# Patient Record
Sex: Female | Born: 1955
Health system: Southern US, Community
[De-identification: ages and names within clinical notes are randomized; demographics above are authoritative.]

## PROBLEM LIST (undated history)

## (undated) DIAGNOSIS — Z9289 Personal history of other medical treatment: Secondary | ICD-10-CM

## (undated) DIAGNOSIS — C541 Malignant neoplasm of endometrium: Secondary | ICD-10-CM

## (undated) DIAGNOSIS — G4730 Sleep apnea, unspecified: Secondary | ICD-10-CM

## (undated) DIAGNOSIS — T4145XA Adverse effect of unspecified anesthetic, initial encounter: Secondary | ICD-10-CM

## (undated) DIAGNOSIS — J45909 Unspecified asthma, uncomplicated: Secondary | ICD-10-CM

## (undated) DIAGNOSIS — I10 Essential (primary) hypertension: Secondary | ICD-10-CM

## (undated) DIAGNOSIS — R0600 Dyspnea, unspecified: Secondary | ICD-10-CM

## (undated) DIAGNOSIS — Z9889 Other specified postprocedural states: Secondary | ICD-10-CM

## (undated) DIAGNOSIS — C539 Malignant neoplasm of cervix uteri, unspecified: Secondary | ICD-10-CM

## (undated) DIAGNOSIS — K436 Other and unspecified ventral hernia with obstruction, without gangrene: Secondary | ICD-10-CM

## (undated) DIAGNOSIS — M8580 Other specified disorders of bone density and structure, unspecified site: Secondary | ICD-10-CM

## (undated) DIAGNOSIS — K429 Umbilical hernia without obstruction or gangrene: Secondary | ICD-10-CM

## (undated) DIAGNOSIS — R112 Nausea with vomiting, unspecified: Secondary | ICD-10-CM

## (undated) DIAGNOSIS — Z923 Personal history of irradiation: Secondary | ICD-10-CM

## (undated) DIAGNOSIS — D649 Anemia, unspecified: Secondary | ICD-10-CM

## (undated) DIAGNOSIS — I509 Heart failure, unspecified: Secondary | ICD-10-CM

## (undated) DIAGNOSIS — E669 Obesity, unspecified: Secondary | ICD-10-CM

## (undated) DIAGNOSIS — E039 Hypothyroidism, unspecified: Secondary | ICD-10-CM

## (undated) DIAGNOSIS — Z85850 Personal history of malignant neoplasm of thyroid: Secondary | ICD-10-CM

## (undated) DIAGNOSIS — E785 Hyperlipidemia, unspecified: Secondary | ICD-10-CM

## (undated) DIAGNOSIS — T8859XA Other complications of anesthesia, initial encounter: Secondary | ICD-10-CM

## (undated) DIAGNOSIS — N189 Chronic kidney disease, unspecified: Secondary | ICD-10-CM

## (undated) DIAGNOSIS — M1990 Unspecified osteoarthritis, unspecified site: Secondary | ICD-10-CM

## (undated) HISTORY — DX: Hyperlipidemia, unspecified: E78.5

## (undated) HISTORY — DX: Personal history of other medical treatment: Z92.89

## (undated) HISTORY — DX: Malignant neoplasm of endometrium: C54.1

## (undated) HISTORY — DX: Other specified disorders of bone density and structure, unspecified site: M85.80

## (undated) HISTORY — DX: Anemia, unspecified: D64.9

## (undated) HISTORY — PX: THYROID SURGERY: SHX805

## (undated) HISTORY — DX: Malignant neoplasm of cervix uteri, unspecified: C53.9

## (undated) HISTORY — DX: Personal history of irradiation: Z92.3

## (undated) HISTORY — DX: Personal history of malignant neoplasm of thyroid: Z85.850

## (undated) HISTORY — DX: Umbilical hernia without obstruction or gangrene: K42.9

## (undated) HISTORY — PX: BONE MARROW BIOPSY: SHX199

## (undated) HISTORY — DX: Obesity, unspecified: E66.9

---
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
## 2000-08-25 ENCOUNTER — Emergency Department (HOSPITAL_COMMUNITY): Admission: EM | Admit: 2000-08-25 | Discharge: 2000-08-26 | Payer: Self-pay | Admitting: Emergency Medicine

## 2000-08-25 ENCOUNTER — Encounter: Payer: Self-pay | Admitting: Emergency Medicine

## 2000-09-06 ENCOUNTER — Encounter: Admission: RE | Admit: 2000-09-06 | Discharge: 2000-09-06 | Payer: Self-pay | Admitting: Internal Medicine

## 2000-09-06 ENCOUNTER — Encounter: Payer: Self-pay | Admitting: Internal Medicine

## 2000-10-26 ENCOUNTER — Other Ambulatory Visit: Admission: RE | Admit: 2000-10-26 | Discharge: 2000-10-26 | Payer: Self-pay | Admitting: Obstetrics and Gynecology

## 2000-11-15 ENCOUNTER — Inpatient Hospital Stay (HOSPITAL_COMMUNITY): Admission: RE | Admit: 2000-11-15 | Discharge: 2000-11-17 | Payer: Self-pay | Admitting: General Surgery

## 2001-04-19 ENCOUNTER — Emergency Department (HOSPITAL_COMMUNITY): Admission: EM | Admit: 2001-04-19 | Discharge: 2001-04-19 | Payer: Self-pay | Admitting: Emergency Medicine

## 2001-04-19 ENCOUNTER — Encounter: Payer: Self-pay | Admitting: Emergency Medicine

## 2001-09-13 ENCOUNTER — Encounter: Payer: Self-pay | Admitting: Internal Medicine

## 2001-09-13 ENCOUNTER — Encounter: Admission: RE | Admit: 2001-09-13 | Discharge: 2001-09-13 | Payer: Self-pay | Admitting: Internal Medicine

## 2001-10-25 HISTORY — PX: CHOLECYSTECTOMY: SHX55

## 2002-01-22 ENCOUNTER — Emergency Department (HOSPITAL_COMMUNITY): Admission: EM | Admit: 2002-01-22 | Discharge: 2002-01-22 | Payer: Self-pay | Admitting: *Deleted

## 2002-09-18 ENCOUNTER — Encounter: Admission: RE | Admit: 2002-09-18 | Discharge: 2002-09-18 | Payer: Self-pay | Admitting: Internal Medicine

## 2002-09-18 ENCOUNTER — Encounter: Payer: Self-pay | Admitting: Internal Medicine

## 2002-10-25 DIAGNOSIS — I509 Heart failure, unspecified: Secondary | ICD-10-CM

## 2002-10-25 HISTORY — DX: Heart failure, unspecified: I50.9

## 2003-09-20 ENCOUNTER — Encounter: Admission: RE | Admit: 2003-09-20 | Discharge: 2003-09-20 | Payer: Self-pay | Admitting: Internal Medicine

## 2003-10-26 HISTORY — PX: CARDIAC CATHETERIZATION: SHX172

## 2003-12-20 ENCOUNTER — Emergency Department (HOSPITAL_COMMUNITY): Admission: EM | Admit: 2003-12-20 | Discharge: 2003-12-20 | Payer: Self-pay | Admitting: Family Medicine

## 2004-01-07 DIAGNOSIS — Z9289 Personal history of other medical treatment: Secondary | ICD-10-CM

## 2004-01-07 HISTORY — DX: Personal history of other medical treatment: Z92.89

## 2004-01-29 ENCOUNTER — Other Ambulatory Visit: Admission: RE | Admit: 2004-01-29 | Discharge: 2004-01-29 | Payer: Self-pay | Admitting: Diagnostic Radiology

## 2004-02-10 ENCOUNTER — Ambulatory Visit (HOSPITAL_COMMUNITY): Admission: RE | Admit: 2004-02-10 | Discharge: 2004-02-10 | Payer: Self-pay | Admitting: Gastroenterology

## 2004-02-10 ENCOUNTER — Encounter (INDEPENDENT_AMBULATORY_CARE_PROVIDER_SITE_OTHER): Payer: Self-pay | Admitting: Specialist

## 2004-02-12 ENCOUNTER — Encounter (INDEPENDENT_AMBULATORY_CARE_PROVIDER_SITE_OTHER): Payer: Self-pay | Admitting: Specialist

## 2004-02-12 ENCOUNTER — Ambulatory Visit (HOSPITAL_COMMUNITY): Admission: RE | Admit: 2004-02-12 | Discharge: 2004-02-12 | Payer: Self-pay | Admitting: Gastroenterology

## 2004-02-19 ENCOUNTER — Ambulatory Visit (HOSPITAL_COMMUNITY): Admission: RE | Admit: 2004-02-19 | Discharge: 2004-02-19 | Payer: Self-pay | Admitting: Cardiology

## 2004-04-08 ENCOUNTER — Observation Stay (HOSPITAL_COMMUNITY): Admission: RE | Admit: 2004-04-08 | Discharge: 2004-04-09 | Payer: Self-pay | Admitting: General Surgery

## 2004-04-08 ENCOUNTER — Encounter (INDEPENDENT_AMBULATORY_CARE_PROVIDER_SITE_OTHER): Payer: Self-pay | Admitting: Specialist

## 2004-09-29 ENCOUNTER — Encounter: Admission: RE | Admit: 2004-09-29 | Discharge: 2004-09-29 | Payer: Self-pay | Admitting: Family Medicine

## 2005-03-17 ENCOUNTER — Other Ambulatory Visit: Admission: RE | Admit: 2005-03-17 | Discharge: 2005-03-17 | Payer: Self-pay | Admitting: Family Medicine

## 2005-10-26 ENCOUNTER — Encounter: Admission: RE | Admit: 2005-10-26 | Discharge: 2005-10-26 | Payer: Self-pay | Admitting: Family Medicine

## 2006-04-15 ENCOUNTER — Ambulatory Visit: Payer: Self-pay | Admitting: Family Medicine

## 2006-04-22 ENCOUNTER — Ambulatory Visit: Payer: Self-pay | Admitting: Family Medicine

## 2006-06-22 ENCOUNTER — Ambulatory Visit: Payer: Self-pay | Admitting: Family Medicine

## 2006-08-24 ENCOUNTER — Ambulatory Visit: Payer: Self-pay | Admitting: Family Medicine

## 2006-09-05 ENCOUNTER — Ambulatory Visit: Payer: Self-pay | Admitting: Family Medicine

## 2006-10-12 ENCOUNTER — Ambulatory Visit: Payer: Self-pay | Admitting: Family Medicine

## 2006-10-25 HISTORY — PX: COLONOSCOPY: SHX174

## 2006-10-28 ENCOUNTER — Encounter: Admission: RE | Admit: 2006-10-28 | Discharge: 2006-10-28 | Payer: Self-pay | Admitting: Family Medicine

## 2006-11-07 ENCOUNTER — Ambulatory Visit: Payer: Self-pay | Admitting: Family Medicine

## 2006-11-07 ENCOUNTER — Encounter: Admission: RE | Admit: 2006-11-07 | Discharge: 2006-11-07 | Payer: Self-pay | Admitting: Family Medicine

## 2006-12-07 ENCOUNTER — Ambulatory Visit: Payer: Self-pay | Admitting: Family Medicine

## 2006-12-07 ENCOUNTER — Encounter: Admission: RE | Admit: 2006-12-07 | Discharge: 2006-12-07 | Payer: Self-pay | Admitting: Physician Assistant

## 2007-01-03 ENCOUNTER — Ambulatory Visit: Payer: Self-pay | Admitting: Family Medicine

## 2007-02-07 ENCOUNTER — Ambulatory Visit: Payer: Self-pay | Admitting: Family Medicine

## 2007-04-03 ENCOUNTER — Ambulatory Visit: Payer: Self-pay | Admitting: Family Medicine

## 2007-05-22 ENCOUNTER — Encounter: Admission: RE | Admit: 2007-05-22 | Discharge: 2007-05-22 | Payer: Self-pay | Admitting: Family Medicine

## 2007-06-07 ENCOUNTER — Ambulatory Visit: Payer: Self-pay | Admitting: Family Medicine

## 2007-07-10 ENCOUNTER — Ambulatory Visit: Payer: Self-pay | Admitting: Family Medicine

## 2007-07-14 ENCOUNTER — Ambulatory Visit: Payer: Self-pay | Admitting: Family Medicine

## 2007-07-24 ENCOUNTER — Ambulatory Visit: Payer: Self-pay | Admitting: Family Medicine

## 2007-08-16 ENCOUNTER — Ambulatory Visit: Payer: Self-pay | Admitting: Family Medicine

## 2007-10-17 ENCOUNTER — Ambulatory Visit: Payer: Self-pay | Admitting: Family Medicine

## 2007-10-26 HISTORY — PX: TOTAL THYROIDECTOMY: SHX2547

## 2007-11-06 ENCOUNTER — Encounter: Admission: RE | Admit: 2007-11-06 | Discharge: 2007-11-06 | Payer: Self-pay | Admitting: Family Medicine

## 2007-12-21 ENCOUNTER — Ambulatory Visit: Payer: Self-pay | Admitting: Family Medicine

## 2008-02-15 ENCOUNTER — Ambulatory Visit: Payer: Self-pay | Admitting: Family Medicine

## 2008-02-15 ENCOUNTER — Other Ambulatory Visit: Admission: RE | Admit: 2008-02-15 | Discharge: 2008-02-15 | Payer: Self-pay | Admitting: Family Medicine

## 2008-02-19 ENCOUNTER — Encounter: Admission: RE | Admit: 2008-02-19 | Discharge: 2008-02-19 | Payer: Self-pay | Admitting: Family Medicine

## 2008-02-27 ENCOUNTER — Encounter: Admission: RE | Admit: 2008-02-27 | Discharge: 2008-02-27 | Payer: Self-pay | Admitting: Family Medicine

## 2008-03-12 ENCOUNTER — Encounter: Admission: RE | Admit: 2008-03-12 | Discharge: 2008-03-12 | Payer: Self-pay | Admitting: Family Medicine

## 2008-03-12 ENCOUNTER — Other Ambulatory Visit: Admission: RE | Admit: 2008-03-12 | Discharge: 2008-03-12 | Payer: Self-pay | Admitting: Interventional Radiology

## 2008-03-12 ENCOUNTER — Encounter (INDEPENDENT_AMBULATORY_CARE_PROVIDER_SITE_OTHER): Payer: Self-pay | Admitting: Interventional Radiology

## 2008-03-14 ENCOUNTER — Ambulatory Visit: Payer: Self-pay | Admitting: Family Medicine

## 2008-04-10 ENCOUNTER — Ambulatory Visit: Payer: Self-pay | Admitting: Family Medicine

## 2008-04-24 ENCOUNTER — Ambulatory Visit: Payer: Self-pay | Admitting: Internal Medicine

## 2008-04-25 LAB — CBC & DIFF AND RETIC
BASO%: 0.2 % (ref 0.0–2.0)
Basophils Absolute: 0 10*3/uL (ref 0.0–0.1)
EOS%: 2.1 % (ref 0.0–7.0)
Eosinophils Absolute: 0.1 10*3/uL (ref 0.0–0.5)
HCT: 32.3 % — ABNORMAL LOW (ref 34.8–46.6)
HGB: 11 g/dL — ABNORMAL LOW (ref 11.6–15.9)
IRF: 0.46 — ABNORMAL HIGH (ref 0.130–0.330)
LYMPH%: 19.6 % (ref 14.0–48.0)
MCH: 30.6 pg (ref 26.0–34.0)
MCHC: 34.2 g/dL (ref 32.0–36.0)
MCV: 89.6 fL (ref 81.0–101.0)
MONO#: 0.4 10*3/uL (ref 0.1–0.9)
MONO%: 5.6 % (ref 0.0–13.0)
NEUT#: 5 10*3/uL (ref 1.5–6.5)
NEUT%: 72.5 % (ref 39.6–76.8)
Platelets: 234 10*3/uL (ref 145–400)
RBC: 3.6 10*6/uL — ABNORMAL LOW (ref 3.70–5.32)
RDW: 13.3 % (ref 11.3–14.5)
RETIC #: 54.7 10*3/uL (ref 19.7–115.1)
Retic %: 1.5 % (ref 0.4–2.3)
WBC: 6.9 10*3/uL (ref 3.9–10.0)
lymph#: 1.4 10*3/uL (ref 0.9–3.3)

## 2008-04-29 LAB — IRON AND TIBC
%SAT: 22 % (ref 20–55)
Iron: 59 ug/dL (ref 42–145)
TIBC: 266 ug/dL (ref 250–470)
UIBC: 207 ug/dL

## 2008-04-29 LAB — PROTEIN ELECTROPHORESIS, SERUM
Albumin ELP: 54.5 % — ABNORMAL LOW (ref 55.8–66.1)
Alpha-1-Globulin: 4.9 % (ref 2.9–4.9)
Alpha-2-Globulin: 11.5 % (ref 7.1–11.8)
Beta 2: 6.6 % — ABNORMAL HIGH (ref 3.2–6.5)
Beta Globulin: 5.6 % (ref 4.7–7.2)
Gamma Globulin: 16.9 % (ref 11.1–18.8)
Total Protein, Serum Electrophoresis: 7.5 g/dL (ref 6.0–8.3)

## 2008-04-29 LAB — FOLATE RBC: RBC Folate: 884 ng/mL — ABNORMAL HIGH (ref 180–600)

## 2008-04-29 LAB — FERRITIN: Ferritin: 118 ng/mL (ref 10–291)

## 2008-04-29 LAB — VITAMIN B12: Vitamin B-12: 412 pg/mL (ref 211–911)

## 2008-05-09 LAB — CBC WITH DIFFERENTIAL/PLATELET
BASO%: 0.2 % (ref 0.0–2.0)
Basophils Absolute: 0 10*3/uL (ref 0.0–0.1)
EOS%: 1.6 % (ref 0.0–7.0)
Eosinophils Absolute: 0.1 10*3/uL (ref 0.0–0.5)
HCT: 30.8 % — ABNORMAL LOW (ref 34.8–46.6)
HGB: 10.7 g/dL — ABNORMAL LOW (ref 11.6–15.9)
LYMPH%: 18.2 % (ref 14.0–48.0)
MCH: 30.7 pg (ref 26.0–34.0)
MCHC: 34.7 g/dL (ref 32.0–36.0)
MCV: 88.6 fL (ref 81.0–101.0)
MONO#: 0.4 10*3/uL (ref 0.1–0.9)
MONO%: 4.7 % (ref 0.0–13.0)
NEUT#: 6.4 10*3/uL (ref 1.5–6.5)
NEUT%: 75.3 % (ref 39.6–76.8)
Platelets: 207 10*3/uL (ref 145–400)
RBC: 3.48 10*6/uL — ABNORMAL LOW (ref 3.70–5.32)
RDW: 13.5 % (ref 11.3–14.5)
WBC: 8.5 10*3/uL (ref 3.9–10.0)
lymph#: 1.5 10*3/uL (ref 0.9–3.3)

## 2008-05-14 ENCOUNTER — Ambulatory Visit: Payer: Self-pay | Admitting: Family Medicine

## 2008-07-08 ENCOUNTER — Ambulatory Visit: Payer: Self-pay | Admitting: Internal Medicine

## 2008-07-10 LAB — CBC WITH DIFFERENTIAL/PLATELET
BASO%: 0.3 % (ref 0.0–2.0)
Basophils Absolute: 0 10*3/uL (ref 0.0–0.1)
EOS%: 2.6 % (ref 0.0–7.0)
Eosinophils Absolute: 0.2 10*3/uL (ref 0.0–0.5)
HCT: 32.8 % — ABNORMAL LOW (ref 34.8–46.6)
HGB: 11.3 g/dL — ABNORMAL LOW (ref 11.6–15.9)
LYMPH%: 19.1 % (ref 14.0–48.0)
MCH: 31.3 pg (ref 26.0–34.0)
MCHC: 34.5 g/dL (ref 32.0–36.0)
MCV: 90.9 fL (ref 81.0–101.0)
MONO#: 0.4 10*3/uL (ref 0.1–0.9)
MONO%: 4.8 % (ref 0.0–13.0)
NEUT#: 5.7 10*3/uL (ref 1.5–6.5)
NEUT%: 73.2 % (ref 39.6–76.8)
Platelets: 188 10*3/uL (ref 145–400)
RBC: 3.6 10*6/uL — ABNORMAL LOW (ref 3.70–5.32)
RDW: 13.4 % (ref 11.3–14.5)
WBC: 7.7 10*3/uL (ref 3.9–10.0)
lymph#: 1.5 10*3/uL (ref 0.9–3.3)

## 2008-07-12 ENCOUNTER — Encounter (INDEPENDENT_AMBULATORY_CARE_PROVIDER_SITE_OTHER): Payer: Self-pay | Admitting: General Surgery

## 2008-07-12 ENCOUNTER — Ambulatory Visit (HOSPITAL_COMMUNITY): Admission: RE | Admit: 2008-07-12 | Discharge: 2008-07-13 | Payer: Self-pay | Admitting: General Surgery

## 2008-09-04 ENCOUNTER — Ambulatory Visit: Payer: Self-pay | Admitting: Family Medicine

## 2008-10-01 ENCOUNTER — Ambulatory Visit: Payer: Self-pay | Admitting: Internal Medicine

## 2008-10-03 LAB — IRON AND TIBC
%SAT: 16 % — ABNORMAL LOW (ref 20–55)
Iron: 39 ug/dL — ABNORMAL LOW (ref 42–145)
TIBC: 246 ug/dL — ABNORMAL LOW (ref 250–470)
UIBC: 207 ug/dL

## 2008-10-03 LAB — CBC WITH DIFFERENTIAL/PLATELET
BASO%: 0.3 % (ref 0.0–2.0)
Basophils Absolute: 0 10*3/uL (ref 0.0–0.1)
EOS%: 1.7 % (ref 0.0–7.0)
Eosinophils Absolute: 0.1 10*3/uL (ref 0.0–0.5)
HCT: 31.6 % — ABNORMAL LOW (ref 34.8–46.6)
HGB: 10.7 g/dL — ABNORMAL LOW (ref 11.6–15.9)
LYMPH%: 20 % (ref 14.0–48.0)
MCH: 31.8 pg (ref 26.0–34.0)
MCHC: 33.9 g/dL (ref 32.0–36.0)
MCV: 93.8 fL (ref 81.0–101.0)
MONO#: 0.5 10*3/uL (ref 0.1–0.9)
MONO%: 5.5 % (ref 0.0–13.0)
NEUT#: 6.2 10*3/uL (ref 1.5–6.5)
NEUT%: 72.5 % (ref 39.6–76.8)
Platelets: 205 10*3/uL (ref 145–400)
RBC: 3.37 10*6/uL — ABNORMAL LOW (ref 3.70–5.32)
RDW: 13.8 % (ref 11.3–14.5)
WBC: 8.5 10*3/uL (ref 3.9–10.0)
lymph#: 1.7 10*3/uL (ref 0.9–3.3)

## 2008-10-03 LAB — FERRITIN: Ferritin: 198 ng/mL (ref 10–291)

## 2008-11-05 ENCOUNTER — Ambulatory Visit: Payer: Self-pay | Admitting: Family Medicine

## 2008-11-12 ENCOUNTER — Encounter: Admission: RE | Admit: 2008-11-12 | Discharge: 2008-11-12 | Payer: Self-pay | Admitting: Family Medicine

## 2009-01-02 ENCOUNTER — Ambulatory Visit: Payer: Self-pay | Admitting: Family Medicine

## 2009-01-03 ENCOUNTER — Ambulatory Visit: Payer: Self-pay | Admitting: Internal Medicine

## 2009-01-07 LAB — CBC WITH DIFFERENTIAL/PLATELET
BASO%: 0.2 % (ref 0.0–2.0)
Basophils Absolute: 0 10*3/uL (ref 0.0–0.1)
EOS%: 1.3 % (ref 0.0–7.0)
Eosinophils Absolute: 0.1 10*3/uL (ref 0.0–0.5)
HCT: 31.6 % — ABNORMAL LOW (ref 34.8–46.6)
HGB: 10.9 g/dL — ABNORMAL LOW (ref 11.6–15.9)
LYMPH%: 22 % (ref 14.0–49.7)
MCH: 32.2 pg (ref 25.1–34.0)
MCHC: 34.5 g/dL (ref 31.5–36.0)
MCV: 93.5 fL (ref 79.5–101.0)
MONO#: 0.4 10*3/uL (ref 0.1–0.9)
MONO%: 4.8 % (ref 0.0–14.0)
NEUT#: 5.6 10*3/uL (ref 1.5–6.5)
NEUT%: 71.7 % (ref 38.4–76.8)
Platelets: 225 10*3/uL (ref 145–400)
RBC: 3.38 10*6/uL — ABNORMAL LOW (ref 3.70–5.45)
RDW: 13.4 % (ref 11.2–14.5)
WBC: 7.8 10*3/uL (ref 3.9–10.3)
lymph#: 1.7 10*3/uL (ref 0.9–3.3)

## 2009-01-08 LAB — FERRITIN: Ferritin: 191 ng/mL (ref 10–291)

## 2009-01-08 LAB — FOLATE RBC: RBC Folate: 1548 ng/mL — ABNORMAL HIGH (ref 180–600)

## 2009-01-29 ENCOUNTER — Ambulatory Visit: Payer: Self-pay | Admitting: Family Medicine

## 2009-02-19 ENCOUNTER — Ambulatory Visit: Payer: Self-pay | Admitting: Family Medicine

## 2009-04-22 ENCOUNTER — Ambulatory Visit: Payer: Self-pay | Admitting: Family Medicine

## 2009-05-30 ENCOUNTER — Ambulatory Visit: Payer: Self-pay | Admitting: Internal Medicine

## 2009-07-07 ENCOUNTER — Ambulatory Visit: Payer: Self-pay | Admitting: Internal Medicine

## 2009-07-09 LAB — COMPREHENSIVE METABOLIC PANEL
ALT: 15 U/L (ref 0–35)
AST: 16 U/L (ref 0–37)
Albumin: 4.1 g/dL (ref 3.5–5.2)
Alkaline Phosphatase: 93 U/L (ref 39–117)
BUN: 17 mg/dL (ref 6–23)
CO2: 26 mEq/L (ref 19–32)
Calcium: 8.7 mg/dL (ref 8.4–10.5)
Chloride: 104 mEq/L (ref 96–112)
Creatinine, Ser: 0.94 mg/dL (ref 0.40–1.20)
Glucose, Bld: 108 mg/dL — ABNORMAL HIGH (ref 70–99)
Potassium: 4.4 mEq/L (ref 3.5–5.3)
Sodium: 140 mEq/L (ref 135–145)
Total Bilirubin: 0.4 mg/dL (ref 0.3–1.2)
Total Protein: 7.1 g/dL (ref 6.0–8.3)

## 2009-07-09 LAB — CBC WITH DIFFERENTIAL/PLATELET
BASO%: 0.2 % (ref 0.0–2.0)
Basophils Absolute: 0 10*3/uL (ref 0.0–0.1)
EOS%: 2.2 % (ref 0.0–7.0)
Eosinophils Absolute: 0.2 10*3/uL (ref 0.0–0.5)
HCT: 31.8 % — ABNORMAL LOW (ref 34.8–46.6)
HGB: 11 g/dL — ABNORMAL LOW (ref 11.6–15.9)
LYMPH%: 20.7 % (ref 14.0–49.7)
MCH: 32 pg (ref 25.1–34.0)
MCHC: 34.5 g/dL (ref 31.5–36.0)
MCV: 92.9 fL (ref 79.5–101.0)
MONO#: 0.4 10*3/uL (ref 0.1–0.9)
MONO%: 4.9 % (ref 0.0–14.0)
NEUT#: 5.6 10*3/uL (ref 1.5–6.5)
NEUT%: 72 % (ref 38.4–76.8)
Platelets: 230 10*3/uL (ref 145–400)
RBC: 3.42 10*6/uL — ABNORMAL LOW (ref 3.70–5.45)
RDW: 13.3 % (ref 11.2–14.5)
WBC: 7.7 10*3/uL (ref 3.9–10.3)
lymph#: 1.6 10*3/uL (ref 0.9–3.3)

## 2009-07-09 LAB — IRON AND TIBC
%SAT: 22 % (ref 20–55)
Iron: 57 ug/dL (ref 42–145)
TIBC: 256 ug/dL (ref 250–470)
UIBC: 199 ug/dL

## 2009-07-09 LAB — LACTATE DEHYDROGENASE: LDH: 212 U/L (ref 94–250)

## 2009-07-09 LAB — FERRITIN: Ferritin: 275 ng/mL (ref 10–291)

## 2009-07-23 ENCOUNTER — Ambulatory Visit: Payer: Self-pay | Admitting: Family Medicine

## 2009-07-30 ENCOUNTER — Ambulatory Visit (HOSPITAL_COMMUNITY): Admission: RE | Admit: 2009-07-30 | Discharge: 2009-07-30 | Payer: Self-pay | Admitting: Internal Medicine

## 2009-07-30 ENCOUNTER — Encounter (INDEPENDENT_AMBULATORY_CARE_PROVIDER_SITE_OTHER): Payer: Self-pay | Admitting: Interventional Radiology

## 2009-08-06 ENCOUNTER — Ambulatory Visit: Payer: Self-pay | Admitting: Internal Medicine

## 2009-08-06 LAB — CBC WITH DIFFERENTIAL/PLATELET
BASO%: 0.2 % (ref 0.0–2.0)
Basophils Absolute: 0 10*3/uL (ref 0.0–0.1)
EOS%: 1.7 % (ref 0.0–7.0)
Eosinophils Absolute: 0.1 10*3/uL (ref 0.0–0.5)
HCT: 29.9 % — ABNORMAL LOW (ref 34.8–46.6)
HGB: 10.5 g/dL — ABNORMAL LOW (ref 11.6–15.9)
LYMPH%: 21.2 % (ref 14.0–49.7)
MCH: 32.7 pg (ref 25.1–34.0)
MCHC: 35.2 g/dL (ref 31.5–36.0)
MCV: 92.9 fL (ref 79.5–101.0)
MONO#: 0.4 10*3/uL (ref 0.1–0.9)
MONO%: 5.5 % (ref 0.0–14.0)
NEUT#: 5.1 10*3/uL (ref 1.5–6.5)
NEUT%: 71.4 % (ref 38.4–76.8)
Platelets: 174 10*3/uL (ref 145–400)
RBC: 3.22 10*6/uL — ABNORMAL LOW (ref 3.70–5.45)
RDW: 12.9 % (ref 11.2–14.5)
WBC: 7.2 10*3/uL (ref 3.9–10.3)
lymph#: 1.5 10*3/uL (ref 0.9–3.3)

## 2009-11-13 ENCOUNTER — Encounter: Admission: RE | Admit: 2009-11-13 | Discharge: 2009-11-13 | Payer: Self-pay | Admitting: Family Medicine

## 2009-11-13 ENCOUNTER — Ambulatory Visit: Payer: Self-pay | Admitting: Family Medicine

## 2010-01-08 ENCOUNTER — Ambulatory Visit: Payer: Self-pay | Admitting: Family Medicine

## 2010-01-26 ENCOUNTER — Ambulatory Visit: Payer: Self-pay | Admitting: Internal Medicine

## 2010-01-28 LAB — CBC WITH DIFFERENTIAL/PLATELET
BASO%: 0.3 % (ref 0.0–2.0)
Basophils Absolute: 0 10*3/uL (ref 0.0–0.1)
EOS%: 2.4 % (ref 0.0–7.0)
Eosinophils Absolute: 0.2 10*3/uL (ref 0.0–0.5)
HCT: 32.8 % — ABNORMAL LOW (ref 34.8–46.6)
HGB: 11.4 g/dL — ABNORMAL LOW (ref 11.6–15.9)
LYMPH%: 20 % (ref 14.0–49.7)
MCH: 32.2 pg (ref 25.1–34.0)
MCHC: 34.6 g/dL (ref 31.5–36.0)
MCV: 93.2 fL (ref 79.5–101.0)
MONO#: 0.4 10*3/uL (ref 0.1–0.9)
MONO%: 4.9 % (ref 0.0–14.0)
NEUT#: 5.7 10*3/uL (ref 1.5–6.5)
NEUT%: 72.4 % (ref 38.4–76.8)
Platelets: 204 10*3/uL (ref 145–400)
RBC: 3.53 10*6/uL — ABNORMAL LOW (ref 3.70–5.45)
RDW: 13.6 % (ref 11.2–14.5)
WBC: 7.8 10*3/uL (ref 3.9–10.3)
lymph#: 1.6 10*3/uL (ref 0.9–3.3)

## 2010-01-29 LAB — ERYTHROPOIETIN: Erythropoietin: 7.9 m[IU]/mL (ref 2.6–34.0)

## 2010-01-29 LAB — COMPREHENSIVE METABOLIC PANEL
ALT: 13 U/L (ref 0–35)
AST: 14 U/L (ref 0–37)
Albumin: 4.3 g/dL (ref 3.5–5.2)
Alkaline Phosphatase: 93 U/L (ref 39–117)
BUN: 29 mg/dL — ABNORMAL HIGH (ref 6–23)
CO2: 24 mEq/L (ref 19–32)
Calcium: 9.4 mg/dL (ref 8.4–10.5)
Chloride: 103 mEq/L (ref 96–112)
Creatinine, Ser: 1.08 mg/dL (ref 0.40–1.20)
Glucose, Bld: 111 mg/dL — ABNORMAL HIGH (ref 70–99)
Potassium: 4.1 mEq/L (ref 3.5–5.3)
Sodium: 140 mEq/L (ref 135–145)
Total Bilirubin: 0.3 mg/dL (ref 0.3–1.2)
Total Protein: 7.2 g/dL (ref 6.0–8.3)

## 2010-01-29 LAB — IRON AND TIBC
%SAT: 21 % (ref 20–55)
Iron: 57 ug/dL (ref 42–145)
TIBC: 272 ug/dL (ref 250–470)
UIBC: 215 ug/dL

## 2010-01-29 LAB — FERRITIN: Ferritin: 211 ng/mL (ref 10–291)

## 2010-01-29 LAB — LACTATE DEHYDROGENASE: LDH: 185 U/L (ref 94–250)

## 2010-01-29 LAB — FOLATE: Folate: >20 ng/mL

## 2010-01-29 LAB — VITAMIN B12: Vitamin B-12: 403 pg/mL (ref 211–911)

## 2010-02-26 ENCOUNTER — Ambulatory Visit: Payer: Self-pay | Admitting: Physician Assistant

## 2010-07-28 ENCOUNTER — Ambulatory Visit: Payer: Self-pay | Admitting: Internal Medicine

## 2010-07-30 LAB — IRON AND TIBC
%SAT: 14 % — ABNORMAL LOW (ref 20–55)
Iron: 37 ug/dL — ABNORMAL LOW (ref 42–145)
TIBC: 261 ug/dL (ref 250–470)
UIBC: 224 ug/dL

## 2010-07-30 LAB — CBC & DIFF AND RETIC
BASO%: 0.2 % (ref 0.0–2.0)
Basophils Absolute: 0 10*3/uL (ref 0.0–0.1)
EOS%: 1.1 % (ref 0.0–7.0)
Eosinophils Absolute: 0.1 10*3/uL (ref 0.0–0.5)
HCT: 32 % — ABNORMAL LOW (ref 34.8–46.6)
HGB: 10.5 g/dL — ABNORMAL LOW (ref 11.6–15.9)
Immature Retic Fract: 13.8 % — ABNORMAL HIGH (ref 0.00–10.70)
LYMPH%: 22 % (ref 14.0–49.7)
MCH: 31.1 pg (ref 25.1–34.0)
MCHC: 32.8 g/dL (ref 31.5–36.0)
MCV: 94.7 fL (ref 79.5–101.0)
MONO#: 0.4 10*3/uL (ref 0.1–0.9)
MONO%: 5.5 % (ref 0.0–14.0)
NEUT#: 4.5 10*3/uL (ref 1.5–6.5)
NEUT%: 71.2 % (ref 38.4–76.8)
Platelets: 172 10*3/uL (ref 145–400)
RBC: 3.38 10*6/uL — ABNORMAL LOW (ref 3.70–5.45)
RDW: 13.6 % (ref 11.2–14.5)
Retic %: 1.62 % — ABNORMAL HIGH (ref 0.50–1.50)
Retic Ct Abs: 54.76 10*3/uL (ref 18.30–72.70)
WBC: 6.3 10*3/uL (ref 3.9–10.3)
lymph#: 1.4 10*3/uL (ref 0.9–3.3)

## 2010-07-30 LAB — FERRITIN: Ferritin: 138 ng/mL (ref 10–291)

## 2010-07-30 LAB — FOLATE: Folate: 14.6 ng/mL

## 2010-07-30 LAB — VITAMIN B12: Vitamin B-12: 380 pg/mL (ref 211–911)

## 2010-09-03 ENCOUNTER — Emergency Department (HOSPITAL_COMMUNITY): Admission: EM | Admit: 2010-09-03 | Discharge: 2010-09-03 | Payer: Self-pay | Admitting: Family Medicine

## 2010-11-15 ENCOUNTER — Encounter: Payer: Self-pay | Admitting: Family Medicine

## 2010-11-16 ENCOUNTER — Encounter
Admission: RE | Admit: 2010-11-16 | Discharge: 2010-11-16 | Payer: Self-pay | Source: Home / Self Care | Attending: Family Medicine | Admitting: Family Medicine

## 2010-12-10 ENCOUNTER — Ambulatory Visit (INDEPENDENT_AMBULATORY_CARE_PROVIDER_SITE_OTHER): Payer: BC Managed Care – PPO

## 2010-12-10 ENCOUNTER — Inpatient Hospital Stay (INDEPENDENT_AMBULATORY_CARE_PROVIDER_SITE_OTHER)
Admission: RE | Admit: 2010-12-10 | Discharge: 2010-12-10 | Disposition: A | Discharge status: Home / Self Care | Payer: BC Managed Care – PPO | Source: Ambulatory Visit | Attending: Family Medicine | Admitting: Family Medicine

## 2010-12-10 DIAGNOSIS — J189 Pneumonia, unspecified organism: Secondary | ICD-10-CM

## 2010-12-10 DIAGNOSIS — R062 Wheezing: Secondary | ICD-10-CM

## 2010-12-15 ENCOUNTER — Ambulatory Visit (INDEPENDENT_AMBULATORY_CARE_PROVIDER_SITE_OTHER): Payer: BC Managed Care – PPO | Admitting: Family Medicine

## 2010-12-15 DIAGNOSIS — J189 Pneumonia, unspecified organism: Secondary | ICD-10-CM

## 2011-01-27 ENCOUNTER — Other Ambulatory Visit: Payer: Self-pay | Admitting: Internal Medicine

## 2011-01-27 ENCOUNTER — Encounter (HOSPITAL_BASED_OUTPATIENT_CLINIC_OR_DEPARTMENT_OTHER): Payer: BC Managed Care – PPO | Admitting: Internal Medicine

## 2011-01-27 DIAGNOSIS — D509 Iron deficiency anemia, unspecified: Secondary | ICD-10-CM

## 2011-01-27 DIAGNOSIS — D649 Anemia, unspecified: Secondary | ICD-10-CM

## 2011-01-27 LAB — IRON AND TIBC
%SAT: 20 % (ref 20–55)
Iron: 51 ug/dL (ref 42–145)
TIBC: 260 ug/dL (ref 250–470)
UIBC: 209 ug/dL

## 2011-01-27 LAB — CBC & DIFF AND RETIC
BASO%: 0.2 % (ref 0.0–2.0)
Basophils Absolute: 0 10*3/uL (ref 0.0–0.1)
EOS%: 1.9 % (ref 0.0–7.0)
Eosinophils Absolute: 0.2 10*3/uL (ref 0.0–0.5)
HCT: 33.2 % — ABNORMAL LOW (ref 34.8–46.6)
HGB: 10.7 g/dL — ABNORMAL LOW (ref 11.6–15.9)
Immature Retic Fract: 9.1 % (ref 0.00–10.70)
LYMPH%: 18.1 % (ref 14.0–49.7)
MCH: 30.1 pg (ref 25.1–34.0)
MCHC: 32.2 g/dL (ref 31.5–36.0)
MCV: 93.3 fL (ref 79.5–101.0)
MONO#: 0.5 10*3/uL (ref 0.1–0.9)
MONO%: 5 % (ref 0.0–14.0)
NEUT#: 6.8 10*3/uL — ABNORMAL HIGH (ref 1.5–6.5)
NEUT%: 74.8 % (ref 38.4–76.8)
Platelets: 227 10*3/uL (ref 145–400)
RBC: 3.56 10*6/uL — ABNORMAL LOW (ref 3.70–5.45)
RDW: 13.2 % (ref 11.2–14.5)
Retic %: 1.33 % (ref 0.50–1.50)
Retic Ct Abs: 47.35 10*3/uL (ref 18.30–72.70)
WBC: 9.2 10*3/uL (ref 3.9–10.3)
lymph#: 1.7 10*3/uL (ref 0.9–3.3)
nRBC: 0 % (ref 0–0)

## 2011-01-27 LAB — FERRITIN: Ferritin: 242 ng/mL (ref 10–291)

## 2011-01-28 LAB — CBC
HCT: 33.8 % — ABNORMAL LOW (ref 36.0–46.0)
Hemoglobin: 11.6 g/dL — ABNORMAL LOW (ref 12.0–15.0)
MCHC: 34.3 g/dL (ref 30.0–36.0)
MCV: 93.3 fL (ref 78.0–100.0)
Platelets: 237 10*3/uL (ref 150–400)
RBC: 3.62 MIL/uL — ABNORMAL LOW (ref 3.87–5.11)
RDW: 12.6 % (ref 11.5–15.5)
WBC: 8.7 10*3/uL (ref 4.0–10.5)

## 2011-01-28 LAB — CHROMOSOME ANALYSIS, BONE MARROW

## 2011-01-28 LAB — APTT: aPTT: 29 seconds (ref 24–37)

## 2011-01-28 LAB — PROTIME-INR
INR: 0.96 (ref 0.00–1.49)
Prothrombin Time: 12.7 seconds (ref 11.6–15.2)

## 2011-01-28 LAB — BONE MARROW EXAM: Bone Marrow Exam: 352

## 2011-02-03 ENCOUNTER — Ambulatory Visit (INDEPENDENT_AMBULATORY_CARE_PROVIDER_SITE_OTHER): Payer: BC Managed Care – PPO | Admitting: Family Medicine

## 2011-02-03 ENCOUNTER — Encounter (HOSPITAL_BASED_OUTPATIENT_CLINIC_OR_DEPARTMENT_OTHER): Payer: BC Managed Care – PPO | Admitting: Internal Medicine

## 2011-02-03 DIAGNOSIS — L738 Other specified follicular disorders: Secondary | ICD-10-CM

## 2011-02-03 DIAGNOSIS — E039 Hypothyroidism, unspecified: Secondary | ICD-10-CM

## 2011-02-03 DIAGNOSIS — R059 Cough, unspecified: Secondary | ICD-10-CM

## 2011-02-03 DIAGNOSIS — R05 Cough: Secondary | ICD-10-CM

## 2011-02-03 DIAGNOSIS — Z79899 Other long term (current) drug therapy: Secondary | ICD-10-CM

## 2011-02-03 DIAGNOSIS — D509 Iron deficiency anemia, unspecified: Secondary | ICD-10-CM

## 2011-03-09 NOTE — Op Note (Signed)
NAME:  Bridget Lamb, Bridget Lamb           ACCOUNT NO.:  [id number]   MEDICAL RECORD NO.:  [id number]          PATIENT TYPE:  OIB   LOCATION:  5158                         FACILITY:  [facility]   PHYSICIAN:  Ollen Gross. Vernell Morgans, M.D. DATE OF BIRTH:  Feb 24, 1956   DATE OF PROCEDURE:  07/12/2008  DATE OF DISCHARGE:                               OPERATIVE REPORT   PREOPERATIVE DIAGNOSIS:  Left thyroid nodule.   POSTOPERATIVE DIAGNOSIS:  Left thyroid nodule.   PROCEDURE:  Left thyroid lobectomy.   SURGEON:  Ollen Gross. Vernell Morgans, MD   ASSISTANT:  Velora Heckler, MD   ANESTHESIA:  General endotracheal.   PROCEDURE:  After informed consent was obtained, the patient was brought  to the operating room and placed in a supine position on the operating  room table.  After adequate induction of general anesthesia, a roll was  placed behind the patient's shoulders to extend the neck slightly.  The  neck was then prepped with Betadine and draped in usual sterile manner.  A transversely oriented incision was made through her old surgical  incision scar with a 15 blade knife.  This incision was carried down  through the skin and subcutaneous tissue sharply with the electrocautery  and through the platysma as well with the electrocautery.  Once this was  accomplished, the platysma was grasped with Allis clamps superiorly and  inferiorly and subplatysmal flaps were created by blunt finger  dissection and some sharp dissection with the electrocautery.  A  Mahorner retractor was then deployed.  Dissection was then carried  sharply with the electrocautery down the midline until the trachea was  encountered.  The strap muscles were gently retracted laterally and  blunt Kitner dissection was used to separate the strap muscles from the  anterior surface of the thyroid gland.  Once this was accomplished, we  were able to begin to rotate the thyroid gland, the left thyroid lobe  medially and anteriorly.  As we were able to  do this, several small  vessels on the lateral surface were controlled with small vascular  clips.  The superior pole vessels were also controlled with 2-0 silk  ties and a medium vascular clip.  Once this part of the dissection was  complete, we were then able to roll the left thyroid lobe even more  medially and anteriorly.  We were able to identify the parathyroid  glands and the recurrent laryngeal nerve and keep the dissection away  from that.  Once the gland was rolled up, we were able to separate the  gland from the anterior surface of the trachea sharply with the  electrocautery.  Once this was accomplished, the gland was completely  separated from the rest of the neck structures and was able to be  removed.  A stitch was placed on the superior pole of the gland.  It was  sent to pathology for further evaluation.  The wound was irrigated with  copious amounts of saline.  The operative bed appeared to be hemostatic.  Couple of small pieces of Surgicel were then placed at the operative  bed.  The  strap muscles were then reapproximated with interrupted 4-0  Vicryl figure-of-eight stitches.  The platysma was also reapproximated  with interrupted 4-0 Vicryl stitches and the skin was closed with a  running 4-0 Monocryl subcuticular stitch.  Dermabond dressing was  applied.  The patient tolerated the procedure well.  At the end of the  case, all needle, sponge and instrument counts were correct.  The  patient was then awakened and taken to recovery room in stable  condition.      Ollen Gross. Vernell Morgans, M.D.  Electronically Signed     PST/MEDQ  D:  07/12/2008  T:  07/13/2008  Job:  [id number]

## 2011-03-12 NOTE — Op Note (Signed)
NAME:  Cromwell, CARMACK                     ACCOUNT NO.:  [id number]   MEDICAL RECORD NO.:  [id number]                   PATIENT TYPE:  AMB   LOCATION:  ENDO                                 FACILITY:  [facility]   PHYSICIAN:  Anselmo Rod, M.D.               DATE OF BIRTH:  1956/10/17   DATE OF PROCEDURE:  02/10/2004  DATE OF DISCHARGE:                                 OPERATIVE REPORT   PROCEDURE PERFORMED:  Esophagogastroduodenoscopy with biopsies.   ENDOSCOPIST:  Anselmo Rod, M.D.   INSTRUMENT USED:  Olympus video panendoscope.   INDICATION FOR PROCEDURE:  A 55 year old white female with a history of iron-  deficiency anemia, hemoglobin down to 9.3 g/dl, undergoing an EGD.  Rule out  peptic ulcer disease, esophagitis, etc.   PREPROCEDURE PREPARATION:  Informed consent was procured from the patient.  The patient fasted for eight hours prior to the procedure.   PREPROCEDURE PHYSICAL:  VITAL SIGNS:  The patient had stable vital signs.  NECK:  Supple.  CHEST:  Clear to auscultation.  S1, S2 regular.  ABDOMEN:  Obese, nontender, with normal bowel sounds.   DESCRIPTION OF PROCEDURE:  The patient was placed in the left lateral  decubitus position and sedated with 60 mg of Demerol and 6 mg of Versed  intravenously.  Once the patient was adequately sedate and maintained on low-  flow oxygen and continuous cardiac monitoring, the Olympus video  panendoscope was advanced through the mouthpiece, over the tongue, into the  esophagus under direct vision.  The entire esophagus appeared normal with no  evidence of ring, stricture, masses, esophagitis, or Barrett's mucosa.  The  scope was then advanced into the stomach.  Prominent nodular changes were  noticed in the antral mucosa.  Biopsies were done to rule out present of H.  pylori by pathology.  The proximal small bowel appeared normal.  Random  small bowel biopsies were also done to rule out sprue in view of the fact  that the  patient has iron-deficiency anemia.  There was no outlet  obstruction.  Retroflexion in the high cardia revealed no abnormalities.   IMPRESSION:  1. Normal-appearing esophagus and small bowel, small bowel biopsies done to     rule out sprue.  2. Antral nodularity, biopsies done to rule out Helicobacter pylori.   RECOMMENDATIONS:  1. Await pathology results.  2. Continue iron supplements.  3. Proceed with a colonoscopy at this time.  Further recommendations made     after the colonoscopy has been done.  The patient will be treated with     antibiotics if she has H. pylori on biopsies.                                               Anselmo Rod, M.D.  JNM/MEDQ  D:  02/10/2004  T:  02/10/2004  Job:  [id number]   cc:   Sharlot Gowda, M.D.  [address]  [place], [place] [address]  Fax: [phone]

## 2011-03-12 NOTE — Op Note (Signed)
NAME:  Bridget Lamb, Bridget Lamb                     ACCOUNT NO.:  [id number]   MEDICAL RECORD NO.:  [id number]                   PATIENT TYPE:  AMB   LOCATION:  ENDO                                 FACILITY:  [facility]   PHYSICIAN:  Anselmo Rod, M.D.               DATE OF BIRTH:  [birth year]   DATE OF PROCEDURE:  02/12/2004  DATE OF DISCHARGE:                                 OPERATIVE REPORT   PROCEDURE PERFORMED:  Colonoscopy with biopsies x4.   ENDOSCOPIST:  Anselmo Rod, M.D.   INSTRUMENT USED:  Olympus video colonoscope.   INDICATION FOR PROCEDURE:  A 55 year old white female with a history of  severe anemia, hemoglobin down to 9.3 g/dl.  Rule out colonic polyps,  masses, etc.   PREPROCEDURE PREPARATION:  Informed consent was procured from the patient.  The patient was fasted for eight hours prior to the procedure and prepped  with a bottle of magnesium citrate and a gallon of GoLYTELY the night prior  to   Dictation ended at this point.                                               Anselmo Rod, M.D.    JNM/MEDQ  D:  02/12/2004  T:  02/13/2004  Job:  [id number]   cc:   Sharlot Gowda, M.D.  [address]  [place], [place] [address]  Fax: [phone]

## 2011-03-12 NOTE — Op Note (Signed)
NAME:  Bridget Lamb, Bridget Lamb                     ACCOUNT NO.:  [id number]   MEDICAL RECORD NO.:  [id number]                   PATIENT TYPE:  AMB   LOCATION:  ENDO                                 FACILITY:  [facility]   PHYSICIAN:  Anselmo Rod, M.D.               DATE OF BIRTH:  Feb 21, 1956   DATE OF PROCEDURE:  02/10/2004  DATE OF DISCHARGE:                                 OPERATIVE REPORT   PROCEDURE PERFORMED:  Colonoscopy changed to flexible sigmoidoscopy.   ENDOSCOPIST:  Charna Elizabeth, M.D.   INSTRUMENT USED:  Olympus video colonoscope.   INDICATIONS FOR PROCEDURE:  The patient is a 55 year old white female with a  history of iron deficiency anemia.  Rule out colonic polyps, masses, etc.   PREPROCEDURE PREPARATION:  Informed consent was procured from the patient.  The patient was fasted for eight hours prior to the procedure and prepped  with a bottle of magnesium citrate and a gallon of GoLYTELY the night prior  to the procedure.   PREPROCEDURE PHYSICAL:  The patient had stable vital signs.  Neck supple.  Chest clear to auscultation.  Abdomen soft, obese with normal bowel sounds.   DESCRIPTION OF PROCEDURE:  The patient was placed in left lateral decubitus  position and sedated with Demerol and Versed intravenously.  No additional  sedation was used for the colonoscopy.  Once the patient was adequately  sedated and maintained on low flow oxygen and continuous cardiac monitoring,  the Olympus video colonoscope was advanced from the rectum to about 20 cm  with difficulty.  There was solid stool in the rectosigmoid colon and  rectum.  Procedure was aborted at this point with plans to reprep the  patient and redo the procedure at a later date.   IMPRESSION:  Large amount of residual stool in the rectum and rectosigmoid  colon.   RECOMMENDATIONS:  Reprep and redo the procedure at a later date.  She is to  go by my office and get instructions for the procedure again so that she can  be on a liquid diet for two days prior to her next colonoscopy.                                               Anselmo Rod, M.D.    JNM/MEDQ  D:  02/10/2004  T:  02/10/2004  Job:  [id number]   cc:   Sharlot Gowda, M.D.  [address]  [place], [place] [address]  Fax: [phone]

## 2011-03-12 NOTE — Op Note (Signed)
[facility]  Patient:    Bridget Lamb, Bridget Lamb                    MRN: [id number] Proc. Date: 11/14/00 Adm. Date:  [id number] Attending:  Chevis Pretty S                           Operative Report  PREOPERATIVE DIAGNOSIS:  Acute calculus cholecystitis.  POSTOPERATIVE DIAGNOSIS:  Acute calculus cholecystitis with cystic duct obstruction.  PROCEDURE:  Attempted laparoscopic and subsequent open cholecystectomy.  SURGEON:  Ollen Gross. Carolynne Edouard, M.D.  ASSISTANT:  Velora Heckler, M.D.  ANESTHESIA:  General endotracheal.  PROCEDURE:  After informed consent was obtained, the patient was brought to the operating room and placed in a supine position on the operating room table.  After adequate induction of general anesthesia, the patients abdomen was prepped with Betadine and draped in the usual sterile manner.  A small transverse supraumbilical incision was made with a 15-blade knife.  This incision was carried down through the subcutaneous tissue bluntly with a Kelly clamp and Army/Navy retractors until the linea alba was identified.  The linea alba was incised with a 15-blade knife and each side was grasped between Kocher clamps and elevated.  The preperitoneal space was then probed bluntly with a hemostat until the peritoneum was opened bluntly and access was gained into the abdominal cavity.  A finger was inserted through this hole and the anterior abdominal wall was palpated and no apparent adhesions could be felt. A 0 Vicryl purse-string stitch was then placed through the fascia around this hole and a Hasson cannula was placed through this hole and anchored with the previously placed Vicryl stitch.  The abdomen was then insufflated with carbon dioxide and a laparoscope was placed through the Hasson cannula.  An upper midline small transverse incision was made with a 15-blade knife and a 10 mm port was placed through this incision and bluntly into the abdominal  cavity under direct using the laparoscope.  Two small incisions were made laterally on the right side of the abdomen below the costal margin with the 15-blade knife and two 5 mm ports were placed bluntly through these incisions into the abdominal cavity, again under direct vision using the laparoscope.  Blunt retractors were placed through the two lateral 5 mm ports and a [place] dissector was placed through the upper midline port.  The patient was placed in reverse Trendelenburg position, rotated a little bit with the left side down.  The gallbladder was identified, but was densely adherent to the stomach, duodenum and colon, but it appeared that there was a large stone at the gallbladder neck that the stomach and duodenum were stuck to and this area could not be well-visualized laparoscopically.  At this point, the laparoscopic portion of the procedure was aborted.  The ports were removed under direct vision and were hemostatic.  A right subcostal incision was then made with a 10-blade knife.  This incision was carried down through the skin and subcutaneous tissues with the Bovie electrocautery.  The fascia and muscular layers of the abdominal wall were also divided with the Bovie electrocautery until access was gained to the abdominal cavity.  The gallbladder was identified.  A Thompson retractor was anchored to the bed and the retractors were placed to hold the stomach, duodenum and liver edge out of the way so that dissection could be carried out.  Peritoneal reflection  at the dome of the gallbladder was opened with the Bovie electrocautery and blunt dissection with a tonsil clamp and finger dissection was used to separate the gallbladder wall from the liver bed.  The tissue was very inflamed and at times broke apart.  The gallbladder lumen was entered and it was obvious that a very large, probably 2.5 cm in diameter gallstone was densely impacted into the gallbladder outlet area.   This made the gallbladder neck and cystic duct area somewhat necrotic and actual cystic duct was unable to be identified.  It was then felt that further dissection in this area would be at high risk for damaging the common duct.  There was no back leakage of bile from the neck area of the gallbladder.  The wound was examined and found to be hemostatic. The abdomen was then irrigated with copious amounts of warm saline.  A 19-French Blake drain was placed in the liver bed in case there was any leakage of bile once inflammation began to subside.  This 19-French Harrison Mons drain was brought out through the lateral-most 5 mm port laterally on the right side of the abdomen and anchored in place with a 3-0 nylon suture.  The fascial layers of the abdomen were then closed with two layers of running #1 PDS suture.  Again, the wound was irrigated with copious amounts of saline and the skin was closed with staples.  The patient tolerated the procedure well. At the end of the case, all needle, sponge and instrument counts were correct. The patient was awakened and taken to recovery room in stable condition. DD:  11/17/00 TD:  11/17/00 Job: [id number]

## 2011-03-12 NOTE — Op Note (Signed)
NAME:  Bridget Lamb, Bridget Lamb                     ACCOUNT NO.:  [id number]   MEDICAL RECORD NO.:  [id number]                   PATIENT TYPE:  OBV   LOCATION:  0343                                 FACILITY:  [facility]   PHYSICIAN:  Ollen Gross. Vernell Morgans, M.D.              DATE OF BIRTH:  1956/02/08   DATE OF PROCEDURE:  04/08/2004  DATE OF DISCHARGE:  04/09/2004                                 OPERATIVE REPORT   PREOPERATIVE DIAGNOSIS:  Right thyroid nodule.   POSTOPERATIVE DIAGNOSIS:  Right thyroid nodule.   PROCEDURE:  Right thyroid lobectomy.   SURGEON:  Dr. Carolynne Edouard   ASSISTANT:  Dr. Maryagnes Amos   ANESTHESIA:  General endotracheal.   DESCRIPTION OF PROCEDURE:  After informed consent was obtained, the patient  was brought to the operating room and placed in a supine position on the  operating room table.  After adequate induction of general anesthesia, the  patient was placed with her neck extended a little bit with a roll behind  her shoulders.  Her neck was then prepped with Betadine and draped in the  usual sterile manner.  A low transverse cervical incision was made with a 15  blade knife.  This incision was carried down through the skin, subcutaneous  tissue, and platysma sharply with the electrocautery.  Subplatysmal flaps  were then created superiorly and inferiorly with blunt finger dissection as  well as sharp Bovie dissection.  Next, a Mahorner retractor was placed to  retract the flaps superiorly and inferiorly.  Dissection was then carried  down along the midline, avoiding the strap muscles and small veins until the  trachea was identified.  The isthmus of the thyroid was also identified.  The strap muscles on the right were gently swept off the thyroid gland using  a Kitner.  An Army-Navy retractor was then used to retract the strap muscles  laterally.  Blunt dissection was carried out with the Kitner on top of the  thyroid gland to clear the strap muscles away.  The right thyroid  gland was  then retracted medially and rolled anteriorly with a finger and sponge.  Blunt right angle dissection was used to separate some of the filmy tissue  from the side of the thyroid gland.  Small veins on the lateral aspect of  the thyroid were controlled with small vascular clips and divided.  The  superior pole of the thyroid was then mobilized by blunt dissection with a  right-angle clamp until the superior pole vessels were surrounded, and 2-0  silk ties were passed around the superior pole vessels and tied down.  A  medium vascular clip was then also placed above the superior silk tie, and  the superior pole was divided.  This allowed good mobilization of the  thyroid gland superiorly.  The inferior thyroid artery was also identified  and controlled with small clips and divided.  This allowed the right thyroid  lobe to  be rolled up even farther medially and anteriorly.  Posteriorly,  both the parathyroid glands and the inferior laryngeal nerve were  identified, and dissection was kept away from these structures, and they  were preserved.  A small lobule of the thyroid gland projected very close to  the recurrent laryngeal nerve, and the thyroid gland was divided using the  electrocautery to leave a small remnant of this behind in order to stay away  from dissecting near the recurrent laryngeal nerve.  Once this was  accomplished, then the right thyroid lobe was able to be dissected away from  the trachea by sharp Bovie dissection.  The thyroid isthmus was also  mobilized in the same way.  A hemostat was then placed at the edge of the  thyroid isthmus, and the thyroid isthmus was divided sharply with a knife.  The isthmus of the thyroid was controlled with 4-0 Vicryl U stitches and  electrocautery.  The specimen was then sent to pathology for further  evaluation.  The bed where the right thyroid lobe had been was examined and  found to be hemostatic.  The parathyroids and nerve  were again identified  and appeared to be in good condition.  The wound was irrigated with saline.  A small piece of Surgicel was placed in the bed where the thyroid had been.  The left lobe of the thyroid was palpated and was pretty normal in size with  no large palpable nodules.  The frozen section was indeterminate and, at  this point, it was decided to not dissect the left lobe of the thyroid.  The  strap muscles were reapproximated using interrupted 4-0 Vicryl figure-of-  eight stitches.  The platysma was reapproximated with interrupted 4-0 Vicryl  stitches, and the skin was closed with a running 4-0 Monocryl subcuticular  stitch.  Benzoin and Steri-Strips and sterile dressings were applied.  The  patient tolerated the procedure well.  At the end of the case, all needle,  sponge, and instrument counts were correct.  The patient was then awakened  and taken to the recovery room in stable condition.                                               Ollen Gross. Vernell Morgans, M.D.    PST/MEDQ  D:  04/13/2004  T:  04/13/2004  Job:  [id number]

## 2011-03-12 NOTE — Op Note (Signed)
NAME:  Bridget Lamb, Bridget Lamb                     ACCOUNT NO.:  [id number]   MEDICAL RECORD NO.:  [id number]                   PATIENT TYPE:  AMB   LOCATION:  ENDO                                 FACILITY:  [facility]   PHYSICIAN:  Anselmo Rod, M.D.               DATE OF BIRTH:  [birth year]   DATE OF PROCEDURE:  02/12/2004  DATE OF DISCHARGE:                                 OPERATIVE REPORT   PROCEDURE PERFORMED:  Colonoscopy with biopsies x4.   ENDOSCOPIST:  Anselmo Rod, M.D.   INSTRUMENT USED:  Olympus video colonoscope.   INDICATION FOR PROCEDURE:  A 55 year old white female with a history of iron-  deficiency anemia.  Rule out colonic polyps, masses, etc.   PREPROCEDURE PREPARATION:  Informed consent was procured from the patient.  The patient was fasted for eight hours prior to the procedure and prepped  with a bottle of magnesium citrate and a gallon of GoLYTELY the night prior  to the procedure.  She did a prep for the second time and maintained herself  on clear liquids two days prior to the procedure, as the initial prep was  inadequate.  The patient was advised to stop all nonsteroidals five days  prior to the procedure.   PREPROCEDURE PHYSICAL:  VITAL SIGNS:  The patient had stable vital signs.  NECK:  Supple.  CHEST:  Clear to auscultation.  S1, S2 regular.  ABDOMEN:  Soft with normal bowel sounds.   DESCRIPTION OF PROCEDURE:  The patient was placed in the left lateral  decubitus position and sedated with 80 mg of Demerol and 6 mg of Versed  intravenously.  Once the patient was adequately sedate and maintained on low-  flow oxygen and continuous cardiac monitoring, the Olympus video colonoscope  was advanced from the rectum to the cecum.  Two small sessile polyps were  biopsied from the proximal right colon.  There was some residual stool in  the colon.  The patient's position was changed from the left lateral to the  supine and the right lateral position to reach  the cecal base with gentle  application of abdominal pressure.  Multiple washes were done.  Small  internal hemorrhoids were seen on retroflexion.  No large masses were  identified.  There was no evidence of diverticulosis.   IMPRESSION:  1. Two small sessile polyps biopsied from proximal right colon.  2. Small internal hemorrhoids.  3. Large amount of residual stool in the colon.  Small lesions could have     been missed.   RECOMMENDATIONS:  1. Await pathology results.  2. Repeat CRC screening depending on pathology results.  3. Outpatient follow-up in the next two weeks or earlier if need be.  4. Resume iron supplements as before.  Anselmo Rod, M.D.    JNM/MEDQ  D:  02/12/2004  T:  02/13/2004  Job:  [id number]   cc:   Sharlot Gowda, M.D.  [address]  [place], [place] [address]  Fax: [phone]

## 2011-03-12 NOTE — Cardiovascular Report (Signed)
NAME:  Bridget Lamb, Bridget Lamb                     ACCOUNT NO.:  [id number]   MEDICAL RECORD NO.:  [id number]                   PATIENT TYPE:  OIB   LOCATION:  2899                                 FACILITY:  [facility]   PHYSICIAN:  Thereasa Solo. Little, M.D.              DATE OF BIRTH:  08-30-1956   DATE OF PROCEDURE:  02/19/2004  DATE OF DISCHARGE:                              CARDIAC CATHETERIZATION   INDICATION FOR TEST:  This 55 year old female had an unexplainable episode  of congestive heart failure in February 2005.  An echocardiogram showed a  diminished ejection fraction and a nuclear study performed March 15 showed  normal LV systolic function but anterior ischemia.  As part of her workup,  she was noted to be anemic and have thyroid abnormalities.  She currently  has a thyroid nodule that needs to be excised.  She has had colonoscopy and  polyps removed, and she is currently being considered for thyroidectomy.  Because of this, we are proceeding on with a cardiac catheterization for  evaluation of her abnormal nuclear study.   PROCEDURE:  The patient was prepped and draped in the usual sterile fashion,  exposing the right groin.  Following local anesthetic with 1% Xylocaine, a  Smart needle was used to enter the right femoral artery.  A 5 French  introducer sheath was placed.  Left and right coronary arteriography and  ventriculography in the RAO projection were performed.   EQUIPMENT:  Five French Judkins configuration catheters.   TOTAL CONTRAST:  105 mL.   RESULTS:   HEMODYNAMIC MONITORING:  1. Central aortic pressure 172/86.  2. Left ventricular pressure 172/18.  3. No aortic valve gradient noted at the time of pullback.   VENTRICULOGRAPHY:  Ventriculography in the RAO projection revealed normal LV  systolic function, ejection fraction of greater than 60%.  The left  ventricular end-diastolic pressure was 25.  No significant mitral  regurgitation was noted.  There was  ventricular ectopy.   CORONARY ARTERIOGRAPHY:  1. Left main was long, normal, and bifurcated.  2. LAD:  The LAD extended down to the apex of the heart.  It gave rise to a     very large first diagonal branch and a smaller second diagonal branch.     There was mild tortuosity, but there was no significant coronary disease.  3. Circumflex:  The circumflex gave rise to a very large proximal OM-1 and a     smaller proximal mid-OM-2.  The small distal portion of the circumflex     ended up being an OM-3.  This system was free of disease.  4. Right coronary artery:  The right coronary artery was a dominant vessel     of greater than 4 mm in diameter.  It supplied a PDA and a posterolateral     branch, all of which were free of disease.   CONCLUSION:  1. Normal left ventricular systolic function.  2.  Elevated left ventricular end-diastolic pressure at 25.  3. No significant coronary disease.   I cannot explain the nuclear study based on her coronary anatomy.  It may be  that it is a false positive study because of her breast attenuation.  She  clearly does not have left ventricular dysfunction, and I see no reason she  cannot safely undergo thyroidectomy from a cardiac standpoint.                                               Thereasa Solo. Little, M.D.    ABL/MEDQ  D:  02/19/2004  T:  02/19/2004  Job:  [id number]   cc:   Catheterization Lab   Sharlot Gowda, M.D.  [address]  [place], [place] [address]  Fax: [phone]

## 2011-03-25 ENCOUNTER — Other Ambulatory Visit: Payer: Self-pay

## 2011-03-25 MED ORDER — LEVOTHYROXINE SODIUM 125 MCG PO CAPS: 125.0000 ug | ORAL_CAPSULE | Freq: Every day | ORAL | Status: DC

## 2011-03-25 MED ORDER — LISINOPRIL-HYDROCHLOROTHIAZIDE 20-25 MG PO TABS: 1.0000 | ORAL_TABLET | Freq: Every day | ORAL | Status: DC

## 2011-03-25 MED ORDER — NIFEDIPINE ER OSMOTIC RELEASE 30 MG PO TB24: 30.0000 mg | ORAL_TABLET | Freq: Every day | ORAL | Status: DC

## 2011-04-28 ENCOUNTER — Inpatient Hospital Stay (INDEPENDENT_AMBULATORY_CARE_PROVIDER_SITE_OTHER)
Admission: RE | Admit: 2011-04-28 | Discharge: 2011-04-28 | Disposition: A | Discharge status: Home / Self Care | Payer: BC Managed Care – PPO | Source: Ambulatory Visit | Attending: Emergency Medicine | Admitting: Emergency Medicine

## 2011-04-28 DIAGNOSIS — M869 Osteomyelitis, unspecified: Secondary | ICD-10-CM

## 2011-05-01 LAB — CULTURE, ROUTINE-ABSCESS
Culture: NO GROWTH
Gram Stain: NONE SEEN

## 2011-05-29 ENCOUNTER — Other Ambulatory Visit: Payer: Self-pay | Admitting: Family Medicine

## 2011-07-26 LAB — BASIC METABOLIC PANEL
BUN: 16
CO2: 31
Calcium: 10.3
Chloride: 101
Creatinine, Ser: 0.88
GFR calc Af Amer: >60
GFR calc non Af Amer: >60
Glucose, Bld: 97
Potassium: 4.7
Sodium: 140

## 2011-07-26 LAB — DIFFERENTIAL
Basophils Absolute: 0
Basophils Relative: 1
Eosinophils Absolute: 0.2
Eosinophils Relative: 2
Lymphocytes Relative: 20
Lymphs Abs: 1.7
Monocytes Absolute: 0.4
Monocytes Relative: 5
Neutro Abs: 6.2
Neutrophils Relative %: 72

## 2011-07-26 LAB — CBC
HCT: 35 — ABNORMAL LOW
Hemoglobin: 11.8 — ABNORMAL LOW
MCHC: 33.7
MCV: 91.3
Platelets: 199
RBC: 3.83 — ABNORMAL LOW
RDW: 13.8
WBC: 8.5

## 2011-07-26 LAB — CALCIUM
Calcium: 8.8
Calcium: 9.2

## 2011-09-01 ENCOUNTER — Other Ambulatory Visit: Payer: Self-pay | Admitting: Family Medicine

## 2011-09-01 NOTE — Telephone Encounter (Signed)
Needs ov

## 2011-10-09 ENCOUNTER — Other Ambulatory Visit: Payer: Self-pay | Admitting: Family Medicine

## 2011-10-22 ENCOUNTER — Emergency Department (INDEPENDENT_AMBULATORY_CARE_PROVIDER_SITE_OTHER)
Admission: EM | Admit: 2011-10-22 | Discharge: 2011-10-22 | Disposition: A | Discharge status: Home / Self Care | Payer: BC Managed Care – PPO | Source: Home / Self Care | Attending: Family Medicine | Admitting: Family Medicine

## 2011-10-22 ENCOUNTER — Encounter: Payer: Self-pay | Admitting: *Deleted

## 2011-10-22 DIAGNOSIS — M25562 Pain in left knee: Secondary | ICD-10-CM

## 2011-10-22 DIAGNOSIS — J40 Bronchitis, not specified as acute or chronic: Secondary | ICD-10-CM

## 2011-10-22 DIAGNOSIS — M25569 Pain in unspecified knee: Secondary | ICD-10-CM

## 2011-10-22 HISTORY — DX: Heart failure, unspecified: I50.9

## 2011-10-22 HISTORY — DX: Essential (primary) hypertension: I10

## 2011-10-22 MED ORDER — HYDROCODONE-ACETAMINOPHEN 5-500 MG PO TABS: 1.0000 | ORAL_TABLET | Freq: Four times a day (QID) | ORAL | Status: AC | PRN

## 2011-10-22 MED ORDER — ALBUTEROL SULFATE HFA 108 (90 BASE) MCG/ACT IN AERS: 1.0000 | INHALATION_SPRAY | Freq: Four times a day (QID) | RESPIRATORY_TRACT | Status: DC | PRN

## 2011-10-22 MED ORDER — AMOXICILLIN 500 MG PO CAPS: 500.0000 mg | ORAL_CAPSULE | Freq: Three times a day (TID) | ORAL | Status: AC

## 2011-10-22 NOTE — ED Notes (Signed)
Pt  Has  Symptoms  Of  Nasal  Congestion  Non  Productive  Cough       sorethroat  As  Well   As    stuffynose       X  sev  Days  Also  Reports  l  Knee  Pain worse  On  Weight  Bearing  X  2  Weeks

## 2011-10-25 ENCOUNTER — Other Ambulatory Visit: Payer: Self-pay | Admitting: Family Medicine

## 2011-10-25 DIAGNOSIS — Z1231 Encounter for screening mammogram for malignant neoplasm of breast: Secondary | ICD-10-CM

## 2011-10-25 NOTE — ED Provider Notes (Signed)
History     CSN: [id number]  Arrival date & time 10/22/11  1730   First MD Initiated Contact with Patient 10/22/11 1749      Chief Complaint  Patient presents with  . Sore Throat    (Consider location/radiation/quality/duration/timing/severity/associated sxs/prior treatment) HPI Comments: 55 y/o female h/o CHF, obesity here c/o: 1) non productive cough, congestion , runny nose for almost 1 week. Starting with low grade fever and sore throat for the last 2 days. Has had wheezing and states she had asthma as child. Non Denies chest pain or shortness of breath. Decreased appetite but drinking fluids well, non smoker, no vomiting or diarrhea. 2) Let knee pain: for 2 week does not remember any injury. No swelling or redness but mild to moderate pain when putting weight on it. Has had problems with knee pain in past. She is trying to loose weight.    Past Medical History  Diagnosis Date  . Hypertension   . CHF (congestive heart failure)     Past Surgical History  Procedure Date  . Cholecystectomy   . Thyroid surgery     Family History  Problem Relation Age of Onset  . Cancer Mother   . Hypertension Mother     History  Substance Use Topics  . Smoking status: Not on file  . Smokeless tobacco: Not on file  . Alcohol Use:     OB History    Grav Para Term Preterm Abortions TAB SAB Ect Mult Living                  Review of Systems  Constitutional: Positive for fever. Negative for chills and appetite change.  HENT: Positive for congestion, sore throat and rhinorrhea.   Respiratory: Positive for cough and wheezing. Negative for chest tightness and shortness of breath.   Cardiovascular: Negative for chest pain, palpitations and leg swelling.  Gastrointestinal: Negative for nausea, vomiting and diarrhea.  Musculoskeletal: Positive for arthralgias. Negative for myalgias.  Skin: Negative for rash.  Neurological: Negative for dizziness.    Allergies  Review of patient's  allergies indicates no known allergies.  Home Medications   Current Outpatient Rx  Name Route Sig Dispense Refill  . DESLORATADINE 5 MG PO TABS Oral Take 5 mg by mouth daily.      . ALBUTEROL SULFATE HFA 108 (90 BASE) MCG/ACT IN AERS Inhalation Inhale 1-2 puffs into the lungs every 6 (six) hours as needed for wheezing. 1 Inhaler 0  . ALENDRONATE SODIUM 70 MG PO TABS  TAKE ONE TABLET BY MOUTH ONCE A WEEK 4 tablet 5  . AMOXICILLIN 500 MG PO CAPS Oral Take 1 capsule (500 mg total) by mouth 3 (three) times daily. 21 capsule 0  . ATORVASTATIN CALCIUM 20 MG PO TABS  TAKE ONE TABLET BY MOUTH EVERY DAY 30 tablet 0    Pt needs a med check  . HYDROCODONE-ACETAMINOPHEN 5-500 MG PO TABS Oral Take 1 tablet by mouth every 6 (six) hours as needed for pain (or cough). 20 tablet 0  . LEVOTHYROXINE SODIUM 125 MCG PO CAPS Oral Take 1 capsule (125 mcg total) by mouth daily. 30 capsule 6  . LISINOPRIL-HYDROCHLOROTHIAZIDE 20-25 MG PO TABS Oral Take 1 tablet by mouth daily. 30 tablet 11  . NIFEDIPINE ER OSMOTIC 30 MG PO TB24 Oral Take 1 tablet (30 mg total) by mouth daily. 30 tablet 11    BP 114/61  Pulse 88  Temp(Src) 100.4 F (38 C) (Oral)  Resp 22  SpO2  100%  LMP 10/22/2011  Physical Exam  Nursing note and vitals reviewed. Constitutional: She is oriented to person, place, and time. She appears well-developed and well-nourished. No distress.       obese  HENT:       Nasal Congestion with erythema and swelling of nasal turbinates, clear rhinorrhea. pharyngeal erythema no exudates. No uvula deviation. No trismus. TM's normal.  Neck: No JVD present.  Cardiovascular: Normal rate, regular rhythm and normal heart sounds.  Exam reveals no gallop and no friction rub.   No murmur heard. Pulmonary/Chest: Breath sounds normal. No respiratory distress. She has no wheezes. She has no rales. She exhibits no tenderness.  Lymphadenopathy:    She has no cervical adenopathy.  Neurological: She is alert and oriented  to person, place, and time.  Skin: No rash noted.    ED Course  Procedures (including critical care time)  Labs Reviewed - No data to display No results found.   1. Bronchitis   2. Knee pain, left       MDM  Late onset of fever decided to cover with amoxicillin given co morbidities, no findings in lung exam suggestive of pneumonia.Refilled albuterol. Red flags for CHF exacerbation discussed. Will request nutritional consult at next pcp visit.  Knee pain exacerbation treated with Vicodin as can help with cough and pain.         Sharin Grave, MD 10/25/11 1001

## 2011-11-12 ENCOUNTER — Other Ambulatory Visit: Payer: Self-pay | Admitting: Family Medicine

## 2011-11-24 ENCOUNTER — Ambulatory Visit
Admission: RE | Admit: 2011-11-24 | Discharge: 2011-11-24 | Disposition: A | Discharge status: Home / Self Care | Payer: BC Managed Care – PPO | Source: Ambulatory Visit | Attending: Family Medicine | Admitting: Family Medicine

## 2011-11-24 DIAGNOSIS — Z1231 Encounter for screening mammogram for malignant neoplasm of breast: Secondary | ICD-10-CM

## 2011-12-24 ENCOUNTER — Other Ambulatory Visit: Payer: Self-pay | Admitting: Family Medicine

## 2011-12-24 ENCOUNTER — Other Ambulatory Visit: Payer: Self-pay | Admitting: Internal Medicine

## 2012-01-15 ENCOUNTER — Other Ambulatory Visit: Payer: Self-pay | Admitting: Family Medicine

## 2012-01-31 ENCOUNTER — Ambulatory Visit (INDEPENDENT_AMBULATORY_CARE_PROVIDER_SITE_OTHER): Payer: BC Managed Care – PPO | Admitting: Medical

## 2012-01-31 ENCOUNTER — Encounter: Payer: Self-pay | Admitting: Medical

## 2012-01-31 VITALS — BP 130/78 | HR 76 | Temp 97.8°F | Resp 16 | Wt 235.5 lb

## 2012-01-31 DIAGNOSIS — E039 Hypothyroidism, unspecified: Secondary | ICD-10-CM

## 2012-01-31 DIAGNOSIS — I10 Essential (primary) hypertension: Secondary | ICD-10-CM

## 2012-01-31 DIAGNOSIS — R5383 Other fatigue: Secondary | ICD-10-CM

## 2012-01-31 DIAGNOSIS — D649 Anemia, unspecified: Secondary | ICD-10-CM

## 2012-01-31 DIAGNOSIS — E785 Hyperlipidemia, unspecified: Secondary | ICD-10-CM

## 2012-01-31 DIAGNOSIS — R5381 Other malaise: Secondary | ICD-10-CM

## 2012-01-31 LAB — CBC WITH DIFFERENTIAL/PLATELET
Basophils Absolute: 0 10*3/uL (ref 0.0–0.1)
Basophils Relative: 0 % (ref 0–1)
Eosinophils Absolute: 0.2 10*3/uL (ref 0.0–0.7)
Eosinophils Relative: 2 % (ref 0–5)
HCT: 38 % (ref 36.0–46.0)
Hemoglobin: 11.8 g/dL — ABNORMAL LOW (ref 12.0–15.0)
Lymphocytes Relative: 23 % (ref 12–46)
Lymphs Abs: 2 10*3/uL (ref 0.7–4.0)
MCH: 29.9 pg (ref 26.0–34.0)
MCHC: 31.1 g/dL (ref 30.0–36.0)
MCV: 96.2 fL (ref 78.0–100.0)
Monocytes Absolute: 0.4 10*3/uL (ref 0.1–1.0)
Monocytes Relative: 5 % (ref 3–12)
Neutro Abs: 6 10*3/uL (ref 1.7–7.7)
Neutrophils Relative %: 70 % (ref 43–77)
Platelets: 250 10*3/uL (ref 150–400)
RBC: 3.95 MIL/uL (ref 3.87–5.11)
RDW: 14 % (ref 11.5–15.5)
WBC: 8.5 10*3/uL (ref 4.0–10.5)

## 2012-01-31 NOTE — Progress Notes (Signed)
Subjective:   HPI  Bridget Lamb is a 56 y.o. female who presents for general med check.  Hasn't been here in about a year, running out of some of her medications  Took last thyroid medication this morning.  In the past her thyroid levels have been hard to control.  She has been on name brand Synthroid in the past, but most recently has been on generic.   Been doing fine in general, no c/o.   She notes having mammogram in January 2013.  She sees no other specialists currently but has seen cardiology in the past, has had hx/o CHF many years ago, hx/o 2 catheterizations, has seen hematology and had several colonoscopies for anemia, and hematology did bone marrow biopsy and said she simply has iron deficiency anemia, on prescription iron.  No other c/o.  The following portions of the patient's history were reviewed and updated as appropriate: allergies, current medications, past family history, past medical history, past social history, past surgical history and problem list.  No Known Allergies  Current Outpatient Prescriptions on File Prior to Visit  Medication Sig Dispense Refill  . albuterol (PROVENTIL HFA;VENTOLIN HFA) 108 (90 BASE) MCG/ACT inhaler Inhale 1-2 puffs into the lungs every 6 (six) hours as needed for wheezing.  1 Inhaler  0  . alendronate (FOSAMAX) 70 MG tablet TAKE ONE TABLET BY MOUTH ONCE A WEEK  4 tablet  5  . atorvastatin (LIPITOR) 20 MG tablet TAKE ONE TABLET BY MOUTH EVERY DAY  30 tablet  0  . calcium citrate-vitamin D (CITRACAL+D) 315-200 MG-UNIT per tablet Take 1 tablet by mouth 2 (two) times daily.      Marland Kitchen desloratadine (CLARINEX) 5 MG tablet Take 5 mg by mouth daily.        Marland Kitchen FeFum-FePoly-FA-B Cmp-C-Biot (INTEGRA PLUS) CAPS TAKE ONE CAPSULE BY MOUTH EVERY DAY  30 each  1  . levothyroxine (SYNTHROID, LEVOTHROID) 125 MCG tablet TAKE ONE TABLET BY MOUTH EVERY DAY. NEED OFFICE VISIT  30 tablet  0  . lisinopril-hydrochlorothiazide (PRINZIDE,ZESTORETIC) 20-25 MG per tablet  Take 1 tablet by mouth daily.  30 tablet  11  . NIFEdipine (PROCARDIA XL/ADALAT-CC) 30 MG 24 hr tablet Take 1 tablet (30 mg total) by mouth daily.  30 tablet  11    Past Medical History  Diagnosis Date  . Hypertension   . Allergic rhinitis   . Obesity   . Dyslipidemia   . Umbilical hernia   . History of thyroid cancer   . CHF (congestive heart failure) 2004  . H/O echocardiogram 01/07/04    mild LVH, nl LV systolic function, EF 60%, left atrial enlargement, mildly thickened aortic and mitral valves  . Osteopenia     BMD 02/27/08   . Anemia     hematology, Dr. Arbutus Ped, last 01/2011    Past Surgical History  Procedure Date  . Thyroid surgery     partial thyroidectomy  . Cholecystectomy 2003  . Cardiac catheterization 2005  . Colonoscopy 2008  . Bone marrow biopsy   . Total thyroidectomy 2009    Family History  Problem Relation Age of Onset  . Cancer Mother   . Hypertension Mother     History   Social History  . Marital Status: Divorced    Spouse Name: N/A    Number of Children: N/A  . Years of Education: N/A   Occupational History  . Not on file.   Social History Main Topics  . Smoking status: Never Smoker   .  Smokeless tobacco: Not on file  . Alcohol Use: No  . Drug Use: No  . Sexually Active: Not on file   Other Topics Concern  . Not on file   Social History Narrative  . No narrative on file    Review of Systems Constitutional: -fever, -chills, -sweats, -unexpected -weight change, +fatigue ENT: -runny nose, -ear pain, -sore throat Cardiology:  -chest pain, -palpitations, +edema of lower legs on a regular basis, no change from normal Respiratory: -cough, -shortness of breath, -wheezing Gastroenterology: -abdominal pain, -nausea, -vomiting, -diarrhea, -constipation  Hematology: -bleeding or bruising problems Musculoskeletal: -arthralgias, -myalgias, -joint swelling, -back pain Ophthalmology: -vision changes Urology: -dysuria, -difficulty urinating,  -hematuria, -urinary frequency, -urgency Neurology: -headache, -weakness, -tingling, -numbness       Objective:   Physical Exam  General appearance: alert, no distress, WD/WN, obese white female Oral cavity: MMM, no lesions Neck: supple, anterior horizontal surgical scar, no lymphadenopathy, no thyromegaly, no masses Heart: RRR, normal S1, S2, no murmurs Lungs: CTA bilaterally, no wheezes, rhonchi, or rales Abdomen: +bs, soft, non tender, non distended, no masses, no hepatomegaly, no splenomegaly Pulses: 2+ symmetric, upper and lower extremities, normal cap refill Ext: mild generalized edema of lower legs and ankles   Assessment and Plan :     Encounter Diagnoses  Name Primary?  . Essential hypertension, benign Yes  . Dyslipidemia   . Hypothyroidism   . Anemia   . Fatigue    C/t same medications, labs today, will call with results and plan.  Will need to refill statin and thyroid medications pending labs.  Reviewed prior records in chart and updated chart record today.

## 2012-01-31 NOTE — Patient Instructions (Signed)
Continue current medications, and we will call with lab results tomorrow.

## 2012-02-01 ENCOUNTER — Other Ambulatory Visit: Payer: Self-pay | Admitting: Medical

## 2012-02-01 LAB — COMPREHENSIVE METABOLIC PANEL
ALT: 12 U/L (ref 0–35)
AST: 17 U/L (ref 0–37)
Albumin: 4.1 g/dL (ref 3.5–5.2)
Alkaline Phosphatase: 88 U/L (ref 39–117)
BUN: 24 mg/dL — ABNORMAL HIGH (ref 6–23)
CO2: 28 mEq/L (ref 19–32)
Calcium: 9.8 mg/dL (ref 8.4–10.5)
Chloride: 101 mEq/L (ref 96–112)
Creat: 0.93 mg/dL (ref 0.50–1.10)
Glucose, Bld: 90 mg/dL (ref 70–99)
Potassium: 4.2 mEq/L (ref 3.5–5.3)
Sodium: 138 mEq/L (ref 135–145)
Total Bilirubin: 0.4 mg/dL (ref 0.3–1.2)
Total Protein: 7.3 g/dL (ref 6.0–8.3)

## 2012-02-01 LAB — IRON: Iron: 82 ug/dL (ref 42–145)

## 2012-02-01 LAB — IBC PANEL
%SAT: 29 % (ref 20–55)
TIBC: 284 ug/dL (ref 250–470)
UIBC: 202 ug/dL (ref 125–400)

## 2012-02-01 LAB — LIPID PANEL
Cholesterol: 190 mg/dL (ref 0–200)
HDL: 55 mg/dL (ref >39)
LDL Cholesterol: 118 mg/dL — ABNORMAL HIGH (ref 0–99)
Total CHOL/HDL Ratio: 3.5 Ratio
Triglycerides: 84 mg/dL (ref <150)
VLDL: 17 mg/dL (ref 0–40)

## 2012-02-01 LAB — T3: T3, Total: 72.8 ng/dL — ABNORMAL LOW (ref 80.0–204.0)

## 2012-02-01 LAB — TSH: TSH: 36.998 u[IU]/mL — ABNORMAL HIGH (ref 0.350–4.500)

## 2012-02-01 LAB — FERRITIN: Ferritin: 192 ng/mL (ref 10–291)

## 2012-02-01 LAB — T4, FREE: Free T4: 1.14 ng/dL (ref 0.80–1.80)

## 2012-02-01 MED ORDER — ATORVASTATIN CALCIUM 20 MG PO TABS: 20.0000 mg | ORAL_TABLET | Freq: Every day | ORAL | Status: DC

## 2012-02-01 MED ORDER — LEVOTHYROXINE SODIUM 125 MCG PO TABS: 125.0000 ug | ORAL_TABLET | Freq: Every day | ORAL | Status: DC

## 2012-02-08 ENCOUNTER — Other Ambulatory Visit: Payer: Self-pay | Admitting: Medical

## 2012-03-31 ENCOUNTER — Other Ambulatory Visit: Payer: Self-pay | Admitting: Family Medicine

## 2012-03-31 ENCOUNTER — Encounter (HOSPITAL_COMMUNITY): Payer: Self-pay | Admitting: Emergency Medicine

## 2012-03-31 ENCOUNTER — Emergency Department (INDEPENDENT_AMBULATORY_CARE_PROVIDER_SITE_OTHER)
Admission: EM | Admit: 2012-03-31 | Discharge: 2012-03-31 | Disposition: A | Discharge status: Home / Self Care | Payer: BC Managed Care – PPO | Source: Home / Self Care | Attending: Emergency Medicine | Admitting: Emergency Medicine

## 2012-03-31 DIAGNOSIS — M7042 Prepatellar bursitis, left knee: Secondary | ICD-10-CM

## 2012-03-31 DIAGNOSIS — M7040 Prepatellar bursitis, unspecified knee: Secondary | ICD-10-CM

## 2012-03-31 MED ORDER — MELOXICAM 15 MG PO TABS: 15.0000 mg | ORAL_TABLET | Freq: Every day | ORAL | Status: AC

## 2012-03-31 MED ORDER — HYDROCODONE-ACETAMINOPHEN 5-325 MG PO TABS: 2.0000 | ORAL_TABLET | ORAL | Status: AC | PRN

## 2012-03-31 NOTE — Discharge Instructions (Signed)
Take the medication as written. Take 1 gram of tylenol with the motrin up to 4 times a day as needed for pain and fever. This with the meloxicam is an effective combination for pain. Take the hydrocodone/norco only for severe pain. Do not take the tylenol and hydrocodone/norco as they both have tylenol in them and too much can hurt your liver. Do not exceed 4 grams of tylenol a day from all sources. Return if you get worse, have a  fever >100.4, or for any concerns.   Go to www.goodrx.com to look up your medications. This will give you a list of where you can find your prescriptions at the most affordable prices.   

## 2012-03-31 NOTE — ED Provider Notes (Signed)
History     CSN: [id number]  Arrival date & time 03/31/12  1503   First MD Initiated Contact with Patient 03/31/12 1507      Chief Complaint  Patient presents with  . Knee Pain    (Consider location/radiation/quality/duration/timing/severity/associated sxs/prior treatment) HPI Comments: Patient presents with left knee pain, swelling starting 2 days ago. She works at [employer], and spends long hours on her feet. 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 She reports some decreased sensation along the medial aspect of her knee. No recent or remote history of trauma to her knee, and no redness, numbness distally, distal edema. Pain is worse with walking and with extending her knee. It is better with rest. She has tried muscle rubs, topical compresses without improvement. PMH includes morbid obesity, osteopenia. No history of diabetes.  ROS as noted in HPI. All other ROS negative.   Patient is a 56 y.o. female presenting with knee pain. The history is provided by the patient. No language interpreter was used.  Knee Pain This is a new problem. The current episode started 2 days ago. The problem occurs constantly. The problem has been gradually worsening. The symptoms are aggravated by walking. The symptoms are relieved by nothing. She has tried a cold compress and a warm compress for the symptoms. The treatment provided no relief.    Past Medical History  Diagnosis Date  . Hypertension   . Allergic rhinitis   . Obesity   . Dyslipidemia   . Umbilical hernia   . History of thyroid cancer   . CHF (congestive heart failure) 2004  . H/O echocardiogram 01/07/04    mild LVH, nl LV systolic function, EF 60%, left atrial enlargement, mildly thickened aortic and mitral valves  . Osteopenia     BMD 02/27/08   . Anemia     hematology, Dr. Arbutus Ped, last 01/2011  . Osteopenia     Past Surgical History  Procedure Date  . Thyroid surgery    partial thyroidectomy  . Cholecystectomy 2003  . Cardiac catheterization 2005  . Colonoscopy 2008  . Bone marrow biopsy   . Total thyroidectomy 2009    Family History  Problem Relation Age of Onset  . Cancer Mother   . Hypertension Mother     History  Substance Use Topics  . Smoking status: Never Smoker   . Smokeless tobacco: Not on file  . Alcohol Use: No    OB History    Grav Para Term Preterm Abortions TAB SAB Ect Mult Living                  Review of Systems  Allergies  Review of patient's allergies indicates no known allergies.  Home Medications   Current Outpatient Rx  Name Route Sig Dispense Refill  . ALBUTEROL SULFATE HFA 108 (90 BASE) MCG/ACT IN AERS Inhalation Inhale 1-2 puffs into the lungs every 6 (six) hours as needed for wheezing. 1 Inhaler 0  . ALENDRONATE SODIUM 70 MG PO TABS  TAKE ONE TABLET BY MOUTH ONCE A WEEK 4 tablet 5  . ASPIRIN 81 MG PO TABS Oral Take 81 mg by mouth daily.    . ATORVASTATIN CALCIUM 20 MG PO TABS Oral Take 1 tablet (20 mg total) by mouth daily. 30 tablet 5  . CALCIUM CITRATE-VITAMIN D 315-200 MG-UNIT PO TABS Oral Take 1 tablet by mouth 2 (two) times daily.    . DESLORATADINE  5 MG PO TABS Oral Take 5 mg by mouth daily.      . INTEGRA PLUS PO CAPS  TAKE ONE CAPSULE BY MOUTH EVERY DAY 30 each 1  . HYDROCODONE-ACETAMINOPHEN 5-325 MG PO TABS Oral Take 2 tablets by mouth every 4 (four) hours as needed for pain. 20 tablet 0  . LEVOTHYROXINE SODIUM 125 MCG PO TABS  TAKE ONE TABLET BY MOUTH EVERY DAY 30 tablet 5    Dispense as written.  Marland Kitchen LISINOPRIL-HYDROCHLOROTHIAZIDE 20-25 MG PO TABS  TAKE ONE TABLET BY MOUTH DAILY. 30 tablet 11  . MELOXICAM 15 MG PO TABS Oral Take 1 tablet (15 mg total) by mouth daily. 14 tablet 0  . NIFEDIPINE ER OSMOTIC 30 MG PO TB24  TAKE ONE TABLET BY MOUTH EVERY DAY 30 tablet 11    BP 175/90  Pulse 72  Temp(Src) 99.2 F (37.3 C) (Oral)  Resp 20  SpO2 97%  LMP 10/22/2011  Physical Exam  Nursing note and  vitals reviewed. Constitutional: She is oriented to person, place, and time. She appears well-developed and well-nourished. No distress.  HENT:  Head: Normocephalic and atraumatic.  Eyes: Conjunctivae and EOM are normal.  Neck: Normal range of motion.  Cardiovascular: Normal rate.   Pulmonary/Chest: Effort normal.  Abdominal: She exhibits no distension.  Musculoskeletal: Normal range of motion.       Swelling over and inferior to patella. Knee ROM baseline for PT  Flexion  intact . Pain inferior to patella with knee extension.  Patella NT, Patellar apprehension test negative, Patellar tendon tender, pain in medial joint when placing valgus stress on it, otherwise is nontender, Lateral joint NT, Popliteal region NT, Lachman's stable, Varus stress testing stable, Valgus stress testing stable, McMurray's testing normal l, distal NVI with intact baseline sensation / motor / pulse distal to knee affected extremity.   Neurological: She is alert and oriented to person, place, and time.  Skin: Skin is warm and dry.  Psychiatric: She has a normal mood and affect. Her behavior is normal. Judgment and thought content normal.    ED Course  Procedures (including critical care time)  Labs Reviewed - No data to display No results found.   1. Bursitis, prepatellar, left     MDM  H&P most consistent with a prepatellar bursitis. Also has a small knee effusion, most likely from overuse. She  has some tenderness along lateral joint line with valgus stress. Raises negative. She is obese, and suspect a significant amount of DJD, she may be starting to wear her meniscus. Placing her in crutches, knee immobilizer, NSAIDs, Norco. Ice, elevation. Deferring x-ray, as no history of trauma, bony tenderness suggesting fracture. Will have her follow with Dr. Charlann Boxer, orthopedist on call for further evaluation.  Luiz Blare, MD 03/31/12 1721

## 2012-03-31 NOTE — ED Notes (Signed)
PT HERE WITH C/O LEFT KNEE PAIN THAT WORSENS WITH AMBULATION AND NUMBNESS NOTED TO RIGHT MEDIAL KNEE.SX STARTED X 2 DYS AGO.SWELLING SEEN IN BOTH LEGS BUT PT DENIES SOB OR CP.PT TRIED COLD PACKS,AND HOT PACKS BUT NO RELIEF

## 2012-09-24 ENCOUNTER — Other Ambulatory Visit: Payer: Self-pay | Admitting: Internal Medicine

## 2012-10-20 ENCOUNTER — Other Ambulatory Visit: Payer: Self-pay | Admitting: Family Medicine

## 2012-10-20 DIAGNOSIS — Z1231 Encounter for screening mammogram for malignant neoplasm of breast: Secondary | ICD-10-CM

## 2012-10-25 HISTORY — PX: HERNIA REPAIR: SHX51

## 2012-11-16 ENCOUNTER — Emergency Department (INDEPENDENT_AMBULATORY_CARE_PROVIDER_SITE_OTHER)
Admission: EM | Admit: 2012-11-16 | Discharge: 2012-11-16 | Disposition: A | Discharge status: Home / Self Care | Payer: BC Managed Care – PPO | Source: Home / Self Care | Attending: Family Medicine | Admitting: Family Medicine

## 2012-11-16 ENCOUNTER — Encounter (HOSPITAL_COMMUNITY): Payer: Self-pay | Admitting: Emergency Medicine

## 2012-11-16 ENCOUNTER — Emergency Department (INDEPENDENT_AMBULATORY_CARE_PROVIDER_SITE_OTHER): Payer: BC Managed Care – PPO

## 2012-11-16 DIAGNOSIS — J069 Acute upper respiratory infection, unspecified: Secondary | ICD-10-CM

## 2012-11-16 DIAGNOSIS — R05 Cough: Secondary | ICD-10-CM

## 2012-11-16 DIAGNOSIS — R059 Cough, unspecified: Secondary | ICD-10-CM

## 2012-11-16 MED ORDER — FLUTICASONE PROPIONATE 50 MCG/ACT NA SUSP: 2.0000 | Freq: Every day | NASAL | Status: DC

## 2012-11-16 MED ORDER — ALBUTEROL SULFATE HFA 108 (90 BASE) MCG/ACT IN AERS: 1.0000 | INHALATION_SPRAY | Freq: Four times a day (QID) | RESPIRATORY_TRACT | Status: DC | PRN

## 2012-11-16 MED ORDER — TRAMADOL HCL 50 MG PO TABS: 50.0000 mg | ORAL_TABLET | Freq: Four times a day (QID) | ORAL | Status: DC | PRN

## 2012-11-16 MED ORDER — BENZONATATE 100 MG PO CAPS: 100.0000 mg | ORAL_CAPSULE | Freq: Three times a day (TID) | ORAL | Status: DC

## 2012-11-16 MED ORDER — CETIRIZINE HCL 10 MG PO TABS: 10.0000 mg | ORAL_TABLET | Freq: Every day | ORAL | Status: DC

## 2012-11-16 NOTE — ED Provider Notes (Signed)
History     CSN: [id number]  Arrival date & time 11/16/12  1615   First MD Initiated Contact with Patient 11/16/12 1624      Chief Complaint  Patient presents with  . Otalgia    (Consider location/radiation/quality/duration/timing/severity/associated sxs/prior treatment) HPI Comments: 57 year old female with history of hypertension and CHF among other chronic comorbidities. Here complaining of nasal congestion, clear rhinorrhea productive cough with clear sputum for 2-3 days. Symptoms associated with what patient describes as "chest congestion " and wheezing during coughing spells also complaining of bilateral ear pain which is the most bothersome symptom for her currently. No fever although reports low-grade temperature 100.2 yesterday. Patient denies ever smoking, reports she had asthma as a child and has used albuterol inhaler with improvement of wheezing before. Denies abdominal pain nausea vomiting or diarrhea.   Past Medical History  Diagnosis Date  . Hypertension   . Allergic rhinitis   . Obesity   . Dyslipidemia   . Umbilical hernia   . History of thyroid cancer   . CHF (congestive heart failure) 2004  . H/O echocardiogram 01/07/04    mild LVH, nl LV systolic function, EF 60%, left atrial enlargement, mildly thickened aortic and mitral valves  . Osteopenia     BMD 02/27/08   . Anemia     hematology, Dr. Arbutus Ped, last 01/2011  . Osteopenia     Past Surgical History  Procedure Date  . Thyroid surgery     partial thyroidectomy  . Cholecystectomy 2003  . Cardiac catheterization 2005  . Colonoscopy 2008  . Bone marrow biopsy   . Total thyroidectomy 2009    Family History  Problem Relation Age of Onset  . Cancer Mother   . Hypertension Mother     History  Substance Use Topics  . Smoking status: Never Smoker   . Smokeless tobacco: Not on file  . Alcohol Use: No    OB History    Grav Para Term Preterm Abortions TAB SAB Ect Mult Living                   Review of Systems  Constitutional: Positive for fever and chills. Negative for diaphoresis and fatigue.  HENT: Positive for ear pain, congestion and rhinorrhea. Negative for sore throat and trouble swallowing.   Respiratory: Positive for cough, shortness of breath and wheezing.   Cardiovascular: Negative for chest pain and palpitations.       Leg swelling at base line as per patient.  Gastrointestinal: Negative for nausea, vomiting and abdominal pain.  Musculoskeletal: Negative for myalgias, back pain and arthralgias.  Skin: Negative for rash.  Neurological: Negative for dizziness and headaches.  All other systems reviewed and are negative.    Allergies  Review of patient's allergies indicates no known allergies.  Home Medications   Current Outpatient Rx  Name  Route  Sig  Dispense  Refill  . ALENDRONATE SODIUM 70 MG PO TABS      TAKE ONE TABLET BY MOUTH ONCE A WEEK   4 tablet   5   . ASPIRIN 81 MG PO TABS   Oral   Take 81 mg by mouth daily.         . ATORVASTATIN CALCIUM 20 MG PO TABS   Oral   Take 1 tablet (20 mg total) by mouth daily.   30 tablet   5   . CALCIUM CITRATE-VITAMIN D 315-200 MG-UNIT PO TABS   Oral   Take 1 tablet by mouth  2 (two) times daily.         . ALBUTEROL SULFATE HFA 108 (90 BASE) MCG/ACT IN AERS   Inhalation   Inhale 1-2 puffs into the lungs every 6 (six) hours as needed for wheezing.   1 Inhaler   0   . BENZONATATE 100 MG PO CAPS   Oral   Take 1 capsule (100 mg total) by mouth every 8 (eight) hours.   21 capsule   0   . CETIRIZINE HCL 10 MG PO TABS   Oral   Take 1 tablet (10 mg total) by mouth daily.   30 tablet   0   . INTEGRA PLUS PO CAPS      TAKE ONE CAPSULE BY MOUTH EVERY DAY   30 capsule   0   . FLUTICASONE PROPIONATE 50 MCG/ACT NA SUSP   Nasal   Place 2 sprays into the nose daily.   16 g   0   . LEVOTHYROXINE SODIUM 125 MCG PO TABS      TAKE ONE TABLET BY MOUTH EVERY DAY   30 tablet   5     Dispense  as written.   Marland Kitchen LISINOPRIL-HYDROCHLOROTHIAZIDE 20-25 MG PO TABS      TAKE ONE TABLET BY MOUTH DAILY.   30 tablet   11   . MELOXICAM 15 MG PO TABS   Oral   Take 1 tablet (15 mg total) by mouth daily.   14 tablet   0   . NIFEDIPINE ER OSMOTIC 30 MG PO TB24      TAKE ONE TABLET BY MOUTH EVERY DAY   30 tablet   11   . TRAMADOL HCL 50 MG PO TABS   Oral   Take 1 tablet (50 mg total) by mouth every 6 (six) hours as needed for pain.   15 tablet   0     BP 164/74  Pulse 85  Temp 99.2 F (37.3 C) (Oral)  Resp 24  SpO2 96%  LMP 10/22/2011  Physical Exam  Nursing note and vitals reviewed. Constitutional: She is oriented to person, place, and time. No distress.       Morbidly obese  HENT:  Head: Normocephalic and atraumatic.       Nasal Congestion with erythema and swelling of nasal turbinates, clear rhinorrhea. pharyngeal erythema no exudates. No uvula deviation. No trismus. TM's with clear fluid behind increased vascular markings and some dullness bilaterally, no swelling or bulging   Eyes: Conjunctivae normal are normal. Right eye exhibits no discharge. Left eye exhibits no discharge. No scleral icterus.  Neck: Neck supple. No JVD present. No thyromegaly present.  Cardiovascular: Normal rate, regular rhythm and normal heart sounds.  Exam reveals no gallop and no friction rub.   No murmur heard.      Bilateral pitting ankle edema.   Pulmonary/Chest: Effort normal. No respiratory distress. She has no wheezes. She has no rales. She exhibits no tenderness.       Mild sporadic inspiratory stridor when talking. bronchitic cough, Scattered bilateral rhonchi. No active wheezing. No rales. No orthopnea or tachypnea.   Lymphadenopathy:    She has no cervical adenopathy.  Neurological: She is alert and oriented to person, place, and time.  Skin: She is not diaphoretic.    ED Course  Procedures (including critical care time)  Labs Reviewed - No data to display Dg Chest 2  View  11/16/2012  *RADIOLOGY REPORT*  Clinical Data: Cough, fever, shortness of breath.  CHEST -  2 VIEW  Comparison: 12/10/2010  Findings: Mild cardiomegaly.  No confluent airspace opacities, effusions or edema.  Mild vascular congestion.  No acute bony abnormality.  IMPRESSION: Mild cardiomegaly and vascular congestion.   Original Report Authenticated By: Charlett Nose, M.D.      1. URI (upper respiratory infection)   2. Cough     EKG with normal sinus rhythm. Ventricular rate 78 beats per minute. Not acute ischemic changes.  MDM  Impress viral upper respiratory infection. Symptoms for 3 days. EKG is normal other than nonspecific T wave abnormality. Chest x-rays with no signs of infiltrates or pulmonary edema but mild vascular congestion.  Prescribed Tessalon Perles, Flonase, tramadol and refill albuterol inhaler. Supportive care and red flags that should prompt her return to medical attention discussed with patient and provided in writing. Specifically I discussed with patient and early signs of CHF exacerbation. Patient is to check her weight daily at home and will followup next with her with her primary care provider next week to monitor her symptoms. Asked to go to the emergency department if chest pain or shortness of breath or any worsening of her symptoms despite following treatment.Sharin Grave, MD 11/17/12 1209

## 2012-11-16 NOTE — ED Notes (Signed)
Reports headache, earache, chest tightness, minimal phlegm, clear/yellowish.  Reports lower back pain, not sure if related to coughing.

## 2012-11-16 NOTE — Discharge Instructions (Signed)
Your EKG (electrical heart activity test) is normal today. Your chest x-ray is not showing any signs of pneumonia or fluid in your lungs. My impression is that your symptoms are related to an upper respiratory virus, but your history of CHF cough could be an early sign of CHF decompensation. I recommend that you keep daily weights and followup next week to see your primary care provider or cardiologist. Make sure to take your blood pressure and diabetic medications as previously prescribed.  Take/use the prescribed medications as instructed . Go to the emergency department if worsening symptoms like chest pain, shortness of breath despite following treatment.

## 2012-11-24 ENCOUNTER — Ambulatory Visit
Admission: RE | Admit: 2012-11-24 | Discharge: 2012-11-24 | Disposition: A | Discharge status: Home / Self Care | Payer: BC Managed Care – PPO | Source: Ambulatory Visit | Attending: Family Medicine | Admitting: Family Medicine

## 2012-11-24 DIAGNOSIS — Z1231 Encounter for screening mammogram for malignant neoplasm of breast: Secondary | ICD-10-CM

## 2012-11-28 ENCOUNTER — Other Ambulatory Visit: Payer: Self-pay | Admitting: Family Medicine

## 2012-11-28 DIAGNOSIS — R928 Other abnormal and inconclusive findings on diagnostic imaging of breast: Secondary | ICD-10-CM

## 2012-12-12 ENCOUNTER — Ambulatory Visit
Admission: RE | Admit: 2012-12-12 | Discharge: 2012-12-12 | Disposition: A | Discharge status: Home / Self Care | Payer: BC Managed Care – PPO | Source: Ambulatory Visit | Attending: Family Medicine | Admitting: Family Medicine

## 2012-12-12 DIAGNOSIS — R928 Other abnormal and inconclusive findings on diagnostic imaging of breast: Secondary | ICD-10-CM

## 2012-12-23 ENCOUNTER — Other Ambulatory Visit: Payer: Self-pay | Admitting: Internal Medicine

## 2013-01-22 ENCOUNTER — Other Ambulatory Visit (HOSPITAL_COMMUNITY)
Admission: RE | Admit: 2013-01-22 | Discharge: 2013-01-22 | Disposition: A | Discharge status: Home / Self Care | Payer: BC Managed Care – PPO | Source: Ambulatory Visit | Attending: Family Medicine | Admitting: Family Medicine

## 2013-01-22 ENCOUNTER — Other Ambulatory Visit: Payer: Self-pay | Admitting: Family Medicine

## 2013-01-22 DIAGNOSIS — Z124 Encounter for screening for malignant neoplasm of cervix: Secondary | ICD-10-CM | POA: Insufficient documentation

## 2013-01-22 DIAGNOSIS — Z1151 Encounter for screening for human papillomavirus (HPV): Secondary | ICD-10-CM | POA: Insufficient documentation

## 2013-02-01 ENCOUNTER — Ambulatory Visit: Payer: BC Managed Care – PPO | Attending: Family Medicine

## 2013-02-01 DIAGNOSIS — R5381 Other malaise: Secondary | ICD-10-CM | POA: Insufficient documentation

## 2013-02-01 DIAGNOSIS — M545 Low back pain, unspecified: Secondary | ICD-10-CM | POA: Insufficient documentation

## 2013-02-01 DIAGNOSIS — M25659 Stiffness of unspecified hip, not elsewhere classified: Secondary | ICD-10-CM | POA: Insufficient documentation

## 2013-02-01 DIAGNOSIS — IMO0001 Reserved for inherently not codable concepts without codable children: Secondary | ICD-10-CM | POA: Insufficient documentation

## 2013-02-06 ENCOUNTER — Ambulatory Visit: Payer: BC Managed Care – PPO | Admitting: Physical Therapy

## 2013-02-07 ENCOUNTER — Ambulatory Visit: Payer: BC Managed Care – PPO

## 2013-02-13 ENCOUNTER — Ambulatory Visit: Payer: BC Managed Care – PPO

## 2013-02-14 ENCOUNTER — Other Ambulatory Visit: Payer: Self-pay | Admitting: Internal Medicine

## 2013-02-14 ENCOUNTER — Other Ambulatory Visit: Payer: Self-pay | Admitting: Medical

## 2013-02-21 ENCOUNTER — Ambulatory Visit: Payer: BC Managed Care – PPO

## 2013-02-23 ENCOUNTER — Other Ambulatory Visit: Payer: Self-pay | Admitting: Internal Medicine

## 2013-02-28 ENCOUNTER — Ambulatory Visit: Payer: BC Managed Care – PPO | Attending: Family Medicine | Admitting: Physical Therapy

## 2013-02-28 DIAGNOSIS — IMO0001 Reserved for inherently not codable concepts without codable children: Secondary | ICD-10-CM | POA: Insufficient documentation

## 2013-02-28 DIAGNOSIS — R5381 Other malaise: Secondary | ICD-10-CM | POA: Insufficient documentation

## 2013-02-28 DIAGNOSIS — M545 Low back pain, unspecified: Secondary | ICD-10-CM | POA: Insufficient documentation

## 2013-02-28 DIAGNOSIS — M25659 Stiffness of unspecified hip, not elsewhere classified: Secondary | ICD-10-CM | POA: Insufficient documentation

## 2013-03-01 ENCOUNTER — Ambulatory Visit: Payer: BC Managed Care – PPO | Admitting: Physical Therapy

## 2013-04-15 ENCOUNTER — Other Ambulatory Visit: Payer: Self-pay | Admitting: Internal Medicine

## 2013-10-06 ENCOUNTER — Encounter (HOSPITAL_COMMUNITY): Payer: Self-pay | Admitting: Emergency Medicine

## 2013-10-06 ENCOUNTER — Inpatient Hospital Stay (HOSPITAL_COMMUNITY)
Admission: EM | Admit: 2013-10-06 | Discharge: 2013-10-09 | DRG: 354 | Disposition: A | Discharge status: Home / Self Care | Payer: BC Managed Care – PPO | Attending: General Surgery | Admitting: General Surgery

## 2013-10-06 ENCOUNTER — Emergency Department (INDEPENDENT_AMBULATORY_CARE_PROVIDER_SITE_OTHER)
Admission: EM | Admit: 2013-10-06 | Discharge: 2013-10-06 | Disposition: A | Discharge status: Nursing Home (Includes ICF) | Payer: BC Managed Care – PPO | Source: Home / Self Care | Attending: Family Medicine | Admitting: Family Medicine

## 2013-10-06 DIAGNOSIS — K420 Umbilical hernia with obstruction, without gangrene: Principal | ICD-10-CM | POA: Diagnosis present

## 2013-10-06 DIAGNOSIS — I509 Heart failure, unspecified: Secondary | ICD-10-CM | POA: Diagnosis present

## 2013-10-06 DIAGNOSIS — Z79899 Other long term (current) drug therapy: Secondary | ICD-10-CM

## 2013-10-06 DIAGNOSIS — J449 Chronic obstructive pulmonary disease, unspecified: Secondary | ICD-10-CM | POA: Diagnosis present

## 2013-10-06 DIAGNOSIS — Z7982 Long term (current) use of aspirin: Secondary | ICD-10-CM

## 2013-10-06 DIAGNOSIS — K460 Unspecified abdominal hernia with obstruction, without gangrene: Secondary | ICD-10-CM

## 2013-10-06 DIAGNOSIS — K436 Other and unspecified ventral hernia with obstruction, without gangrene: Secondary | ICD-10-CM | POA: Diagnosis present

## 2013-10-06 DIAGNOSIS — I10 Essential (primary) hypertension: Secondary | ICD-10-CM | POA: Diagnosis present

## 2013-10-06 DIAGNOSIS — E039 Hypothyroidism, unspecified: Secondary | ICD-10-CM | POA: Diagnosis present

## 2013-10-06 DIAGNOSIS — E785 Hyperlipidemia, unspecified: Secondary | ICD-10-CM | POA: Diagnosis present

## 2013-10-06 DIAGNOSIS — Z85850 Personal history of malignant neoplasm of thyroid: Secondary | ICD-10-CM

## 2013-10-06 DIAGNOSIS — Z6841 Body Mass Index (BMI) 40.0 and over, adult: Secondary | ICD-10-CM

## 2013-10-06 DIAGNOSIS — K56609 Unspecified intestinal obstruction, unspecified as to partial versus complete obstruction: Secondary | ICD-10-CM

## 2013-10-06 DIAGNOSIS — J4489 Other specified chronic obstructive pulmonary disease: Secondary | ICD-10-CM | POA: Diagnosis present

## 2013-10-06 HISTORY — DX: Essential (primary) hypertension: I10

## 2013-10-06 HISTORY — DX: Morbid (severe) obesity due to excess calories: E66.01

## 2013-10-06 HISTORY — DX: Hypothyroidism, unspecified: E03.9

## 2013-10-06 LAB — URINALYSIS, ROUTINE W REFLEX MICROSCOPIC
Glucose, UA: NEGATIVE mg/dL
Hgb urine dipstick: NEGATIVE
Ketones, ur: NEGATIVE mg/dL
Nitrite: NEGATIVE
Protein, ur: 30 mg/dL — AB
Specific Gravity, Urine: 1.024 (ref 1.005–1.030)
Urobilinogen, UA: 1 mg/dL (ref 0.0–1.0)
pH: 5.5 (ref 5.0–8.0)

## 2013-10-06 LAB — COMPREHENSIVE METABOLIC PANEL
ALT: 12 U/L (ref 0–35)
AST: 19 U/L (ref 0–37)
Albumin: 3.9 g/dL (ref 3.5–5.2)
Alkaline Phosphatase: 102 U/L (ref 39–117)
BUN: 15 mg/dL (ref 6–23)
CO2: 30 mEq/L (ref 19–32)
Calcium: 9.8 mg/dL (ref 8.4–10.5)
Chloride: 101 mEq/L (ref 96–112)
Creatinine, Ser: 0.91 mg/dL (ref 0.50–1.10)
GFR calc Af Amer: 79 mL/min — ABNORMAL LOW (ref >90)
GFR calc non Af Amer: 68 mL/min — ABNORMAL LOW (ref >90)
Glucose, Bld: 102 mg/dL — ABNORMAL HIGH (ref 70–99)
Potassium: 4.4 mEq/L (ref 3.5–5.1)
Sodium: 141 mEq/L (ref 135–145)
Total Bilirubin: 0.5 mg/dL (ref 0.3–1.2)
Total Protein: 8 g/dL (ref 6.0–8.3)

## 2013-10-06 LAB — CBC WITH DIFFERENTIAL/PLATELET
Basophils Absolute: 0 10*3/uL (ref 0.0–0.1)
Basophils Relative: 0 % (ref 0–1)
Eosinophils Absolute: 0.1 10*3/uL (ref 0.0–0.7)
Eosinophils Relative: 1 % (ref 0–5)
HCT: 34.4 % — ABNORMAL LOW (ref 36.0–46.0)
Hemoglobin: 11.6 g/dL — ABNORMAL LOW (ref 12.0–15.0)
Lymphocytes Relative: 16 % (ref 12–46)
Lymphs Abs: 1.7 10*3/uL (ref 0.7–4.0)
MCH: 31.6 pg (ref 26.0–34.0)
MCHC: 33.7 g/dL (ref 30.0–36.0)
MCV: 93.7 fL (ref 78.0–100.0)
Monocytes Absolute: 0.5 10*3/uL (ref 0.1–1.0)
Monocytes Relative: 5 % (ref 3–12)
Neutro Abs: 8.4 10*3/uL — ABNORMAL HIGH (ref 1.7–7.7)
Neutrophils Relative %: 78 % — ABNORMAL HIGH (ref 43–77)
Platelets: 228 10*3/uL (ref 150–400)
RBC: 3.67 MIL/uL — ABNORMAL LOW (ref 3.87–5.11)
RDW: 13.5 % (ref 11.5–15.5)
WBC: 10.7 10*3/uL — ABNORMAL HIGH (ref 4.0–10.5)

## 2013-10-06 LAB — URINE MICROSCOPIC-ADD ON

## 2013-10-06 LAB — LIPASE, BLOOD: Lipase: 25 U/L (ref 11–59)

## 2013-10-06 MED ORDER — HYDROMORPHONE HCL PF 1 MG/ML IJ SOLN
1.0000 mg | Freq: Once | INTRAMUSCULAR | Status: AC
  Administered 2013-10-06: 1 mg via INTRAVENOUS
  Filled 2013-10-06: qty 1

## 2013-10-06 MED ORDER — SODIUM CHLORIDE 0.9 % IV SOLN
INTRAVENOUS | Status: DC
  Administered 2013-10-06 – 2013-10-07 (×2): via INTRAVENOUS

## 2013-10-06 MED ORDER — ONDANSETRON HCL 4 MG/2ML IJ SOLN
4.0000 mg | Freq: Once | INTRAMUSCULAR | Status: AC
  Administered 2013-10-06: 4 mg via INTRAVENOUS
  Filled 2013-10-06: qty 2

## 2013-10-06 NOTE — ED Notes (Signed)
Dr. Artis Flock is in room w/pt States she has been sick w/a cold and started to cough and aggravated her umbilical hernia yest Pain is 5/10... Was not able to sleep yest night due to pain She is alert w/no signs of acute distress.

## 2013-10-06 NOTE — ED Notes (Signed)
Ice applied as ordered.

## 2013-10-06 NOTE — ED Provider Notes (Signed)
CSN: [id number]     Arrival date & time 10/06/13  1635 History   First MD Initiated Contact with Patient 10/06/13 1710     Chief Complaint  Patient presents with  . Hernia   (Consider location/radiation/quality/duration/timing/severity/associated sxs/prior Treatment) Patient is a 57 y.o. female presenting with abdominal pain. The history is provided by the patient.  Abdominal Pain This is a new problem. The current episode started yesterday (uri sx for 1 week, hard cough yest caused increased umbilical pain and swelling and nausea, unable to sleep last eve from pain.). The problem has been gradually worsening. Associated symptoms include abdominal pain. Pertinent negatives include no chest pain, no headaches and no shortness of breath.    Past Medical History  Diagnosis Date  . Hypertension   . Allergic rhinitis   . Obesity   . Dyslipidemia   . Umbilical hernia   . History of thyroid cancer   . CHF (congestive heart failure) 2004  . H/O echocardiogram 01/07/04    mild LVH, nl LV systolic function, EF 60%, left atrial enlargement, mildly thickened aortic and mitral valves  . Osteopenia     BMD 02/27/08   . Anemia     hematology, Dr. Arbutus Ped, last 01/2011  . Osteopenia    Past Surgical History  Procedure Laterality Date  . Thyroid surgery      partial thyroidectomy  . Cholecystectomy  2003  . Cardiac catheterization  2005  . Colonoscopy  2008  . Bone marrow biopsy    . Total thyroidectomy  2009   Family History  Problem Relation Age of Onset  . Cancer Mother   . Hypertension Mother    History  Substance Use Topics  . Smoking status: Never Smoker   . Smokeless tobacco: Not on file  . Alcohol Use: No   OB History   Grav Para Term Preterm Abortions TAB SAB Ect Mult Living                 Review of Systems  Constitutional: Negative.   HENT: Positive for postnasal drip.   Respiratory: Positive for cough. Negative for shortness of breath.   Cardiovascular: Negative  for chest pain.  Gastrointestinal: Positive for nausea and abdominal pain.  Neurological: Negative for headaches.    Allergies  Review of patient's allergies indicates no known allergies.  Home Medications   Current Outpatient Rx  Name  Route  Sig  Dispense  Refill  . alendronate (FOSAMAX) 70 MG tablet      TAKE ONE TABLET BY MOUTH ONCE A WEEK   4 tablet   5   . aspirin 81 MG tablet   Oral   Take 81 mg by mouth daily.         Marland Kitchen atorvastatin (LIPITOR) 20 MG tablet      TAKE ONE TABLET BY MOUTH EVERY DAY   30 tablet   0     PATIENT MUST HAVE MED CHECK BEFORE ANYMORE REFILLS ...   . FeFum-FePoly-FA-B Cmp-C-Biot (INTEGRA PLUS) CAPS      TAKE ONE CAPSULE BY MOUTH ONCE DAILY   30 each   0   . fluticasone (FLONASE) 50 MCG/ACT nasal spray   Nasal   Place 2 sprays into the nose daily.   16 g   0   . levothyroxine (SYNTHROID, LEVOTHROID) 125 MCG tablet      TAKE ONE TABLET BY MOUTH EVERY DAY   30 tablet   5     Dispense as written.   Marland Kitchen  lisinopril-hydrochlorothiazide (PRINZIDE,ZESTORETIC) 20-25 MG per tablet      TAKE ONE TABLET BY MOUTH DAILY.   30 tablet   11   . NIFEdipine (PROCARDIA XL/ADALAT-CC) 30 MG 24 hr tablet      TAKE ONE TABLET BY MOUTH EVERY DAY   30 tablet   11   . albuterol (PROVENTIL HFA;VENTOLIN HFA) 108 (90 BASE) MCG/ACT inhaler   Inhalation   Inhale 1-2 puffs into the lungs every 6 (six) hours as needed for wheezing.   1 Inhaler   0   . benzonatate (TESSALON) 100 MG capsule   Oral   Take 1 capsule (100 mg total) by mouth every 8 (eight) hours.   21 capsule   0   . calcium citrate-vitamin D (CITRACAL+D) 315-200 MG-UNIT per tablet   Oral   Take 1 tablet by mouth 2 (two) times daily.         . cetirizine (ZYRTEC) 10 MG tablet   Oral   Take 1 tablet (10 mg total) by mouth daily.   30 tablet   0   . traMADol (ULTRAM) 50 MG tablet   Oral   Take 1 tablet (50 mg total) by mouth every 6 (six) hours as needed for pain.   15  tablet   0    BP 158/66  Pulse 82  Temp(Src) 98.2 F (36.8 C) (Oral)  Resp 16  SpO2 100%  LMP 10/22/2011 Physical Exam  Nursing note and vitals reviewed. Constitutional: She is oriented to person, place, and time. She appears well-developed and well-nourished.  HENT:  Head: Normocephalic.  Right Ear: External ear normal.  Left Ear: External ear normal.  Nose: Mucosal edema and rhinorrhea present.  Mouth/Throat: Oropharynx is clear and moist.  Cardiovascular: Regular rhythm.   Pulmonary/Chest: Breath sounds normal.  Abdominal: Soft. She exhibits distension. She exhibits no mass. Bowel sounds are decreased. There is tenderness in the periumbilical area. There is no rebound and no guarding. A hernia is present. Hernia confirmed positive in the ventral area.    Neurological: She is alert and oriented to person, place, and time.  Skin: Skin is warm and dry.    ED Course  Procedures (including critical care time) Labs Review Labs Reviewed - No data to display Imaging Review No results found.  EKG Interpretation    Date/Time:    Ventricular Rate:    PR Interval:    QRS Duration:   QT Interval:    QTC Calculation:   R Axis:     Text Interpretation:              MDM  Sent for eval of incarcerated umbilical hernia.    Linna Hoff, MD 10/06/13 [phone]

## 2013-10-06 NOTE — ED Notes (Signed)
Pt stated that her hiatal hernia has been hurting since today. Hiatal hernia was swollen. Currently Dr. Effie Shy in room with pt.  Currently not in pain or nausea. No cardiac or respiratory distress. Will continue to monitor.

## 2013-10-06 NOTE — ED Notes (Signed)
Bed: UC03 Expected date:  Expected time:  Means of arrival:  Comments: Nixed 1615

## 2013-10-06 NOTE — ED Notes (Signed)
Patient stated had cold and coughing spells out of nowhere while at work and her hernia started hurting bad, generalized abdominal pain, " feels like someone twisting my hernia when she cough's". Stated nausea and have not eaten anything all day because she feels  she might vomit.

## 2013-10-06 NOTE — ED Notes (Signed)
The pt is c/o abd pain since yesterday. With nausea she has a cold and she has a cough.  She has a hernia surrounding the umbilicus where she has the pain

## 2013-10-06 NOTE — ED Provider Notes (Signed)
CSN: [id number]     Arrival date & time 10/06/13  1739 History   First MD Initiated Contact with Patient 10/06/13 2050     Chief Complaint  Patient presents with  . Abdominal Pain   (Consider location/radiation/quality/duration/timing/severity/associated sxs/prior Treatment) HPI Comments: Bridget Lamb is a 57 y.o. female states that she felt a pop in the area where she has a hernia, yesterday. Since then she has had pain at the site and feels like she cannot eat. She has not had a vomiting. Her last bowel movement was yesterday. She denies fever, chills, cough, shortness of breath, chest pain, weakness, or dizziness. She states that she always has a lump in her umbilicus, and today it feels somewhat tight or, in this area and above it. There are no other known modifying factors.   Patient is a 57 y.o. female presenting with abdominal pain. The history is provided by the patient.  Abdominal Pain   Past Medical History  Diagnosis Date  . Hypertension   . Allergic rhinitis   . Obesity   . Dyslipidemia   . Umbilical hernia   . History of thyroid cancer   . CHF (congestive heart failure) 2004  . H/O echocardiogram 01/07/04    mild LVH, nl LV systolic function, EF 60%, left atrial enlargement, mildly thickened aortic and mitral valves  . Osteopenia     BMD 02/27/08   . Anemia     hematology, Dr. Arbutus Ped, last 01/2011  . Osteopenia    Past Surgical History  Procedure Laterality Date  . Thyroid surgery      partial thyroidectomy  . Cholecystectomy  2003  . Cardiac catheterization  2005  . Colonoscopy  2008  . Bone marrow biopsy    . Total thyroidectomy  2009   Family History  Problem Relation Age of Onset  . Cancer Mother   . Hypertension Mother    History  Substance Use Topics  . Smoking status: Never Smoker   . Smokeless tobacco: Not on file  . Alcohol Use: No   OB History   Grav Para Term Preterm Abortions TAB SAB Ect Mult Living                 Review of  Systems  Gastrointestinal: Positive for abdominal pain.  All other systems reviewed and are negative.    Allergies  Review of patient's allergies indicates no known allergies.  Home Medications   Current Outpatient Rx  Name  Route  Sig  Dispense  Refill  . albuterol (PROVENTIL HFA;VENTOLIN HFA) 108 (90 BASE) MCG/ACT inhaler   Inhalation   Inhale 1-2 puffs into the lungs every 6 (six) hours as needed for wheezing or shortness of breath.         Marland Kitchen aspirin 81 MG tablet   Oral   Take 81 mg by mouth daily.         Marland Kitchen atorvastatin (LIPITOR) 20 MG tablet   Oral   Take 20 mg by mouth daily.         . calcium citrate-vitamin D (CITRACAL+D) 315-200 MG-UNIT per tablet   Oral   Take 1 tablet by mouth 2 (two) times daily.         . Chlorphen-Phenyleph-APAP (CORICIDIN D COLD/FLU/SINUS) 2-5-325 MG TABS   Oral   Take 2 tablets by mouth daily as needed (for cold/flu symptoms).         . FeFum-FePoly-FA-B Cmp-C-Biot (INTEGRA PLUS) CAPS   Oral   Take 1  capsule by mouth daily.         Marland Kitchen guaiFENesin (MUCINEX FOR KIDS) 100 MG/5ML liquid   Oral   Take 200 mg by mouth 3 (three) times daily as needed for cough.         . levothyroxine (SYNTHROID, LEVOTHROID) 200 MCG tablet   Oral   Take 200 mcg by mouth daily before breakfast.         . lisinopril-hydrochlorothiazide (PRINZIDE,ZESTORETIC) 20-25 MG per tablet   Oral   Take 1 tablet by mouth daily.         Marland Kitchen NIFEdipine (PROCARDIA-XL/ADALAT-CC/NIFEDICAL-XL) 30 MG 24 hr tablet   Oral   Take 30 mg by mouth daily.          BP 134/49  Pulse 74  Temp(Src) 97.3 F (36.3 C) (Oral)  Resp 18  Wt 243 lb 4.8 oz (110.36 kg)  SpO2 100%  LMP 10/22/2011 Physical Exam  Nursing note and vitals reviewed. Constitutional: She is oriented to person, place, and time. She appears well-developed. No distress.  Obese  HENT:  Head: Normocephalic and atraumatic.  Right Ear: External ear normal.  Left Ear: External ear normal.  Eyes:  Conjunctivae and EOM are normal. Pupils are equal, round, and reactive to light.  Neck: Normal range of motion and phonation normal. Neck supple.  Cardiovascular: Normal rate, regular rhythm, normal heart sounds and intact distal pulses.   Pulmonary/Chest: Effort normal and breath sounds normal. She exhibits no bony tenderness.  Abdominal: Soft. Normal appearance.  Tender mass in umbilicus, approximately 6 cm in diameter. The mass is soft and spongy. Initially, the mass could not be reduced with gentle palpation. There is no significant associated abdominal distention, or deformity. There is no palpable intra-abdominal mass  Musculoskeletal: Normal range of motion.  Neurological: She is alert and oriented to person, place, and time. No cranial nerve deficit or sensory deficit. She exhibits normal muscle tone. Coordination normal.  Skin: Skin is warm, dry and intact.  Psychiatric: She has a normal mood and affect. Her behavior is normal. Judgment and thought content normal.    ED Course  Procedures (including critical care time)  23:10 - Ice was placed on her umbilicus, for 90 minutes. She was treated with IV analgesia. An aggressive attempt at reduction of her umbilical hernia was attempted, by me. She tolerated it well. The hernia tissue was reduced about 40%, but not eliminated. The patient has not vomited in the emergency department. CT scan ordered to assess the hernia and look for obstruction.   00:45- Discussed with radiologist- Dr. Benard Rink, regarding the Imaging findings   12:49 AM-Consult complete with Dr. Lindie Spruce. Patient case explained and discussed. He agrees to admit patient for further evaluation and treatment. Call ended at 00:45  Labs Review Labs Reviewed  CBC WITH DIFFERENTIAL - Abnormal; Notable for the following:    WBC 10.7 (*)    RBC 3.67 (*)    Hemoglobin 11.6 (*)    HCT 34.4 (*)    Neutrophils Relative % 78 (*)    Neutro Abs 8.4 (*)    All other components within  normal limits  COMPREHENSIVE METABOLIC PANEL - Abnormal; Notable for the following:    Glucose, Bld 102 (*)    GFR calc non Af Amer 68 (*)    GFR calc Af Amer 79 (*)    All other components within normal limits  URINALYSIS, ROUTINE W REFLEX MICROSCOPIC - Abnormal; Notable for the following:    Color, Urine AMBER (*)  APPearance CLOUDY (*)    Bilirubin Urine SMALL (*)    Protein, ur 30 (*)    Leukocytes, UA MODERATE (*)    All other components within normal limits  URINE MICROSCOPIC-ADD ON - Abnormal; Notable for the following:    Squamous Epithelial / LPF MANY (*)    Bacteria, UA FEW (*)    Casts HYALINE CASTS (*)    All other components within normal limits  URINE CULTURE  LIPASE, BLOOD   Imaging Review Ct Abdomen Pelvis Wo Contrast  10/07/2013   CLINICAL DATA:  Known periumbilical hernia, now with abdominal pain and nausea, diarrhea, and vomiting. Unsuccessful attempt at closed reduction. History of thyroid cancer.  EXAM: CT ABDOMEN AND PELVIS WITHOUT CONTRAST  TECHNIQUE: Multidetector CT imaging of the abdomen and pelvis was performed following the standard protocol without intravenous contrast.  COMPARISON:  None.  FINDINGS: Clear lung bases with cardiomegaly. Coronary artery calcification is present.  Unenhanced appearance of the liver grossly unremarkable. Ill-defined hypo attenuating area approximately 1 cm in size in the spleen of uncertain significance on this noncontrast examination. No gallstones or biliary ductal dilatation. Normal pancreas. No renal obstruction or obvious masses. No visible calculi. Small hiatal hernia. Normal small bowel. Normal appendix.  In the mid abdomen, a loop of transverse colon has become lodged within the umbilical hernia. There is some bowel wall edema but no definite perforation. The ascending and proximal transverse colon are distended implying significant obstruction. The umbilical hernia also contains a significant amount of fat. There may be a  twist of the colon at this location. The descending and sigmoid colon are of normal caliber.  No pelvic masses.  No free fluid or free air.  No osseous lesions.  IMPRESSION: Possible incarcerated loop of transverse colon in an umbilical hernia. Proximal colonic obstruction is suspected. Surgical consultation is warranted. Findings discussed with ordering provider.   Electronically Signed   By: Davonna Belling M.D.   On: 10/07/2013 00:40    EKG Interpretation   None       MDM   1. Incarcerated hernia   2. Bowel obstruction      Umbilical hernia, chronic, with new pain. She does not clinically have a bowel obstruction. I doubt that the hernia tissue contains strangulated tissue. The hernia cannot be reduced. Since she has a persistent sensation of being unable to eat, a CT scan is ordered. The CT indicates incarcerated hernia with transverse colon in the hernia sac and proximal stool and fluid consistent with obstruction   Nursing Notes Reviewed/ Care Coordinated, and agree without changes. Applicable Imaging Reviewed.  Interpretation of Laboratory Data incorporated into ED treatment  Plan: Admit    Flint Melter, MD 10/07/13 [phone]

## 2013-10-07 ENCOUNTER — Encounter (HOSPITAL_COMMUNITY): Payer: BC Managed Care – PPO | Admitting: Anesthesiology

## 2013-10-07 ENCOUNTER — Encounter (HOSPITAL_COMMUNITY): Admission: EM | Disposition: A | Discharge status: Home / Self Care | Payer: Self-pay | Source: Home / Self Care

## 2013-10-07 ENCOUNTER — Encounter (HOSPITAL_COMMUNITY): Payer: Self-pay | Admitting: Anesthesiology

## 2013-10-07 ENCOUNTER — Inpatient Hospital Stay (HOSPITAL_COMMUNITY): Payer: BC Managed Care – PPO | Admitting: Anesthesiology

## 2013-10-07 ENCOUNTER — Emergency Department (HOSPITAL_COMMUNITY): Payer: BC Managed Care – PPO

## 2013-10-07 DIAGNOSIS — K436 Other and unspecified ventral hernia with obstruction, without gangrene: Secondary | ICD-10-CM

## 2013-10-07 HISTORY — PX: VENTRAL HERNIA REPAIR: SHX424

## 2013-10-07 HISTORY — DX: Other and unspecified ventral hernia with obstruction, without gangrene: K43.6

## 2013-10-07 LAB — CBC
HCT: 32.1 % — ABNORMAL LOW (ref 36.0–46.0)
Hemoglobin: 10.6 g/dL — ABNORMAL LOW (ref 12.0–15.0)
MCH: 31.5 pg (ref 26.0–34.0)
MCHC: 33 g/dL (ref 30.0–36.0)
MCV: 95.5 fL (ref 78.0–100.0)
Platelets: 184 10*3/uL (ref 150–400)
RBC: 3.36 MIL/uL — ABNORMAL LOW (ref 3.87–5.11)
RDW: 13.7 % (ref 11.5–15.5)
WBC: 11.4 10*3/uL — ABNORMAL HIGH (ref 4.0–10.5)

## 2013-10-07 LAB — BASIC METABOLIC PANEL
BUN: 16 mg/dL (ref 6–23)
CO2: 31 mEq/L (ref 19–32)
Calcium: 8.7 mg/dL (ref 8.4–10.5)
Chloride: 103 mEq/L (ref 96–112)
Creatinine, Ser: 0.87 mg/dL (ref 0.50–1.10)
GFR calc Af Amer: 83 mL/min — ABNORMAL LOW (ref >90)
GFR calc non Af Amer: 72 mL/min — ABNORMAL LOW (ref >90)
Glucose, Bld: 131 mg/dL — ABNORMAL HIGH (ref 70–99)
Potassium: 4.8 mEq/L (ref 3.5–5.1)
Sodium: 139 mEq/L (ref 135–145)

## 2013-10-07 LAB — SURGICAL PCR SCREEN
MRSA, PCR: NEGATIVE
Staphylococcus aureus: NEGATIVE

## 2013-10-07 LAB — PROTIME-INR
INR: 1.03 (ref 0.00–1.49)
Prothrombin Time: 13.3 seconds (ref 11.6–15.2)

## 2013-10-07 SURGERY — REPAIR, HERNIA, VENTRAL
Anesthesia: General

## 2013-10-07 MED ORDER — OXYCODONE HCL 5 MG PO TABS: 5.0000 mg | ORAL_TABLET | Freq: Once | ORAL | Status: DC | PRN

## 2013-10-07 MED ORDER — 0.9 % SODIUM CHLORIDE (POUR BTL) OPTIME
TOPICAL | Status: DC | PRN
  Administered 2013-10-07: 1000 mL

## 2013-10-07 MED ORDER — MORPHINE SULFATE (PF) 1 MG/ML IV SOLN
INTRAVENOUS | Status: DC
  Administered 2013-10-07: 06:00:00 via INTRAVENOUS

## 2013-10-07 MED ORDER — ONDANSETRON HCL 4 MG/2ML IJ SOLN
4.0000 mg | Freq: Once | INTRAMUSCULAR | Status: AC
  Administered 2013-10-07: 4 mg via INTRAVENOUS
  Filled 2013-10-07: qty 2

## 2013-10-07 MED ORDER — ONDANSETRON HCL 4 MG/2ML IJ SOLN
INTRAMUSCULAR | Status: DC | PRN
  Administered 2013-10-07: 4 mg via INTRAVENOUS

## 2013-10-07 MED ORDER — CALCIUM CITRATE-VITAMIN D 315-200 MG-UNIT PO TABS: 1.0000 | ORAL_TABLET | Freq: Two times a day (BID) | ORAL | Status: DC

## 2013-10-07 MED ORDER — DIPHENHYDRAMINE HCL 50 MG/ML IJ SOLN: 12.5000 mg | Freq: Four times a day (QID) | INTRAMUSCULAR | Status: DC | PRN

## 2013-10-07 MED ORDER — LISINOPRIL-HYDROCHLOROTHIAZIDE 20-25 MG PO TABS: 1.0000 | ORAL_TABLET | Freq: Every day | ORAL | Status: DC

## 2013-10-07 MED ORDER — METOCLOPRAMIDE HCL 5 MG/ML IJ SOLN: 10.0000 mg | Freq: Once | INTRAMUSCULAR | Status: DC | PRN

## 2013-10-07 MED ORDER — LACTATED RINGERS IV SOLN
INTRAVENOUS | Status: DC | PRN
  Administered 2013-10-07 (×2): via INTRAVENOUS

## 2013-10-07 MED ORDER — ROCURONIUM BROMIDE 100 MG/10ML IV SOLN
INTRAVENOUS | Status: DC | PRN
  Administered 2013-10-07: 10 mg via INTRAVENOUS
  Administered 2013-10-07: 30 mg via INTRAVENOUS

## 2013-10-07 MED ORDER — OXYCODONE-ACETAMINOPHEN 5-325 MG PO TABS
1.0000 | ORAL_TABLET | ORAL | Status: DC | PRN
Start: 2013-10-07 — End: 2013-10-09

## 2013-10-07 MED ORDER — ONDANSETRON HCL 4 MG/2ML IJ SOLN: 4.0000 mg | Freq: Four times a day (QID) | INTRAMUSCULAR | Status: DC | PRN

## 2013-10-07 MED ORDER — OXYCODONE HCL 5 MG/5ML PO SOLN: 5.0000 mg | Freq: Once | ORAL | Status: DC | PRN

## 2013-10-07 MED ORDER — NALOXONE HCL 0.4 MG/ML IJ SOLN: 0.4000 mg | INTRAMUSCULAR | Status: DC | PRN

## 2013-10-07 MED ORDER — CALCIUM CARBONATE-VITAMIN D 500-200 MG-UNIT PO TABS
1.0000 | ORAL_TABLET | Freq: Two times a day (BID) | ORAL | Status: DC
  Administered 2013-10-07 – 2013-10-09 (×5): 1 via ORAL
  Filled 2013-10-07 (×6): qty 1

## 2013-10-07 MED ORDER — HYDROMORPHONE HCL PF 1 MG/ML IJ SOLN
0.2500 mg | INTRAMUSCULAR | Status: DC | PRN
  Administered 2013-10-07 (×2): 0.5 mg via INTRAVENOUS

## 2013-10-07 MED ORDER — LIDOCAINE HCL (CARDIAC) 20 MG/ML IV SOLN
INTRAVENOUS | Status: DC | PRN
  Administered 2013-10-07: 100 mg via INTRAVENOUS

## 2013-10-07 MED ORDER — CALCIUM CITRATE-VITAMIN D 500-400 MG-UNIT PO CHEW
1.0000 | CHEWABLE_TABLET | Freq: Two times a day (BID) | ORAL | Status: DC
  Filled 2013-10-07 (×2): qty 1

## 2013-10-07 MED ORDER — SODIUM CHLORIDE 0.9 % IV SOLN
3.0000 g | INTRAVENOUS | Status: AC
  Filled 2013-10-07: qty 3

## 2013-10-07 MED ORDER — GLYCOPYRROLATE 0.2 MG/ML IJ SOLN
INTRAMUSCULAR | Status: DC | PRN
  Administered 2013-10-07: .7 mg via INTRAVENOUS

## 2013-10-07 MED ORDER — SODIUM CHLORIDE 0.9 % IV SOLN
3.0000 g | INTRAVENOUS | Status: DC | PRN
  Administered 2013-10-07: 3 g via INTRAVENOUS

## 2013-10-07 MED ORDER — KCL IN DEXTROSE-NACL 20-5-0.45 MEQ/L-%-% IV SOLN
INTRAVENOUS | Status: DC
  Filled 2013-10-07 (×2): qty 1000

## 2013-10-07 MED ORDER — SUCCINYLCHOLINE CHLORIDE 20 MG/ML IJ SOLN
INTRAMUSCULAR | Status: DC | PRN
  Administered 2013-10-07: 140 mg via INTRAVENOUS

## 2013-10-07 MED ORDER — ALBUTEROL SULFATE HFA 108 (90 BASE) MCG/ACT IN AERS
INHALATION_SPRAY | RESPIRATORY_TRACT | Status: DC | PRN
  Administered 2013-10-07: 1 via RESPIRATORY_TRACT
  Administered 2013-10-07: 2 via RESPIRATORY_TRACT

## 2013-10-07 MED ORDER — HYDROCHLOROTHIAZIDE 25 MG PO TABS
25.0000 mg | ORAL_TABLET | Freq: Every day | ORAL | Status: DC
  Administered 2013-10-07 – 2013-10-08 (×2): 25 mg via ORAL
  Filled 2013-10-07 (×3): qty 1

## 2013-10-07 MED ORDER — ENOXAPARIN SODIUM 40 MG/0.4ML ~~LOC~~ SOLN
40.0000 mg | Freq: Every day | SUBCUTANEOUS | Status: DC
  Administered 2013-10-07 – 2013-10-08 (×2): 40 mg via SUBCUTANEOUS
  Filled 2013-10-07 (×3): qty 0.4

## 2013-10-07 MED ORDER — MORPHINE SULFATE (PF) 1 MG/ML IV SOLN
INTRAVENOUS | Status: AC
  Filled 2013-10-07: qty 25

## 2013-10-07 MED ORDER — DEXAMETHASONE SODIUM PHOSPHATE 4 MG/ML IJ SOLN
INTRAMUSCULAR | Status: DC | PRN
  Administered 2013-10-07: 4 mg via INTRAVENOUS

## 2013-10-07 MED ORDER — EPHEDRINE SULFATE 50 MG/ML IJ SOLN
INTRAMUSCULAR | Status: DC | PRN
  Administered 2013-10-07 (×2): 10 mg via INTRAVENOUS

## 2013-10-07 MED ORDER — MORPHINE SULFATE 2 MG/ML IJ SOLN: 2.0000 mg | INTRAMUSCULAR | Status: DC | PRN

## 2013-10-07 MED ORDER — NIFEDIPINE ER 30 MG PO TB24
30.0000 mg | ORAL_TABLET | Freq: Every day | ORAL | Status: DC
  Administered 2013-10-07 – 2013-10-08 (×2): 30 mg via ORAL
  Filled 2013-10-07 (×3): qty 1

## 2013-10-07 MED ORDER — KCL IN DEXTROSE-NACL 20-5-0.45 MEQ/L-%-% IV SOLN
INTRAVENOUS | Status: DC
  Administered 2013-10-07: 100 mL/h via INTRAVENOUS
  Administered 2013-10-07 – 2013-10-08 (×2): via INTRAVENOUS
  Filled 2013-10-07 (×4): qty 1000

## 2013-10-07 MED ORDER — POVIDONE-IODINE 10 % EX OINT
TOPICAL_OINTMENT | CUTANEOUS | Status: DC | PRN
  Administered 2013-10-07: 1 via TOPICAL

## 2013-10-07 MED ORDER — SODIUM CHLORIDE 0.9 % IJ SOLN: 9.0000 mL | INTRAMUSCULAR | Status: DC | PRN

## 2013-10-07 MED ORDER — POVIDONE-IODINE 10 % EX OINT
TOPICAL_OINTMENT | CUTANEOUS | Status: AC
  Filled 2013-10-07: qty 28.35

## 2013-10-07 MED ORDER — PANTOPRAZOLE SODIUM 40 MG IV SOLR
40.0000 mg | Freq: Every day | INTRAVENOUS | Status: DC
  Administered 2013-10-07: 40 mg via INTRAVENOUS
  Filled 2013-10-07 (×2): qty 40

## 2013-10-07 MED ORDER — ENOXAPARIN SODIUM 40 MG/0.4ML ~~LOC~~ SOLN
40.0000 mg | SUBCUTANEOUS | Status: DC
  Filled 2013-10-07: qty 0.4

## 2013-10-07 MED ORDER — PROPOFOL 10 MG/ML IV BOLUS
INTRAVENOUS | Status: DC | PRN
  Administered 2013-10-07: 200 mg via INTRAVENOUS

## 2013-10-07 MED ORDER — HYDROMORPHONE HCL PF 1 MG/ML IJ SOLN
INTRAMUSCULAR | Status: AC
  Filled 2013-10-07: qty 1

## 2013-10-07 MED ORDER — LISINOPRIL 20 MG PO TABS
20.0000 mg | ORAL_TABLET | Freq: Every day | ORAL | Status: DC
  Administered 2013-10-07 – 2013-10-08 (×2): 20 mg via ORAL
  Filled 2013-10-07 (×3): qty 1

## 2013-10-07 MED ORDER — ATORVASTATIN CALCIUM 20 MG PO TABS
20.0000 mg | ORAL_TABLET | Freq: Every day | ORAL | Status: DC
  Administered 2013-10-07 – 2013-10-09 (×3): 20 mg via ORAL
  Filled 2013-10-07 (×3): qty 1

## 2013-10-07 MED ORDER — DIPHENHYDRAMINE HCL 12.5 MG/5ML PO ELIX
12.5000 mg | ORAL_SOLUTION | Freq: Four times a day (QID) | ORAL | Status: DC | PRN
  Filled 2013-10-07: qty 5

## 2013-10-07 MED ORDER — SODIUM CHLORIDE 0.9 % IV SOLN
INTRAVENOUS | Status: DC | PRN
  Administered 2013-10-07: 03:00:00 via INTRAVENOUS

## 2013-10-07 MED ORDER — ALBUTEROL SULFATE HFA 108 (90 BASE) MCG/ACT IN AERS
1.0000 | INHALATION_SPRAY | Freq: Four times a day (QID) | RESPIRATORY_TRACT | Status: DC | PRN
  Administered 2013-10-08: 2 via RESPIRATORY_TRACT
  Filled 2013-10-07: qty 6.7

## 2013-10-07 MED ORDER — GUAIFENESIN 100 MG/5ML PO LIQD
200.0000 mg | Freq: Three times a day (TID) | ORAL | Status: DC | PRN
  Filled 2013-10-07: qty 10

## 2013-10-07 MED ORDER — FENTANYL CITRATE 0.05 MG/ML IJ SOLN
INTRAMUSCULAR | Status: DC | PRN
  Administered 2013-10-07 (×5): 50 ug via INTRAVENOUS

## 2013-10-07 MED ORDER — ARTIFICIAL TEARS OP OINT
TOPICAL_OINTMENT | OPHTHALMIC | Status: DC | PRN
  Administered 2013-10-07: 1 via OPHTHALMIC

## 2013-10-07 MED ORDER — NEOSTIGMINE METHYLSULFATE 1 MG/ML IJ SOLN
INTRAMUSCULAR | Status: DC | PRN
  Administered 2013-10-07: 5 mg via INTRAVENOUS

## 2013-10-07 SURGICAL SUPPLY — 50 items
BAG DECANTER FOR FLEXI CONT (MISCELLANEOUS) ×1 IMPLANT
BINDER ABD UNIV 10 28-50 (GAUZE/BANDAGES/DRESSINGS) IMPLANT
BINDER ABD UNIV 12 45-62 (WOUND CARE) ×1 IMPLANT
BINDER ABDOM UNIV 10 (GAUZE/BANDAGES/DRESSINGS) ×2
BINDER ABDOMINAL 46IN 62IN (WOUND CARE) ×2
BLADE SURG ROTATE 9660 (MISCELLANEOUS) IMPLANT
CANISTER SUCTION 2500CC (MISCELLANEOUS) ×2 IMPLANT
CHLORAPREP W/TINT 26ML (MISCELLANEOUS) ×2 IMPLANT
COVER SURGICAL LIGHT HANDLE (MISCELLANEOUS) ×2 IMPLANT
DRAPE LAPAROSCOPIC ABDOMINAL (DRAPES) ×2 IMPLANT
DRAPE UTILITY 15X26 W/TAPE STR (DRAPE) ×4 IMPLANT
DRSG OPSITE POSTOP 4X8 (GAUZE/BANDAGES/DRESSINGS) ×1 IMPLANT
DRSG TEGADERM 2-3/8X2-3/4 SM (GAUZE/BANDAGES/DRESSINGS) ×1 IMPLANT
ELECT CAUTERY BLADE 6.4 (BLADE) ×2 IMPLANT
ELECT REM PT RETURN 9FT ADLT (ELECTROSURGICAL) ×2
ELECTRODE REM PT RTRN 9FT ADLT (ELECTROSURGICAL) ×1 IMPLANT
GLOVE BIOGEL PI IND STRL 6.5 (GLOVE) IMPLANT
GLOVE BIOGEL PI IND STRL 7.0 (GLOVE) IMPLANT
GLOVE BIOGEL PI IND STRL 8 (GLOVE) ×1 IMPLANT
GLOVE BIOGEL PI INDICATOR 6.5 (GLOVE) ×2
GLOVE BIOGEL PI INDICATOR 7.0 (GLOVE) ×1
GLOVE BIOGEL PI INDICATOR 8 (GLOVE) ×1
GLOVE ECLIPSE 7.5 STRL STRAW (GLOVE) ×2 IMPLANT
GLOVE SURG SS PI 6.5 STRL IVOR (GLOVE) ×2 IMPLANT
GOWN STRL NON-REIN LRG LVL3 (GOWN DISPOSABLE) ×6 IMPLANT
KIT BASIN OR (CUSTOM PROCEDURE TRAY) ×2 IMPLANT
KIT ROOM TURNOVER OR (KITS) ×2 IMPLANT
NDL HYPO 25GX1X1/2 BEV (NEEDLE) ×1 IMPLANT
NEEDLE HYPO 25GX1X1/2 BEV (NEEDLE) IMPLANT
NS IRRIG 1000ML POUR BTL (IV SOLUTION) ×2 IMPLANT
PACK GENERAL/GYN (CUSTOM PROCEDURE TRAY) ×2 IMPLANT
PAD ARMBOARD 7.5X6 YLW CONV (MISCELLANEOUS) ×2 IMPLANT
SPONGE GAUZE 4X4 12PLY (GAUZE/BANDAGES/DRESSINGS) ×2 IMPLANT
STAPLER VISISTAT 35W (STAPLE) ×1 IMPLANT
SUT ETHIBOND NAB CTX #1 30IN (SUTURE) IMPLANT
SUT MNCRL AB 4-0 PS2 18 (SUTURE) ×1 IMPLANT
SUT NOVA 1 T20/GS 25DT (SUTURE) ×2 IMPLANT
SUT NOVA T20/GS 25 (SUTURE) ×1 IMPLANT
SUT PROLENE 1 CT (SUTURE) IMPLANT
SUT SILK 2 0 (SUTURE)
SUT SILK 2-0 18XBRD TIE 12 (SUTURE) ×1 IMPLANT
SUT VIC AB 2-0 CT1 27 (SUTURE) ×4
SUT VIC AB 2-0 CT1 TAPERPNT 27 (SUTURE) IMPLANT
SUT VIC AB 3-0 CT1 27 (SUTURE) ×4
SUT VIC AB 3-0 CT1 TAPERPNT 27 (SUTURE) IMPLANT
SUT VICRYL AB 3 0 TIES (SUTURE) IMPLANT
SYR CONTROL 10ML LL (SYRINGE) IMPLANT
TOWEL OR 17X24 6PK STRL BLUE (TOWEL DISPOSABLE) ×2 IMPLANT
TOWEL OR 17X26 10 PK STRL BLUE (TOWEL DISPOSABLE) ×2 IMPLANT
TRAY FOLEY CATH 14FRSI W/METER (CATHETERS) ×1 IMPLANT

## 2013-10-07 NOTE — ED Notes (Signed)
Surgeon at bedside.  

## 2013-10-07 NOTE — H&P (Signed)
Bridget Lamb is an 57 y.o. female.   Chief Complaint: Abdominal pain  HPI: The patient comes in with a 1/2-2 day history of abdominal pain, nausea, vomiting. This falls a day where she has significant coughing secondary to recent cold that she had developed.  Clinically she had an incarcerated periumbilical ventral hernia. A 10 by the emergency room physician and by the surgeon were made to reduce the hernia and this was unsuccessful. She's not about her hernia for several years however it has never been symptomatic. She comes in with discomfort nausea vomiting and a CT scan demonstrates incarcerated periumbilical ventral hernia with a large amount of omentum, and part of her transverse colon which is obstructed in the hernia. There does not appear to be any evidence of perforation.  Past Medical History  Diagnosis Date  . Hypertension   . Allergic rhinitis   . Obesity   . Dyslipidemia   . Umbilical hernia   . History of thyroid cancer   . CHF (congestive heart failure) 2004  . H/O echocardiogram 01/07/04    mild LVH, nl LV systolic function, EF 60%, left atrial enlargement, mildly thickened aortic and mitral valves  . Osteopenia     BMD 02/27/08   . Anemia     hematology, Dr. Arbutus Ped, last 01/2011  . Osteopenia     Past Surgical History  Procedure Laterality Date  . Thyroid surgery      partial thyroidectomy  . Cholecystectomy  2003  . Cardiac catheterization  2005  . Colonoscopy  2008  . Bone marrow biopsy    . Total thyroidectomy  2009    Family History  Problem Relation Age of Onset  . Cancer Mother   . Hypertension Mother    Social History:  reports that she has never smoked. She does not have any smokeless tobacco history on file. She reports that she does not drink alcohol or use illicit drugs.  Allergies: No Known Allergies   (Not in a hospital admission)  Results for orders placed during the hospital encounter of 10/06/13 (from the past 48 hour(s))    URINALYSIS, ROUTINE W REFLEX MICROSCOPIC     Status: Abnormal   Collection Time    10/06/13  5:52 PM      Result Value Range   Color, Urine AMBER (*) YELLOW   Comment: BIOCHEMICALS MAY BE AFFECTED BY COLOR   APPearance CLOUDY (*) CLEAR   Specific Gravity, Urine 1.024  1.005 - 1.030   pH 5.5  5.0 - 8.0   Glucose, UA NEGATIVE  NEGATIVE mg/dL   Hgb urine dipstick NEGATIVE  NEGATIVE   Bilirubin Urine SMALL (*) NEGATIVE   Ketones, ur NEGATIVE  NEGATIVE mg/dL   Protein, ur 30 (*) NEGATIVE mg/dL   Urobilinogen, UA 1.0  0.0 - 1.0 mg/dL   Nitrite NEGATIVE  NEGATIVE   Leukocytes, UA MODERATE (*) NEGATIVE  URINE MICROSCOPIC-ADD ON     Status: Abnormal   Collection Time    10/06/13  5:52 PM      Result Value Range   Squamous Epithelial / LPF MANY (*) RARE   WBC, UA 21-50  <3 WBC/hpf   Bacteria, UA FEW (*) RARE   Casts HYALINE CASTS (*) NEGATIVE   Comment: GRANULAR CAST  CBC WITH DIFFERENTIAL     Status: Abnormal   Collection Time    10/06/13  6:05 PM      Result Value Range   WBC 10.7 (*) 4.0 - 10.5 K/uL  RBC 3.67 (*) 3.87 - 5.11 MIL/uL   Hemoglobin 11.6 (*) 12.0 - 15.0 g/dL   HCT 16.1 (*) 09.6 - 04.5 %   MCV 93.7  78.0 - 100.0 fL   MCH 31.6  26.0 - 34.0 pg   MCHC 33.7  30.0 - 36.0 g/dL   RDW 40.9  81.1 - 91.4 %   Platelets 228  150 - 400 K/uL   Neutrophils Relative % 78 (*) 43 - 77 %   Neutro Abs 8.4 (*) 1.7 - 7.7 K/uL   Lymphocytes Relative 16  12 - 46 %   Lymphs Abs 1.7  0.7 - 4.0 K/uL   Monocytes Relative 5  3 - 12 %   Monocytes Absolute 0.5  0.1 - 1.0 K/uL   Eosinophils Relative 1  0 - 5 %   Eosinophils Absolute 0.1  0.0 - 0.7 K/uL   Basophils Relative 0  0 - 1 %   Basophils Absolute 0.0  0.0 - 0.1 K/uL  COMPREHENSIVE METABOLIC PANEL     Status: Abnormal   Collection Time    10/06/13  6:05 PM      Result Value Range   Sodium 141  135 - 145 mEq/L   Potassium 4.4  3.5 - 5.1 mEq/L   Chloride 101  96 - 112 mEq/L   CO2 30  19 - 32 mEq/L   Glucose, Bld 102 (*) 70 - 99  mg/dL   BUN 15  6 - 23 mg/dL   Creatinine, Ser 7.82  0.50 - 1.10 mg/dL   Calcium 9.8  8.4 - 95.6 mg/dL   Total Protein 8.0  6.0 - 8.3 g/dL   Albumin 3.9  3.5 - 5.2 g/dL   AST 19  0 - 37 U/L   ALT 12  0 - 35 U/L   Alkaline Phosphatase 102  39 - 117 U/L   Total Bilirubin 0.5  0.3 - 1.2 mg/dL   GFR calc non Af Amer 68 (*) >90 mL/min   GFR calc Af Amer 79 (*) >90 mL/min   Comment: (NOTE)     The eGFR has been calculated using the CKD EPI equation.     This calculation has not been validated in all clinical situations.     eGFR's persistently <90 mL/min signify possible Chronic Kidney     Disease.  LIPASE, BLOOD     Status: None   Collection Time    10/06/13  6:05 PM      Result Value Range   Lipase 25  11 - 59 U/L   Ct Abdomen Pelvis Wo Contrast  10/07/2013   CLINICAL DATA:  Known periumbilical hernia, now with abdominal pain and nausea, diarrhea, and vomiting. Unsuccessful attempt at closed reduction. History of thyroid cancer.  EXAM: CT ABDOMEN AND PELVIS WITHOUT CONTRAST  TECHNIQUE: Multidetector CT imaging of the abdomen and pelvis was performed following the standard protocol without intravenous contrast.  COMPARISON:  None.  FINDINGS: Clear lung bases with cardiomegaly. Coronary artery calcification is present.  Unenhanced appearance of the liver grossly unremarkable. Ill-defined hypo attenuating area approximately 1 cm in size in the spleen of uncertain significance on this noncontrast examination. No gallstones or biliary ductal dilatation. Normal pancreas. No renal obstruction or obvious masses. No visible calculi. Small hiatal hernia. Normal small bowel. Normal appendix.  In the mid abdomen, a loop of transverse colon has become lodged within the umbilical hernia. There is some bowel wall edema but no definite perforation. The ascending and proximal  transverse colon are distended implying significant obstruction. The umbilical hernia also contains a significant amount of fat. There may  be a twist of the colon at this location. The descending and sigmoid colon are of normal caliber.  No pelvic masses.  No free fluid or free air.  No osseous lesions.  IMPRESSION: Possible incarcerated loop of transverse colon in an umbilical hernia. Proximal colonic obstruction is suspected. Surgical consultation is warranted. Findings discussed with ordering provider.   Electronically Signed   By: Davonna Belling M.D.   On: 10/07/2013 00:40    Review of Systems  Constitutional: Negative for fever and chills.  Respiratory: Positive for cough and sputum production.   Gastrointestinal: Positive for nausea, vomiting and abdominal pain.  All other systems reviewed and are negative.    Blood pressure 134/49, pulse 74, temperature 97.3 F (36.3 C), temperature source Oral, resp. rate 18, weight 110.36 kg (243 lb 4.8 oz), last menstrual period 10/22/2011, SpO2 100.00%. Physical Exam  Constitutional: She is oriented to person, place, and time. She appears well-developed.  Obese  HENT:  Head: Normocephalic and atraumatic.  Eyes: Conjunctivae and EOM are normal. Pupils are equal, round, and reactive to light.  Neck: Normal range of motion. Neck supple.  Cardiovascular: Normal rate, regular rhythm and normal heart sounds.   Respiratory: Effort normal and breath sounds normal.  GI: Soft. Bowel sounds are normal. She exhibits distension. There is tenderness in the periumbilical area. There is no rigidity, no rebound and no guarding. A hernia is present. Hernia confirmed positive in the ventral area (periumbilical).    Musculoskeletal: Normal range of motion.  Neurological: She is alert and oriented to person, place, and time. She has normal reflexes.  Skin: Skin is warm and dry.  Psychiatric: She has a normal mood and affect. Her behavior is normal. Judgment and thought content normal.     Assessment/Plan Incarcerated periumbilical ventral hernia. Attempts to reduce this were unsuccessful. The  patient does not have peritonitis.  And closed within the hernia is a loop of her transverse colon causing obstruction. Loop of bowel appears to be viable on CT scan however this cannot be assumed based solely on the CT scan. Patient his expiration as soon as possible for reduction and repair of her periumbilical ventral hernia. More than likely we will not use mesh because of the risk of infection. She is nondiabetic however she has other medical problems including congestive heart failure and hypertension.  The patient will get perioperative antibiotics. Currently I have ordered Unasyn.  Cherylynn Ridges 10/07/2013, 1:48 AM

## 2013-10-07 NOTE — Anesthesia Procedure Notes (Signed)
Procedure Name: Intubation Date/Time: 10/07/2013 3:26 AM Performed by: Julianne Rice Z Pre-anesthesia Checklist: Patient identified, Timeout performed, Emergency Drugs available, Suction available and Patient being monitored Patient Re-evaluated:Patient Re-evaluated prior to inductionOxygen Delivery Method: Circle system utilized Preoxygenation: Pre-oxygenation with 100% oxygen Intubation Type: IV induction, Rapid sequence and Cricoid Pressure applied Laryngoscope Size: Mac and 3 Grade View: Grade I Tube type: Oral Tube size: 7.5 mm Number of attempts: 1 Airway Equipment and Method: Stylet Placement Confirmation: ETT inserted through vocal cords under direct vision,  breath sounds checked- equal and bilateral and positive ETCO2 Secured at: 22 cm Tube secured with: Tape Dental Injury: Teeth and Oropharynx as per pre-operative assessment

## 2013-10-07 NOTE — Progress Notes (Signed)
Day of Surgery  Subjective: Sore, but feeling better; minimal PCA use Tolerating clears  Objective: Vital signs in last 24 hours: Temp:  [97.3 F (36.3 C)-98.9 F (37.2 C)] 98.2 F (36.8 C) (12/14 0745) Pulse Rate:  [63-84] 69 (12/14 0800) Resp:  [11-22] 20 (12/14 0800) BP: (103-163)/(43-73) 126/49 mmHg (12/14 0800) SpO2:  [91 %-100 %] 98 % (12/14 0800) FiO2 (%):  [50 %] 50 % (12/14 0745) Weight:  [243 lb 4.8 oz (110.36 kg)-248 lb 0.3 oz (112.5 kg)] 248 lb 0.3 oz (112.5 kg) (12/14 0630) Last BM Date: 10/05/13  Intake/Output from previous day: 12/13 0701 - 12/14 0700 In: 2000 [I.V.:2000] Out: 250 [Urine:220; Blood:30] Intake/Output this shift:    General appearance: alert, cooperative and no distress GI: soft, appropriate incisional tenderness Dressing dry  Lab Results:   Recent Labs  10/06/13 1805 10/07/13 0650  WBC 10.7* 11.4*  HGB 11.6* 10.6*  HCT 34.4* 32.1*  PLT 228 184   BMET  Recent Labs  10/06/13 1805 10/07/13 0650  NA 141 139  K 4.4 4.8  CL 101 103  CO2 30 31  GLUCOSE 102* 131*  BUN 15 16  CREATININE 0.91 0.87  CALCIUM 9.8 8.7   PT/INR  Recent Labs  10/07/13 0650  LABPROT 13.3  INR 1.03   ABG No results found for this basename: PHART, PCO2, PO2, HCO3,  in the last 72 hours  Studies/Results: Ct Abdomen Pelvis Wo Contrast  10/07/2013   CLINICAL DATA:  Known periumbilical hernia, now with abdominal pain and nausea, diarrhea, and vomiting. Unsuccessful attempt at closed reduction. History of thyroid cancer.  EXAM: CT ABDOMEN AND PELVIS WITHOUT CONTRAST  TECHNIQUE: Multidetector CT imaging of the abdomen and pelvis was performed following the standard protocol without intravenous contrast.  COMPARISON:  None.  FINDINGS: Clear lung bases with cardiomegaly. Coronary artery calcification is present.  Unenhanced appearance of the liver grossly unremarkable. Ill-defined hypo attenuating area approximately 1 cm in size in the spleen of uncertain  significance on this noncontrast examination. No gallstones or biliary ductal dilatation. Normal pancreas. No renal obstruction or obvious masses. No visible calculi. Small hiatal hernia. Normal small bowel. Normal appendix.  In the mid abdomen, a loop of transverse colon has become lodged within the umbilical hernia. There is some bowel wall edema but no definite perforation. The ascending and proximal transverse colon are distended implying significant obstruction. The umbilical hernia also contains a significant amount of fat. There may be a twist of the colon at this location. The descending and sigmoid colon are of normal caliber.  No pelvic masses.  No free fluid or free air.  No osseous lesions.  IMPRESSION: Possible incarcerated loop of transverse colon in an umbilical hernia. Proximal colonic obstruction is suspected. Surgical consultation is warranted. Findings discussed with ordering provider.   Electronically Signed   By: Davonna Belling M.D.   On: 10/07/2013 00:40    Anti-infectives: Anti-infectives   Start     Dose/Rate Route Frequency Ordered Stop   10/07/13 0145  [MAR Hold]  Ampicillin-Sulbactam (UNASYN) 3 g in sodium chloride 0.9 % 100 mL IVPB     (On MAR Hold since 10/07/13 0315)   3 g 100 mL/hr over 60 Minutes Intravenous On call to O.R. 10/07/13 0141 10/07/13 0559      Assessment/Plan: s/p Procedure(s): HERNIA REPAIR VENTRAL ADULT (N/A) Advance diet Stepdown today - probably move to floor tomorrow Remove Foley in AM   LOS: 1 day    Jarred Purtee K. 10/07/2013

## 2013-10-07 NOTE — Anesthesia Preprocedure Evaluation (Addendum)
Anesthesia Evaluation  Patient identified by MRN, date of birth, ID band Patient awake    Reviewed: Allergy & Precautions, H&P , NPO status , Patient's Chart, lab work & pertinent test results, reviewed documented beta blocker date and time   Airway Mallampati: II TM Distance: >3 FB Neck ROM: full    Dental  (+) Edentulous Upper, Poor Dentition and Dental Advisory Given,    Pulmonary COPD COPD inhaler,  breath sounds clear to auscultation        Cardiovascular hypertension, On Medications +CHF Rhythm:regular     Neuro/Psych negative neurological ROS  negative psych ROS   GI/Hepatic negative GI ROS, Neg liver ROS,   Endo/Other  Hypothyroidism Morbid obesity  Renal/GU negative Renal ROS  negative genitourinary   Musculoskeletal   Abdominal   Peds  Hematology  (+) Blood dyscrasia, anemia ,   Anesthesia Other Findings See surgeon's H&P   Reproductive/Obstetrics negative OB ROS                        Anesthesia Physical Anesthesia Plan  ASA: III and emergent  Anesthesia Plan: General   Post-op Pain Management:    Induction: Intravenous, Rapid sequence and Cricoid pressure planned  Airway Management Planned: Oral ETT  Additional Equipment:   Intra-op Plan:   Post-operative Plan: Extubation in OR  Informed Consent: I have reviewed the patients History and Physical, chart, labs and discussed the procedure including the risks, benefits and alternatives for the proposed anesthesia with the patient or authorized representative who has indicated his/her understanding and acceptance.   Dental Advisory Given  Plan Discussed with: CRNA and Surgeon  Anesthesia Plan Comments:         Anesthesia Quick Evaluation

## 2013-10-07 NOTE — ED Notes (Signed)
Pt transported upstairs to OR on tele. No issues. Unasyn and Paperwork given to CRNA. Belongings transported upstairs.

## 2013-10-07 NOTE — Op Note (Addendum)
OPERATIVE REPORT  DATE OF OPERATION: 10/07/2013  PATIENT:  Bridget Lamb  57 y.Lamb. female  PRE-OPERATIVE DIAGNOSIS:  Incarcerated ventral/umbilical hernia  POST-OPERATIVE DIAGNOSIS:  Incarcerated ventral/umbilical hernia with incarcerated transverse colon and omentum  PROCEDURE:  Procedure(s): HERNIA REPAIR VENTRAL ADULT without mesh  SURGEON:  Surgeon(s): Cherylynn Ridges, MD  ASSISTANT: None  ANESTHESIA:   general  EBL: <50 ml  BLOOD ADMINISTERED: none  DRAINS: Urinary Catheter (Foley)   SPECIMEN:  Source of Specimen:  hernia sac and omentum  COUNTS CORRECT:  YES  PROCEDURE DETAILS: The patient was taken to the operating room and placed on the table in the supine position. After an adequate general endotracheal anesthetic was administered, she was prepped and draped in the usual sterile manner exposing her entire abdomen.  After proper time out was performed identifying the patient and the procedure to be performed, an infraumbilical 13 cm transverse incision was made using a #10 blade. This was taken down into the subcutaneous tissue using electrocautery. The patient had a large hernia with a moderate sized hernia defect. The upper portion of the hernia was intimately attached to the skin surrounding the umbilicus. Care was taken to dissect away the hernia sac from the skin at the umbilicus. We were able to dissect down around the hernia sac to the neck which measured approximately 8-9 cm in maximal diameter.  Once we had exposed the fascia at the junction of the hernia with the fascia, we entered the hernia sac at the base. Electrocautery was used to obtain hemostasis at that site. Once we opened the sac we were able to split her transversely in order to expose is content without damaging them. A loop of transverse colon along with a large piece of omentum was noted to be stuck to the hernia sac intimately. Kelly clamps and 2-0 silk ties used to separate the omentum away from  the transverse colon and the portion of the bowel that was allowed to fall back into the peritoneal cavity. We resected a significant amount of omentum along with hernia sac and sent as a separate specimen.  Once the contents of fallen back into the peritoneal cavity we were able to close this 8-9 cm hernia defect using interrupted #1 Novafil simple stitches. Once we did this we used a running back-and-forth #1 Novafil stitch on top of the interrupted repair. No mesh was used.  We subsequently irrigated with saline solution. We reapproximated the deep subcutaneous tissue using a 2-0 Vicryl suture. We then used 3-0 Vicryl to reapproximate the more superficial subcutaneous tissue and also to help invert the umbilicus into the repair. Once this was done the skin was closed using stainless steel staples. A sterile dressing was applied and we placed a folded 4 x 4 in the umbilicus to keep an inverted. All needle counts, sponge counts, and instrument counts were correct.  PATIENT DISPOSITION:  PACU - hemodynamically stable.   Bridget Lamb 12/14/20144:59 AM

## 2013-10-07 NOTE — Anesthesia Postprocedure Evaluation (Signed)
Anesthesia Post Note  Patient: Bridget Lamb  Procedure(s) Performed: Procedure(s) (LRB): HERNIA REPAIR VENTRAL ADULT (N/A)  Anesthesia type: General  Patient location: PACU  Post pain: Pain level controlled  Post assessment: Patient's Cardiovascular Status Stable  Last Vitals:  Filed Vitals:   10/07/13 0609  BP: 118/53  Pulse: 70  Temp: 36.4 C  Resp: 13    Post vital signs: Reviewed and stable  Level of consciousness: alert  Complications: No apparent anesthesia complications

## 2013-10-07 NOTE — Transfer of Care (Signed)
Immediate Anesthesia Transfer of Care Note  Patient: Bridget Lamb  Procedure(s) Performed: Procedure(s): HERNIA REPAIR VENTRAL ADULT (N/A)  Patient Location: PACU  Anesthesia Type:General  Level of Consciousness: awake, alert , oriented and patient cooperative  Airway & Oxygen Therapy: Patient Spontanous Breathing and Patient connected to face mask oxygen  Post-op Assessment: Report given to PACU RN and Post -op Vital signs reviewed and stable  Post vital signs: Reviewed and stable  Complications: No apparent anesthesia complications

## 2013-10-08 LAB — URINE CULTURE: Colony Count: 75000

## 2013-10-08 LAB — GLUCOSE, CAPILLARY: Glucose-Capillary: 100 mg/dL — ABNORMAL HIGH (ref 70–99)

## 2013-10-08 MED ORDER — PANTOPRAZOLE SODIUM 40 MG PO TBEC
40.0000 mg | DELAYED_RELEASE_TABLET | Freq: Every day | ORAL | Status: DC
  Administered 2013-10-08 – 2013-10-09 (×2): 40 mg via ORAL
  Filled 2013-10-08 (×2): qty 1

## 2013-10-08 MED ORDER — ACETAMINOPHEN 160 MG/5ML PO SOLN
650.0000 mg | Freq: Four times a day (QID) | ORAL | Status: DC | PRN
  Administered 2013-10-08: 650 mg via ORAL
  Filled 2013-10-08: qty 20.3

## 2013-10-08 NOTE — Progress Notes (Signed)
PIV infiltrated to RAFA. Stage 1. IVF's at 10CC/ HR with only one IV med (Protonix). Patient states not wanting IV restarted if protonix can be administered orally. Primary RN notified to contact MD for orders.

## 2013-10-08 NOTE — Progress Notes (Signed)
1 Day Post-Op  Subjective: Up in chair, passed some gas, tolerated reg diet last night  Objective: Vital signs in last 24 hours: Temp:  [97.6 F (36.4 C)-99.1 F (37.3 C)] 98.8 F (37.1 C) (12/15 0411) Pulse Rate:  [67-86] 86 (12/15 0400) Resp:  [11-23] 19 (12/15 0400) BP: (122-145)/(47-62) 142/62 mmHg (12/15 0400) SpO2:  [95 %-100 %] 96 % (12/15 0400) Last BM Date: 10/05/13  Intake/Output from previous day: 12/14 0701 - 12/15 0700 In: 3101.7 [P.O.:950; I.V.:2151.7] Out: 2950 [Urine:2950] Intake/Output this shift:    General appearance: alert and cooperative Resp: clear to auscultation bilaterally Cardio: regular rate and rhythm GI: soft, dressing intact, incision looks OK through dressing with iodine ointment, +BS  Lab Results:   Recent Labs  10/06/13 1805 10/07/13 0650  WBC 10.7* 11.4*  HGB 11.6* 10.6*  HCT 34.4* 32.1*  PLT 228 184   BMET  Recent Labs  10/06/13 1805 10/07/13 0650  NA 141 139  K 4.4 4.8  CL 101 103  CO2 30 31  GLUCOSE 102* 131*  BUN 15 16  CREATININE 0.91 0.87  CALCIUM 9.8 8.7   PT/INR  Recent Labs  10/07/13 0650  LABPROT 13.3  INR 1.03   ABG No results found for this basename: PHART, PCO2, PO2, HCO3,  in the last 72 hours  Studies/Results: Ct Abdomen Pelvis Wo Contrast  10/07/2013   CLINICAL DATA:  Known periumbilical hernia, now with abdominal pain and nausea, diarrhea, and vomiting. Unsuccessful attempt at closed reduction. History of thyroid cancer.  EXAM: CT ABDOMEN AND PELVIS WITHOUT CONTRAST  TECHNIQUE: Multidetector CT imaging of the abdomen and pelvis was performed following the standard protocol without intravenous contrast.  COMPARISON:  None.  FINDINGS: Clear lung bases with cardiomegaly. Coronary artery calcification is present.  Unenhanced appearance of the liver grossly unremarkable. Ill-defined hypo attenuating area approximately 1 cm in size in the spleen of uncertain significance on this noncontrast examination.  No gallstones or biliary ductal dilatation. Normal pancreas. No renal obstruction or obvious masses. No visible calculi. Small hiatal hernia. Normal small bowel. Normal appendix.  In the mid abdomen, a loop of transverse colon has become lodged within the umbilical hernia. There is some bowel wall edema but no definite perforation. The ascending and proximal transverse colon are distended implying significant obstruction. The umbilical hernia also contains a significant amount of fat. There may be a twist of the colon at this location. The descending and sigmoid colon are of normal caliber.  No pelvic masses.  No free fluid or free air.  No osseous lesions.  IMPRESSION: Possible incarcerated loop of transverse colon in an umbilical hernia. Proximal colonic obstruction is suspected. Surgical consultation is warranted. Findings discussed with ordering provider.   Electronically Signed   By: Davonna Belling M.D.   On: 10/07/2013 00:40    Anti-infectives: Anti-infectives   Start     Dose/Rate Route Frequency Ordered Stop   10/07/13 0145  [MAR Hold]  Ampicillin-Sulbactam (UNASYN) 3 g in sodium chloride 0.9 % 100 mL IVPB     (On MAR Hold since 10/07/13 0315)   3 g 100 mL/hr over 60 Minutes Intravenous On call to O.R. 10/07/13 0141 10/07/13 0559      Assessment/Plan: s/p Procedure(s): HERNIA REPAIR VENTRAL ADULT (N/A) POD#2 FEN - on reg diet, oral pain meds DIspo - to floor, possibly home tomorrow if +BM and pain controlled VTE - Lovenox  LOS: 2 days    Bridget Lamb E 10/08/2013

## 2013-10-09 ENCOUNTER — Encounter (HOSPITAL_COMMUNITY): Payer: Self-pay | Admitting: *Deleted

## 2013-10-09 ENCOUNTER — Encounter (HOSPITAL_COMMUNITY): Payer: Self-pay | Admitting: General Surgery

## 2013-10-09 DIAGNOSIS — E66813 Obesity, class 3: Secondary | ICD-10-CM

## 2013-10-09 DIAGNOSIS — E039 Hypothyroidism, unspecified: Secondary | ICD-10-CM

## 2013-10-09 DIAGNOSIS — I10 Essential (primary) hypertension: Secondary | ICD-10-CM

## 2013-10-09 HISTORY — DX: Morbid (severe) obesity due to excess calories: E66.01

## 2013-10-09 HISTORY — DX: Obesity, class 3: E66.813

## 2013-10-09 HISTORY — DX: Hypothyroidism, unspecified: E03.9

## 2013-10-09 HISTORY — DX: Essential (primary) hypertension: I10

## 2013-10-09 MED ORDER — LEVOTHYROXINE SODIUM 200 MCG PO TABS
200.0000 ug | ORAL_TABLET | Freq: Every day | ORAL | Status: DC
  Filled 2013-10-09: qty 1

## 2013-10-09 MED ORDER — ACETAMINOPHEN 325 MG PO TABS: 650.0000 mg | ORAL_TABLET | Freq: Four times a day (QID) | ORAL | Status: DC | PRN

## 2013-10-09 MED ORDER — OXYCODONE-ACETAMINOPHEN 5-325 MG PO TABS: 1.0000 | ORAL_TABLET | ORAL | Status: DC | PRN

## 2013-10-09 NOTE — Progress Notes (Signed)
Agree Patient examined and I agree with the assessment and plan  Violeta Gelinas, MD, MPH, FACS Pager: [phone]  10/09/2013 12:39 PM

## 2013-10-09 NOTE — Progress Notes (Signed)
Discharge instructions and prescription given to pt.  Pt. Verbalized understanding of all discharge instructions. Return to work note was also given to pt.  Pt. Discharged to home with family. Vanice Sarah

## 2013-10-09 NOTE — Discharge Summary (Signed)
Bridget Wassink, MD, MPH, FACS Pager: [phone]  

## 2013-10-09 NOTE — Progress Notes (Signed)
2 Days Post-Op  Subjective: Up in chair, and ate a regular breakfast.  Small BM, reported.  Objective: Vital signs in last 24 hours: Temp:  [98.9 F (37.2 C)-99.5 F (37.5 C)] 99.3 F (37.4 C) (12/16 0542) Pulse Rate:  [88-100] 100 (12/16 0542) Resp:  [18-20] 18 (12/16 0542) BP: (118-145)/(45-68) 130/60 mmHg (12/16 1007) SpO2:  [93 %-100 %] 95 % (12/16 0542) Last BM Date: 10/08/13 240 PO recorded yesterday Afebrile, VSS No labs Regular diet, No BM recorded. Intake/Output from previous day: 12/15 0701 - 12/16 0700 In: 517.3 [P.O.:240; I.V.:277.3] Out: -  Intake/Output this shift:    General appearance: alert, cooperative and no distress Resp: clear to auscultation bilaterally and down some in the bases. GI: soft, sore few BS. she has an area about 1 cm in diameter over the umbilicus that looks like a blister that has drained, with skin that will eventually come off.    Lab Results:   Recent Labs  10/06/13 1805 10/07/13 0650  WBC 10.7* 11.4*  HGB 11.6* 10.6*  HCT 34.4* 32.1*  PLT 228 184    BMET  Recent Labs  10/06/13 1805 10/07/13 0650  NA 141 139  K 4.4 4.8  CL 101 103  CO2 30 31  GLUCOSE 102* 131*  BUN 15 16  CREATININE 0.91 0.87  CALCIUM 9.8 8.7   PT/INR  Recent Labs  10/07/13 0650  LABPROT 13.3  INR 1.03     Recent Labs Lab 10/06/13 1805  AST 19  ALT 12  ALKPHOS 102  BILITOT 0.5  PROT 8.0  ALBUMIN 3.9     Lipase     Component Value Date/Time   LIPASE 25 10/06/2013 1805     Studies/Results: No results found.  Medications: . atorvastatin  20 mg Oral Daily  . calcium-vitamin D  1 tablet Oral BID  . enoxaparin (LOVENOX) injection  40 mg Subcutaneous QHS  . hydrochlorothiazide  25 mg Oral Daily  . lisinopril  20 mg Oral Daily  . NIFEdipine  30 mg Oral Daily  . pantoprazole  40 mg Oral Daily   Prior to Admission medications   Medication Sig Start Date End Date Taking? Authorizing Provider  albuterol (PROVENTIL  HFA;VENTOLIN HFA) 108 (90 BASE) MCG/ACT inhaler Inhale 1-2 puffs into the lungs every 6 (six) hours as needed for wheezing or shortness of breath.   Yes Historical Provider, MD  aspirin 81 MG tablet Take 81 mg by mouth daily.   Yes Historical Provider, MD  atorvastatin (LIPITOR) 20 MG tablet Take 20 mg by mouth daily.   Yes Historical Provider, MD  calcium citrate-vitamin D (CITRACAL+D) 315-200 MG-UNIT per tablet Take 1 tablet by mouth 2 (two) times daily.   Yes Historical Provider, MD  Chlorphen-Phenyleph-APAP (CORICIDIN D COLD/FLU/SINUS) 2-5-325 MG TABS Take 2 tablets by mouth daily as needed (for cold/flu symptoms).   Yes Historical Provider, MD  FeFum-FePoly-FA-B Cmp-C-Biot (INTEGRA PLUS) CAPS Take 1 capsule by mouth daily.   Yes Historical Provider, MD  guaiFENesin (MUCINEX FOR KIDS) 100 MG/5ML liquid Take 200 mg by mouth 3 (three) times daily as needed for cough.   Yes Historical Provider, MD  levothyroxine (SYNTHROID, LEVOTHROID) 200 MCG tablet Take 200 mcg by mouth daily before breakfast.   Yes Historical Provider, MD  lisinopril-hydrochlorothiazide (PRINZIDE,ZESTORETIC) 20-25 MG per tablet Take 1 tablet by mouth daily.   Yes Historical Provider, MD  NIFEdipine (PROCARDIA-XL/ADALAT-CC/NIFEDICAL-XL) 30 MG 24 hr tablet Take 30 mg by mouth daily.   Yes Historical Provider, MD  Assessment/Plan Incarcerated ventral/umbilical hernia with incarcerated transverse colon and omentum. S/p Ventral Hernia repair without mesh 10/07/13 Dr. Lindie Spruce Hypertension Body mass index is 42.55 kg/(m^2). Hx of thyroid cancer Dyslipidemia  Plan:  I will get her ready to go home.  She works at [employer] so I will ask her to take off the next couple weeks.  I have her an appointment for staple removal on 12/26 with the nurse during the Urgent office.  Follow up with Dr. Lindie Spruce on 10/30/13     LOS: 3 days    Sherryl Valido 10/09/2013

## 2013-10-09 NOTE — Discharge Summary (Signed)
Physician Discharge Summary  Patient ID: Bridget Lamb MRN: [id number] DOB/AGE: [birth year] 58 y.o.  Admit date: 10/06/2013 Discharge date: 10/09/2013  Admission Diagnoses:  Incarcerated ventral/umbilical hernia with incarcerated transverse colon and omentum.  Hypertension  Body mass index is 42.55 kg/(m^2).  Hx of thyroid cancer  Dyslipidemia   Discharge Diagnoses:  Same Principal Problem:   Incarcerated ventral hernia Active Problems:   Obesity, Class III, BMI 40-49.9 (morbid obesity)   Unspecified essential hypertension   Unspecified hypothyroidism   PROCEDURES: S/p Ventral Hernia repair without mesh 10/07/13 Dr. Rexene Edison Course: The patient comes in with a 1/2-2 day history of abdominal pain, nausea, vomiting. This falls a day where she has significant coughing secondary to recent cold that she had developed.  Clinically she had an incarcerated periumbilical ventral hernia. A 10 by the emergency room physician and by the surgeon were made to reduce the hernia and this was unsuccessful. She's not about her hernia for several years however it has never been symptomatic. She comes in with discomfort nausea vomiting and a CT scan demonstrates incarcerated periumbilical ventral hernia with a large amount of omentum, and part of her transverse colon which is obstructed in the hernia. There does not appear to be any evidence of perforation. She was admitted by Dr. Lindie Spruce and taken to the OR later that day.  Post op she has done well, she is up to a soft diet on the 2nd, postop day.  She has a small blistered site on the umbilicus that we will watch.  If she does well plan discharge home this evening after family transport and care available.  Follow up below.  She is to keep the sites clean and dry at home.  She has an abdominal binder in place.  Condition on d/c:  Improved.  Disposition: 04-Intermediate Care Facility       Future Appointments Provider Department Dept  Phone   10/19/2013 3:00 PM [facility] Surgery Nurse [facility] [place] [facility], [place] [phone]   10/30/2013 11:20 AM Cherylynn Ridges, MD [facility], [place] [phone]       Medication List         acetaminophen 325 MG tablet  Commonly known as:  TYLENOL  Take 2 tablets (650 mg total) by mouth every 6 (six) hours as needed for mild pain, moderate pain or fever (Do not take more than 4000 mg of tylenol (acetaminophen) in any 24 hour period.).     albuterol 108 (90 BASE) MCG/ACT inhaler  Commonly known as:  PROVENTIL HFA;VENTOLIN HFA  Inhale 1-2 puffs into the lungs every 6 (six) hours as needed for wheezing or shortness of breath.     aspirin 81 MG tablet  Take 81 mg by mouth daily.     atorvastatin 20 MG tablet  Commonly known as:  LIPITOR  Take 20 mg by mouth daily.     calcium citrate-vitamin D 315-200 MG-UNIT per tablet  Commonly known as:  CITRACAL+D  Take 1 tablet by mouth 2 (two) times daily.     CORICIDIN D COLD/FLU/SINUS 2-5-325 MG Tabs  Generic drug:  Chlorphen-Phenyleph-APAP  Take 2 tablets by mouth daily as needed (for cold/flu symptoms).     INTEGRA PLUS Caps  Take 1 capsule by mouth daily.     levothyroxine 200 MCG tablet  Commonly known as:  SYNTHROID, LEVOTHROID  Take 200 mcg by mouth daily before breakfast.     lisinopril-hydrochlorothiazide 20-25 MG per tablet  Commonly known as:  PRINZIDE,ZESTORETIC  Take 1 tablet by mouth daily.     MUCINEX FOR KIDS 100 MG/5ML liquid  Generic drug:  guaiFENesin  Take 200 mg by mouth 3 (three) times daily as needed for cough.     NIFEdipine 30 MG 24 hr tablet  Commonly known as:  PROCARDIA-XL/ADALAT-CC/NIFEDICAL-XL  Take 30 mg by mouth daily.     oxyCODONE-acetaminophen 5-325 MG per tablet  Commonly known as:  PERCOCET/ROXICET  Take 1-2 tablets by mouth every 4 (four) hours as needed for moderate pain.       Follow-up Information   Follow up with CCS,MD, MD On 10/19/2013. (You have a 3PM appointment with  the nurse to have staples removed.  Be there 20 minutes early for check in.)    Specialty:  General Surgery   Contact information:   [address] [place] [address] [place] [address] [phone]       Follow up with Cherylynn Ridges, MD On 10/30/2013. (Your appointment is at 11:20, be there at least 20 minutes early for check in.)    Specialty:  General Surgery   Contact information:   [address], [address]  [place] [address] [phone]       Follow up with Emeterio Reeve, MD. (Call with any medical issues.)    Specialty:  Family Medicine   Contact information:   [address] [place] [address] [phone]       Please follow up. (Clean site with soap and water)       Signed: Sherrie George 10/09/2013, 12:28 PM

## 2013-10-19 ENCOUNTER — Encounter (INDEPENDENT_AMBULATORY_CARE_PROVIDER_SITE_OTHER): Payer: BC Managed Care – PPO

## 2013-10-19 ENCOUNTER — Ambulatory Visit (INDEPENDENT_AMBULATORY_CARE_PROVIDER_SITE_OTHER): Payer: BC Managed Care – PPO

## 2013-10-19 DIAGNOSIS — Z4802 Encounter for removal of sutures: Secondary | ICD-10-CM

## 2013-10-19 NOTE — Progress Notes (Signed)
Pt came in today s/p ventral hernia repair to have her staples removed.  Pt states she is doing good, ready to go back to work.  She denies any fever, only having minimal pain.  Incision site looks great, just minimal redness due to staple irritation.  I cleansed the skin with chloraprep and then removed all 11 staples, applied benzoin and applied 1/2 inch steristrips.  I instructed pt that these would fall off on their own in 7-10 days and she may shower.  She is aware of her postop appt with Dr. Lindie Spruce on 11/08/13 @ 2:00pm.

## 2013-10-30 ENCOUNTER — Encounter (INDEPENDENT_AMBULATORY_CARE_PROVIDER_SITE_OTHER): Payer: BC Managed Care – PPO | Admitting: General Surgery

## 2013-10-31 ENCOUNTER — Encounter (INDEPENDENT_AMBULATORY_CARE_PROVIDER_SITE_OTHER): Payer: Self-pay | Admitting: General Surgery

## 2013-10-31 ENCOUNTER — Ambulatory Visit (INDEPENDENT_AMBULATORY_CARE_PROVIDER_SITE_OTHER): Payer: BC Managed Care – PPO | Admitting: General Surgery

## 2013-10-31 VITALS — BP 134/86 | HR 68 | Temp 97.0°F | Resp 16 | Wt 241.8 lb

## 2013-10-31 DIAGNOSIS — T8140XA Infection following a procedure, unspecified, initial encounter: Secondary | ICD-10-CM

## 2013-10-31 DIAGNOSIS — T814XXA Infection following a procedure, initial encounter: Principal | ICD-10-CM

## 2013-10-31 DIAGNOSIS — IMO0001 Reserved for inherently not codable concepts without codable children: Secondary | ICD-10-CM

## 2013-10-31 MED ORDER — CEPHALEXIN 500 MG PO CAPS: 500.0000 mg | ORAL_CAPSULE | Freq: Four times a day (QID) | ORAL | Status: AC

## 2013-10-31 NOTE — Patient Instructions (Signed)
Wash wound in shower or tub daily and apply Neosporin to small open area with a gauze bandage

## 2013-10-31 NOTE — Progress Notes (Signed)
History: Patient returns to the office approximately 3 weeks following urgent repair of an umbilical hernia at a previous trocar site. She noted a little skin breakdown over her umbilical skin several days ago. A couple of days ago she had some drainage of straw-colored or orange fluid which occurred again yesterday and she called and was asked to come to the urgent office. No increase in pain or fever or chills.  Exam: BP 134/86  Pulse 68  Temp(Src) 97 F (36.1 C) (Temporal)  Resp 16  Wt 241 lb 12.8 oz (109.68 kg)  LMP 10/22/2011 General: Obese in no distress Abdomen: Healing infraumbilical wound. There is a approximately 1 cm x 5 mm superficial eschar over the umbilical skin. There is some moderate erythema surrounding the umbilicus with a diameter of about 15 cm. There is a moderate seroma palpable beneath the umbilical skin.  After explaining the procedure I aspirated the seroma and obtained clear strawberry colored fluid which was sent for culture.  Assessment and plan: Wound cellulitis post hernia repair. Culture obtained. I'm starting her on Keflex 500 mg 4 times daily. She will return in 2 days for short term followup.

## 2013-11-02 ENCOUNTER — Encounter (INDEPENDENT_AMBULATORY_CARE_PROVIDER_SITE_OTHER): Payer: Self-pay | Admitting: General Surgery

## 2013-11-02 ENCOUNTER — Ambulatory Visit (INDEPENDENT_AMBULATORY_CARE_PROVIDER_SITE_OTHER): Payer: BC Managed Care – PPO | Admitting: General Surgery

## 2013-11-02 VITALS — BP 132/80 | HR 72 | Temp 98.0°F | Resp 18 | Ht 64.0 in | Wt 242.0 lb

## 2013-11-02 DIAGNOSIS — IMO0001 Reserved for inherently not codable concepts without codable children: Secondary | ICD-10-CM

## 2013-11-02 DIAGNOSIS — T814XXD Infection following a procedure, subsequent encounter: Principal | ICD-10-CM

## 2013-11-02 DIAGNOSIS — Z5189 Encounter for other specified aftercare: Secondary | ICD-10-CM

## 2013-11-02 MED ORDER — OXYCODONE-ACETAMINOPHEN 5-325 MG PO TABS: 1.0000 | ORAL_TABLET | ORAL | Status: DC | PRN

## 2013-11-02 NOTE — Progress Notes (Signed)
History: Patient returns for followup for her wound cellulitis post umbilical hernia repair. She's feeling okay.  Exam: The erythema is significantly decreased from 2 days ago. A minimal seroma has recurred and I aspirated about 3 cc of clear serous fluid. Cultures from 2 days ago are pending  Assessment and plan: Improved with saline this. Continue oral antibiotics. She is to see Dr. Hulen Skains in 6 days for routine followup.

## 2013-11-08 ENCOUNTER — Ambulatory Visit (INDEPENDENT_AMBULATORY_CARE_PROVIDER_SITE_OTHER): Payer: BC Managed Care – PPO | Admitting: General Surgery

## 2013-11-08 ENCOUNTER — Encounter (INDEPENDENT_AMBULATORY_CARE_PROVIDER_SITE_OTHER): Payer: Self-pay

## 2013-11-08 VITALS — BP 136/84 | HR 66 | Temp 97.9°F | Resp 16 | Ht 63.5 in | Wt 244.2 lb

## 2013-11-08 DIAGNOSIS — T8140XA Infection following a procedure, unspecified, initial encounter: Secondary | ICD-10-CM

## 2013-11-08 DIAGNOSIS — T8149XA Infection following a procedure, other surgical site, initial encounter: Secondary | ICD-10-CM | POA: Insufficient documentation

## 2013-11-08 NOTE — Progress Notes (Signed)
The patient comes in today doing very well although she has had seen one of my partners in clinic previously because of an infected wound seroma.  The culture from the wound aspiration grew pseudomonas aeruginosa. The patient has been on Keflex for the past week which apparently does treat this particular bacteria.  The patient feels well and the drainage from her periumbilical incision is still clear. Fortunately the patient has not had any mesh inside of her wound as the decision was made because of the emergency bases of the surgery and the likely bacterial translocation from the obstruction would've made this at high risk for an infection.  On examination today the umbilicus is purplish in color and blanches very easily but does not appear to be infected. She does not have a fever in the periumbilical area is only minimally tender. There is still some clear drainage from the lower portion of the umbilicus with the skin has eroded somewhat.  The patient is to continue to take her antibiotics and keep her wound dressed with triple antibiotic ointment and a Band-Aid. She is to keep it covered even after the drainage had ceased. She is to return to see me in 2 months at which time I will reassess her wound. Fortunately at this time there is no evidence of any recurrent hernia.

## 2013-11-30 ENCOUNTER — Other Ambulatory Visit: Payer: Self-pay

## 2013-11-30 DIAGNOSIS — Z1231 Encounter for screening mammogram for malignant neoplasm of breast: Secondary | ICD-10-CM

## 2013-12-17 ENCOUNTER — Encounter (INDEPENDENT_AMBULATORY_CARE_PROVIDER_SITE_OTHER): Payer: Self-pay | Admitting: General Surgery

## 2013-12-24 ENCOUNTER — Ambulatory Visit: Payer: BC Managed Care – PPO

## 2013-12-24 DIAGNOSIS — I5032 Chronic diastolic (congestive) heart failure: Secondary | ICD-10-CM

## 2013-12-24 DIAGNOSIS — I509 Heart failure, unspecified: Secondary | ICD-10-CM

## 2014-01-07 ENCOUNTER — Ambulatory Visit
Admission: RE | Admit: 2014-01-07 | Discharge: 2014-01-07 | Disposition: A | Discharge status: Home / Self Care | Payer: BC Managed Care – PPO | Source: Ambulatory Visit

## 2014-01-07 DIAGNOSIS — Z1231 Encounter for screening mammogram for malignant neoplasm of breast: Secondary | ICD-10-CM

## 2014-01-08 ENCOUNTER — Encounter (INDEPENDENT_AMBULATORY_CARE_PROVIDER_SITE_OTHER): Payer: Self-pay | Admitting: General Surgery

## 2014-01-08 ENCOUNTER — Ambulatory Visit (INDEPENDENT_AMBULATORY_CARE_PROVIDER_SITE_OTHER): Payer: BC Managed Care – PPO | Admitting: General Surgery

## 2014-01-08 VITALS — BP 124/78 | HR 80 | Temp 97.8°F | Resp 14 | Ht 64.0 in | Wt 239.0 lb

## 2014-01-08 DIAGNOSIS — Z09 Encounter for follow-up examination after completed treatment for conditions other than malignant neoplasm: Secondary | ICD-10-CM | POA: Insufficient documentation

## 2014-01-08 NOTE — Progress Notes (Signed)
Subjective:     Patient ID: Bridget Lamb, female   DOB: Jun 04, 1956, 58 y.o.   MRN: [id number]  HPI The patient is doing very well. She is having no more abdominal discomfort. She has no drainage from her wound.  Review of Systems No fevers or chills.    Objective:   Physical Exam Completely healed abdominal wound with no evidence of recurrent hernia. She has no tenderness and no drainage.    Assessment:     Completely healed abdominal wound status post emergency ventral hernia repair without mesh     Plan:     She is released from my care. She can return to see me as needed. Activity is unrestricted.

## 2014-10-09 ENCOUNTER — Other Ambulatory Visit: Payer: Self-pay | Admitting: Internal Medicine

## 2014-12-12 ENCOUNTER — Other Ambulatory Visit: Payer: Self-pay

## 2014-12-12 DIAGNOSIS — Z1231 Encounter for screening mammogram for malignant neoplasm of breast: Secondary | ICD-10-CM

## 2015-01-14 ENCOUNTER — Encounter (INDEPENDENT_AMBULATORY_CARE_PROVIDER_SITE_OTHER): Payer: Self-pay

## 2015-01-14 ENCOUNTER — Ambulatory Visit
Admission: RE | Admit: 2015-01-14 | Discharge: 2015-01-14 | Disposition: A | Discharge status: Home / Self Care | Payer: BLUE CROSS/BLUE SHIELD | Source: Ambulatory Visit

## 2015-01-14 DIAGNOSIS — Z1231 Encounter for screening mammogram for malignant neoplasm of breast: Secondary | ICD-10-CM

## 2015-02-04 DIAGNOSIS — D509 Iron deficiency anemia, unspecified: Secondary | ICD-10-CM | POA: Diagnosis present

## 2015-12-10 ENCOUNTER — Other Ambulatory Visit: Payer: Self-pay

## 2015-12-10 DIAGNOSIS — Z1231 Encounter for screening mammogram for malignant neoplasm of breast: Secondary | ICD-10-CM

## 2016-01-21 ENCOUNTER — Ambulatory Visit
Admission: RE | Admit: 2016-01-21 | Discharge: 2016-01-21 | Disposition: A | Discharge status: Home / Self Care | Payer: BLUE CROSS/BLUE SHIELD | Source: Ambulatory Visit

## 2016-01-21 DIAGNOSIS — Z1231 Encounter for screening mammogram for malignant neoplasm of breast: Secondary | ICD-10-CM

## 2016-12-20 ENCOUNTER — Other Ambulatory Visit: Payer: Self-pay | Admitting: Internal Medicine

## 2016-12-20 DIAGNOSIS — Z1231 Encounter for screening mammogram for malignant neoplasm of breast: Secondary | ICD-10-CM

## 2017-01-31 ENCOUNTER — Ambulatory Visit
Admission: RE | Admit: 2017-01-31 | Discharge: 2017-01-31 | Disposition: A | Discharge status: Home / Self Care | Payer: BLUE CROSS/BLUE SHIELD | Source: Ambulatory Visit | Attending: Internal Medicine | Admitting: Internal Medicine

## 2017-01-31 DIAGNOSIS — Z1231 Encounter for screening mammogram for malignant neoplasm of breast: Secondary | ICD-10-CM

## 2018-01-12 ENCOUNTER — Other Ambulatory Visit: Payer: Self-pay | Admitting: Internal Medicine

## 2018-01-12 DIAGNOSIS — Z1231 Encounter for screening mammogram for malignant neoplasm of breast: Secondary | ICD-10-CM

## 2018-02-08 ENCOUNTER — Ambulatory Visit
Admission: RE | Admit: 2018-02-08 | Discharge: 2018-02-08 | Disposition: A | Discharge status: Home / Self Care | Payer: BLUE CROSS/BLUE SHIELD | Source: Ambulatory Visit | Attending: Internal Medicine | Admitting: Internal Medicine

## 2018-02-08 DIAGNOSIS — Z1231 Encounter for screening mammogram for malignant neoplasm of breast: Secondary | ICD-10-CM

## 2018-08-29 ENCOUNTER — Emergency Department (HOSPITAL_COMMUNITY): Payer: BLUE CROSS/BLUE SHIELD

## 2018-08-29 ENCOUNTER — Observation Stay (HOSPITAL_BASED_OUTPATIENT_CLINIC_OR_DEPARTMENT_OTHER): Payer: BLUE CROSS/BLUE SHIELD

## 2018-08-29 ENCOUNTER — Other Ambulatory Visit: Payer: Self-pay

## 2018-08-29 ENCOUNTER — Encounter (HOSPITAL_COMMUNITY): Payer: Self-pay | Admitting: Emergency Medicine

## 2018-08-29 ENCOUNTER — Inpatient Hospital Stay (HOSPITAL_COMMUNITY)
Admission: EM | Admit: 2018-08-29 | Discharge: 2018-09-01 | DRG: 291 | Disposition: A | Discharge status: Home / Self Care | Payer: BLUE CROSS/BLUE SHIELD | Attending: Internal Medicine | Admitting: Internal Medicine

## 2018-08-29 DIAGNOSIS — I340 Nonrheumatic mitral (valve) insufficiency: Secondary | ICD-10-CM | POA: Diagnosis not present

## 2018-08-29 DIAGNOSIS — J9601 Acute respiratory failure with hypoxia: Secondary | ICD-10-CM | POA: Diagnosis not present

## 2018-08-29 DIAGNOSIS — Z7982 Long term (current) use of aspirin: Secondary | ICD-10-CM

## 2018-08-29 DIAGNOSIS — R0902 Hypoxemia: Secondary | ICD-10-CM

## 2018-08-29 DIAGNOSIS — I509 Heart failure, unspecified: Secondary | ICD-10-CM

## 2018-08-29 DIAGNOSIS — E1122 Type 2 diabetes mellitus with diabetic chronic kidney disease: Secondary | ICD-10-CM | POA: Diagnosis present

## 2018-08-29 DIAGNOSIS — N183 Chronic kidney disease, stage 3 (moderate): Secondary | ICD-10-CM | POA: Diagnosis present

## 2018-08-29 DIAGNOSIS — I130 Hypertensive heart and chronic kidney disease with heart failure and stage 1 through stage 4 chronic kidney disease, or unspecified chronic kidney disease: Principal | ICD-10-CM | POA: Diagnosis present

## 2018-08-29 DIAGNOSIS — J45901 Unspecified asthma with (acute) exacerbation: Secondary | ICD-10-CM | POA: Insufficient documentation

## 2018-08-29 DIAGNOSIS — I10 Essential (primary) hypertension: Secondary | ICD-10-CM | POA: Diagnosis present

## 2018-08-29 DIAGNOSIS — D509 Iron deficiency anemia, unspecified: Secondary | ICD-10-CM | POA: Diagnosis present

## 2018-08-29 DIAGNOSIS — E785 Hyperlipidemia, unspecified: Secondary | ICD-10-CM | POA: Diagnosis present

## 2018-08-29 DIAGNOSIS — Z6841 Body Mass Index (BMI) 40.0 and over, adult: Secondary | ICD-10-CM

## 2018-08-29 DIAGNOSIS — E890 Postprocedural hypothyroidism: Secondary | ICD-10-CM | POA: Diagnosis present

## 2018-08-29 DIAGNOSIS — I5032 Chronic diastolic (congestive) heart failure: Secondary | ICD-10-CM

## 2018-08-29 DIAGNOSIS — Z79899 Other long term (current) drug therapy: Secondary | ICD-10-CM

## 2018-08-29 DIAGNOSIS — I5033 Acute on chronic diastolic (congestive) heart failure: Secondary | ICD-10-CM | POA: Diagnosis present

## 2018-08-29 DIAGNOSIS — E039 Hypothyroidism, unspecified: Secondary | ICD-10-CM | POA: Diagnosis present

## 2018-08-29 DIAGNOSIS — Z7951 Long term (current) use of inhaled steroids: Secondary | ICD-10-CM

## 2018-08-29 DIAGNOSIS — Z85850 Personal history of malignant neoplasm of thyroid: Secondary | ICD-10-CM

## 2018-08-29 DIAGNOSIS — J441 Chronic obstructive pulmonary disease with (acute) exacerbation: Secondary | ICD-10-CM | POA: Diagnosis present

## 2018-08-29 HISTORY — DX: Dyspnea, unspecified: R06.00

## 2018-08-29 HISTORY — DX: Adverse effect of unspecified anesthetic, initial encounter: T41.45XA

## 2018-08-29 HISTORY — DX: Other complications of anesthesia, initial encounter: T88.59XA

## 2018-08-29 HISTORY — DX: Other specified postprocedural states: Z98.890

## 2018-08-29 HISTORY — DX: Other specified postprocedural states: R11.2

## 2018-08-29 LAB — CBC
HCT: 37.7 % (ref 36.0–46.0)
Hemoglobin: 11.3 g/dL — ABNORMAL LOW (ref 12.0–15.0)
MCH: 29.2 pg (ref 26.0–34.0)
MCHC: 30 g/dL (ref 30.0–36.0)
MCV: 97.4 fL (ref 80.0–100.0)
Platelets: 170 10*3/uL (ref 150–400)
RBC: 3.87 MIL/uL (ref 3.87–5.11)
RDW: 13.3 % (ref 11.5–15.5)
WBC: 7 10*3/uL (ref 4.0–10.5)
nRBC: 0.3 % — ABNORMAL HIGH (ref 0.0–0.2)

## 2018-08-29 LAB — I-STAT ARTERIAL BLOOD GAS, ED
Acid-Base Excess: 6 mmol/L — ABNORMAL HIGH (ref 0.0–2.0)
Bicarbonate: 32.8 mmol/L — ABNORMAL HIGH (ref 20.0–28.0)
O2 Saturation: 94 %
TCO2: 34 mmol/L — ABNORMAL HIGH (ref 22–32)
pCO2 arterial: 58 mmHg — ABNORMAL HIGH (ref 32.0–48.0)
pH, Arterial: 7.36 (ref 7.350–7.450)
pO2, Arterial: 78 mmHg — ABNORMAL LOW (ref 83.0–108.0)

## 2018-08-29 LAB — GLUCOSE, CAPILLARY
Glucose-Capillary: 147 mg/dL — ABNORMAL HIGH (ref 70–99)
Glucose-Capillary: 128 mg/dL — ABNORMAL HIGH (ref 70–99)

## 2018-08-29 LAB — BASIC METABOLIC PANEL
Anion gap: 9 (ref 5–15)
BUN: 11 mg/dL (ref 8–23)
CO2: 29 mmol/L (ref 22–32)
Calcium: 9 mg/dL (ref 8.9–10.3)
Chloride: 97 mmol/L — ABNORMAL LOW (ref 98–111)
Creatinine, Ser: 0.88 mg/dL (ref 0.44–1.00)
GFR calc Af Amer: >60 mL/min (ref >60)
GFR calc non Af Amer: >60 mL/min (ref >60)
Glucose, Bld: 125 mg/dL — ABNORMAL HIGH (ref 70–99)
Potassium: 3.7 mmol/L (ref 3.5–5.1)
Sodium: 135 mmol/L (ref 135–145)

## 2018-08-29 LAB — BRAIN NATRIURETIC PEPTIDE: B Natriuretic Peptide: 164.1 pg/mL — ABNORMAL HIGH (ref 0.0–100.0)

## 2018-08-29 LAB — TROPONIN I: Troponin I: <0.03 ng/mL (ref <0.03)

## 2018-08-29 MED ORDER — CHLORPHEN-PE-ACETAMINOPHEN 2-5-325 MG PO TABS: 2.0000 | ORAL_TABLET | Freq: Every day | ORAL | Status: DC | PRN

## 2018-08-29 MED ORDER — INSULIN ASPART 100 UNIT/ML ~~LOC~~ SOLN: 0.0000 [IU] | Freq: Every day | SUBCUTANEOUS | Status: DC

## 2018-08-29 MED ORDER — INSULIN ASPART 100 UNIT/ML ~~LOC~~ SOLN
0.0000 [IU] | Freq: Three times a day (TID) | SUBCUTANEOUS | Status: DC
  Filled 2018-08-29: qty 1

## 2018-08-29 MED ORDER — HYDROCHLOROTHIAZIDE 25 MG PO TABS
25.0000 mg | ORAL_TABLET | Freq: Every day | ORAL | Status: DC
  Administered 2018-08-29 – 2018-08-31 (×3): 25 mg via ORAL
  Filled 2018-08-29 (×3): qty 1

## 2018-08-29 MED ORDER — ALBUTEROL SULFATE (2.5 MG/3ML) 0.083% IN NEBU
5.0000 mg | INHALATION_SOLUTION | Freq: Once | RESPIRATORY_TRACT | Status: AC
  Administered 2018-08-29: 5 mg via RESPIRATORY_TRACT
  Filled 2018-08-29: qty 6

## 2018-08-29 MED ORDER — IPRATROPIUM-ALBUTEROL 0.5-2.5 (3) MG/3ML IN SOLN
3.0000 mL | RESPIRATORY_TRACT | Status: DC
  Administered 2018-08-29 – 2018-08-30 (×5): 3 mL via RESPIRATORY_TRACT
  Filled 2018-08-29 (×5): qty 3

## 2018-08-29 MED ORDER — IPRATROPIUM BROMIDE 0.02 % IN SOLN
0.5000 mg | Freq: Once | RESPIRATORY_TRACT | Status: AC
  Administered 2018-08-29: 0.5 mg via RESPIRATORY_TRACT
  Filled 2018-08-29: qty 2.5

## 2018-08-29 MED ORDER — FUROSEMIDE 10 MG/ML IJ SOLN
20.0000 mg | Freq: Once | INTRAMUSCULAR | Status: AC
  Administered 2018-08-29: 20 mg via INTRAVENOUS
  Filled 2018-08-29: qty 2

## 2018-08-29 MED ORDER — ATORVASTATIN CALCIUM 20 MG PO TABS
20.0000 mg | ORAL_TABLET | Freq: Every day | ORAL | Status: DC
  Administered 2018-08-29 – 2018-08-31 (×3): 20 mg via ORAL
  Filled 2018-08-29 (×3): qty 1

## 2018-08-29 MED ORDER — LEVOTHYROXINE SODIUM 100 MCG PO TABS
200.0000 ug | ORAL_TABLET | Freq: Every day | ORAL | Status: DC
  Administered 2018-08-30 – 2018-09-01 (×3): 200 ug via ORAL
  Filled 2018-08-29 (×2): qty 2
  Filled 2018-08-29: qty 1
  Filled 2018-08-29: qty 2

## 2018-08-29 MED ORDER — NIFEDIPINE ER OSMOTIC RELEASE 30 MG PO TB24
30.0000 mg | ORAL_TABLET | Freq: Every day | ORAL | Status: DC
  Administered 2018-08-29 – 2018-09-01 (×4): 30 mg via ORAL
  Filled 2018-08-29 (×4): qty 1

## 2018-08-29 MED ORDER — LISINOPRIL 20 MG PO TABS
20.0000 mg | ORAL_TABLET | Freq: Every day | ORAL | Status: DC
  Administered 2018-08-29 – 2018-09-01 (×4): 20 mg via ORAL
  Filled 2018-08-29 (×4): qty 1

## 2018-08-29 MED ORDER — INTEGRA PLUS PO CAPS: 1.0000 | ORAL_CAPSULE | Freq: Every day | ORAL | Status: DC

## 2018-08-29 MED ORDER — CALCIUM CITRATE-VITAMIN D 315-200 MG-UNIT PO TABS: 1.0000 | ORAL_TABLET | Freq: Two times a day (BID) | ORAL | Status: DC

## 2018-08-29 MED ORDER — AZITHROMYCIN 250 MG PO TABS
250.0000 mg | ORAL_TABLET | Freq: Every day | ORAL | Status: AC
  Administered 2018-08-30 – 2018-09-01 (×3): 250 mg via ORAL
  Filled 2018-08-29 (×3): qty 1

## 2018-08-29 MED ORDER — PREDNISONE 20 MG PO TABS
60.0000 mg | ORAL_TABLET | Freq: Every day | ORAL | Status: DC
  Administered 2018-08-30: 60 mg via ORAL
  Filled 2018-08-29: qty 3

## 2018-08-29 MED ORDER — LISINOPRIL-HYDROCHLOROTHIAZIDE 20-25 MG PO TABS: 1.0000 | ORAL_TABLET | Freq: Every day | ORAL | Status: DC

## 2018-08-29 MED ORDER — ENOXAPARIN SODIUM 60 MG/0.6ML ~~LOC~~ SOLN
60.0000 mg | SUBCUTANEOUS | Status: DC
  Administered 2018-08-30 – 2018-08-31 (×3): 60 mg via SUBCUTANEOUS
  Filled 2018-08-29 (×3): qty 0.6

## 2018-08-29 MED ORDER — ALBUTEROL SULFATE (2.5 MG/3ML) 0.083% IN NEBU: 3.0000 mL | INHALATION_SOLUTION | Freq: Four times a day (QID) | RESPIRATORY_TRACT | Status: DC | PRN

## 2018-08-29 MED ORDER — ASPIRIN 81 MG PO CHEW
81.0000 mg | CHEWABLE_TABLET | Freq: Every day | ORAL | Status: DC
  Administered 2018-08-29 – 2018-09-01 (×4): 81 mg via ORAL
  Filled 2018-08-29 (×4): qty 1

## 2018-08-29 NOTE — ED Triage Notes (Signed)
Per [facility], pt coming from her MD's office where she was found to be on 76% room air. Pt has been SOB and has had a cough for a week now and was diagnosed with bronchitis per her PCP. Pt was given a Z pack, prednisone, lasix and 2.5 mg albuterol from her PCP. Pt was received 15mg  alburterol, 1mg  atrovent, and 125mg  solu medrol via EMS. Pt has hx of CHF with swollen bi lateral legs. A&O x4. VSS.

## 2018-08-29 NOTE — H&P (Addendum)
History and Physical    Bridget Lamb AVW:[id number] DOB: 1956/03/07 DOA: 08/29/2018  PCP: [name], PA-C Consultants:  none Patient coming from: PCP's office  Chief Complaint: SOB  HPI: Bridget Lamb is a 62 y.o. female with medical history significant for asthma, obesity, essential HTN, dyslipidemia, diastolic CHF who presented to the ED today with c/o SOB x 1 week. Went to PCP yesterday, was given steroids, breathing tx, Z pack and lasix. She took the first 2 days of the Z pack and steroids but did not take the lasix. Returned for followup today and was noted to be hypoxic to the low 80s (apparently her O2 sats were fluctuating significantly). She reports having a cough productive of yellow sputum for the last week, no fever/chills. She has a h/o bronchitis and PNA, has never been admitted for asthma exacerbation. She reports she has albuterol nebs at home but only uses it about 10 times per year. She reports some chest pressure/discomfort but describes it mostly as a feeling that she can't take a deep breath. She usually uses 2 pillows to sleep, has not had what sounds like true orthopnea, no PND. She has chronic BLE edema that she thinks has worsened a bit over the past few weeks. She does not take diuretics. 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  ED Course: Given solumedrol 125 mg IV, albuterol/atrovent neb via EMS 88% on RA, 98% on O2, CXR mild pulm edema Wheezing on exam BLE edema present Sats drop when asleep, mouth breathing  Review of Systems: As per HPI; otherwise review of systems reviewed and negative.   Ambulatory Status:  Ambulates without assistance  Past Medical History:  Diagnosis Date  . Allergic rhinitis   . Anemia    hematology, Dr. Julien Nordmann, last 01/2011  . CHF (congestive heart failure) ([place]) 2004  . Dyslipidemia   . H/O echocardiogram 01/07/04   mild LVH, nl LV systolic function, EF 82%, left atrial  enlargement, mildly thickened aortic and mitral valves  . History of thyroid cancer   . Hypertension   . Obesity   . Obesity, Class III, BMI 40-49.9 (morbid obesity) ([place]) 10/09/2013  . Osteopenia    BMD 02/27/08   . Osteopenia   . Umbilical hernia   . Unspecified essential hypertension 10/09/2013  . Unspecified hypothyroidism 10/09/2013    Past Surgical History:  Procedure Laterality Date  . BONE MARROW BIOPSY    . CARDIAC CATHETERIZATION  2005  . CHOLECYSTECTOMY  2003  . COLONOSCOPY  2008  . HERNIA REPAIR  2014   Ventral Hernia Repair  . THYROID SURGERY     partial thyroidectomy  . TOTAL THYROIDECTOMY  2009  . VENTRAL HERNIA REPAIR N/A 10/07/2013   Procedure: HERNIA REPAIR VENTRAL ADULT;  Surgeon: Gwenyth Ober, MD;  Location: [facility];  Service: General;  Laterality: N/A;    Social History   Socioeconomic History  . Marital status: Divorced    Spouse name: Not on file  . Number of children: Not on file  . Years of education: Not on file  . Highest education level: Not on file  Occupational History  . Not on file  Social Needs  . Financial resource strain: Not on file  . Food insecurity:    Worry: Not on file    Inability: Not on file  . Transportation needs:    Medical: Not on file    Non-medical: Not on file  Tobacco  Use  . Smoking status: Never Smoker  Substance and Sexual Activity  . Alcohol use: No  . Drug use: No  . Sexual activity: Not on file  Lifestyle  . Physical activity:    Days per week: Not on file    Minutes per session: Not on file  . Stress: Not on file  Relationships  . Social connections:    Talks on phone: Not on file    Gets together: Not on file    Attends religious service: Not on file    Active member of club or organization: Not on file    Attends meetings of clubs or organizations: Not on file    Relationship status: Not on file  . Intimate partner violence:    Fear of current or ex partner: Not on file    Emotionally abused:  Not on file    Physically abused: Not on file    Forced sexual activity: Not on file  Other Topics Concern  . Not on file  Social History Narrative  . Not on file    No Known Allergies  Family History  Problem Relation Age of Onset  . Cancer Mother   . Hypertension Mother   . Breast cancer Maternal Aunt     Prior to Admission medications   Medication Sig Start Date End Date Taking? Authorizing Provider  albuterol (PROVENTIL HFA;VENTOLIN HFA) 108 (90 BASE) MCG/ACT inhaler Inhale 1-2 puffs into the lungs every 6 (six) hours as needed for wheezing or shortness of breath.    [provider]  aspirin 81 MG tablet Take 81 mg by mouth daily.    [provider]  atorvastatin (LIPITOR) 20 MG tablet Take 20 mg by mouth daily.    [provider]  calcium citrate-vitamin D (CITRACAL+D) 315-200 MG-UNIT per tablet Take 1 tablet by mouth 2 (two) times daily.    [provider]  Chlorphen-Phenyleph-APAP (CORICIDIN D COLD/FLU/SINUS) 2-5-325 MG TABS Take 2 tablets by mouth daily as needed (for cold/flu symptoms).    [provider]  FeFum-FePoly-FA-B Cmp-C-Biot (INTEGRA PLUS) CAPS Take 1 capsule by mouth daily.    [provider]  levothyroxine (SYNTHROID, LEVOTHROID) 200 MCG tablet Take 200 mcg by mouth daily before breakfast.    [provider]  lisinopril-hydrochlorothiazide (PRINZIDE,ZESTORETIC) 20-25 MG per tablet Take 1 tablet by mouth daily.    [provider]  NIFEdipine (PROCARDIA-XL/ADALAT-CC/NIFEDICAL-XL) 30 MG 24 hr tablet Take 30 mg by mouth daily.    [provider]    Physical Exam: Vitals:   08/29/18 1008 08/29/18 1020 08/29/18 1030 08/29/18 1100  BP:   135/64 (!) 145/72  Pulse:   90 91  Resp:   17 15  SpO2:  (!) 88% 96% 99%  Weight: 123.4 kg     Height: '5\' 4"'$  (1.626 m)     BP (!) 163/71 (BP Location: Right Arm)   Pulse 87   Temp 100.3 F (37.9 C) (Oral)   Resp 18   Ht '5\' 4"'$  (1.626 m)   Wt  123.4 kg   LMP 10/22/2011   SpO2 93%   BMI 46.69 kg/m     . General: Obese female, asleep when I entered the room, sats were 96% at that time with facemask O2. Arouses fairly easily to loud voice. Upon awakening appears calm and comfortable and is in NAD, alert and oriented x 3 . Eyes:  PERRL, EOMI, normal lids, iris . ENT:  grossly normal hearing, lips & tongue, mmm;  appropriate dentition . Neck:  no LAD, masses or thyromegaly; no carotid bruits . Cardiovascular:  normal rate, reg rhythm, no murmur. Marland Kitchen Respiratory:  Diffuse low pitched expiratory wheezes in all fields, slightly worse on the L; no rales/rhonchi. Slightly increased WOB, no accessory muscle use . Abdomen: obese, soft, NT, ND, NABS . Back:   grossly normal alignment, no CVAT . Skin:  no rash or induration seen on limited exam . Musculoskeletal:  grossly normal tone BUE/BLE, good ROM, no bony abnormality or obvious joint deformity . Lower extremity:  She has 2+ BLE edema.  Limited foot exam with no ulcerations.  2+ distal pulses. Marland Kitchen Psychiatric:  grossly normal mood and affect, speech fluent and appropriate, AOx3 . Neurologic:  CN 2-12 grossly intact, moves all extremities in coordinated fashion, sensation intact    Radiological Exams on Admission: Dg Chest 2 View  Result Date: 08/29/2018 CLINICAL DATA:  Shortness of breath.  Respiratory illness. EXAM: CHEST - 2 VIEW COMPARISON:  11/16/2012 FINDINGS: Cardiac silhouette is prominent for size but probably accentuated by the lordotic view. Again noted are enlarged central vascular structures and cannot exclude vascular congestion or mild edema. There is some peribronchial cuffing or thickening. Patchy linear densities in the periphery of the mid left lung. Stable mild elevation of the right hemidiaphragm. Surgical clips in the lower neck region. No large pleural effusions. IMPRESSION: 1. Enlarged vascular structures with peribronchial thickening. Findings are suggestive for  vascular congestion and mild pulmonary edema. 2. Streaky densities in the mid left lung are nonspecific but could be related to atelectasis. Consider short-term follow-up to see if this area resolves. 3. Mild cardiomegaly. Electronically Signed   By: Markus Daft M.D.   On: 08/29/2018 11:55    EKG: Independently reviewed.  NSR with rate 93 Probable left atrial enlargement Right axis deviation Low voltage, precordial leads Borderline T abnormalities, anterior leads Prolonged QT interval No significant change since last tracing   Labs on Admission: I have personally reviewed the available labs and imaging studies at the time of the admission.  Pertinent labs:  Na 135 K 3.7 Cl 97 CO2 29 Gluc 125 BUN 11 Creat 0.88 (baseline) Ca 9 BNP 164.1 TnI < 0.03 WBC 7 Hgb 11.3 Plt 170    Assessment/Plan Principal Problem:   Acute respiratory failure with hypoxia (HCC) Active Problems:   Essential hypertension, benign   Dyslipidemia   Hypothyroidism   Obesity, Class III, BMI 40-49.9 (morbid obesity) (HCC)   CHF (congestive heart failure) (HCC)   Iron deficiency anemia   Asthma, chronic, unspecified asthma severity, with acute exacerbation   Acute respiratory failure with hypoxia: I feel this is likely multifactorial. Certainly with her h/o asthma, and wheezing on exam, never smoker, compatible with asthma exacerbation primarily; however I am also concerned that pulm edema/ CHF may be contributing as well as possibly OSA/OHS. Bronchitis may also be playing a role given her productive cough and low grade fever. -admit to telemetry observation -check ABG -check peak flow and monitor progress -prednisone 60 mg daily x 5 days -albuterol and atrovent nebs q4h scheduled -RT eval and treat -O2 to keep sats >95% -will continue her course of Z pack (day 2/5) -if worsens consider transfer to higher level of care, mag sulfate 2 g -check TTE -will give trial of lasix given her known CHF,  significant LE edema, elevated BNP (can be falsely low in obese patients) and CXR findings -> Start with 20 mg IV x1 and monitor I/O, BMP  Essential HTN -cont lisinopril/HCTZ, procardia-XL  Hypothyroidism -cont home levothyroxine 200 mg   IDA -cont home iron supplement  Dyslipidemia -cont lipitor, baby ASA   DVT prophylaxis:  Lovenox  Code Status:  Full - confirmed with patient/family Family Communication: none  Disposition Plan:  Home once clinically improved Consults called: none  Admission status: It is my clinical opinion that referral for OBSERVATION is reasonable and necessary in this patient based on the above information provided. The aforementioned taken together are felt to place the patient at high risk for further clinical deterioration. However it is anticipated that the patient may be medically stable for discharge from the hospital within 24 to 48 hours.     Janora Norlander MD [facility]  If note is complete, please contact covering daytime or nighttime physician. [email or web address] Password [email or web address]  08/29/2018, 1:01 PM

## 2018-08-29 NOTE — Progress Notes (Signed)
Pt oxygen saturation began desating while on room air to 65%. Pt skin appears pale. Pt states feel fatigued. Noticeable change from 1945. Pt was up talking in complete sentences and ambulating in room and to bathroom. Rapid response paged and also RRT. Pt placed on venturi mask  28% Fio2. Pulse ox. Increased to 88%.  Recent vitals BP 169/79 HR NSR 88bpm

## 2018-08-29 NOTE — Progress Notes (Signed)
  Echocardiogram 2D Echocardiogram has been performed.  Volney Reierson T Jerriyah Louis 08/29/2018, 4:08 PM

## 2018-08-29 NOTE — ED Notes (Signed)
Pt ambulated to RR with steady gait, able to tolerate.

## 2018-08-29 NOTE — Progress Notes (Signed)
Pt symptoms resolved. PT awaken, HOB elevated to 90, stood up. Pt began looking better. Venturi mask removed. Pt pulse ox remained 92% on room air. Pt placed on nasal cannula while sleeping for the night to avoid episode.

## 2018-08-29 NOTE — ED Provider Notes (Signed)
[facility] Provider Note   CSN: [id number] Arrival date & time: 08/29/18  1001     History   Chief Complaint Chief Complaint  Patient presents with  . Shortness of Breath    HPI Bridget Lamb is a 62 y.o. female.  HPI Pt started feeling short of breath about a week ago.  Her symptoms have been slowly getting worse.  She went to see her doctor yesterday and was started on medications including steroids.  Pt has a pressure on her chest.  She does not feel like she can take a deep breath.  She has some discomfort with coughing but does not think she can bring it up.  Pt was noted to have low o2 sat at the office and was sent to the ED.  She does have history of asthma and chf.  Pt was given a breathing treatment and steroids by ems Past Medical History:  Diagnosis Date  . Allergic rhinitis   . Anemia    hematology, Dr. Julien Nordmann, last 01/2011  . CHF (congestive heart failure) ([place]) 2004  . Dyslipidemia   . H/O echocardiogram 01/07/04   mild LVH, nl LV systolic function, EF 49%, left atrial enlargement, mildly thickened aortic and mitral valves  . History of thyroid cancer   . Hypertension   . Obesity   . Obesity, Class III, BMI 40-49.9 (morbid obesity) ([place]) 10/09/2013  . Osteopenia    BMD 02/27/08   . Osteopenia   . Umbilical hernia   . Unspecified essential hypertension 10/09/2013  . Unspecified hypothyroidism 10/09/2013    Patient Active Problem List   Diagnosis Date Noted  . Postop check 01/08/2014  . Postoperative wound infection 11/08/2013  . Obesity, Class III, BMI 40-49.9 (morbid obesity) ([place]) 10/09/2013  . Unspecified essential hypertension 10/09/2013  . Unspecified hypothyroidism 10/09/2013  . Incarcerated ventral hernia 10/07/2013  . Essential hypertension, benign 01/31/2012  . Dyslipidemia 01/31/2012  . Hypothyroidism 01/31/2012  . Anemia 01/31/2012    Past Surgical History:  Procedure Laterality Date  . BONE  MARROW BIOPSY    . CARDIAC CATHETERIZATION  2005  . CHOLECYSTECTOMY  2003  . COLONOSCOPY  2008  . HERNIA REPAIR  2014   Ventral Hernia Repair  . THYROID SURGERY     partial thyroidectomy  . TOTAL THYROIDECTOMY  2009  . VENTRAL HERNIA REPAIR N/A 10/07/2013   Procedure: HERNIA REPAIR VENTRAL ADULT;  Surgeon: Gwenyth Ober, MD;  Location: [facility];  Service: General;  Laterality: N/A;     OB History   None      Home Medications    Prior to Admission medications   Medication Sig Start Date End Date Taking? Authorizing Provider  albuterol (PROVENTIL HFA;VENTOLIN HFA) 108 (90 BASE) MCG/ACT inhaler Inhale 1-2 puffs into the lungs every 6 (six) hours as needed for wheezing or shortness of breath.    [provider]  aspirin 81 MG tablet Take 81 mg by mouth daily.    [provider]  atorvastatin (LIPITOR) 20 MG tablet Take 20 mg by mouth daily.    [provider]  calcium citrate-vitamin D (CITRACAL+D) 315-200 MG-UNIT per tablet Take 1 tablet by mouth 2 (two) times daily.    [provider]  Chlorphen-Phenyleph-APAP (CORICIDIN D COLD/FLU/SINUS) 2-5-325 MG TABS Take 2 tablets by mouth daily as needed (for cold/flu symptoms).    [provider]  FeFum-FePoly-FA-B Cmp-C-Biot (INTEGRA PLUS) CAPS Take 1 capsule by mouth daily.  [provider]  levothyroxine (SYNTHROID, LEVOTHROID) 200 MCG tablet Take 200 mcg by mouth daily before breakfast.    [provider]  lisinopril-hydrochlorothiazide (PRINZIDE,ZESTORETIC) 20-25 MG per tablet Take 1 tablet by mouth daily.    [provider]  NIFEdipine (PROCARDIA-XL/ADALAT-CC/NIFEDICAL-XL) 30 MG 24 hr tablet Take 30 mg by mouth daily.    [provider]    Family History Family History  Problem Relation Age of Onset  . Cancer Mother   . Hypertension Mother   . Breast cancer Maternal Aunt     Social History Social History   Tobacco Use  . Smoking status: Never  Smoker  Substance Use Topics  . Alcohol use: No  . Drug use: No     Allergies   Patient has no known allergies.   Review of Systems Review of Systems  All other systems reviewed and are negative.    Physical Exam Updated Vital Signs BP (!) 145/72   Pulse 91   Resp 15   Ht 1.626 m (5\' 4" )   Wt 123.4 kg   LMP 10/22/2011   SpO2 99%   BMI 46.69 kg/m   Physical Exam  Constitutional:  Non-toxic appearance.  HENT:  Head: Normocephalic and atraumatic.  Right Ear: External ear normal.  Left Ear: External ear normal.  Eyes: Conjunctivae are normal. Right eye exhibits no discharge. Left eye exhibits no discharge. No scleral icterus.  Neck: Neck supple. No tracheal deviation present.  Cardiovascular: Normal rate, regular rhythm and intact distal pulses.  Pulmonary/Chest: Effort normal. No stridor. No respiratory distress. She has wheezes. She has no rales.  Able to speak in full sentences  Abdominal: Soft. Bowel sounds are normal. She exhibits no distension. There is no tenderness. There is no rebound and no guarding.  Musculoskeletal: She exhibits no tenderness.       Right lower leg: She exhibits edema.       Left lower leg: She exhibits edema.  Neurological: She is alert. She has normal strength. No cranial nerve deficit (no facial droop, extraocular movements intact, no slurred speech) or sensory deficit. She exhibits normal muscle tone. She displays no seizure activity. Coordination normal.  Skin: Skin is warm and dry. No rash noted.  Psychiatric: She has a normal mood and affect.  Nursing note and vitals reviewed.    ED Treatments / Results  Labs (all labs ordered are listed, but only abnormal results are displayed) Labs Reviewed  BASIC METABOLIC PANEL - Abnormal; Notable for the following components:      Result Value   Chloride 97 (*)    Glucose, Bld 125 (*)    All other components within normal limits  CBC - Abnormal; Notable for the following components:    Hemoglobin 11.3 (*)    nRBC 0.3 (*)    All other components within normal limits  BRAIN NATRIURETIC PEPTIDE - Abnormal; Notable for the following components:   B Natriuretic Peptide 164.1 (*)    All other components within normal limits  TROPONIN I    EKG EKG Interpretation  Date/Time:  Tuesday August 29 2018 10:52:48 EST Ventricular Rate:  93 PR Interval:    QRS Duration: 83 QT Interval:  412 QTC Calculation: 513 R Axis:   91 Text Interpretation:  Sinus rhythm Probable left atrial enlargement Right axis deviation Low voltage, precordial leads Borderline T abnormalities, anterior leads Prolonged QT interval No significant change since last tracing Confirmed by Dorie Rank [phone]) on 08/29/2018 11:05:26 AM   Radiology  Dg Chest 2 View  Result Date: 08/29/2018 CLINICAL DATA:  Shortness of breath.  Respiratory illness. EXAM: CHEST - 2 VIEW COMPARISON:  11/16/2012 FINDINGS: Cardiac silhouette is prominent for size but probably accentuated by the lordotic view. Again noted are enlarged central vascular structures and cannot exclude vascular congestion or mild edema. There is some peribronchial cuffing or thickening. Patchy linear densities in the periphery of the mid left lung. Stable mild elevation of the right hemidiaphragm. Surgical clips in the lower neck region. No large pleural effusions. IMPRESSION: 1. Enlarged vascular structures with peribronchial thickening. Findings are suggestive for vascular congestion and mild pulmonary edema. 2. Streaky densities in the mid left lung are nonspecific but could be related to atelectasis. Consider short-term follow-up to see if this area resolves. 3. Mild cardiomegaly. Electronically Signed   By: Markus Daft M.D.   On: 08/29/2018 11:55    Procedures .Critical Care Performed by: Dorie Rank, MD Authorized by: Dorie Rank, MD   Critical care provider statement:    Critical care time (minutes):  35   Critical care was time spent personally by me on  the following activities:  Discussions with consultants, evaluation of patient's response to treatment, examination of patient, ordering and performing treatments and interventions, ordering and review of laboratory studies, ordering and review of radiographic studies, pulse oximetry, re-evaluation of patient's condition, obtaining history from patient or surrogate and review of old charts   (including critical care time)  Medications Ordered in ED Medications  albuterol (PROVENTIL) (2.5 MG/3ML) 0.083% nebulizer solution 5 mg (5 mg Nebulization Given 08/29/18 1103)  ipratropium (ATROVENT) nebulizer solution 0.5 mg (0.5 mg Nebulization Given 08/29/18 1104)     Initial Impression / Assessment and Plan / ED Course  I have reviewed the triage vital signs and the nursing notes.  Pertinent labs & imaging results that were available during my care of the patient were reviewed by me and considered in my medical decision making (see chart for details).  Clinical Course as of Aug 29 1253  Tue Aug 29, 2018  1241 O2 sat decreased when pt falls asleep.  Down to the 5s  although back into the 90s after deep breathing and increasing o2 sat   [JK]    Clinical Course User Index [JK] Dorie Rank, MD   Patient presented to the emergency room for evaluation of shortness of breath.  Patient had notable wheezing on exam.  She had some improvement with breathing treatments and steroids however she does continue to wheeze.  Patient's laboratory tests show mild increase in her BNP but I do not think this is clinically significant.  Chest x-ray shows some mild vascular congestion but I think the majority of her symptoms are related to bronchospasm.  Patient's oxygen saturation drops into the 70s and 80s when she falls asleep.  This quickly returned when she woke up and started breathing from her nose.  Considering her oxygen requirement I will consult the medical service for admission and further treatment.  Final  Clinical Impressions(s) / ED Diagnoses   Final diagnoses:  Severe asthma with acute exacerbation, unspecified whether persistent  Hypoxia     Dorie Rank, MD 08/29/18 1255

## 2018-08-30 ENCOUNTER — Observation Stay (HOSPITAL_COMMUNITY): Payer: BLUE CROSS/BLUE SHIELD

## 2018-08-30 DIAGNOSIS — E785 Hyperlipidemia, unspecified: Secondary | ICD-10-CM | POA: Diagnosis present

## 2018-08-30 DIAGNOSIS — I130 Hypertensive heart and chronic kidney disease with heart failure and stage 1 through stage 4 chronic kidney disease, or unspecified chronic kidney disease: Secondary | ICD-10-CM | POA: Diagnosis present

## 2018-08-30 DIAGNOSIS — E039 Hypothyroidism, unspecified: Secondary | ICD-10-CM

## 2018-08-30 DIAGNOSIS — E1122 Type 2 diabetes mellitus with diabetic chronic kidney disease: Secondary | ICD-10-CM | POA: Diagnosis present

## 2018-08-30 DIAGNOSIS — J45901 Unspecified asthma with (acute) exacerbation: Secondary | ICD-10-CM | POA: Diagnosis present

## 2018-08-30 DIAGNOSIS — Z7951 Long term (current) use of inhaled steroids: Secondary | ICD-10-CM | POA: Diagnosis not present

## 2018-08-30 DIAGNOSIS — I340 Nonrheumatic mitral (valve) insufficiency: Secondary | ICD-10-CM

## 2018-08-30 DIAGNOSIS — I5033 Acute on chronic diastolic (congestive) heart failure: Secondary | ICD-10-CM | POA: Diagnosis present

## 2018-08-30 DIAGNOSIS — J441 Chronic obstructive pulmonary disease with (acute) exacerbation: Secondary | ICD-10-CM | POA: Diagnosis present

## 2018-08-30 DIAGNOSIS — N183 Chronic kidney disease, stage 3 (moderate): Secondary | ICD-10-CM | POA: Diagnosis present

## 2018-08-30 DIAGNOSIS — D509 Iron deficiency anemia, unspecified: Secondary | ICD-10-CM | POA: Diagnosis present

## 2018-08-30 DIAGNOSIS — Z7982 Long term (current) use of aspirin: Secondary | ICD-10-CM | POA: Diagnosis not present

## 2018-08-30 DIAGNOSIS — I10 Essential (primary) hypertension: Secondary | ICD-10-CM | POA: Diagnosis not present

## 2018-08-30 DIAGNOSIS — Z6841 Body Mass Index (BMI) 40.0 and over, adult: Secondary | ICD-10-CM | POA: Diagnosis not present

## 2018-08-30 DIAGNOSIS — E890 Postprocedural hypothyroidism: Secondary | ICD-10-CM | POA: Diagnosis present

## 2018-08-30 DIAGNOSIS — Z79899 Other long term (current) drug therapy: Secondary | ICD-10-CM | POA: Diagnosis not present

## 2018-08-30 DIAGNOSIS — Z85850 Personal history of malignant neoplasm of thyroid: Secondary | ICD-10-CM | POA: Diagnosis not present

## 2018-08-30 DIAGNOSIS — J9601 Acute respiratory failure with hypoxia: Secondary | ICD-10-CM | POA: Diagnosis present

## 2018-08-30 LAB — BASIC METABOLIC PANEL
Anion gap: 7 (ref 5–15)
BUN: 21 mg/dL (ref 8–23)
CO2: 36 mmol/L — ABNORMAL HIGH (ref 22–32)
Calcium: 9 mg/dL (ref 8.9–10.3)
Chloride: 94 mmol/L — ABNORMAL LOW (ref 98–111)
Creatinine, Ser: 1.08 mg/dL — ABNORMAL HIGH (ref 0.44–1.00)
GFR calc Af Amer: >60 mL/min (ref >60)
GFR calc non Af Amer: 53 mL/min — ABNORMAL LOW (ref >60)
Glucose, Bld: 124 mg/dL — ABNORMAL HIGH (ref 70–99)
Potassium: 4.2 mmol/L (ref 3.5–5.1)
Sodium: 137 mmol/L (ref 135–145)

## 2018-08-30 LAB — CBC
HCT: 37.8 % (ref 36.0–46.0)
Hemoglobin: 11.2 g/dL — ABNORMAL LOW (ref 12.0–15.0)
MCH: 29 pg (ref 26.0–34.0)
MCHC: 29.6 g/dL — ABNORMAL LOW (ref 30.0–36.0)
MCV: 97.9 fL (ref 80.0–100.0)
Platelets: 202 10*3/uL (ref 150–400)
RBC: 3.86 MIL/uL — ABNORMAL LOW (ref 3.87–5.11)
RDW: 13.7 % (ref 11.5–15.5)
WBC: 7.9 10*3/uL (ref 4.0–10.5)
nRBC: 0.4 % — ABNORMAL HIGH (ref 0.0–0.2)

## 2018-08-30 LAB — GLUCOSE, CAPILLARY
Glucose-Capillary: 99 mg/dL (ref 70–99)
Glucose-Capillary: 105 mg/dL — ABNORMAL HIGH (ref 70–99)
Glucose-Capillary: 124 mg/dL — ABNORMAL HIGH (ref 70–99)
Glucose-Capillary: 145 mg/dL — ABNORMAL HIGH (ref 70–99)

## 2018-08-30 LAB — HIV ANTIBODY (ROUTINE TESTING W REFLEX): HIV Screen 4th Generation wRfx: NONREACTIVE

## 2018-08-30 LAB — ECHOCARDIOGRAM LIMITED
Height: 64 in
Weight: 4352 oz

## 2018-08-30 MED ORDER — IPRATROPIUM-ALBUTEROL 0.5-2.5 (3) MG/3ML IN SOLN
3.0000 mL | Freq: Three times a day (TID) | RESPIRATORY_TRACT | Status: DC
  Administered 2018-08-30 – 2018-08-31 (×3): 3 mL via RESPIRATORY_TRACT
  Filled 2018-08-30 (×3): qty 3

## 2018-08-30 MED ORDER — FUROSEMIDE 10 MG/ML IJ SOLN
20.0000 mg | Freq: Once | INTRAMUSCULAR | Status: AC
  Administered 2018-08-30: 20 mg via INTRAVENOUS
  Filled 2018-08-30: qty 2

## 2018-08-30 MED ORDER — GUAIFENESIN ER 600 MG PO TB12
600.0000 mg | ORAL_TABLET | Freq: Two times a day (BID) | ORAL | Status: DC
  Administered 2018-08-30 – 2018-09-01 (×5): 600 mg via ORAL
  Filled 2018-08-30 (×5): qty 1

## 2018-08-30 MED ORDER — METHYLPREDNISOLONE SODIUM SUCC 40 MG IJ SOLR
40.0000 mg | Freq: Four times a day (QID) | INTRAMUSCULAR | Status: DC
  Administered 2018-08-30 – 2018-08-31 (×4): 40 mg via INTRAVENOUS
  Filled 2018-08-30 (×4): qty 1

## 2018-08-30 MED ORDER — FUROSEMIDE 10 MG/ML IJ SOLN: 20.0000 mg | Freq: Two times a day (BID) | INTRAMUSCULAR | Status: DC

## 2018-08-30 MED ORDER — BENZONATATE 100 MG PO CAPS
100.0000 mg | ORAL_CAPSULE | Freq: Three times a day (TID) | ORAL | Status: DC | PRN
  Administered 2018-09-01: 100 mg via ORAL
  Filled 2018-08-30: qty 1

## 2018-08-30 MED ORDER — PERFLUTREN LIPID MICROSPHERE
1.0000 mL | INTRAVENOUS | Status: AC | PRN
  Administered 2018-08-30: 2 mL via INTRAVENOUS
  Filled 2018-08-30: qty 10

## 2018-08-30 NOTE — Progress Notes (Signed)
Pt continously desats to 80% on venturi mask 28% fio2 while sleeping. Pt bed is elevated to 60 degrees without no improvement. Provider paged. Provider called back and advised nurse to call RT . RT paged.

## 2018-08-30 NOTE — Plan of Care (Signed)
Pt verbalized understanding plan of care. Diagnostic tests improving. Pt avoiding respiratory complications during shift. After placed on 4L of [place] to sleep, oxygen saturations remained above 92%. Pt ambulating with steady gait to the bathroom to urinate. Pt progressing towards discharge.

## 2018-08-30 NOTE — Progress Notes (Signed)
Alert and oriented female admitted to unit. Oriented to room, call bell at side. Pt independent with ambulation.  No c/o at this time.

## 2018-08-30 NOTE — Progress Notes (Signed)
Receiving RN states not able to take report till 250pm?? Patient will be transferred to [id number]-[id number]

## 2018-08-30 NOTE — H&P (Addendum)
History and Physical    Bridget Lamb IRW:[id number] DOB: [birth year] DOA: 08/29/2018  I have briefly reviewed the patient's prior medical [facility] in [facility]  PCP: [name], PA-C  Patient coming from: obs to inpt  Chief Complaint: dyspnea  HPI: Bridget Lamb is a 62 y.o. female with medical history significant of obesity, essential HTN, dyslipidemia, diastolic CHF who presented to the ED today with c/o SOB x 1 week. Went to PCP yesterday, was given steroids, breathing tx, Z pack and lasix. She took the first 2 days of the Z pack and steroids but did not take the lasix. Returned for followup today and was noted to be hypoxic to the low 80s.   She reports having a cough productive of yellow sputum for the last week, no fever/chills. She has a h/o bronchitis and PNA, has never been admitted for asthma exacerbation. She reports she has albuterol nebs at home but only uses it about 10 times per year. She reports some chest pressure/discomfort but describes it mostly as a feeling that she can't take a deep breath.  Overnight, continued to require O2.  Echo done with unclear results.  Plan to repeat limited echo.   Today feels like she is not breathing as hard as yesterday but still significantly short of breath and having rib pain with coughing.    Review of Systems: As per HPI otherwise 10 point review of systems negative.   Past Medical History:  Diagnosis Date  . Allergic rhinitis   . Anemia    hematology, Dr. Julien Nordmann, last 01/2011  . CHF (congestive heart failure) ([place]) 2004  . Complication of anesthesia   . Dyslipidemia   . Dyspnea   . H/O echocardiogram 01/07/04   mild LVH, nl LV systolic function, EF 76%, left atrial enlargement, mildly thickened aortic and mitral valves  . History of thyroid cancer   . Hypertension   . Obesity   . Obesity, Class III, BMI 40-49.9 (morbid obesity) ([place]) 10/09/2013  . Osteopenia    BMD 02/27/08   . Osteopenia   . PONV  (postoperative nausea and vomiting)   . Umbilical hernia   . Unspecified essential hypertension 10/09/2013  . Unspecified hypothyroidism 10/09/2013    Past Surgical History:  Procedure Laterality Date  . BONE MARROW BIOPSY    . CARDIAC CATHETERIZATION  2005  . CHOLECYSTECTOMY  2003  . COLONOSCOPY  2008  . HERNIA REPAIR  2014   Ventral Hernia Repair  . THYROID SURGERY     partial thyroidectomy  . TOTAL THYROIDECTOMY  2009  . VENTRAL HERNIA REPAIR N/A 10/07/2013   Procedure: HERNIA REPAIR VENTRAL ADULT;  Surgeon: Gwenyth Ober, MD;  Location: [facility];  Service: General;  Laterality: N/A;     reports that she has never smoked. She has never used smokeless tobacco. She reports that she does not drink alcohol or use drugs.  No Known Allergies  Family History  Problem Relation Age of Onset  . Cancer Mother   . Hypertension Mother   . Breast cancer Maternal Aunt     Prior to Admission medications   Medication Sig Start Date End Date Taking? Authorizing Provider  albuterol (PROVENTIL) (2.5 MG/3ML) 0.083% nebulizer solution Take 2.5 mg by nebulization every 6 (six) hours as needed for wheezing or shortness of breath.   Yes [provider]  aspirin 81 MG tablet Take 81 mg by mouth daily.   Yes [provider]  azithromycin (ZITHROMAX) 250  MG tablet Take 250-500 mg by mouth See admin instructions. Take '500mg'$  (2 tablets) on day 1, then '250mg'$  (1 tablet) daily for 4 days. 08/28/18  Yes [provider]  calcium citrate-vitamin D (CITRACAL+D) 315-200 MG-UNIT per tablet Take 1 tablet by mouth 2 (two) times daily.   Yes [provider]  Fe Fum-FePoly-Vit C-Vit B3 (INTEGRA PO) Take 1 tablet by mouth daily.   Yes [provider]  levothyroxine (SYNTHROID, LEVOTHROID) 150 MCG tablet Take 150 mcg by mouth daily before breakfast.    Yes [provider]  lisinopril-hydrochlorothiazide (PRINZIDE,ZESTORETIC) 20-25 MG per tablet Take 1 tablet by mouth  daily.   Yes [provider]  loratadine (CLARITIN) 10 MG tablet Take 10 mg by mouth every morning.   Yes [provider]  NIFEdipine (PROCARDIA-XL/ADALAT-CC/NIFEDICAL-XL) 30 MG 24 hr tablet Take 30 mg by mouth daily.   Yes [provider]  olopatadine (PATANOL) 0.1 % ophthalmic solution Place 1 drop into both eyes 2 (two) times daily. 04/07/18  Yes [provider]  rosuvastatin (CRESTOR) 10 MG tablet Take 10 mg by mouth at bedtime. 11/28/17  Yes [provider]    Physical Exam: Vitals:   08/29/18 2327 08/30/18 0304 08/30/18 0544 08/30/18 1028  BP:   134/75   Pulse:   75   Resp:   18   Temp:   98 F (36.7 C)   TempSrc:   Oral   SpO2: 94% 94% 97% 100%  Weight:      Height:          Constitutional: NAD, calm, on3L O2 Eyes: PERRL, lids and conjunctivae normal ENMT: Mucous membranes are moist. Posterior pharynx clear of any exudate or lesions.Normal dentition.  Neck: normal, supple, no masses, no thyromegaly Respiratory: diminished breath sounds with wheezing (inspiratory and expiratory) and crackles at bases, able to carry on conversation w/o increased work of breathing Cardiovascular: Regular rate and rhythm, no murmurs / rubs / gallops. + extremity edema. 2+ pedal pulses.  Abdomen: no tenderness, no masses palpated. Bowel sounds positive.  Musculoskeletal: no clubbing / cyanosis. Normal muscle tone.  Skin: no rashes, lesions, ulcers. No induration Neurologic: CN 2-12 grossly intact. Strength 5/5 in all 4.  Psychiatric: Normal judgment and insight. Alert and oriented x 3. Normal mood.   Labs on Admission: I have personally reviewed following labs and imaging studies  CBC: Recent Labs  Lab 08/29/18 1057 08/30/18 0452  WBC 7.0 7.9  HGB 11.3* 11.2*  HCT 37.7 37.8  MCV 97.4 97.9  PLT 170 732   Basic Metabolic Panel: Recent Labs  Lab 08/29/18 1057 08/30/18 0452  NA 135 137  K 3.7 4.2  CL 97* 94*  CO2 29 36*  GLUCOSE 125*  124*  BUN 11 21  CREATININE 0.88 1.08*  CALCIUM 9.0 9.0   GFR: Estimated Creatinine Clearance: 69.2 mL/min (A) (by C-G formula based on SCr of 1.08 mg/dL (H)). Liver Function Tests: No results for input(s): AST, ALT, ALKPHOS, BILITOT, PROT, ALBUMIN in the last 168 hours. No results for input(s): LIPASE, AMYLASE in the last 168 hours. No results for input(s): AMMONIA in the last 168 hours. Coagulation Profile: No results for input(s): INR, PROTIME in the last 168 hours. Cardiac Enzymes: Recent Labs  Lab 08/29/18 1057  TROPONINI <0.03   BNP (last 3 results) No results for input(s): PROBNP in the last 8760 hours. HbA1C: No results for input(s): HGBA1C in the last 72 hours. CBG: Recent Labs  Lab 08/29/18 1721 08/29/18 2124  08/30/18 0650  GLUCAP 147* 128* 99   Lipid Profile: No results for input(s): CHOL, HDL, LDLCALC, TRIG, CHOLHDL, LDLDIRECT in the last 72 hours. Thyroid Function Tests: No results for input(s): TSH, T4TOTAL, FREET4, T3FREE, THYROIDAB in the last 72 hours. Anemia Panel: No results for input(s): VITAMINB12, FOLATE, FERRITIN, TIBC, IRON, RETICCTPCT in the last 72 hours. Urine analysis:    Component Value Date/Time   COLORURINE AMBER (A) 10/06/2013 1752   APPEARANCEUR CLOUDY (A) 10/06/2013 1752   LABSPEC 1.024 10/06/2013 1752   PHURINE 5.5 10/06/2013 1752   GLUCOSEU NEGATIVE 10/06/2013 1752   HGBUR NEGATIVE 10/06/2013 1752   BILIRUBINUR SMALL (A) 10/06/2013 1752   KETONESUR NEGATIVE 10/06/2013 1752   PROTEINUR 30 (A) 10/06/2013 1752   UROBILINOGEN 1.0 10/06/2013 1752   NITRITE NEGATIVE 10/06/2013 1752   LEUKOCYTESUR MODERATE (A) 10/06/2013 1752     Radiological Exams on Admission: Dg Chest 2 View  Result Date: 08/29/2018 CLINICAL DATA:  Shortness of breath.  Respiratory illness. EXAM: CHEST - 2 VIEW COMPARISON:  11/16/2012 FINDINGS: Cardiac silhouette is prominent for size but probably accentuated by the lordotic view. Again noted are enlarged  central vascular structures and cannot exclude vascular congestion or mild edema. There is some peribronchial cuffing or thickening. Patchy linear densities in the periphery of the mid left lung. Stable mild elevation of the right hemidiaphragm. Surgical clips in the lower neck region. No large pleural effusions. IMPRESSION: 1. Enlarged vascular structures with peribronchial thickening. Findings are suggestive for vascular congestion and mild pulmonary edema. 2. Streaky densities in the mid left lung are nonspecific but could be related to atelectasis. Consider short-term follow-up to see if this area resolves. 3. Mild cardiomegaly. Electronically Signed   By: Markus Daft M.D.   On: 08/29/2018 11:55      Assessment/Plan Principal Problem:   Acute respiratory failure with hypoxia (HCC) Active Problems:   Essential hypertension, benign   Dyslipidemia   Hypothyroidism   Obesity, Class III, BMI 40-49.9 (morbid obesity) (HCC)   CHF (congestive heart failure) (HCC)   Iron deficiency anemia   Acute respiratory failure with hypoxemia (HCC)   Acute respiratory failure with hypoxia:  -most likely multifactorial -on 3L O2 currently -wean O2 to off, keeping sats 90-92% -? CHF/bronchitis/obesity -IV steroids -IV lasix-- add strict I/Os and daily weights give another dose tonight and in AM will need re-assessment -albuterol and atrovent nebs q4h scheduled -flutter valve -z pac -echo done:  Impressions: - Normal left ventricular function, EF 60-65%. Unable to assess   wall motion due to image quality (See Recommendations).   Respiratory related leftward septal shift. High left ventricular   filling pressure. Normal right ventricular size and function.   Mild mitral regurgitation. Moderate-severe LA enlargement. RA   pressure estimate 15 mmHg. Trace anterior pericardial effusion. Recommendations: 1. Consider focused echocardiogram to evaluate for pericardial    constriction with respirometer  study. Can also consider cardiac    MRI with free breathing sequences and IV gadolinium for    pericardial enhancement. 2. Consider focused echocardiogram with Definity for left    ventricular enhancement, since wall motion was not able to be    determined on today&'s exam. -order limited echos as recommended above -? Asthma history-- none on record-- PFTs as outpatient  Essential HTN -cont lisinopril/HCTZ, procardia-XL  Hypothyroidism -cont home levothyroxine 200 mg   IDA -cont home iron supplement  Dyslipidemia -cont lipitor, baby ASA  Morbid obesity Estimated body mass index is 46.69 kg/m as calculated from  the following:   Height as of this encounter: '5\' 4"'$  (1.626 m).   Weight as of this encounter: 123.4 kg.   Admission status:    At the time of admission, it appears that the appropriate admission status for this patient is INPATIENT. This is judged to be reasonable and necessary in order to provide the required high service intensity to ensure the patient's safety given the presenting symptoms, physical exam findings, and initial radiographic and laboratory data in the context of their chronic comorbidities. Current circumstances are acute respiratory failure, and it is felt to place patient at high risk for further clinical deterioration threatening life, limb, or organ. Moreover, it is my clinical judgment that the patient will require inpatient hospital care spanning beyond 2 midnights from the point of admission and that early discharge would result in unnecessary risk of decompensation and readmission or threat to life, limb or bodily function.   Geradine Girt [facility]   If 7PM-7AM, please contact night-coverage [email or web address] Password [email or web address]  08/30/2018, 10:40 AM

## 2018-08-31 DIAGNOSIS — J9601 Acute respiratory failure with hypoxia: Secondary | ICD-10-CM

## 2018-08-31 DIAGNOSIS — D509 Iron deficiency anemia, unspecified: Secondary | ICD-10-CM

## 2018-08-31 DIAGNOSIS — I10 Essential (primary) hypertension: Secondary | ICD-10-CM

## 2018-08-31 DIAGNOSIS — J441 Chronic obstructive pulmonary disease with (acute) exacerbation: Secondary | ICD-10-CM

## 2018-08-31 DIAGNOSIS — I5033 Acute on chronic diastolic (congestive) heart failure: Secondary | ICD-10-CM

## 2018-08-31 LAB — BASIC METABOLIC PANEL
Anion gap: 7 (ref 5–15)
BUN: 28 mg/dL — ABNORMAL HIGH (ref 8–23)
CO2: 38 mmol/L — ABNORMAL HIGH (ref 22–32)
Calcium: 9.4 mg/dL (ref 8.9–10.3)
Chloride: 93 mmol/L — ABNORMAL LOW (ref 98–111)
Creatinine, Ser: 1.09 mg/dL — ABNORMAL HIGH (ref 0.44–1.00)
GFR calc Af Amer: >60 mL/min (ref >60)
GFR calc non Af Amer: 53 mL/min — ABNORMAL LOW (ref >60)
Glucose, Bld: 121 mg/dL — ABNORMAL HIGH (ref 70–99)
Potassium: 4.7 mmol/L (ref 3.5–5.1)
Sodium: 138 mmol/L (ref 135–145)

## 2018-08-31 LAB — GLUCOSE, CAPILLARY
Glucose-Capillary: 108 mg/dL — ABNORMAL HIGH (ref 70–99)
Glucose-Capillary: 133 mg/dL — ABNORMAL HIGH (ref 70–99)
Glucose-Capillary: 117 mg/dL — ABNORMAL HIGH (ref 70–99)
Glucose-Capillary: 113 mg/dL — ABNORMAL HIGH (ref 70–99)

## 2018-08-31 LAB — CBC
HCT: 38.9 % (ref 36.0–46.0)
Hemoglobin: 11.4 g/dL — ABNORMAL LOW (ref 12.0–15.0)
MCH: 28.4 pg (ref 26.0–34.0)
MCHC: 29.3 g/dL — ABNORMAL LOW (ref 30.0–36.0)
MCV: 97 fL (ref 80.0–100.0)
Platelets: 213 10*3/uL (ref 150–400)
RBC: 4.01 MIL/uL (ref 3.87–5.11)
RDW: 13.3 % (ref 11.5–15.5)
WBC: 8.8 10*3/uL (ref 4.0–10.5)
nRBC: 0 % (ref 0.0–0.2)

## 2018-08-31 MED ORDER — IPRATROPIUM-ALBUTEROL 0.5-2.5 (3) MG/3ML IN SOLN
3.0000 mL | Freq: Two times a day (BID) | RESPIRATORY_TRACT | Status: DC
  Administered 2018-08-31 – 2018-09-01 (×2): 3 mL via RESPIRATORY_TRACT
  Filled 2018-08-31 (×2): qty 3

## 2018-08-31 MED ORDER — MOMETASONE FURO-FORMOTEROL FUM 100-5 MCG/ACT IN AERO
2.0000 | INHALATION_SPRAY | Freq: Two times a day (BID) | RESPIRATORY_TRACT | Status: DC
  Administered 2018-08-31 – 2018-09-01 (×2): 2 via RESPIRATORY_TRACT
  Filled 2018-08-31 (×2): qty 8.8

## 2018-08-31 MED ORDER — FUROSEMIDE 10 MG/ML IJ SOLN
40.0000 mg | Freq: Two times a day (BID) | INTRAMUSCULAR | Status: DC
  Administered 2018-08-31 – 2018-09-01 (×3): 40 mg via INTRAVENOUS
  Filled 2018-08-31 (×3): qty 4

## 2018-08-31 MED ORDER — METHYLPREDNISOLONE SODIUM SUCC 40 MG IJ SOLR
40.0000 mg | Freq: Three times a day (TID) | INTRAMUSCULAR | Status: DC
  Administered 2018-08-31 – 2018-09-01 (×3): 40 mg via INTRAVENOUS
  Filled 2018-08-31 (×3): qty 1

## 2018-08-31 NOTE — Plan of Care (Signed)
  Problem: Activity: Goal: Risk for activity intolerance will decrease Outcome: Progressing   Problem: Nutrition: Goal: Adequate nutrition will be maintained Outcome: Progressing   Problem: Coping: Goal: Level of anxiety will decrease Outcome: Progressing   Problem: Elimination: Goal: Will not experience complications related to urinary retention Outcome: Progressing   Problem: Pain Managment: Goal: General experience of comfort will improve Outcome: Progressing   Problem: Safety: Goal: Ability to remain free from injury will improve Outcome: Progressing   Problem: Skin Integrity: Goal: Risk for impaired skin integrity will decrease Outcome: Progressing   

## 2018-08-31 NOTE — Progress Notes (Signed)
Patient ID: Bridget Lamb, female   DOB: [birth year], 62 y.o.   MRN: [id number]  PROGRESS NOTE    Bridget Lamb  FUX:[id number] DOB: 12-03-1955 DOA: 08/29/2018 PCP: [name]   Brief Narrative:  62 year old female with history of obesity, essential hypertension, dyslipidemia, diastolic CHF presented with worsening shortness of breath and hypoxia.  She was admitted for probable COPD versus CHF exacerbation.   Assessment & Plan:   Principal Problem:   Acute respiratory failure with hypoxia (HCC) Active Problems:   Essential hypertension, benign   Dyslipidemia   Hypothyroidism   Obesity, Class III, BMI 40-49.9 (morbid obesity) (HCC)   CHF (congestive heart failure) (HCC)   Iron deficiency anemia   Acute respiratory failure with hypoxemia (HCC)   Acute hypoxic respiratory failure probably from COPD versus CHF exacerbation -Patient required 4 L oxygen via nasal cannula at some point. -Respiratory status has improved.  Currently on room air.  We will add incentive spirometry  COPD exacerbation -Improving.  Decrease Solu-Medrol to 40 milligrams IV every 8 hours.  Add Dulera.  Continue duo nebs -Might need outpatient pulmonary evaluation  Acute on chronic diastolic CHF -Echo showed EF of 60 to 65% with some pericardial constriction.  Consulted cardiology/Dr. Einar Gip -Strict input and output.  Daily weights.  Will discontinue hydrochlorothiazide.  Start Lasix 40 mg IV every 12 hours  Essential hypertension -Continue lisinopril and nifedipine.  Discontinue hydrochlorothiazide.  Start Lasix  Hypothyroidism -Continue levothyroxine  Iron deficiency anemia -Continue home iron supplement  Dyslipidemia -Continue Lipitor  Morbid obesity -Outpatient follow-up -Patient might need outpatient sleep study to rule out sleep apnea   DVT prophylaxis: Lovenox Code Status: Full Family Communication: None at bedside Disposition Plan: Probable discharge in 1 to 2 days if  clinically improves  Consultants: Cardiology  Procedures:  Echo Study Conclusions  - Left ventricle: The cavity size was normal. Systolic function was   normal. The estimated ejection fraction was in the range of 60%   to 65%. Images were inadequate for LV wall motion assessment.   Abnormal septal shudder as well as respiratory related leftward   septal shift. Doppler parameters are consistent with high   ventricular filling pressure (E/e&' = 23). - Aortic valve: There was no stenosis. There was no regurgitation.   Mean gradient (S): 6 mm Hg. Valve area (VTI): 1.89 cm^2. Valve   area (Vmax): 1.77 cm^2. Valve area (Vmean): 1.63 cm^2. - Mitral valve: Calcified annulus. Transvalvular velocity was   within the normal range. There was no evidence for stenosis.   There was mild regurgitation. - Left atrium: The atrium was moderately to severely dilated. - Right ventricle: The cavity size was normal. Wall thickness was   normal. Systolic function was normal. - Right atrium: The atrium was mildly dilated. - Tricuspid valve: There was trivial regurgitation. - Inferior vena cava: The vessel was dilated. The respirophasic   diameter changes were blunted (< 50%), consistent with elevated   central venous pressure. - Pericardium, extracardiac: A trivial pericardial effusion was   identified anterior to the heart. - Increased flow velocities, may be secondary to anemia,   thyrotoxicosis, or high flow state.  Impressions:  - Normal left ventricular function, EF 60-65%. Unable to assess   wall motion due to image quality (See Recommendations).   Respiratory related leftward septal shift. High left ventricular   filling pressure. Normal right ventricular size and function.   Mild mitral regurgitation. Moderate-severe LA enlargement. RA   pressure estimate 15  mmHg. Trace anterior pericardial effusion.  Recommendations:  1. Consider focused echocardiogram to evaluate for pericardial     constriction with respirometer study. Can also consider cardiac    MRI with free breathing sequences and IV gadolinium for    pericardial enhancement. 2. Consider focused echocardiogram with Definity for left    ventricular enhancement, since wall motion was not able to be    determined on today&'s exam.  Antimicrobials: Zithromax   Subjective: Patient seen and examined at bedside.  She feels slightly better.  Still intermittent coughing severely.  No overnight fever, nausea, vomiting or chest pain.  Objective: Vitals:   08/31/18 0105 08/31/18 0628 08/31/18 0808 08/31/18 0921  BP:  119/63  (!) 145/72  Pulse: 82 73  81  Resp:  18    Temp:  98.4 F (36.9 C)  98.4 F (36.9 C)  TempSrc:  Oral  Oral  SpO2: 96% 98% 91% 91%  Weight:  118.4 kg    Height:        Intake/Output Summary (Last 24 hours) at 08/31/2018 1008 Last data filed at 08/31/2018 3419 Gross per 24 hour  Intake 240 ml  Output 1500 ml  Net -1260 ml   Filed Weights   08/29/18 1008 08/31/18 0628  Weight: 123.4 kg 118.4 kg    Examination:  General exam: Appears calm and comfortable  Respiratory system: Bilateral decreased breath sounds at bases with some basilar crackles and very minimal wheezing Cardiovascular system: S1 & S2 heard, Rate controlled Gastrointestinal system: Abdomen is morbidly obese, nondistended, soft and nontender. Normal bowel sounds heard. Extremities: No cyanosis, clubbing; 1-2+ edema   Data Reviewed: I have personally reviewed following labs and imaging studies  CBC: Recent Labs  Lab 08/29/18 1057 08/30/18 0452 08/31/18 0439  WBC 7.0 7.9 8.8  HGB 11.3* 11.2* 11.4*  HCT 37.7 37.8 38.9  MCV 97.4 97.9 97.0  PLT 170 202 379   Basic Metabolic Panel: Recent Labs  Lab 08/29/18 1057 08/30/18 0452 08/31/18 0439  NA 135 137 138  K 3.7 4.2 4.7  CL 97* 94* 93*  CO2 29 36* 38*  GLUCOSE 125* 124* 121*  BUN 11 21 28*  CREATININE 0.88 1.08* 1.09*  CALCIUM 9.0 9.0 9.4    GFR: Estimated Creatinine Clearance: 66.9 mL/min (A) (by C-G formula based on SCr of 1.09 mg/dL (H)). Liver Function Tests: No results for input(s): AST, ALT, ALKPHOS, BILITOT, PROT, ALBUMIN in the last 168 hours. No results for input(s): LIPASE, AMYLASE in the last 168 hours. No results for input(s): AMMONIA in the last 168 hours. Coagulation Profile: No results for input(s): INR, PROTIME in the last 168 hours. Cardiac Enzymes: Recent Labs  Lab 08/29/18 1057  TROPONINI <0.03   BNP (last 3 results) No results for input(s): PROBNP in the last 8760 hours. HbA1C: No results for input(s): HGBA1C in the last 72 hours. CBG: Recent Labs  Lab 08/30/18 0650 08/30/18 1259 08/30/18 1720 08/30/18 2106 08/31/18 0749  GLUCAP 99 105* 124* 145* 108*   Lipid Profile: No results for input(s): CHOL, HDL, LDLCALC, TRIG, CHOLHDL, LDLDIRECT in the last 72 hours. Thyroid Function Tests: No results for input(s): TSH, T4TOTAL, FREET4, T3FREE, THYROIDAB in the last 72 hours. Anemia Panel: No results for input(s): VITAMINB12, FOLATE, FERRITIN, TIBC, IRON, RETICCTPCT in the last 72 hours. Sepsis Labs: No results for input(s): PROCALCITON, LATICACIDVEN in the last 168 hours.  No results found for this or any previous visit (from the past 240 hour(s)).  Radiology Studies: Dg Chest 2 View  Result Date: 08/29/2018 CLINICAL DATA:  Shortness of breath.  Respiratory illness. EXAM: CHEST - 2 VIEW COMPARISON:  11/16/2012 FINDINGS: Cardiac silhouette is prominent for size but probably accentuated by the lordotic view. Again noted are enlarged central vascular structures and cannot exclude vascular congestion or mild edema. There is some peribronchial cuffing or thickening. Patchy linear densities in the periphery of the mid left lung. Stable mild elevation of the right hemidiaphragm. Surgical clips in the lower neck region. No large pleural effusions. IMPRESSION: 1. Enlarged vascular structures with  peribronchial thickening. Findings are suggestive for vascular congestion and mild pulmonary edema. 2. Streaky densities in the mid left lung are nonspecific but could be related to atelectasis. Consider short-term follow-up to see if this area resolves. 3. Mild cardiomegaly. Electronically Signed   By: Markus Daft M.D.   On: 08/29/2018 11:55        Scheduled Meds: . aspirin  81 mg Oral Daily  . atorvastatin  20 mg Oral q1800  . azithromycin  250 mg Oral Daily  . enoxaparin (LOVENOX) injection  60 mg Subcutaneous Q24H  . guaiFENesin  600 mg Oral BID  . lisinopril  20 mg Oral Daily   And  . hydrochlorothiazide  25 mg Oral Daily  . insulin aspart  0-20 Units Subcutaneous TID WC  . insulin aspart  0-5 Units Subcutaneous QHS  . ipratropium-albuterol  3 mL Nebulization BID  . levothyroxine  200 mcg Oral Q0600  . methylPREDNISolone (SOLU-MEDROL) injection  40 mg Intravenous Q6H  . NIFEdipine  30 mg Oral Daily   Continuous Infusions:   LOS: 1 day        Aline August, MD [facility] Pager [phone]  If 7PM-7AM, please contact night-coverage [email or web address] Password [email or web address] 08/31/2018, 10:08 AM

## 2018-08-31 NOTE — Progress Notes (Signed)
I will see her in the morning. Chart review compete. Do not suspect pericardial restriction  And final recommendation to come. Also the MRI can be done when she is more stable clinically so RR is not as high as well.   JG

## 2018-08-31 NOTE — Progress Notes (Signed)
Physical Therapy Evaluation Patient Details Name: Bridget Lamb MRN: [id number] DOB: [birth year] Today's Date: 08/31/2018   History of Present Illness  Patient is a 62 y/o female presenting to [facility] with primary complaints of dyspnea. Admitted with Acute respiratory failure with hypoxia. PMH significant for obesity, essential HTN, dyslipidemia, diastolic CHF.  Clinical Impression  Bridget Lamb is a very pleasant 49 y/p female admitted with the above listed diagnosis. Patient reports that prior to admission she was independent with all aspects of mobility. Patient today performing transfers at Mod I and gait at Sup for general safety concerns. Patient on RA with resting sats at 95-97%, with mobility sats dropping to 87% wiwth HR up to ~110's. NO LOB or overt instability with gait. Patient reports family available upon discharge to assist at home as needed. No further acute PT needs identified. PT to sign off.      Follow Up Recommendations No PT follow up    Equipment Recommendations  None recommended by PT    Recommendations for Other Services       Precautions / Restrictions Precautions Precautions: Fall Restrictions Weight Bearing Restrictions: No      Mobility  Bed Mobility               General bed mobility comments: seated EOB  Transfers Overall transfer level: Modified independent Equipment used: None             General transfer comment: Mod I with patietn reporting she has been ambulatory to restroom without issue  Ambulation/Gait Ambulation/Gait assistance: Supervision Gait Distance (Feet): 200 Feet Assistive device: None Gait Pattern/deviations: Step-through pattern;Decreased stride length;Trunk flexed;Narrow base of support Gait velocity: varying   General Gait Details: requires 1 standing rest breaks due to SOB - O2 on RA drop to 87% - rebounds with PLB  Stairs            Wheelchair Mobility    Modified Rankin (Stroke Patients Only)        Balance Overall balance assessment: Mild deficits observed, not formally tested                                           Pertinent Vitals/Pain Pain Assessment: No/denies pain    Home Living Family/patient expects to be discharged to:: Private residence Living Arrangements: Children;Other relatives Available Help at Discharge: Family Type of Home: House Home Access: Level entry     Home Layout: Two level Home Equipment: None      Prior Function Level of Independence: Independent         Comments: works Therapist, art for [employer]        Extremity/Trunk Assessment   Upper Extremity Assessment Upper Extremity Assessment: Overall WFL for tasks assessed    Lower Extremity Assessment Lower Extremity Assessment: Overall WFL for tasks assessed    Cervical / Trunk Assessment Cervical / Trunk Assessment: Normal  Communication   Communication: No difficulties  Cognition Arousal/Alertness: Awake/alert Behavior During Therapy: WFL for tasks assessed/performed Overall Cognitive Status: Within Functional Limits for tasks assessed                                        General Comments General comments (skin integrity, edema, etc.): very motivated to return home  Exercises     Assessment/Plan    PT Assessment Patent does not need any further PT services  PT Problem List         PT Treatment Interventions      PT Goals (Current goals can be found in the Care Plan section)  Acute Rehab PT Goals Patient Stated Goal: return home to grandsons PT Goal Formulation: With patient Time For Goal Achievement: 08/31/18 Potential to Achieve Goals: Good    Frequency     Barriers to discharge        Co-evaluation               AM-PAC PT "6 Clicks" Daily Activity  Outcome Measure Difficulty turning over in bed (including adjusting bedclothes, sheets and blankets)?: A Little Difficulty moving from  lying on back to sitting on the side of the bed? : A Little Difficulty sitting down on and standing up from a chair with arms (e.g., wheelchair, bedside commode, etc,.)?: A Little Help needed moving to and from a bed to chair (including a wheelchair)?: A Little Help needed walking in hospital room?: A Little Help needed climbing 3-5 steps with a railing? : A Little 6 Click Score: 18    End of Session Equipment Utilized During Treatment: Gait belt Activity Tolerance: Patient tolerated treatment well Patient left: in bed;with call bell/phone within reach Nurse Communication: Mobility status PT Visit Diagnosis: Unsteadiness on feet (R26.81);Muscle weakness (generalized) (M62.81)    Time: 6681-5947 PT Time Calculation (min) (ACUTE ONLY): 20 min   Charges:   PT Evaluation $PT Eval Moderate Complexity: 1 Mod          Lanney Gins, PT, DPT Supplemental Physical Therapist 08/31/18 4:36 PM Pager: [phone] Office: [phone]

## 2018-08-31 NOTE — Plan of Care (Signed)
  Problem: Activity: Goal: Capacity to carry out activities will improve Outcome: Progressing   Problem: Education: Goal: Ability to verbalize understanding of medication therapies will improve Outcome: Progressing   Problem: Education: Goal: Ability to demonstrate management of disease process will improve Outcome: Progressing   

## 2018-09-01 LAB — GLUCOSE, CAPILLARY: Glucose-Capillary: 105 mg/dL — ABNORMAL HIGH (ref 70–99)

## 2018-09-01 MED ORDER — PREDNISONE 20 MG PO TABS: 40.0000 mg | ORAL_TABLET | Freq: Every day | ORAL | 0 refills | Status: AC

## 2018-09-01 MED ORDER — FUROSEMIDE 40 MG PO TABS: 40.0000 mg | ORAL_TABLET | Freq: Every day | ORAL | 0 refills | Status: DC

## 2018-09-01 MED ORDER — METOPROLOL SUCCINATE ER 25 MG PO TB24: 25.0000 mg | ORAL_TABLET | Freq: Every day | ORAL | 0 refills | Status: DC

## 2018-09-01 MED ORDER — MOMETASONE FURO-FORMOTEROL FUM 100-5 MCG/ACT IN AERO: 2.0000 | INHALATION_SPRAY | Freq: Two times a day (BID) | RESPIRATORY_TRACT | 0 refills | Status: DC

## 2018-09-01 MED ORDER — PHENOL 1.4 % MT LIQD: 1.0000 | OROMUCOSAL | Status: DC | PRN

## 2018-09-01 MED ORDER — GUAIFENESIN ER 600 MG PO TB12: 1200.0000 mg | ORAL_TABLET | Freq: Two times a day (BID) | ORAL | 0 refills | Status: AC

## 2018-09-01 MED ORDER — FUROSEMIDE 40 MG PO TABS: 40.0000 mg | ORAL_TABLET | Freq: Two times a day (BID) | ORAL | 0 refills | Status: DC

## 2018-09-01 MED ORDER — ALBUTEROL SULFATE HFA 108 (90 BASE) MCG/ACT IN AERS: 2.0000 | INHALATION_SPRAY | RESPIRATORY_TRACT | 0 refills | Status: AC | PRN

## 2018-09-01 NOTE — Progress Notes (Signed)
Discharge to home, pt alert and oriented. D/c instructions and follow up appointments discussed with patient,verbalized understanding. HE teaching done. Awaiting for her ride home.

## 2018-09-01 NOTE — Consult Note (Addendum)
CARDIOLOGY CONSULT NOTE  Patient ID: Bridget Lamb MRN: [id number] DOB/AGE: 31-Dec-1955 62 y.o.  Admit date: 08/29/2018 Referring Physician  Aline August, MD Primary Physician:  [name], PA-C Reason for Consultation  CHF  HPI: Bridget Lamb  is a 62 y.o. female  With hypertension, hyperlipidemia, chronic diastolic heart failure, grade 2 diastolic dysfunction, morbid obesity, who had been feeling ill for the past 2 weeks with wheezing, cough that initially started as nonproductive then became productive cough, yellowish-green phlegm.  She was evaluated by her PCP and was started on antibiotics and prednisone, a BNP was obtained which was also markedly elevated.  Due to hypoxemia, she was sent to the emergency room where she was admitted for further management.  She is now being treated for acute exacerbation of bronchial asthma.  She is never a smoker.  She underwent echocardiogram which revealed possibility of constrictive pericarditis.  Very poor echo window and poor quality study.  I was consulted for the management of same.  Patient has started to feel better, she has diuresed close to 5 L since being in the hospital, shortness of breath has improved and leg edema has also improved significantly.  She is being discharged today.  Denies any chest pain.  No fever or chills.  Still has cough that is productive.  Past Medical History:  Diagnosis Date  . Allergic rhinitis   . Anemia    hematology, Dr. Julien Nordmann, last 01/2011  . CHF (congestive heart failure) ([place]) 2004  . Complication of anesthesia   . Dyslipidemia   . Dyspnea   . H/O echocardiogram 01/07/04   mild LVH, nl LV systolic function, EF 78%, left atrial enlargement, mildly thickened aortic and mitral valves  . History of thyroid cancer   . Hypertension   . Obesity   . Obesity, Class III, BMI 40-49.9 (morbid obesity) ([place]) 10/09/2013  . Osteopenia    BMD 02/27/08   . Osteopenia   . PONV (postoperative nausea and  vomiting)   . Umbilical hernia   . Unspecified essential hypertension 10/09/2013  . Unspecified hypothyroidism 10/09/2013     Past Surgical History:  Procedure Laterality Date  . BONE MARROW BIOPSY    . CARDIAC CATHETERIZATION  2005  . CHOLECYSTECTOMY  2003  . COLONOSCOPY  2008  . HERNIA REPAIR  2014   Ventral Hernia Repair  . THYROID SURGERY     partial thyroidectomy  . TOTAL THYROIDECTOMY  2009  . VENTRAL HERNIA REPAIR N/A 10/07/2013   Procedure: HERNIA REPAIR VENTRAL ADULT;  Surgeon: Gwenyth Ober, MD;  Location: [facility];  Service: General;  Laterality: N/A;     Family History  Problem Relation Age of Onset  . Cancer Mother   . Hypertension Mother   . Breast cancer Maternal Aunt      Social History: Social History   Socioeconomic History  . Marital status: Divorced    Spouse name: Not on file  . Number of children: Not on file  . Years of education: Not on file  . Highest education level: Not on file  Occupational History  . Not on file  Social Needs  . Financial resource strain: Not on file  . Food insecurity:    Worry: Not on file    Inability: Not on file  . Transportation needs:    Medical: Not on file    Non-medical: Not on file  Tobacco Use  . Smoking status: Never Smoker  . Smokeless tobacco: Never Used  Substance and Sexual Activity  . Alcohol use: No  . Drug use: No  . Sexual activity: Not on file  Lifestyle  . Physical activity:    Days per week: Not on file    Minutes per session: Not on file  . Stress: Not on file  Relationships  . Social connections:    Talks on phone: Not on file    Gets together: Not on file    Attends religious service: Not on file    Active member of club or organization: Not on file    Attends meetings of clubs or organizations: Not on file    Relationship status: Not on file  . Intimate partner violence:    Fear of current or ex partner: Not on file    Emotionally abused: Not on file    Physically abused: Not  on file    Forced sexual activity: Not on file  Other Topics Concern  . Not on file  Social History Narrative  . Not on file     Medications Prior to Admission  Medication Sig Dispense Refill Last Dose  . albuterol (PROVENTIL) (2.5 MG/3ML) 0.083% nebulizer solution Take 2.5 mg by nebulization every 6 (six) hours as needed for wheezing or shortness of breath.   unk  . aspirin 81 MG tablet Take 81 mg by mouth daily.   08/28/2018 at Unknown time  . azithromycin (ZITHROMAX) 250 MG tablet Take 250-500 mg by mouth See admin instructions. Take '500mg'$  (2 tablets) on day 1, then '250mg'$  (1 tablet) daily for 4 days.  0 08/28/2018 at Unknown time  . calcium citrate-vitamin D (CITRACAL+D) 315-200 MG-UNIT per tablet Take 1 tablet by mouth 2 (two) times daily.   08/28/2018 at Unknown time  . Fe Fum-FePoly-Vit C-Vit B3 (INTEGRA PO) Take 1 tablet by mouth daily.   08/28/2018 at Unknown time  . levothyroxine (SYNTHROID, LEVOTHROID) 150 MCG tablet Take 150 mcg by mouth daily before breakfast.    08/29/2018 at Unknown time  . lisinopril-hydrochlorothiazide (PRINZIDE,ZESTORETIC) 20-25 MG per tablet Take 1 tablet by mouth daily.   08/28/2018 at Unknown time  . loratadine (CLARITIN) 10 MG tablet Take 10 mg by mouth every morning.   08/28/2018 at Unknown time  . NIFEdipine (PROCARDIA-XL/ADALAT-CC/NIFEDICAL-XL) 30 MG 24 hr tablet Take 30 mg by mouth daily.   08/28/2018 at Unknown time  . olopatadine (PATANOL) 0.1 % ophthalmic solution Place 1 drop into both eyes 2 (two) times daily.   08/28/2018 at Unknown time  . [EXPIRED] predniSONE (DELTASONE) 50 MG tablet Take 50 mg by mouth daily.   08/29/2018 at Unknown time  . rosuvastatin (CRESTOR) 10 MG tablet Take 10 mg by mouth at bedtime.   08/28/2018 at Unknown time  . [DISCONTINUED] albuterol (PROVENTIL HFA;VENTOLIN HFA) 108 (90 BASE) MCG/ACT inhaler Inhale 1-2 puffs into the lungs every 6 (six) hours as needed for wheezing or shortness of breath.   Taking    Review of Systems   Constitutional: Positive for malaise/fatigue. Negative for chills and fever.  HENT: Negative.   Eyes: Negative.   Respiratory: Positive for cough, sputum production, shortness of breath and wheezing. Negative for hemoptysis.   Cardiovascular: Positive for orthopnea, leg swelling and PND. Negative for chest pain, palpitations and claudication.  Gastrointestinal: Negative.   Genitourinary: Negative.   Musculoskeletal: Negative.   Skin: Negative.   Neurological: Negative.   Endo/Heme/Allergies: Negative.   Psychiatric/Behavioral: Negative.   All other systems reviewed and are negative.   Physical Exam: Blood pressure Marland Kitchen)  155/82, pulse 87, temperature 98.4 F (36.9 C), temperature source Oral, resp. rate 18, height '5\' 4"'$  (1.626 m), weight 117.7 kg, last menstrual period 10/22/2011, SpO2 90 %.  Body mass index is 44.53 kg/m. Physical Exam  Constitutional: She is oriented to person, place, and time. She appears well-developed. No distress.  Morbidly obese  Eyes: Conjunctivae are normal.  Neck: Neck supple. No JVD present. No thyromegaly present.  Cardiovascular: Normal rate, regular rhythm, normal heart sounds and intact distal pulses. Exam reveals no friction rub.  No murmur heard. Pulmonary/Chest: Effort normal. No respiratory distress. She has wheezes in the right middle field and the right lower field.  Musculoskeletal: She exhibits edema (1 plus below knee edema).  Neurological: She is alert and oriented to person, place, and time.  Skin: Skin is warm and dry. She is not diaphoretic.  Psychiatric: She has a normal mood and affect.   Labs:   Lab Results  Component Value Date   WBC 8.8 08/31/2018   HGB 11.4 (L) 08/31/2018   HCT 38.9 08/31/2018   MCV 97.0 08/31/2018   PLT 213 08/31/2018    Recent Labs  Lab 08/31/18 0439  NA 138  K 4.7  CL 93*  CO2 38*  BUN 28*  CREATININE 1.09*  CALCIUM 9.4  GLUCOSE 121*    Lipid Panel     Component Value Date/Time   CHOL 190  01/31/2012 0909   TRIG 84 01/31/2012 0909   HDL 55 01/31/2012 0909   CHOLHDL 3.5 01/31/2012 0909   VLDL 17 01/31/2012 0909   LDLCALC 118 (H) 01/31/2012 0909    BNP (last 3 results) Recent Labs    08/29/18 1057  BNP 164.1*   BNP (last 3 results) Recent Labs    08/29/18 1057  BNP 164.1*     Cardiac Panel (last 3 results) Recent Labs    08/29/18 1057  TROPONINI <0.03    Lab Results  Component Value Date   TROPONINI <0.03 08/29/2018     Radiology: No results found.  Scheduled Meds: . aspirin  81 mg Oral Daily  . atorvastatin  20 mg Oral q1800  . enoxaparin (LOVENOX) injection  60 mg Subcutaneous Q24H  . furosemide  40 mg Intravenous BID  . guaiFENesin  600 mg Oral BID  . insulin aspart  0-20 Units Subcutaneous TID WC  . insulin aspart  0-5 Units Subcutaneous QHS  . ipratropium-albuterol  3 mL Nebulization BID  . levothyroxine  200 mcg Oral Q0600  . lisinopril  20 mg Oral Daily  . methylPREDNISolone (SOLU-MEDROL) injection  40 mg Intravenous Q8H  . mometasone-formoterol  2 puff Inhalation BID  . NIFEdipine  30 mg Oral Daily   Continuous Infusions: PRN Meds:.albuterol, benzonatate, phenol  CARDIAC STUDIES:  Coronary angiogram in 2005 revealed normal coronary arteries.  Normal LVEF.  Echocardiogram 08/29/2018 and repeat study 08/30/2018: Technically difficult study, Definity was used, no significant wall motion abnormality, EF 60 to 65%.  Mitral inflow variation was reduced to less than 25%, suggestive of restrictive physiology versus pericardial constriction.  No significant valvular abnormality.  IVC was dilated.  Trace pericardial effusion. Consider focused echocardiogram to evaluate for pericardial constriction with respirometer study. Can also consider cardiac MRI with free breathing sequences and IV gadolinium for pericardial enhancement. EKG 08/29/2018: Normal sinus rhythm with rate of 93 bpm, rightward axis, poor R wave progression, nonspecific ST  abnormality.  Compared to 10/07/2013, rightward axis is new otherwise no significant change.  Previously noted first-degree AV block  not present.  ASSESSMENT AND PLAN:  1.  Acute exacerbation of bronchial asthma, patient does have history of bronchial asthma and does have a nebulizer at home. 2.  Acute on chronic diastolic heart failure contributed by morbid obesity, acute respiratory infection, diabetes mellitus and hypertension. 3.  Diabetes mellitus type 2 controlled with stage III chronic kidney disease without hyperglycemia 4.  Hypertension 5.  Morbid obesity with serious comorbidity  Recommendation: Patient is now much improved with regard to diastolic heart failure.  I do not think she has constrictive pericarditis.  She has no significant JVD, she has no ascites although examination is limited, there is no disproportionate leg edema.  I suspect she has grade 2 diastolic dysfunction from above conditions. Blood pressure still high, in view of bronchial asthma, would recommend starting highly selective beta-blocker at a low dose in view of CHF as well.  Will start metoprolol succinate 25 mg daily.  Change to furosemide 40 mg p.o. daily upon discharge, I have had a lengthy discussion with the patient regarding risk of precipitation of CHF again over the next 4 to 6 weeks if she is not careful with salt intake.  I will follow her up in the outpatient basis in 2 weeks.  Although she has multiple cardiovascular risk factors, she has no known vascular disease, vascular examination is normal, remote coronary angiography was normal and nuclear stress test sometime in 2015 was also completely normal.  She can be taken off of aspirin.  Return to work note on 09/05/2018 given to the patient.   Adrian Prows, MD 09/01/2018, 9:36 AM [facility]. [place] Pager: [phone] Office: [phone] If no answer Cell [phone]

## 2018-09-01 NOTE — Discharge Summary (Signed)
Physician Discharge Summary  Bridget Lamb VQM:[id number] DOB: 1955/12/18 DOA: 08/29/2018  PCP: [name], PA-C  Admit date: 08/29/2018 Discharge date: 09/01/2018  Admitted From: Home Disposition: Home  Recommendations for Outpatient Follow-up:  1. Follow up with PCP in 1 week with repeat BMP 2. Follow-up with Dr. Einar Gip as an outpatient next and follow-up with the ED if symptoms worsen or new appear   Home Health: No Equipment/Devices: None  Discharge Condition: Stable CODE STATUS: Full Diet recommendation: Heart Healthy /fluid restriction of up to 1500 cc a day  Brief/Interim Summary: 62 year old female with history of obesity, essential hypertension, dyslipidemia, diastolic CHF presented with worsening shortness of breath and hypoxia.  She was admitted for probable COPD versus CHF exacerbation.  She was started on intravenous steroids and Lasix.  Her symptoms improved.  Currently on room air.  Cardiology evaluated the patient and will follow her up as an outpatient.  She will be discharged on oral steroids and oral Lasix.  Discharge Diagnoses:  Principal Problem:   Acute respiratory failure with hypoxia (HCC) Active Problems:   Essential hypertension, benign   Dyslipidemia   Hypothyroidism   Obesity, Class III, BMI 40-49.9 (morbid obesity) (HCC)   CHF (congestive heart failure) (HCC)   Iron deficiency anemia   Acute respiratory failure with hypoxemia (HCC)  Acute hypoxic respiratory failure probably from COPD versus CHF exacerbation -Patient required 4 L oxygen via nasal cannula at some point. -Respiratory status has improved.  Currently on room air.    COPD exacerbation -Improving.    Treated with intravenous Solu-Medrol and Dulera.  Will discharge home on oral prednisone 40 mg daily for 7 days.  Continue Dulera.  Continue as needed albuterol.  -Might need outpatient pulmonary evaluation  Acute on chronic diastolic CHF -Echo showed EF of 60 to 65% with some  pericardial constriction.   -Treated with intravenous Lasix and hydrochlorothiazide was discontinued.  Negative balance of 5716 cc since admission. -Cardiology evaluation appreciated.  Outpatient follow-up with cardiology -We will start the patient on metoprolol succinate 25 mg daily and change the Lasix to 40 mg daily as per cardiology recommendations.  Will discontinue aspirin as per cardiology recommendations.  Essential hypertension -Hydrochlorothiazide discontinued.  Will also discontinue lisinopril.  Continue with nifedipine.  Start metoprolol succinate 25 mg daily and Lasix 40 mg daily as per cardiology.  Outpatient follow-up  Hypothyroidism -Continue levothyroxine  Iron deficiency anemia -Continue home iron supplement  Dyslipidemia -Continue Lipitor  Morbid obesity -Outpatient follow-up -Patient might need outpatient sleep study to rule out sleep apnea.  Outpatient follow-up with primary care provider regarding the same.  Discharge Instructions  Discharge Instructions    (HEART FAILURE PATIENTS) Call MD:  Anytime you have any of the following symptoms: 1) 3 pound weight gain in 24 hours or 5 pounds in 1 week 2) shortness of breath, with or without a dry hacking cough 3) swelling in the hands, feet or stomach 4) if you have to sleep on extra pillows at night in order to breathe.   Complete by:  As directed    Call MD for:  difficulty breathing, headache or visual disturbances   Complete by:  As directed    Call MD for:  extreme fatigue   Complete by:  As directed    Call MD for:  hives   Complete by:  As directed    Call MD for:  persistant dizziness or light-headedness   Complete by:  As directed    Call MD  for:  persistant nausea and vomiting   Complete by:  As directed    Call MD for:  severe uncontrolled pain   Complete by:  As directed    Call MD for:  temperature >100.4   Complete by:  As directed    Diet - low sodium heart healthy   Complete by:  As  directed    Diet Carb Modified   Complete by:  As directed    Increase activity slowly   Complete by:  As directed      Allergies as of 09/01/2018   No Known Allergies     Medication List    STOP taking these medications   azithromycin 250 MG tablet Commonly known as:  ZITHROMAX   lisinopril-hydrochlorothiazide 20-25 MG tablet Commonly known as:  PRINZIDE,ZESTORETIC     TAKE these medications   albuterol (2.5 MG/3ML) 0.083% nebulizer solution Commonly known as:  PROVENTIL Take 2.5 mg by nebulization every 6 (six) hours as needed for wheezing or shortness of breath. What changed:  Another medication with the same name was changed. Make sure you understand how and when to take each.   albuterol 108 (90 Base) MCG/ACT inhaler Commonly known as:  PROVENTIL HFA;VENTOLIN HFA Inhale 2 puffs into the lungs every 4 (four) hours as needed for wheezing or shortness of breath. What changed:    how much to take  when to take this   aspirin 81 MG tablet Take 81 mg by mouth daily.   calcium citrate-vitamin D 315-200 MG-UNIT tablet Commonly known as:  CITRACAL+D Take 1 tablet by mouth 2 (two) times daily.   furosemide 40 MG tablet Commonly known as:  LASIX Take 1 tablet (40 mg total) by mouth daily.   guaiFENesin 600 MG 12 hr tablet Commonly known as:  MUCINEX Take 2 tablets (1,200 mg total) by mouth 2 (two) times daily for 10 days.   INTEGRA PO Take 1 tablet by mouth daily.   levothyroxine 150 MCG tablet Commonly known as:  SYNTHROID, LEVOTHROID Take 150 mcg by mouth daily before breakfast.   loratadine 10 MG tablet Commonly known as:  CLARITIN Take 10 mg by mouth every morning.   metoprolol succinate 25 MG 24 hr tablet Commonly known as:  TOPROL-XL Take 1 tablet (25 mg total) by mouth daily.   mometasone-formoterol 100-5 MCG/ACT Aero Commonly known as:  DULERA Inhale 2 puffs into the lungs 2 (two) times daily.   NIFEdipine 30 MG 24 hr tablet Commonly known as:   PROCARDIA-XL/NIFEDICAL-XL Take 30 mg by mouth daily.   olopatadine 0.1 % ophthalmic solution Commonly known as:  PATANOL Place 1 drop into both eyes 2 (two) times daily.   predniSONE 20 MG tablet Commonly known as:  DELTASONE Take 2 tablets (40 mg total) by mouth daily for 7 days. What changed:    medication strength  how much to take   rosuvastatin 10 MG tablet Commonly known as:  CRESTOR Take 10 mg by mouth at bedtime.      Follow-up Information    [name], PA-C. Go on 09/06/2018.   Specialty:  Physician Assistant Why:  with repeat bmp@10 :30am Contact information: [place] [id number] [phone]        Adrian Prows, MD. Go on 09/18/2018.   Specialty:  Cardiology Why:  @2 :30pm Contact information: [place] [address] [phone]          No Known Allergies  Consultations: Cardiology  Procedures/Studies: Dg Chest  2 View  Result Date: 08/29/2018 CLINICAL DATA:  Shortness of breath.  Respiratory illness. EXAM: CHEST - 2 VIEW COMPARISON:  11/16/2012 FINDINGS: Cardiac silhouette is prominent for size but probably accentuated by the lordotic view. Again noted are enlarged central vascular structures and cannot exclude vascular congestion or mild edema. There is some peribronchial cuffing or thickening. Patchy linear densities in the periphery of the mid left lung. Stable mild elevation of the right hemidiaphragm. Surgical clips in the lower neck region. No large pleural effusions. IMPRESSION: 1. Enlarged vascular structures with peribronchial thickening. Findings are suggestive for vascular congestion and mild pulmonary edema. 2. Streaky densities in the mid left lung are nonspecific but could be related to atelectasis. Consider short-term follow-up to see if this area resolves. 3. Mild cardiomegaly. Electronically Signed   By: Markus Daft M.D.   On: 08/29/2018 11:55    Echo Study Conclusions  - Left  ventricle: The cavity size was normal. Systolic function was normal. The estimated ejection fraction was in the range of 60% to 65%. Images were inadequate for LV wall motion assessment. Abnormal septal shudder as well as respiratory related leftward septal shift. Doppler parameters are consistent with high ventricular filling pressure (E/e&' = 23). - Aortic valve: There was no stenosis. There was no regurgitation. Mean gradient (S): 6 mm Hg. Valve area (VTI): 1.89 cm^2. Valve area (Vmax): 1.77 cm^2. Valve area (Vmean): 1.63 cm^2. - Mitral valve: Calcified annulus. Transvalvular velocity was within the normal range. There was no evidence for stenosis. There was mild regurgitation. - Left atrium: The atrium was moderately to severely dilated. - Right ventricle: The cavity size was normal. Wall thickness was normal. Systolic function was normal. - Right atrium: The atrium was mildly dilated. - Tricuspid valve: There was trivial regurgitation. - Inferior vena cava: The vessel was dilated. The respirophasic diameter changes were blunted (<50%), consistent with elevated central venous pressure. - Pericardium, extracardiac: A trivial pericardial effusion was identified anterior to the heart. - Increased flow velocities, may be secondary to anemia, thyrotoxicosis, or high flow state.  Impressions:  - Normal left ventricular function, EF 60-65%. Unable to assess wall motion due to image quality (See Recommendations). Respiratory related leftward septal shift. High left ventricular filling pressure. Normal right ventricular size and function. Mild mitral regurgitation. Moderate-severe LA enlargement. RA pressure estimate 15 mmHg. Trace anterior pericardial effusion.  Recommendations:  1. Consider focused echocardiogram to evaluate for pericardial constriction with respirometer study. Can also consider cardiac MRI with free breathing  sequences and IV gadolinium for pericardial enhancement. 2. Consider focused echocardiogram with Definity for left ventricular enhancement, since wall motion was not able to be determined on today&'s exam.  Limited echo on 08/30/2018 Impressions:  - This study was difficult, not sure how much it added compared to   the complete study from prior. There was some respirophasic   variation of the mitral inflow E velocity but < 25%. The Definity   images were not ideal, no wall motion abnormalities were noted   and EF estimated 60-65%. There was subtle respirophasic septal   bounce, but again not particularly well imaged. Would recommend   cardiac MRI with free breathing sequences to better assess for   evidence of pericardial contriction +/- hemodynamic cath   Subjective: Patient seen and examined at bedside.  She feels much better and wants to go home.  No overnight fever, nausea, vomiting, shortness of breath or chest pain.  Discharge Exam: Vitals:   09/01/18 2440 09/01/18 1027  BP:  (!) 155/82  Pulse:    Resp:    Temp:    SpO2: 90%    Vitals:   09/01/18 0100 09/01/18 0455 09/01/18 0806 09/01/18 0842  BP: (!) 150/79 (!) 152/73  (!) 155/82  Pulse: 77 87    Resp: 18 18    Temp: 98.6 F (37 C) 98.4 F (36.9 C)    TempSrc: Oral Oral    SpO2: 96% 91% 90%   Weight:  117.7 kg    Height:        General: Pt is alert, awake, not in acute distress Cardiovascular: rate controlled, S1/S2 + Respiratory: bilateral decreased breath sounds at bases with some scattered crackles Abdominal: Soft, NT, ND, bowel sounds + Extremities: 1+ edema, no cyanosis    The results of significant diagnostics from this hospitalization (including imaging, microbiology, ancillary and laboratory) are listed below for reference.     Microbiology: No results found for this or any previous visit (from the past 240 hour(s)).   Labs: BNP (last 3 results) Recent Labs    08/29/18 1057  BNP  824.2*   Basic Metabolic Panel: Recent Labs  Lab 08/29/18 1057 08/30/18 0452 08/31/18 0439  NA 135 137 138  K 3.7 4.2 4.7  CL 97* 94* 93*  CO2 29 36* 38*  GLUCOSE 125* 124* 121*  BUN 11 21 28*  CREATININE 0.88 1.08* 1.09*  CALCIUM 9.0 9.0 9.4   Liver Function Tests: No results for input(s): AST, ALT, ALKPHOS, BILITOT, PROT, ALBUMIN in the last 168 hours. No results for input(s): LIPASE, AMYLASE in the last 168 hours. No results for input(s): AMMONIA in the last 168 hours. CBC: Recent Labs  Lab 08/29/18 1057 08/30/18 0452 08/31/18 0439  WBC 7.0 7.9 8.8  HGB 11.3* 11.2* 11.4*  HCT 37.7 37.8 38.9  MCV 97.4 97.9 97.0  PLT 170 202 213   Cardiac Enzymes: Recent Labs  Lab 08/29/18 1057  TROPONINI <0.03   BNP: Invalid input(s): POCBNP CBG: Recent Labs  Lab 08/31/18 0749 08/31/18 1139 08/31/18 1651 08/31/18 2103 09/01/18 0750  GLUCAP 108* 133* 117* 113* 105*   D-Dimer No results for input(s): DDIMER in the last 72 hours. Hgb A1c No results for input(s): HGBA1C in the last 72 hours. Lipid Profile No results for input(s): CHOL, HDL, LDLCALC, TRIG, CHOLHDL, LDLDIRECT in the last 72 hours. Thyroid function studies No results for input(s): TSH, T4TOTAL, T3FREE, THYROIDAB in the last 72 hours.  Invalid input(s): FREET3 Anemia work up No results for input(s): VITAMINB12, FOLATE, FERRITIN, TIBC, IRON, RETICCTPCT in the last 72 hours. Urinalysis    Component Value Date/Time   COLORURINE AMBER (A) 10/06/2013 1752   APPEARANCEUR CLOUDY (A) 10/06/2013 1752   LABSPEC 1.024 10/06/2013 1752   PHURINE 5.5 10/06/2013 1752   GLUCOSEU NEGATIVE 10/06/2013 1752   HGBUR NEGATIVE 10/06/2013 1752   BILIRUBINUR SMALL (A) 10/06/2013 1752   KETONESUR NEGATIVE 10/06/2013 1752   PROTEINUR 30 (A) 10/06/2013 1752   UROBILINOGEN 1.0 10/06/2013 1752   NITRITE NEGATIVE 10/06/2013 1752   LEUKOCYTESUR MODERATE (A) 10/06/2013 1752   Sepsis Labs Invalid input(s): PROCALCITONIN,  WBC,   LACTICIDVEN Microbiology No results found for this or any previous visit (from the past 240 hour(s)).   Time coordinating discharge: 35 minutes  SIGNED:   Aline August, MD  [facility] 09/01/2018, 10:16 AM Pager: [phone]  If 7PM-7AM, please contact night-coverage [email or web address] Password [email or web address]

## 2018-10-02 ENCOUNTER — Ambulatory Visit (INDEPENDENT_AMBULATORY_CARE_PROVIDER_SITE_OTHER): Payer: BLUE CROSS/BLUE SHIELD | Admitting: Pulmonary Disease

## 2018-10-02 ENCOUNTER — Encounter: Payer: Self-pay | Admitting: Pulmonary Disease

## 2018-10-02 VITALS — BP 106/70 | HR 86 | Ht 64.0 in | Wt 259.0 lb

## 2018-10-02 DIAGNOSIS — R0683 Snoring: Secondary | ICD-10-CM | POA: Diagnosis not present

## 2018-10-02 NOTE — Progress Notes (Signed)
Bridget Lamb    [id number]    1956-07-13  Primary Care Physician:Spencer, Domingo Pulse, PA-C  Referring Physician: [name], PA-C [place], [place] [address]  Chief complaint:   Patient being seen for history of snoring, possible obstructive sleep apnea  HPI:  History of snoring Usually goes to bed between 10 and midnight Able to fall asleep quickly most days Wakes up about 4-5 times during the night Final wake up time about 5:30 AM About 20 pound weight gain in the last couple years  She was recently hospitalized for decompensated congestive heart failure Heart failure history about 8 years  Admits to snoring, admits to waking herself snoring sometimes No history of gasping respirations at night No night sweats Memory is good Occasional headaches Sometimes with dryness of the mouth in the mornings  Outpatient Encounter Medications as of 10/02/2018  Medication Sig  . albuterol (PROVENTIL HFA;VENTOLIN HFA) 108 (90 Base) MCG/ACT inhaler Inhale 2 puffs into the lungs every 4 (four) hours as needed for wheezing or shortness of breath.  Marland Kitchen albuterol (PROVENTIL) (2.5 MG/3ML) 0.083% nebulizer solution Take 2.5 mg by nebulization every 6 (six) hours as needed for wheezing or shortness of breath.  . calcium citrate-vitamin D (CITRACAL+D) 315-200 MG-UNIT per tablet Take 1 tablet by mouth 2 (two) times daily.  . Fe Fum-FePoly-Vit C-Vit B3 (INTEGRA PO) Take 1 tablet by mouth daily.  Marland Kitchen levothyroxine (SYNTHROID, LEVOTHROID) 150 MCG tablet Take 150 mcg by mouth daily before breakfast.   . loratadine (CLARITIN) 10 MG tablet Take 10 mg by mouth every morning.  . metoprolol succinate (TOPROL-XL) 25 MG 24 hr tablet Take 1 tablet (25 mg total) by mouth daily.  . mometasone-formoterol (DULERA) 100-5 MCG/ACT AERO Inhale 2 puffs into the lungs 2 (two) times daily.  Marland Kitchen NIFEdipine (PROCARDIA-XL/ADALAT-CC/NIFEDICAL-XL) 30 MG 24 hr tablet Take 30 mg by mouth daily.  Marland Kitchen  olopatadine (PATANOL) 0.1 % ophthalmic solution Place 1 drop into both eyes 2 (two) times daily.  . rosuvastatin (CRESTOR) 10 MG tablet Take 10 mg by mouth at bedtime.  . furosemide (LASIX) 40 MG tablet Take 1 tablet (40 mg total) by mouth daily.   No facility-administered encounter medications on file as of 10/02/2018.     Allergies as of 10/02/2018  . (No Known Allergies)    Past Medical History:  Diagnosis Date  . Allergic rhinitis   . Anemia    hematology, Dr. Julien Nordmann, last 01/2011  . CHF (congestive heart failure) ([facility]) 2004  . Complication of anesthesia   . Dyslipidemia   . Dyspnea   . H/O echocardiogram 01/07/04   mild LVH, nl LV systolic function, EF 93%, left atrial enlargement, mildly thickened aortic and mitral valves  . History of thyroid cancer   . Hypertension   . Obesity   . Obesity, Class III, BMI 40-49.9 (morbid obesity) ([facility]) 10/09/2013  . Osteopenia    BMD 02/27/08   . Osteopenia   . PONV (postoperative nausea and vomiting)   . Umbilical hernia   . Unspecified essential hypertension 10/09/2013  . Unspecified hypothyroidism 10/09/2013    Past Surgical History:  Procedure Laterality Date  . BONE MARROW BIOPSY    . CARDIAC CATHETERIZATION  2005  . CHOLECYSTECTOMY  2003  . COLONOSCOPY  2008  . HERNIA REPAIR  2014   Ventral Hernia Repair  . THYROID SURGERY     partial thyroidectomy  . TOTAL THYROIDECTOMY  2009  .  VENTRAL HERNIA REPAIR N/A 10/07/2013   Procedure: HERNIA REPAIR VENTRAL ADULT;  Surgeon: Gwenyth Ober, MD;  Location: [facility];  Service: General;  Laterality: N/A;    Family History  Problem Relation Age of Onset  . Cancer Mother   . Hypertension Mother   . Breast cancer Maternal Aunt     Social History   Socioeconomic History  . Marital status: Divorced    Spouse name: Not on file  . Number of children: Not on file  . Years of education: Not on file  . Highest education level: Not on file  Occupational History  . Not on file  Social  Needs  . Financial resource strain: Not on file  . Food insecurity:    Worry: Not on file    Inability: Not on file  . Transportation needs:    Medical: Not on file    Non-medical: Not on file  Tobacco Use  . Smoking status: Never Smoker  . Smokeless tobacco: Never Used  Substance and Sexual Activity  . Alcohol use: No  . Drug use: No  . Sexual activity: Not on file  Lifestyle  . Physical activity:    Days per week: Not on file    Minutes per session: Not on file  . Stress: Not on file  Relationships  . Social connections:    Talks on phone: Not on file    Gets together: Not on file    Attends religious service: Not on file    Active member of club or organization: Not on file    Attends meetings of clubs or organizations: Not on file    Relationship status: Not on file  . Intimate partner violence:    Fear of current or ex partner: Not on file    Emotionally abused: Not on file    Physically abused: Not on file    Forced sexual activity: Not on file  Other Topics Concern  . Not on file  Social History Narrative  . Not on file    Review of Systems  Constitutional: Negative.   HENT: Negative.   Eyes: Negative.   Respiratory: Negative.   Cardiovascular: Positive for leg swelling.  Endocrine: Negative.   Psychiatric/Behavioral: Positive for sleep disturbance.    Vitals:   10/02/18 1451  BP: 106/70  Pulse: 86  SpO2: 95%     Physical Exam  Constitutional: She appears well-developed and well-nourished.  Obese  HENT:  Head: Normocephalic and atraumatic.  Mallampati 3  Eyes: Pupils are equal, round, and reactive to light. EOM are normal. Right eye exhibits no discharge. Left eye exhibits no discharge.  Neck: Normal range of motion. Neck supple. No tracheal deviation present. No thyromegaly present.  Cardiovascular: Normal rate and regular rhythm.  Pulmonary/Chest: Breath sounds normal. No respiratory distress. She has no wheezes.  Abdominal: Soft. Bowel  sounds are normal. She exhibits no distension. There is no tenderness.   Epworth Sleepiness Scale of 8  Assessment:  Moderate to high probability of significant sleep disordered breathing  Concern for central sleep apnea with a history of decompensated congestive heart failure recently   Plan/Recommendations: We will schedule the patient for an in lab polysomnogram  Pathophysiology of sleep disordered breathing discussed with patient  Treatment options discussed with patient  Importance of weight loss and exercise discussed with the patient  I will see her back in the office in about 3 months Encouraged to call with any significant concerns   Sherrilyn Rist MD  [facility] 10/02/2018, 3:32 PM  CC: [name], PA-C

## 2018-10-02 NOTE — Patient Instructions (Addendum)
Moderate probability of significant sleep disordered breathing Possibility of central sleep apnea based on a history of congestive heart failure  We will schedule you for an in lab polysomnogram We will order a split-night study  I will see you back in the office in about 3 months Continue weight loss efforts  Call with significant concerns  You may resume taking your aspirin   Sleep Apnea Sleep apnea is a condition in which breathing pauses or becomes shallow during sleep. Episodes of sleep apnea usually last 10 seconds or longer, and they may occur as many as 20 times an hour. Sleep apnea disrupts your sleep and keeps your body from getting the rest that it needs. This condition can increase your risk of certain health problems, including:  Heart attack.  Stroke.  Obesity.  Diabetes.  Heart failure.  Irregular heartbeat.  There are three kinds of sleep apnea:  Obstructive sleep apnea. This kind is caused by a blocked or collapsed airway.  Central sleep apnea. This kind happens when the part of the brain that controls breathing does not send the correct signals to the muscles that control breathing.  Mixed sleep apnea. This is a combination of obstructive and central sleep apnea.  What are the causes? The most common cause of this condition is a collapsed or blocked airway. An airway can collapse or become blocked if:  Your throat muscles are abnormally relaxed.  Your tongue and tonsils are larger than normal.  You are overweight.  Your airway is smaller than normal.  What increases the risk? This condition is more likely to develop in people who:  Are overweight.  Smoke.  Have a smaller than normal airway.  Are elderly.  Are female.  Drink alcohol.  Take sedatives or tranquilizers.  Have a family history of sleep apnea.  What are the signs or symptoms? Symptoms of this condition include:  Trouble staying asleep.  Daytime sleepiness and  tiredness.  Irritability.  Loud snoring.  Morning headaches.  Trouble concentrating.  Forgetfulness.  Decreased interest in sex.  Unexplained sleepiness.  Mood swings.  Personality changes.  Feelings of depression.  Waking up often during the night to urinate.  Dry mouth.  Sore throat.  How is this diagnosed? This condition may be diagnosed with:  A medical history.  A physical exam.  A series of tests that are done while you are sleeping (sleep study). These tests are usually done in a sleep lab, but they may also be done at home.  How is this treated? Treatment for this condition aims to restore normal breathing and to ease symptoms during sleep. It may involve managing health issues that can affect breathing, such as high blood pressure or obesity. Treatment may include:  Sleeping on your side.  Using a decongestant if you have nasal congestion.  Avoiding the use of depressants, including alcohol, sedatives, and narcotics.  Losing weight if you are overweight.  Making changes to your diet.  Quitting smoking.  Using a device to open your airway while you sleep, such as: ? An oral appliance. This is a custom-made mouthpiece that shifts your lower jaw forward. ? A continuous positive airway pressure (CPAP) device. This device delivers oxygen to your airway through a mask. ? A nasal expiratory positive airway pressure (EPAP) device. This device has valves that you put into each nostril. ? A bi-level positive airway pressure (BPAP) device. This device delivers oxygen to your airway through a mask.  Surgery if other treatments do  not work. During surgery, excess tissue is removed to create a wider airway.  It is important to get treatment for sleep apnea. Without treatment, this condition can lead to:  High blood pressure.  Coronary artery disease.  (Men) An inability to achieve or maintain an erection (impotence).  Reduced thinking  abilities.  Follow these instructions at home:  Make any lifestyle changes that your health care provider recommends.  Eat a healthy, well-balanced diet.  Take over-the-counter and prescription medicines only as told by your health care provider.  Avoid using depressants, including alcohol, sedatives, and narcotics.  Take steps to lose weight if you are overweight.  If you were given a device to open your airway while you sleep, use it only as told by your health care provider.  Do not use any tobacco products, such as cigarettes, chewing tobacco, and e-cigarettes. If you need help quitting, ask your health care provider.  Keep all follow-up visits as told by your health care provider. This is important. Contact a health care provider if:  The device that you received to open your airway during sleep is uncomfortable or does not seem to be working.  Your symptoms do not improve.  Your symptoms get worse. Get help right away if:  You develop chest pain.  You develop shortness of breath.  You develop discomfort in your back, arms, or stomach.  You have trouble speaking.  You have weakness on one side of your body.  You have drooping in your face. These symptoms may represent a serious problem that is an emergency. Do not wait to see if the symptoms will go away. Get medical help right away. Call your local emergency services (911 in the [place] Do not drive yourself to the hospital. This information is not intended to replace advice given to you by your health care provider. Make sure you discuss any questions you have with your health care provider. Document Released: 10/01/2002 Document Revised: 06/06/2016 Document Reviewed: 07/21/2015 Elsevier Interactive Patient Education  [employer].

## 2018-11-01 ENCOUNTER — Ambulatory Visit (HOSPITAL_BASED_OUTPATIENT_CLINIC_OR_DEPARTMENT_OTHER): Payer: BLUE CROSS/BLUE SHIELD | Attending: Pulmonary Disease | Admitting: Pulmonary Disease

## 2018-11-01 VITALS — Ht 64.0 in | Wt 256.0 lb

## 2018-11-01 DIAGNOSIS — R0683 Snoring: Secondary | ICD-10-CM | POA: Diagnosis present

## 2018-11-09 ENCOUNTER — Telehealth: Payer: Self-pay | Admitting: Pulmonary Disease

## 2018-11-09 DIAGNOSIS — G4733 Obstructive sleep apnea (adult) (pediatric): Secondary | ICD-10-CM

## 2018-11-09 NOTE — Telephone Encounter (Signed)
Sleep study result  Date of study 11/01/2018  Study is positive for severe obstructive sleep apnea  DME referral for CPAP  CPAP therapy on 13 cm H2O with a Small size Resmed Full Face Mask AirFit 20 for Her mask and heated humidification  Weight loss measures Clinical follow-up

## 2018-11-09 NOTE — Procedures (Signed)
POLYSOMNOGRAPHY  Last, First: Bridget, Lamb MRN: [id number] Gender: Female Age (years): 63 Weight (lbs): 256 DOB: 1956/04/02 BMI: 44 Primary Care: No PCP Epworth Score: 10 Referring: Laurin Coder MD Technician: Jorge Ny Interpreting: Laurin Coder MD Study Type: Split Night CPAP Ordered Study Type: Split Night CPAP Study date: 11/01/2018 Location: [place] CLINICAL INFORMATION Bridget Lamb is a 63 year old Female and was referred to the sleep center for evaluation of G47.80 Other Sleep Disorders. Indications include Congestive Heart Failure, Hypertension, Snoring.  MEDICATIONS Patient self administered medications include: N/A. Medications administered during study include No sleep medicine administered.  SLEEP STUDY TECHNIQUE The patient underwent an attended overnight level one polysomnography titration to assess the effects of CPAP therapy. The following variables were monitored: EEG (C4-A1, C3-A2, O1-A2, O2-A1), EOG, submental and leg EMG, ECG, oxyhemoglobin saturation by pulse oximetry, thoracic and abdominal respiratory effort belts, nasal/oral airflow by pressure sensor, body position sensor and snoring sensor. CPAP pressure was titrated to eliminate apneas, hypopneas and oxygen desaturation. Hypopneas were scored per AASM definition IB (4% desaturation)  The NPSG portion of the study ended at 1:23:12 AM . The CPAP titration was initiated at 1:32:47 AM AM with the CPAP portion of the study ending at 5:16:01 AM.  TECHNICIAN COMMENTS Comments added by Technician: NONE Comments added by Scorer: N/A SLEEP ARCHITECTURE The recording time for the entire night was 381.4 minutes. The diagnostic portion was initiated at 10:54:37 PM and terminated at 1:23:12 AM. The time in bed was 148.6 minutes. EEG confirmed total sleep time was 79.5 minutes yielding a sleep efficiency of 53.5%%. Sleep onset after lights out was 34.6 minutes with a REM latency of N/A  minutes. The patient spent 16.4%% of the night in stage N1 sleep, 83.6%% in stage N2 sleep, 0.0%% in stage N3 and 0% in REM. The Arousal Index was 80.0/hour.  The titration portion was initiated at 1:32:47 AM and terminated at 5:16:01 AM. The time in bed was 223.2 minutes. EEG confirmed total sleep time was 210.0 minutes yielding a sleep efficiency of 94.1%%. Sleep onset after CPAP initiation was 6.0 minutes with a REM latency of 36.0 minutes. The patient spent 3.1%% of the night in stage N1 sleep, 67.1%% in stage N2 sleep, 0.0%% in stage N3 and 29.8% in REM. The Arousal Index was 10.0/hour. RESPIRATORY PARAMETERS During the diagnostic portion, there were a total of 128 respiratory disturbances recorded; 21 apneas ( 21 obstructive, 0 mixed, 0 central), 94 hypopneas and 13 RERAs. The apnea/hypopnea index 86.8 was events/hour and the RDI was 96.7 events/hour. The central sleep apnea index was 0.0 events/hour. The REM AHI was N/A /h and NREM AHI was 86.8/h. The REM RDI was 0.0 /h and NREM RDI was 96.7 /h. The supine AHI was 113.5/h, and the non supine AHI was 58.8/h; supine during 51.2%% of sleep. The supine RDI was 125.3/h, and the non supine RDI was 66.56/h. Respiratory disturbances were associated with oxygen desaturation down to a nadir of 79.0 % during sleep. The mean oxygen saturation during the study was 92.0%. The cumulative time under 88% oxygen saturation was 8.4 minutes.  During the titration portion, the apnea/hypopnea index (AHI) was 16.9 events/hour and the RDI was 16.9 events/hour. The central sleep apnea index was events/hour. The most appropriate setting of CPAP was IPAP/EPAP 13/13 cm H2O. At this setting, the sleep efficiency was 99% and the patient was supine for 0%. The AHI was 0.7 events per hour(with 0 central events). Oxygen nadir was 71.0. LEG  MOVEMENT DATA The periodic limb movement index was 0.0/hour with an associated arousal index of /hour. CARDIAC DATA The underlying cardiac  rhythm was most consistent with sinus rhythm. Mean heart rate was 67.4 during diagnostic portion and 64.9 during titration portion of study. Additional rhythm abnormalities include None.   IMPRESSIONS - Severe Obstructive Sleep apnea(OSA) Optimal pressure attained. - EKG showed no cardiac abnormalities. - No Significant Central Sleep Apnea (CSA) - Severe Oxygen Desaturation - The patient snored with loud snoring volume. - EEG did not show alpha intrusion. - No significant periodic leg movements(PLMs) during sleep.  [place] reduced sleep efficiency, long primary sleep latency, no REM sleep latency and no slow wave latency.   DIAGNOSIS - Obstructive Sleep Apnea (327.23 [G47.33 ICD-10]) - Nocturnal Hypoxemia (327.26 [G47.36 ICD-10])   RECOMMENDATIONS - Trial of CPAP therapy on 13 cm H2O with a Small size Resmed Full Face Mask AirFit 20 for Her mask and heated humidification. - Avoid alcohol, sedatives and other CNS depressants that may worsen sleep apnea and disrupt normal sleep architecture. - Sleep hygiene should be reviewed to assess factors that may improve sleep quality. - Weight management and regular exercise should be initiated or continued. - Return to Sleep Center for re-evaluation after 4 weeks of therapy  [Electronically signed] 11/09/2018 05:59 AM  Sherrilyn Rist MD NPI: [id number]

## 2018-11-10 NOTE — Telephone Encounter (Signed)
lmom to call back 

## 2018-11-13 NOTE — Telephone Encounter (Signed)
Patient returned call, [phone].

## 2018-11-13 NOTE — Telephone Encounter (Signed)
Spoke with pt and notified of results per Dr. Ander Slade. Pt verbalized understanding and denied any questions.  Order sent to [facility] and 2 month ov scheduled. She will call back to push this appt out if they do not get her machine to her soon.

## 2018-12-20 ENCOUNTER — Ambulatory Visit (INDEPENDENT_AMBULATORY_CARE_PROVIDER_SITE_OTHER): Payer: BLUE CROSS/BLUE SHIELD | Admitting: Cardiology

## 2018-12-20 ENCOUNTER — Encounter: Payer: Self-pay | Admitting: Cardiology

## 2018-12-20 VITALS — BP 102/52 | HR 73 | Ht 64.0 in | Wt 262.7 lb

## 2018-12-20 DIAGNOSIS — I10 Essential (primary) hypertension: Secondary | ICD-10-CM

## 2018-12-20 DIAGNOSIS — R600 Localized edema: Secondary | ICD-10-CM | POA: Insufficient documentation

## 2018-12-20 DIAGNOSIS — I5032 Chronic diastolic (congestive) heart failure: Secondary | ICD-10-CM | POA: Insufficient documentation

## 2018-12-20 MED ORDER — SPIRONOLACTONE 25 MG PO TABS: 25.0000 mg | ORAL_TABLET | Freq: Every day | ORAL | 2 refills | Status: DC

## 2018-12-20 NOTE — Progress Notes (Signed)
Subjective:   Bridget Lamb, female    DOB: 12-16-1955, 63 y.o.   MRN: [id number]  [name], PA-C:  Chief Complaint  Patient presents with  . Congestive Heart Failure  . Follow-up    75mh, last EKG 09/18/18    HPI: Bridget Lamb  is a 63y.o. female  with morbid obesity BMI 44, hypertension, hyperlipidemia, HFpEF, here for 3 month follow up.  Patient was last seen for  There was concern raised regarding possible constrictive pericarditis. However, clinical suspicion for constrictive pericarditis was low. Thus, she was treated as heart failure exacerbation, as well as acute exacerbation of acute asthma. Furosemide was changed 40 mg p.o. daily on discharge, metoprolol succinate 25 mg daily was added. She was counseled regarding diet and lifestyle modifications.  Reports that her weight has been fluctuating. Shortness of breath has improved. Has leg edema intermittently, does not use support stockings regularly. She continues to eat/drink canned coups every now and then.  Patient underwent sleep study that was positive for sleep apnea and was encouraged to use CPAP; however, she does not feel that she wants to use this.    Past Medical History:  Diagnosis Date  . Allergic rhinitis   . Anemia    hematology, Dr. MJulien Nordmann last 01/2011  . [place]ive heart [place]  . Complication of anesthesia   . Dyslipidemia   . Dyspnea   . H/O echocardiogram 01/07/04   mild LVH, nl LV systolic function, EF 664% left atrial enlargement, mildly thickened aortic and mitral valves  . History of thyroid cancer   . Hypertension   . Obesity   . Obesity, Class III, BMI 40-49.9 (morbid obesity) ([place] 10/09/2013  . Osteopenia    BMD 02/27/08   . Osteopenia   . PONV (postoperative nausea and vomiting)   . Umbilical hernia   . Unspecified essential hypertension 10/09/2013  . Unspecified hypothyroidism 10/09/2013    Past Surgical History:  Procedure Laterality  Date  . BONE MARROW BIOPSY    . CARDIAC CATHETERIZATION  2005  . CHOLECYSTECTOMY  2003  . COLONOSCOPY  2008  . HERNIA REPAIR  2014   Ventral Hernia Repair  . THYROID SURGERY     partial thyroidectomy  . TOTAL THYROIDECTOMY  2009  . VENTRAL HERNIA REPAIR N/A 10/07/2013   Procedure: HERNIA REPAIR VENTRAL ADULT;  Surgeon: JGwenyth Ober MD;  [place]  Service: General;  Laterality: N/A;    Family History  Problem Relation Age of Onset  . Cancer Mother   . Hypertension Mother   . Breast cancer Maternal Aunt     Social History   Socioeconomic History  . Marital status: Divorced    Spouse name: Not on file  . Number of children: Not on file  . Years of education: Not on file  . Highest education level: Not on file  Occupational History  . Not on file  Social Needs  . Financial resource strain: Not on file  . Food insecurity:    Worry: Not on file    Inability: Not on file  . Transportation needs:    Medical: Not on file    Non-medical: Not on file  Tobacco Use  . Smoking status: Never Smoker  . Smokeless tobacco: Never Used  Substance and Sexual Activity  . Alcohol use: No  . Drug use: No  . Sexual activity: Not on file  Lifestyle  . Physical activity:  Days per week: Not on file    Minutes per session: Not on file  . Stress: Not on file  Relationships  . Social connections:    Talks on phone: Not on file    Gets together: Not on file    Attends religious service: Not on file    Active member of club or organization: Not on file    Attends meetings of clubs or organizations: Not on file    Relationship status: Not on file  . Intimate partner violence:    Fear of current or ex partner: Not on file    Emotionally abused: Not on file    Physically abused: Not on file    Forced sexual activity: Not on file  Other Topics Concern  . Not on file  Social History Narrative  . Not on file    Current Meds  Medication Sig  . albuterol (PROVENTIL  HFA;VENTOLIN HFA) 108 (90 Base) MCG/ACT inhaler Inhale 2 puffs into the lungs every 4 (four) hours as needed for wheezing or shortness of breath.  Marland Kitchen albuterol (PROVENTIL) (2.5 MG/3ML) 0.083% nebulizer solution Take 2.5 mg by nebulization every 6 (six) hours as needed for wheezing or shortness of breath.  Marland Kitchen aspirin 81 MG chewable tablet Chew 81 mg by mouth daily.  . calcium citrate-vitamin D (CITRACAL+D) 315-200 MG-UNIT per tablet Take 1 tablet by mouth 2 (two) times daily.  . Fe Fum-FePoly-Vit C-Vit B3 (INTEGRA PO) Take 1 tablet by mouth daily.  . furosemide (LASIX) 40 MG tablet Take 1 tablet (40 mg total) by mouth daily. (Patient taking differently: Take 40 mg by mouth 2 (two) times daily. )  . levothyroxine (SYNTHROID, LEVOTHROID) 150 MCG tablet Take 150 mcg by mouth daily before breakfast.   . lisinopril (PRINIVIL,ZESTRIL) 20 MG tablet Take 20 mg by mouth daily.  Marland Kitchen loratadine (CLARITIN) 10 MG tablet Take 10 mg by mouth every morning.  . metoprolol succinate (TOPROL-XL) 25 MG 24 hr tablet Take 1 tablet (25 mg total) by mouth daily.  . mometasone-formoterol (DULERA) 100-5 MCG/ACT AERO Inhale 2 puffs into the lungs 2 (two) times daily.  Marland Kitchen NIFEdipine (PROCARDIA-XL/ADALAT-CC/NIFEDICAL-XL) 30 MG 24 hr tablet Take 30 mg by mouth daily.  Marland Kitchen olopatadine (PATANOL) 0.1 % ophthalmic solution Place 1 drop into both eyes 2 (two) times daily.  . Potassium 75 MG TABS Take 75 mg by mouth daily.  . rosuvastatin (CRESTOR) 10 MG tablet Take 10 mg by mouth at bedtime.     Review of Systems  Constitution: Negative for decreased appetite, malaise/fatigue, weight gain and weight loss.  Eyes: Negative for visual disturbance.  Cardiovascular: Positive for dyspnea on exertion and leg swelling. Negative for chest pain, claudication, orthopnea, palpitations and syncope.  Respiratory: Negative for hemoptysis and wheezing.   Endocrine: Negative for cold intolerance and heat intolerance.  Hematologic/Lymphatic: Does not  bruise/bleed easily.  Skin: Negative for nail changes.  Musculoskeletal: Negative for muscle weakness and myalgias.  Gastrointestinal: Negative for abdominal pain, change in bowel habit, nausea and vomiting.  Neurological: Negative for difficulty with concentration, dizziness, focal weakness and headaches.  Psychiatric/Behavioral: Negative for altered mental status and suicidal ideas.  All other systems reviewed and are negative.      Objective:     Height '5\' 4"'$  (1.626 m), weight 262 lb 11.2 oz (119.2 kg), last menstrual period 10/22/2011.    Physical Exam  Constitutional: She is oriented to person, place, and time. Vital signs are normal. She appears well-developed and well-nourished.  HENT:  Head: Normocephalic and atraumatic.  Neck: Normal range of motion.  Cardiovascular: Normal rate, regular rhythm and intact distal pulses.  Murmur heard.  Early systolic murmur is present with a grade of 1/6 at the apex. Pulmonary/Chest: Effort normal and breath sounds normal. No accessory muscle usage. No respiratory distress.  Abdominal: Soft. Bowel sounds are normal.  Musculoskeletal: Normal range of motion.        General: Edema (2+ bilateral lower extremity) present.  Neurological: She is alert and oriented to person, place, and time.  Skin: Skin is warm and dry.  Vitals reviewed.          Assessment & Recommendations:   1. Chronic heart failure with preserved ejection fraction (HCC) Stable without evidence of decompensated heart failure. Diastolic heart failure is secondary to her obesity and hypertension. Continue with lisinopril and lasix. Will add aldactone '25mg'$  daily and check BMP in 10 days for surveillance of kidney function.  2. Essential hypertension, benign Well controlled with current medical therapy.   3. Bilateral leg edema She has 2+ pitting edema on exam today. Will add aldactone daily.   4. Obesity, Class III, BMI 40-49.9 (morbid obesity) ([place]) Discussed  diet modifications and importance of weight loss as I suspect that her other issues would improve with weight loss.   I will see her back in 3 months for follow up on diastolic heart failure.   *I have discussed this case with Dr. Virgina Jock and he personally examined the patient and participated in formulating the plan.*    Jeri Lager, [facility], [place] Office: [phone] Fax: [phone]

## 2019-01-03 ENCOUNTER — Other Ambulatory Visit: Payer: Self-pay | Admitting: Cardiology

## 2019-01-04 LAB — BASIC METABOLIC PANEL
BUN/Creatinine Ratio: 19 (ref 12–28)
BUN: 23 mg/dL (ref 8–27)
CO2: 25 mmol/L (ref 20–29)
Calcium: 9.7 mg/dL (ref 8.7–10.3)
Chloride: 107 mmol/L — ABNORMAL HIGH (ref 96–106)
Creatinine, Ser: 1.24 mg/dL — ABNORMAL HIGH (ref 0.57–1.00)
GFR calc Af Amer: 53 mL/min/{1.73_m2} — ABNORMAL LOW (ref >59)
GFR calc non Af Amer: 46 mL/min/{1.73_m2} — ABNORMAL LOW (ref >59)
Glucose: 90 mg/dL (ref 65–99)
Potassium: 5.4 mmol/L — ABNORMAL HIGH (ref 3.5–5.2)
Sodium: 146 mmol/L — ABNORMAL HIGH (ref 134–144)

## 2019-01-16 ENCOUNTER — Telehealth: Payer: Self-pay

## 2019-01-16 ENCOUNTER — Other Ambulatory Visit: Payer: Self-pay | Admitting: Internal Medicine

## 2019-01-16 DIAGNOSIS — Z1231 Encounter for screening mammogram for malignant neoplasm of breast: Secondary | ICD-10-CM

## 2019-01-16 DIAGNOSIS — I5032 Chronic diastolic (congestive) heart failure: Secondary | ICD-10-CM

## 2019-01-16 NOTE — Telephone Encounter (Signed)
BMP order placed to follow up on kidney function with decreased dose of aldactone.

## 2019-01-17 ENCOUNTER — Ambulatory Visit: Payer: BLUE CROSS/BLUE SHIELD | Admitting: Pulmonary Disease

## 2019-02-06 ENCOUNTER — Other Ambulatory Visit: Payer: Self-pay | Admitting: Cardiology

## 2019-02-07 LAB — BASIC METABOLIC PANEL
BUN/Creatinine Ratio: 26 (ref 12–28)
BUN: 32 mg/dL — ABNORMAL HIGH (ref 8–27)
CO2: 21 mmol/L (ref 20–29)
Calcium: 9.5 mg/dL (ref 8.7–10.3)
Chloride: 108 mmol/L — ABNORMAL HIGH (ref 96–106)
Creatinine, Ser: 1.24 mg/dL — ABNORMAL HIGH (ref 0.57–1.00)
GFR calc Af Amer: 53 mL/min/{1.73_m2} — ABNORMAL LOW (ref >59)
GFR calc non Af Amer: 46 mL/min/{1.73_m2} — ABNORMAL LOW (ref >59)
Glucose: 130 mg/dL — ABNORMAL HIGH (ref 65–99)
Potassium: 5 mmol/L (ref 3.5–5.2)
Sodium: 143 mmol/L (ref 134–144)

## 2019-03-15 ENCOUNTER — Ambulatory Visit: Payer: BLUE CROSS/BLUE SHIELD

## 2019-03-20 NOTE — Progress Notes (Signed)
Virtual Visit via Video Note: This visit type was conducted due to national recommendations for restrictions regarding the COVID-19 Pandemic (e.g. social distancing).  This format is felt to be most appropriate for this patient at this time.  All issues noted in this document were discussed and addressed.  No physical exam was performed (except for noted visual exam findings with Telehealth visits).  The patient has consented to conduct a Telehealth visit and understands insurance will be billed.   I connected with@, on 03/21/19 at  by a video enabled telemedicine application and verified that I am speaking with the correct person using two identifiers.   I discussed the limitations of evaluation and management by telemedicine and the availability of in person appointments. The patient expressed understanding and agreed to proceed.   I have discussed with patient regarding the safety during COVID Pandemic and steps and precautions to be taken including social distancing, frequent hand wash and use of detergent soap, gels with the patient. I asked the patient to avoid touching mouth, nose, eyes, ears with the hands. I encouraged regular walking around the neighborhood and exercise and regular diet, as long as social distancing can be maintained.  Primary Physician/Referring:  [name], PA-C  Patient ID: Bridget Lamb, female    DOB: 23-Feb-1956, 63 y.o.   MRN: [id number]  Chief Complaint  Patient presents with  . diastolic heart failure    3 month f/u     HPI: Bridget Lamb  is a 63 y.o. female  with morbid obesity BMI 44, hypertension, hyperlipidemia, HFpEF, here for 3 month follow up.  She is being treated for diastolic heart failure, as well as bronchial asthma. Furosemide was changed 40 mg p.o. daily on discharge, metoprolol succinate 25 mg daily was added. She was counseled regarding diet and lifestyle modifications. Aldactone was discontinued due to renal  insufficiency. Weight  And leg edema has remained stable but notices improvement with furosemide.   Has leg edema intermittently, does not use support stockings regularly. She works at [employer].Patient has sleep apnea and was encouraged to use CPAP; however, she does not feel that she wants to use this and also feels clausrtophobic. Dyspnea has remained stable and leg edema is up and down. No chest pain or change in her weight significantly.   Past Medical History:  Diagnosis Date  . Allergic rhinitis   . Anemia    hematology, Dr. Julien Nordmann, last 01/2011  . CHF (congestive heart failure) ([facility]) 2004  . Complication of anesthesia   . Dyslipidemia   . Dyspnea   . H/O echocardiogram 01/07/04   mild LVH, nl LV systolic function, EF 32%, left atrial enlargement, mildly thickened aortic and mitral valves  . History of thyroid cancer   . Incarcerated ventral hernia 10/07/2013  . Obesity, Class III, BMI 40-49.9 (morbid obesity) ([facility]) 10/09/2013  . Osteopenia   . PONV (postoperative nausea and vomiting)   . Umbilical hernia   . Unspecified essential hypertension 10/09/2013  . Unspecified hypothyroidism 10/09/2013    Past Surgical History:  Procedure Laterality Date  . BONE MARROW BIOPSY    . CARDIAC CATHETERIZATION  2005  . CHOLECYSTECTOMY  2003  . COLONOSCOPY  2008  . HERNIA REPAIR  2014   Ventral Hernia Repair  . THYROID SURGERY     partial thyroidectomy  . TOTAL THYROIDECTOMY  2009  . VENTRAL HERNIA REPAIR N/A 10/07/2013   Procedure: HERNIA REPAIR VENTRAL ADULT;  Surgeon: Gwenyth Ober, MD;  Location: [facility];  Service: General;  Laterality: N/A;    Social History   Socioeconomic History  . Marital status: Divorced    Spouse name: Not on file  . Number of children: 1  . Years of education: Not on file  . Highest education level: Not on file  Occupational History  . Not on file  Social Needs  . Financial resource strain: Not on file  . Food insecurity:    Worry: Not on file     Inability: Not on file  . Transportation needs:    Medical: Not on file    Non-medical: Not on file  Tobacco Use  . Smoking status: Never Smoker  . Smokeless tobacco: Never Used  Substance and Sexual Activity  . Alcohol use: No  . Drug use: No  . Sexual activity: Not on file  Lifestyle  . Physical activity:    Days per week: Not on file    Minutes per session: Not on file  . Stress: Not on file  Relationships  . Social connections:    Talks on phone: Not on file    Gets together: Not on file    Attends religious service: Not on file    Active member of club or organization: Not on file    Attends meetings of clubs or organizations: Not on file    Relationship status: Not on file  . Intimate partner violence:    Fear of current or ex partner: Not on file    Emotionally abused: Not on file    Physically abused: Not on file    Forced sexual activity: Not on file  Other Topics Concern  . Not on file  Social History Narrative  . Not on file    Current Outpatient Medications on File Prior to Visit  Medication Sig Dispense Refill  . albuterol (PROVENTIL HFA;VENTOLIN HFA) 108 (90 Base) MCG/ACT inhaler Inhale 2 puffs into the lungs every 4 (four) hours as needed for wheezing or shortness of breath. 1 Inhaler 0  . albuterol (PROVENTIL) (2.5 MG/3ML) 0.083% nebulizer solution Take 2.5 mg by nebulization every 6 (six) hours as needed for wheezing or shortness of breath.    Marland Kitchen aspirin 81 MG chewable tablet Chew 81 mg by mouth daily.    . calcium citrate-vitamin D (CITRACAL+D) 315-200 MG-UNIT per tablet Take 1 tablet by mouth 2 (two) times daily.    . Fe Fum-FePoly-Vit C-Vit B3 (INTEGRA PO) Take 1 tablet by mouth daily.    Marland Kitchen levothyroxine (SYNTHROID, LEVOTHROID) 150 MCG tablet Take 150 mcg by mouth daily before breakfast.     . lisinopril (PRINIVIL,ZESTRIL) 20 MG tablet Take 20 mg by mouth daily.    Marland Kitchen loratadine (CLARITIN) 10 MG tablet Take 10 mg by mouth every morning.    .  mometasone-formoterol (DULERA) 100-5 MCG/ACT AERO Inhale 2 puffs into the lungs 2 (two) times daily. 1 Inhaler 0  . NIFEdipine (PROCARDIA-XL/ADALAT-CC/NIFEDICAL-XL) 30 MG 24 hr tablet Take 30 mg by mouth daily.    Marland Kitchen olopatadine (PATANOL) 0.1 % ophthalmic solution Place 1 drop into both eyes 2 (two) times daily.    . rosuvastatin (CRESTOR) 10 MG tablet Take 10 mg by mouth at bedtime.     No current facility-administered medications on file prior to visit.    Review of Systems  Constitution: Negative for decreased appetite, malaise/fatigue, weight gain and weight loss.  Eyes: Negative for visual disturbance.  Cardiovascular: Positive for dyspnea on exertion and leg swelling. Negative for chest  pain, claudication, orthopnea, palpitations and syncope.  Respiratory: Positive for snoring (has sleep apnea unable to wear CPAP). Negative for hemoptysis and wheezing.   Endocrine: Negative for cold intolerance and heat intolerance.  Hematologic/Lymphatic: Does not bruise/bleed easily.  Skin: Negative for nail changes.  Musculoskeletal: Positive for joint pain (knee and hips chronic). Negative for muscle weakness and myalgias.  Gastrointestinal: Negative for abdominal pain, change in bowel habit, nausea and vomiting.  Neurological: Negative for difficulty with concentration, dizziness, focal weakness and headaches.  Psychiatric/Behavioral: Negative for altered mental status and suicidal ideas.  All other systems reviewed and are negative.     Objective  Height '5\' 4"'$  (1.626 m), weight 255 lb (115.7 kg), last menstrual period 10/22/2011. Body mass index is 43.77 kg/m. Physical exam not performed or limited due to virtual visit.  Patient appeared to be in no distress, Neck was supple, respiration was not labored.  Please see exam details from prior visit is as below.  Physical Exam  Constitutional: She is oriented to person, place, and time. Vital signs are normal. She appears well-developed and  well-nourished.  HENT:  Head: Normocephalic and atraumatic.  Neck: Normal range of motion.  Cardiovascular: Normal rate, regular rhythm and intact distal pulses.  Murmur heard.  Early systolic murmur is present with a grade of 1/6 at the apex. Pulmonary/Chest: Effort normal and breath sounds normal. No accessory muscle usage. No respiratory distress.  Abdominal: Soft. Bowel sounds are normal.  Musculoskeletal: Normal range of motion.        General: Edema (2+ bilateral lower extremity) present.  Neurological: She is alert and oriented to person, place, and time.  Skin: Skin is warm and dry.  Vitals reviewed.  Radiology: No results found.  Laboratory examination:    CMP Latest Ref Rng & Units 02/06/2019 01/03/2019 08/31/2018  Glucose 65 - 99 mg/dL 130(H) 90 121(H)  BUN 8 - 27 mg/dL 32(H) 23 28(H)  Creatinine 0.57 - 1.00 mg/dL 1.24(H) 1.24(H) 1.09(H)  Sodium 134 - 144 mmol/L 143 146(H) 138  Potassium 3.5 - 5.2 mmol/L 5.0 5.4(H) 4.7  Chloride 96 - 106 mmol/L 108(H) 107(H) 93(L)  CO2 20 - 29 mmol/L 21 25 38(H)  Calcium 8.7 - 10.3 mg/dL 9.5 9.7 9.4  Total Protein 6.0 - 8.3 g/dL - - -  Total Bilirubin 0.3 - 1.2 mg/dL - - -  Alkaline Phos 39 - 117 U/L - - -  AST 0 - 37 U/L - - -  ALT 0 - 35 U/L - - -   CBC Latest Ref Rng & Units 08/31/2018 08/30/2018 08/29/2018  WBC 4.0 - 10.5 K/uL 8.8 7.9 7.0  Hemoglobin 12.0 - 15.0 g/dL 11.4(L) 11.2(L) 11.3(L)  Hematocrit 36.0 - 46.0 % 38.9 37.8 37.7  Platelets 150 - 400 K/uL 213 202 170   Lipid Panel     Component Value Date/Time   CHOL 190 01/31/2012 0909   TRIG 84 01/31/2012 0909   HDL 55 01/31/2012 0909   CHOLHDL 3.5 01/31/2012 0909   VLDL 17 01/31/2012 0909   LDLCALC 118 (H) 01/31/2012 0909   HEMOGLOBIN A1C No results found for: HGBA1C, MPG TSH No results for input(s): TSH in the last 8760 hours.  Cardiac Studies:   Lexiscan Sestamibi Stress Test 01/29/2014: There is a very small and very mild fixed defect in the mid anterior wall  consistent with breast attenuation.. No reversibleperfusion defects are identified.  Echo 08/29/2018:  Left ventricle: The cavity size was normal. Systolic function was   normal.  The estimated ejection fraction was in the range of 60%   to 65%. Images were inadequate for LV wall motion assessment.   Abnormal septal shudder as well as respiratory related leftward   septal shift. Doppler parameters are consistent with high   ventricular filling pressure (E/e&' = 23). - Aortic valve: There was no stenosis. There was no regurgitation.   Mean gradient (S): 6 mm Hg. Valve area (VTI): 1.89 cm^2. Valve   area (Vmax): 1.77 cm^2. Valve area (Vmean): 1.63 cm^2. - Mitral valve: Calcified annulus.  There was mild regurgitation. - Left atrium: The atrium was moderately to severely dilated. - Right ventricle: The cavity size was normal. Wall thickness was   normal. Systolic function was normal. - Right atrium: The atrium was mildly dilated. - Tricuspid valve: There was trivial regurgitation. - Inferior vena cava: The vessel was dilated. The respirophasic  diameter changes were blunted (< 50%), consistent with elevated   central venous pressure. - Pericardium, extracardiac: A trivial pericardial effusion was   identified anterior to the heart. - Increased flow velocities, may be secondary to anemia,   thyrotoxicosis, or high flow state.  Assessment   Chronic diastolic CHF (congestive heart failure) (HCC)  Essential hypertension, benign  Bilateral leg edema  Obesity, Class III, BMI 40-49.9 (morbid obesity) ([place])  EKG 08/29/2018: Normal sinus rhythm at the rate of 93 bpm, left atrial abnormality, rightward axis, poor R-wave progression, nonspecific ST abnormality, low voltage complexes.  Pulmonary disease pattern.  Prolonged QT.  Recommendations:   Patient with chronic diastolic heart failure, unable to tolerate Aldactone due to worsening renal insufficiency and hyperkalemia.  I would increase the dose of  furosemide from daily to twice daily.  I have I had a lengthy discussion with her regarding making lifestyle changes and even losing 10 to 15 pounds in weight.  Also advised her to use support stockings regularly for her leg edema.  She probably has underlying chronic cor pulmonale due to morbid obesity and also underlying bronchial asthma.  Blood pressure is well controlled.  She does have mild hyperlipidemia, lipids are being managed by her PCP.  She has had a negative nuclear stress test about 5 years ago.  Do not suspect progression of or new CAD.  Advised that if symptoms of dyspnea edema were to worsen to immediately contact us.  Otherwise I will see him back in 6 months.  Adrian Prows, MD, [facility] 03/21/2019, 12:35 PM [facility]. [place] Pager: [phone] Office: [phone] If no answer Cell [phone]

## 2019-03-21 ENCOUNTER — Encounter: Payer: Self-pay | Admitting: Cardiology

## 2019-03-21 ENCOUNTER — Ambulatory Visit: Payer: BLUE CROSS/BLUE SHIELD | Admitting: Cardiology

## 2019-03-21 ENCOUNTER — Other Ambulatory Visit: Payer: Self-pay

## 2019-03-21 VITALS — Ht 64.0 in | Wt 255.0 lb

## 2019-03-21 DIAGNOSIS — R600 Localized edema: Secondary | ICD-10-CM

## 2019-03-21 DIAGNOSIS — I5032 Chronic diastolic (congestive) heart failure: Secondary | ICD-10-CM | POA: Diagnosis not present

## 2019-03-21 DIAGNOSIS — I10 Essential (primary) hypertension: Secondary | ICD-10-CM

## 2019-03-21 MED ORDER — FUROSEMIDE 40 MG PO TABS: 40.0000 mg | ORAL_TABLET | Freq: Two times a day (BID) | ORAL | 2 refills | Status: DC

## 2019-04-10 ENCOUNTER — Ambulatory Visit
Admission: RE | Admit: 2019-04-10 | Discharge: 2019-04-10 | Disposition: A | Discharge status: Home / Self Care | Payer: BC Managed Care – PPO | Source: Ambulatory Visit | Attending: Internal Medicine | Admitting: Internal Medicine

## 2019-04-10 ENCOUNTER — Other Ambulatory Visit: Payer: Self-pay

## 2019-04-10 DIAGNOSIS — Z1231 Encounter for screening mammogram for malignant neoplasm of breast: Secondary | ICD-10-CM

## 2019-09-19 HISTORY — PX: BIOPSY BOWEL: PRO7

## 2019-09-25 NOTE — Progress Notes (Signed)
Primary Physician/Referring:  [name], PA-C  Patient ID: Bridget Lamb, female    DOB: 1956/07/30, 63 y.o.   MRN: [id number]  Chief Complaint  Patient presents with  . Congestive Heart Failure  . Follow-up    71mo   HPI: Bridget Lamb  is a 63y.o. female  with morbid obesity BMI 44, hypertension, restrictive airway disease due to bronchial asthma, hyperlipidemia, HFpEF, OSA and not using CPAP due to feeling claustrophobic is here for 6 month follow up.  Dyspnea has remained stable and leg edema is up and down. She's been using furosemide 40 mg b.i.d. No chest pain or change in her weight significantly.   Past Medical History:  Diagnosis Date  . Allergic rhinitis   . Anemia    hematology, Dr. MJulien Nordmann last 01/2011  . CHF (congestive heart failure) [place] 2004  . Complication of anesthesia   . Dyslipidemia   . Dyspnea   . H/O echocardiogram 01/07/04   mild LVH, nl LV systolic function, EF 609% left atrial enlargement, mildly thickened aortic and mitral valves  . History of thyroid cancer   . Incarcerated ventral hernia 10/07/2013  . Obesity, Class III, BMI 40-49.9 (morbid obesity) ([facility] 10/09/2013  . Osteopenia   . PONV (postoperative nausea and vomiting)   . Umbilical hernia   . Unspecified essential hypertension 10/09/2013  . Unspecified hypothyroidism 10/09/2013    Past Surgical History:  Procedure Laterality Date  . BIOPSY BOWEL  09/19/2019   cervix  . BONE MARROW BIOPSY    . CARDIAC CATHETERIZATION  2005  . CHOLECYSTECTOMY  2003  . COLONOSCOPY  2008  . HERNIA REPAIR  2014   Ventral Hernia Repair  . THYROID SURGERY     partial thyroidectomy  . TOTAL THYROIDECTOMY  2009  . VENTRAL HERNIA REPAIR N/A 10/07/2013   Procedure: HERNIA REPAIR VENTRAL ADULT;  Surgeon: JGwenyth Ober MD;  [facility]  Service: General;  Laterality: N/A;    Social History   Socioeconomic History  . Marital status: Divorced    Spouse name: Not on file  .  Number of children: 1  . Years of education: Not on file  . Highest education level: Not on file  Occupational History  . Not on file  Social Needs  . Financial resource strain: Not on file  . Food insecurity    Worry: Not on file    Inability: Not on file  . Transportation needs    Medical: Not on file    Non-medical: Not on file  Tobacco Use  . Smoking status: Never Smoker  . Smokeless tobacco: Never Used  Substance and Sexual Activity  . Alcohol use: No  . Drug use: No  . Sexual activity: Not on file  Lifestyle  . Physical activity    Days per week: Not on file    Minutes per session: Not on file  . Stress: Not on file  Relationships  . Social [place] phone: Not on file    Gets together: Not on file    Attends religious service: Not on file    Active member of club or organization: Not on file    Attends meetings of clubs or organizations: Not on file    Relationship status: Not on file  . Intimate partner violence    Fear of current or ex partner: Not on file    Emotionally abused: Not on file    Physically  abused: Not on file    Forced sexual activity: Not on file  Other Topics Concern  . Not on file  Social History Narrative  . Not on file    Current Outpatient Medications on File Prior to Visit  Medication Sig Dispense Refill  . albuterol (PROVENTIL HFA;VENTOLIN HFA) 108 (90 Base) MCG/ACT inhaler Inhale 2 puffs into the lungs every 4 (four) hours as needed for wheezing or shortness of breath. 1 Inhaler 0  . albuterol (PROVENTIL) (2.5 MG/3ML) 0.083% nebulizer solution Take 2.5 mg by nebulization every 6 (six) hours as needed for wheezing or shortness of breath.    Marland Kitchen aspirin 81 MG chewable tablet Chew 81 mg by mouth daily.    Marland Kitchen CALCIUM CITRATE-VITAMIN D PO Take by mouth daily. '600mg'$  qd    . Fe Fum-FePoly-Vit C-Vit B3 (INTEGRA PO) Take 1 tablet by mouth daily.    . furosemide (LASIX) 40 MG tablet Take 1 tablet (40 mg total) by mouth 2 (two) times  daily. 180 tablet 2  . levothyroxine (SYNTHROID, LEVOTHROID) 150 MCG tablet Take 150 mcg by mouth daily before breakfast.     . lisinopril (PRINIVIL,ZESTRIL) 20 MG tablet Take 20 mg by mouth daily.    Marland Kitchen loratadine (CLARITIN) 10 MG tablet Take 10 mg by mouth every morning.    . medroxyPROGESTERone (PROVERA) 10 MG tablet Take 10 mg by mouth daily.    Marland Kitchen NIFEdipine (PROCARDIA-XL/ADALAT-CC/NIFEDICAL-XL) 30 MG 24 hr tablet Take 30 mg by mouth daily.    Marland Kitchen olopatadine (PATANOL) 0.1 % ophthalmic solution Place 1 drop into both eyes 2 (two) times daily.    . rosuvastatin (CRESTOR) 10 MG tablet Take 10 mg by mouth at bedtime.     No current facility-administered medications on file prior to visit.    Review of Systems  Constitution: Negative for decreased appetite, malaise/fatigue, weight gain and weight loss.  Eyes: Negative for visual disturbance.  Cardiovascular: Positive for dyspnea on exertion and leg swelling. Negative for chest pain, claudication, orthopnea, palpitations and syncope.  Respiratory: Positive for snoring (has sleep apnea unable to wear CPAP). Negative for hemoptysis and wheezing.   Endocrine: Negative for cold intolerance and heat intolerance.  Hematologic/Lymphatic: Does not bruise/bleed easily.  Skin: Negative for nail changes.  Musculoskeletal: Positive for joint pain (knee and hips chronic). Negative for muscle weakness and myalgias.  Gastrointestinal: Negative for abdominal pain, change in bowel habit, nausea and vomiting.  Neurological: Negative for difficulty with concentration, dizziness, focal weakness and headaches.  Psychiatric/Behavioral: Negative for altered mental status and suicidal ideas.  All other systems reviewed and are negative.     Objective   Vitals with BMI 09/26/2019 03/21/2019 12/20/2018  Height '5\' 4"'$  '5\' 4"'$  '5\' 4"'$   Weight 252 lbs 255 lbs 262 lbs 11 oz  BMI 43.23 46.56 81.27  Systolic 517 - 001  Diastolic 63 - 52  Pulse 82 - 73     Physical Exam   Constitutional: She is oriented to person, place, and time. Vital signs are normal. She appears well-developed and well-nourished.  HENT:  Head: Normocephalic and atraumatic.  Neck: Normal range of motion.  Cardiovascular: Normal rate, regular rhythm, intact distal pulses and normal pulses.  Murmur heard.  Early systolic murmur is present with a grade of 1/6 at the apex. 1-2+ bilateral lower extremity, superficial varicose veins noted. No JVD.   Pulmonary/Chest: Effort normal and breath sounds normal. No accessory muscle usage. No respiratory distress.  Abdominal: Soft. Bowel sounds are normal.  Musculoskeletal: Normal  range of motion.  Neurological: She is alert and oriented to person, place, and time.  Skin: Skin is warm and dry.  Vitals reviewed.  Radiology: No results found.  Laboratory examination:    CMP Latest Ref Rng & Units 02/06/2019 01/03/2019 08/31/2018  Glucose 65 - 99 mg/dL 130(H) 90 121(H)  BUN 8 - 27 mg/dL 32(H) 23 28(H)  Creatinine 0.57 - 1.00 mg/dL 1.24(H) 1.24(H) 1.09(H)  Sodium 134 - 144 mmol/L 143 146(H) 138  Potassium 3.5 - 5.2 mmol/L 5.0 5.4(H) 4.7  Chloride 96 - 106 mmol/L 108(H) 107(H) 93(L)  CO2 20 - 29 mmol/L 21 25 38(H)  Calcium 8.7 - 10.3 mg/dL 9.5 9.7 9.4  Total Protein 6.0 - 8.3 g/dL - - -  Total Bilirubin 0.3 - 1.2 mg/dL - - -  Alkaline Phos 39 - 117 U/L - - -  AST 0 - 37 U/L - - -  ALT 0 - 35 U/L - - -   CBC Latest Ref Rng & Units 08/31/2018 08/30/2018 08/29/2018  WBC 4.0 - 10.5 K/uL 8.8 7.9 7.0  Hemoglobin 12.0 - 15.0 g/dL 11.4(L) 11.2(L) 11.3(L)  Hematocrit 36.0 - 46.0 % 38.9 37.8 37.7  Platelets 150 - 400 K/uL 213 202 170   Lipid Panel     Component Value Date/Time   CHOL 190 01/31/2012 0909   TRIG 84 01/31/2012 0909   HDL 55 01/31/2012 0909   CHOLHDL 3.5 01/31/2012 0909   VLDL 17 01/31/2012 0909   LDLCALC 118 (H) 01/31/2012 0909   HEMOGLOBIN A1C No results found for: HGBA1C, MPG TSH No results for input(s): TSH in the last 8760  hours.  Cardiac Studies:   Lexiscan Sestamibi Stress Test 01/29/2014: There is a very small and very mild fixed defect in the mid anterior wall consistent with breast attenuation.. No reversibleperfusion defects are identified.  Echo 08/29/2018:  Left ventricle: The cavity size was normal. Systolic function was   normal. The estimated ejection fraction was in the range of 60%   to 65%. Images were inadequate for LV wall motion assessment.   Abnormal septal shudder as well as respiratory related leftward   septal shift. Doppler parameters are consistent with high   ventricular filling pressure (E/e&' = 23). - Aortic valve: There was no stenosis. There was no regurgitation.   Mean gradient (S): 6 mm Hg. Valve area (VTI): 1.89 cm^2. Valve   area (Vmax): 1.77 cm^2. Valve area (Vmean): 1.63 cm^2. - Mitral valve: Calcified annulus.  There was mild regurgitation. - Left atrium: The atrium was moderately to severely dilated. - Right ventricle: The cavity size was normal. Wall thickness was   normal. Systolic function was normal. - Right atrium: The atrium was mildly dilated. - Tricuspid valve: There was trivial regurgitation. - Inferior vena cava: The vessel was dilated. The respirophasic  diameter changes were blunted (< 50%), consistent with elevated   central venous pressure. - Pericardium, extracardiac: A trivial pericardial effusion was   identified anterior to the heart. - Increased flow velocities, may be secondary to anemia,   thyrotoxicosis, or high flow state.  Assessment     ICD-10-CM   1. Chronic diastolic CHF (congestive heart failure) (HCC)  I50.32 EKG 12-Lead  2. Essential hypertension, benign  I10   3. Bilateral leg edema  R60.0   4. Class 3 severe obesity due to excess calories without serious comorbidity with body mass index (BMI) of 40.0 to 44.9 in adult (HCC)  E66.01    Z68.41  EKG 09/26/2019: Normal sinus rhythm at rate of 82 bpm, normal axis, poor R-wave progression,  cannot exclude anteroseptal infarct old.  Low voltage complexes, pulmonary disease pattern.  EKG 08/29/2018: Normal sinus rhythm at the rate of 93 bpm, left atrial abnormality, rightward axis, poor R-wave progression, nonspecific ST abnormality, low voltage complexes.  Pulmonary disease pattern.  Prolonged QT.  Recommendations:   HPI: Bridget Lamb  is a 63 y.o. female  with morbid obesity BMI 44, hypertension, restrictive airway disease due to bronchial asthma, hyperlipidemia, HFpEF, OSA and not using CPAP due to feeling claustrophobic is here for 6 month follow up.   Also advised her to use support stockings regularly for her leg edema. Advised her to change furosemide to PRN use. Discussed salt restriction and weight loss.  Blood pressure is well controlled.  She does have mild hyperlipidemia, lipids are being managed by her PCP.  She has had a negative nuclear stress test about 5 years ago.  Do not suspect progression of or new CAD.  Advised that if symptoms of dyspnea or edema were to worsen to immediately contact us.  Will see PRN.  Adrian Prows, MD, [facility] 09/26/2019, 11:29 AM [facility] [place] Pager: [phone] Office: [phone] If no answer Cell [phone]

## 2019-09-26 ENCOUNTER — Ambulatory Visit (INDEPENDENT_AMBULATORY_CARE_PROVIDER_SITE_OTHER): Payer: BC Managed Care – PPO | Admitting: Cardiology

## 2019-09-26 ENCOUNTER — Other Ambulatory Visit: Payer: Self-pay

## 2019-09-26 ENCOUNTER — Encounter: Payer: Self-pay | Admitting: Cardiology

## 2019-09-26 VITALS — BP 132/63 | HR 82 | Temp 97.1°F | Ht 64.0 in | Wt 252.0 lb

## 2019-09-26 DIAGNOSIS — Z6841 Body Mass Index (BMI) 40.0 and over, adult: Secondary | ICD-10-CM

## 2019-09-26 DIAGNOSIS — I10 Essential (primary) hypertension: Secondary | ICD-10-CM | POA: Diagnosis not present

## 2019-09-26 DIAGNOSIS — I5032 Chronic diastolic (congestive) heart failure: Secondary | ICD-10-CM

## 2019-09-26 DIAGNOSIS — R600 Localized edema: Secondary | ICD-10-CM | POA: Diagnosis not present

## 2019-10-09 ENCOUNTER — Telehealth: Payer: Self-pay | Admitting: *Deleted

## 2019-10-09 NOTE — Telephone Encounter (Signed)
Patient returned call and scheduled for an appt on Friday. Gave the policy for mask, parking and visitors. Explained she would have an exam done

## 2019-10-09 NOTE — Telephone Encounter (Signed)
Left the patient a message to call the office for a new patient appt

## 2019-10-11 ENCOUNTER — Other Ambulatory Visit: Payer: Self-pay | Admitting: Gynecologic Oncology

## 2019-10-11 ENCOUNTER — Encounter: Payer: Self-pay | Admitting: Gynecologic Oncology

## 2019-10-11 DIAGNOSIS — C541 Malignant neoplasm of endometrium: Secondary | ICD-10-CM

## 2019-10-11 NOTE — H&P (View-Only) (Signed)
GYNECOLOGIC ONCOLOGY NEW PATIENT CONSULTATION   Patient Name: Bridget Lamb  Patient Age: 63 y.o. Date of Service: 10/12/19 Referring Provider: [name], PA-C [address] [place],  [place] [address]   Primary Care Provider: [name], [place] Consulting Provider: Jeral Pinch, MD   Assessment/Plan:  63 year old with clinical stage I endometrial adenocarcinoma, features on biopsy suspicious for clear cell histology.  We reviewed the nature of endometrial cancer and its recommended surgical staging, including total hysterectomy, bilateral salpingo-oophorectomy, and lymph node assessment. The patient is a suitable candidate for staging via a minimally invasive approach to surgery.  We reviewed that robotic assistance would be used to complete the surgery.   We discussed that most endometrial cancer is detected early, however, we reviewed that adjuvant therapy will likely be recommended based on the patient's biopsy, however, we will defer to final pathology results.    Given her high risk histology, we recommend CT scan preoperatively to rule out metastatic disease.  We reviewed the sentinel lymph node technique. Risks and benefits of sentinel lymph node biopsy was reviewed. We reviewed the technique and ICG dye. The patient DOES NOT have an iodine allergy or known liver dysfunction. We reviewed the false negative rate (0.4%), and that 3% of patients with metastatic disease will not have it detected by SLN biopsy in endometrial cancer. A low risk of allergic reaction to the dye, <0.2% for ICG, has been reported. We also discussed that in the case of failed mapping, which occurs 40% of the time, a bilateral or unilateral lymphadenectomy will be performed at the surgeon's discretion.   Potential benefits of sentinel nodes including a higher detection rate for metastasis due to ultrastaging and potential reduction in operative morbidity. However, there remains uncertainty as to  the role for treatment of micrometastatic disease. Further, the benefit of operative morbidity associated with the SLN technique in endometrial cancer is not yet completely known. In other patient populations (e.g. the cervical cancer population) there has been observed reductions in morbidity with SLN biopsy compared to pelvic lymphadenectomy. Lymphedema, nerve dysfunction and lymphocysts are all potential risks with the SLN technique as with complete lymphadenectomy. Additional risks to the patient include the risk of damage to an internal organ while operating in an altered view (e.g. the black and white image of the robotic fluorescence imaging mode).   The patient was consented for a robotic assisted hysterectomy, bilateral salpingo-oophorectomy, sentinel lymph node evaluation, possible lymph node dissection, possible laparotomy. The risks of surgery were discussed in detail and she understands these to include infection; wound separation; hernia; vaginal cuff separation, injury to adjacent organs such as bowel, bladder, blood vessels, ureters and nerves; bleeding which may require blood transfusion; anesthesia risk; thromboembolic events; possible death; unforeseen complications; possible need for re-exploration; medical complications such as heart attack, stroke, pleural effusion and pneumonia; and, if full lymphadenectomy is performed the risk of lymphedema and lymphocyst. The patient will receive DVT and antibiotic prophylaxis as indicated. She voiced a clear understanding. She had the opportunity to ask questions. Perioperative instructions were reviewed with her. Prescriptions for post-op medications were sent to her pharmacy of choice.  Given her significant cardiac history, we discussed need for surgical clearance by her cardiologist.  A message was sent to the cardiologist clinic requesting this.  Additionally, given her surgical history, we discussed the increased risk for adhesion and risk of  laparotomy.  I will call the patient with her CT scan results next week.  We  discussed that occasionally, if metastatic disease is found, plan may be altered to initiate treatment with chemotherapy instead of surgery.  A copy of this note was sent to the patient's referring provider.   Jeral Pinch, MD  Division of Gynecologic Oncology  Department of Obstetrics and Gynecology  [facility]  ___________________________________________  Chief Complaint: Chief Complaint  Patient presents with  . Endometrial cancer ([place])    History of Present Illness:  Bridget Lamb is a 63 y.o. y.o. female who is seen in consultation at the request of [name] for an evaluation of newly diagnosed endometrial cancer.  The patient reports menopause in her late 11s.  She had no postmenopausal bleeding until late October.  The patient was initially seen in late October after being referred for postmenopausal bleeding with clots the month prior, that she described as intermittent in nature as well as having associated left lower quadrant cramping.   Pelvic ultrasound on 09/04/2019 showed anteverted uterus measuring 9.2 x 4.6 x 4.2 cm with thickened endometrial lining measuring 2.14 cm and very hypervascular.  On 11/25, the patient underwent endometrial biopsy with uterus sounding to 9 cm.  Final pathology showed endometrial adenocarcinoma with clear cell features.  She had been on Provera but stopped this at the time of her biopsy  Patient describes having initially very scant bleeding in late October with increased spotting and passage of some clots in November.  She would have bleeding most days although described it is very light and sometimes dark in color.  Since going through menopause, she endorses occasional cramping and aching monthly as though she were about to have her menses.  She has had no change to the symptoms with her spotting.  She endorses having a  good appetite without nausea or emesis.  She reports some chronic constipation but otherwise denies any change to her bowel function and endorses normal urinary function.  In terms of her significant cardiac history, she has recently seen her cardiologist.  She was being seen by [facility] for proteinuria.  Given this finding, her cardiologist stopped her Lasix.  She has not noticed any change in her lower extremity edema.  She describes shortness of breath with ambulation on an incline or stairs.  She is able to walk on flat surfaces for several 100 feet before she becomes short of breath.  She has occasional chest pain if she is trying to get some more quickly.  PAST MEDICAL HISTORY:  Past Medical History:  Diagnosis Date  . Allergic rhinitis   . Anemia    hematology, Dr. Julien Nordmann, last 01/2011  . CHF (congestive heart failure) ([place]) 2004  . Complication of anesthesia   . Dyslipidemia   . Dyspnea   . Endometrial cancer ([place])   . H/O echocardiogram 01/07/04   mild LVH, nl LV systolic function, EF 91%, left atrial enlargement, mildly thickened aortic and mitral valves  . History of thyroid cancer   . Hypertension   . Incarcerated ventral hernia 10/07/2013  . Obesity, Class III, BMI 40-49.9 (morbid obesity) ([place]) 10/09/2013  . Osteopenia   . PONV (postoperative nausea and vomiting)   . Umbilical hernia   . Unspecified essential hypertension 10/09/2013  . Unspecified hypothyroidism 10/09/2013     PAST SURGICAL HISTORY:  Past Surgical History:  Procedure Laterality Date  . BONE MARROW BIOPSY    . CARDIAC CATHETERIZATION  2005  . CHOLECYSTECTOMY  2003  . COLONOSCOPY  2008  . THYROID  SURGERY     partial thyroidectomy  . TOTAL THYROIDECTOMY  2009  . VENTRAL HERNIA REPAIR N/A 10/07/2013   Procedure: HERNIA REPAIR VENTRAL ADULT;  Surgeon: Gwenyth Ober, MD;  Location: [facility];  Service: General;  Laterality: N/A;    OB/GYN HISTORY:  OB History  Gravida Para Term Preterm AB Living   _0 SAB TAB Ectopic Multiple Live Births               # Outcome Date GA Lbr Len/2nd Weight Sex Delivery Anes PTL Lv  3 AB           2 AB           1 Para             Obstetric Comments  Last mammogram: 12/25/2018  Last colonoscopy: 2012  Last Pap smear: 08/15/2019, negative, HPV negative  History of abnormal Pap smears: No  History of sexually transmitted diseases: Denies  Age at menarche: 79    Patient's last menstrual period was 10/22/2011.  Age at menopause: Early 67s Hx of HRT: Denies  SCREENING STUDIES:  See above OB history  MEDICATIONS: Outpatient Encounter Medications as of 10/12/2019  Medication Sig  . albuterol (PROVENTIL HFA;VENTOLIN HFA) 108 (90 Base) MCG/ACT inhaler Inhale 2 puffs into the lungs every 4 (four) hours as needed for wheezing or shortness of breath.  Marland Kitchen albuterol (PROVENTIL) (2.5 MG/3ML) 0.083% nebulizer solution Take 2.5 mg by nebulization every 6 (six) hours as needed for wheezing or shortness of breath.  Marland Kitchen aspirin 81 MG chewable tablet Chew 81 mg by mouth daily.  Marland Kitchen CALCIUM CITRATE-VITAMIN D PO Take by mouth daily. 624m qd  . Fe Fum-FePoly-Vit C-Vit B3 (INTEGRA PO) Take 1 tablet by mouth daily.  .Marland Kitchenlevothyroxine (SYNTHROID, LEVOTHROID) 150 MCG tablet Take 150 mcg by mouth daily before breakfast.   . lisinopril (PRINIVIL,ZESTRIL) 20 MG tablet Take 20 mg by mouth daily.  .Marland Kitchenloratadine (CLARITIN) 10 MG tablet Take 10 mg by mouth every morning.  .Marland KitchenNIFEdipine (PROCARDIA-XL/ADALAT-CC/NIFEDICAL-XL) 30 MG 24 hr tablet Take 30 mg by mouth daily.  .Marland Kitchenolopatadine (PATANOL) 0.1 % ophthalmic solution Place 1 drop into both eyes 2 (two) times daily.  .Marland KitchenoxyCODONE (OXY IR/ROXICODONE) 5 MG immediate release tablet Take 1 tablet (5 mg total) by mouth every 4 (four) hours as needed for severe pain. For AFTER surgery only, do not take and drive  . rosuvastatin (CRESTOR) 10 MG tablet Take 10 mg by mouth at bedtime.  . senna-docusate (SENOKOT-S) 8.6-50 MG tablet  Take 2 tablets by mouth at bedtime. For AFTER surgery, do not take if having diarrhea  . [DISCONTINUED] furosemide (LASIX) 40 MG tablet Take 1 tablet (40 mg total) by mouth 2 (two) times daily.  . [DISCONTINUED] medroxyPROGESTERone (PROVERA) 10 MG tablet Take 10 mg by mouth daily.   No facility-administered encounter medications on file as of 10/12/2019.    ALLERGIES:  No Known Allergies   FAMILY HISTORY:  Family History  Problem Relation Age of Onset  . Hypertension Mother   . Pancreatic cancer Mother   . Breast cancer Maternal Aunt   . Lung cancer Father   . Ovarian cancer Neg Hx   . Endometrial cancer Neg Hx   . Colon cancer Neg Hx      SOCIAL HISTORY:    Social Connections:   . Frequency of Communication with Friends and Family: Not on file  . Frequency of Social  Gatherings with Friends and Family: Not on file  . Attends Religious Services: Not on file  . Active Member of Clubs or Organizations: Not on file  . Attends Archivist Meetings: Not on file  . Marital Status: Not on file    REVIEW OF SYSTEMS:  Positive for vaginal bleeding. Denies appetite changes, fevers, chills, fatigue, unexplained weight changes. Denies hearing loss, neck lumps or masses, mouth sores, ringing in ears or voice changes. Denies cough or wheezing.  Denies shortness of breath. Denies chest pain or palpitations. Denies leg swelling. Denies abdominal distention, pain, blood in stools, constipation, diarrhea, nausea, vomiting, or early satiety. Denies pain with intercourse, dysuria, frequency, hematuria or incontinence. Denies hot flashes, pelvic pain, or vaginal discharge.   Denies joint pain, back pain or muscle pain/cramps. Denies itching, rash, or wounds. Denies dizziness, headaches, numbness or seizures. Denies swollen lymph nodes or glands, denies easy bruising or bleeding. Denies anxiety, depression, confusion, or decreased concentration.  Physical Exam:  Vital Signs for  this encounter:  Blood pressure (!) 146/91, pulse 77, temperature 98.3 F (36.8 C), temperature source Temporal, resp. rate 20, height _0  (1.626 m), weight 255 lb 4 oz (115.8 kg), last menstrual period 10/22/2011, SpO2 100 %. Body mass index is 43.81 kg/m. General: Alert, oriented, no acute distress.  HEENT: Normocephalic, atraumatic. Sclera anicteric.  Chest: Clear to auscultation bilaterally.  Cardiovascular: Regular rate and rhythm, no murmurs, or gallops.  Faint systolic murmur appreciated. Abdomen: Obese. Normoactive bowel sounds. Soft, nondistended, nontender to palpation. No masses or hepatosplenomegaly appreciated. No palpable fluid wave.  Well-healed large incision in the right upper quadrant and 8-10 cm transverse incision just inferior to the umbilicus. Extremities: Grossly normal range of motion. Warm, well perfused. 2+ edema bilaterally.  Skin: No rashes or lesions.  Lymphatics: No cervical, supraclavicular, or inguinal adenopathy.  GU:  Normal external female genitalia. No lesions. No discharge or bleeding.             Bladder/urethra:  No lesions or masses, well supported bladder.             Vagina: Mildly atrophic vaginal mucosa, no lesions, discharge or blood in the vaginal vault.             Cervix: Normal appearing, no lesions.             Uterus: Small, mobile, no parametrial involvement or nodularity.  Exam significantly limited by body habitus.             Adnexa: No masses.  Rectal: No nodularity.  LABORATORY AND RADIOLOGIC DATA:  Outside medical records were reviewed to synthesize the above history, along with the history and physical obtained during the visit.   Pathology from 09/19/2019: Endocervical biopsy-endometrial adenocarcinoma with clear cell features.  IHC stain for p16 shows patchy strong positivity.

## 2019-10-11 NOTE — Progress Notes (Signed)
GYNECOLOGIC ONCOLOGY NEW PATIENT CONSULTATION   Patient Name: Bridget Lamb  Patient Age: 63 y.o. Date of Service: 10/12/19 Referring Provider: [name], PA-C [address] [place],  [place] [address]   Primary Care Provider: [name], [place] Consulting Provider: Jeral Pinch, MD   Assessment/Plan:  63 year old with clinical stage I endometrial adenocarcinoma, features on biopsy suspicious for clear cell histology.  We reviewed the nature of endometrial cancer and its recommended surgical staging, including total hysterectomy, bilateral salpingo-oophorectomy, and lymph node assessment. The patient is a suitable candidate for staging via a minimally invasive approach to surgery.  We reviewed that robotic assistance would be used to complete the surgery.   We discussed that most endometrial cancer is detected early, however, we reviewed that adjuvant therapy will likely be recommended based on the patient's biopsy, however, we will defer to final pathology results.    Given her high risk histology, we recommend CT scan preoperatively to rule out metastatic disease.  We reviewed the sentinel lymph node technique. Risks and benefits of sentinel lymph node biopsy was reviewed. We reviewed the technique and ICG dye. The patient DOES NOT have an iodine allergy or known liver dysfunction. We reviewed the false negative rate (0.4%), and that 3% of patients with metastatic disease will not have it detected by SLN biopsy in endometrial cancer. A low risk of allergic reaction to the dye, <0.2% for ICG, has been reported. We also discussed that in the case of failed mapping, which occurs 40% of the time, a bilateral or unilateral lymphadenectomy will be performed at the surgeon's discretion.   Potential benefits of sentinel nodes including a higher detection rate for metastasis due to ultrastaging and potential reduction in operative morbidity. However, there remains uncertainty as to  the role for treatment of micrometastatic disease. Further, the benefit of operative morbidity associated with the SLN technique in endometrial cancer is not yet completely known. In other patient populations (e.g. the cervical cancer population) there has been observed reductions in morbidity with SLN biopsy compared to pelvic lymphadenectomy. Lymphedema, nerve dysfunction and lymphocysts are all potential risks with the SLN technique as with complete lymphadenectomy. Additional risks to the patient include the risk of damage to an internal organ while operating in an altered view (e.g. the black and white image of the robotic fluorescence imaging mode).   The patient was consented for a robotic assisted hysterectomy, bilateral salpingo-oophorectomy, sentinel lymph node evaluation, possible lymph node dissection, possible laparotomy. The risks of surgery were discussed in detail and she understands these to include infection; wound separation; hernia; vaginal cuff separation, injury to adjacent organs such as bowel, bladder, blood vessels, ureters and nerves; bleeding which may require blood transfusion; anesthesia risk; thromboembolic events; possible death; unforeseen complications; possible need for re-exploration; medical complications such as heart attack, stroke, pleural effusion and pneumonia; and, if full lymphadenectomy is performed the risk of lymphedema and lymphocyst. The patient will receive DVT and antibiotic prophylaxis as indicated. She voiced a clear understanding. She had the opportunity to ask questions. Perioperative instructions were reviewed with her. Prescriptions for post-op medications were sent to her pharmacy of choice.  Given her significant cardiac history, we discussed need for surgical clearance by her cardiologist.  A message was sent to the cardiologist clinic requesting this.  Additionally, given her surgical history, we discussed the increased risk for adhesion and risk of  laparotomy.  I will call the patient with her CT scan results next week.  We  discussed that occasionally, if metastatic disease is found, plan may be altered to initiate treatment with chemotherapy instead of surgery.  A copy of this note was sent to the patient's referring provider.   Jeral Pinch, MD  Division of Gynecologic Oncology  Department of Obstetrics and Gynecology  [facility]  ___________________________________________  Chief Complaint: Chief Complaint  Patient presents with  . Endometrial cancer ([place])    History of Present Illness:  Bridget Lamb is a 63 y.o. y.o. female who is seen in consultation at the request of [name] for an evaluation of newly diagnosed endometrial cancer.  The patient reports menopause in her late 11s.  She had no postmenopausal bleeding until late October.  The patient was initially seen in late October after being referred for postmenopausal bleeding with clots the month prior, that she described as intermittent in nature as well as having associated left lower quadrant cramping.   Pelvic ultrasound on 09/04/2019 showed anteverted uterus measuring 9.2 x 4.6 x 4.2 cm with thickened endometrial lining measuring 2.14 cm and very hypervascular.  On 11/25, the patient underwent endometrial biopsy with uterus sounding to 9 cm.  Final pathology showed endometrial adenocarcinoma with clear cell features.  She had been on Provera but stopped this at the time of her biopsy  Patient describes having initially very scant bleeding in late October with increased spotting and passage of some clots in November.  She would have bleeding most days although described it is very light and sometimes dark in color.  Since going through menopause, she endorses occasional cramping and aching monthly as though she were about to have her menses.  She has had no change to the symptoms with her spotting.  She endorses having a  good appetite without nausea or emesis.  She reports some chronic constipation but otherwise denies any change to her bowel function and endorses normal urinary function.  In terms of her significant cardiac history, she has recently seen her cardiologist.  She was being seen by [facility] for proteinuria.  Given this finding, her cardiologist stopped her Lasix.  She has not noticed any change in her lower extremity edema.  She describes shortness of breath with ambulation on an incline or stairs.  She is able to walk on flat surfaces for several 100 feet before she becomes short of breath.  She has occasional chest pain if she is trying to get some more quickly.  PAST MEDICAL HISTORY:  Past Medical History:  Diagnosis Date  . Allergic rhinitis   . Anemia    hematology, Dr. Julien Nordmann, last 01/2011  . CHF (congestive heart failure) ([place]) 2004  . Complication of anesthesia   . Dyslipidemia   . Dyspnea   . Endometrial cancer ([place])   . H/O echocardiogram 01/07/04   mild LVH, nl LV systolic function, EF 91%, left atrial enlargement, mildly thickened aortic and mitral valves  . History of thyroid cancer   . Hypertension   . Incarcerated ventral hernia 10/07/2013  . Obesity, Class III, BMI 40-49.9 (morbid obesity) ([place]) 10/09/2013  . Osteopenia   . PONV (postoperative nausea and vomiting)   . Umbilical hernia   . Unspecified essential hypertension 10/09/2013  . Unspecified hypothyroidism 10/09/2013     PAST SURGICAL HISTORY:  Past Surgical History:  Procedure Laterality Date  . BONE MARROW BIOPSY    . CARDIAC CATHETERIZATION  2005  . CHOLECYSTECTOMY  2003  . COLONOSCOPY  2008  . THYROID  SURGERY     partial thyroidectomy  . TOTAL THYROIDECTOMY  2009  . VENTRAL HERNIA REPAIR N/A 10/07/2013   Procedure: HERNIA REPAIR VENTRAL ADULT;  Surgeon: Gwenyth Ober, MD;  Location: [facility];  Service: General;  Laterality: N/A;    OB/GYN HISTORY:  OB History  Gravida Para Term Preterm AB Living   _0 SAB TAB Ectopic Multiple Live Births               # Outcome Date GA Lbr Len/2nd Weight Sex Delivery Anes PTL Lv  3 AB           2 AB           1 Para             Obstetric Comments  Last mammogram: 12/25/2018  Last colonoscopy: 2012  Last Pap smear: 08/15/2019, negative, HPV negative  History of abnormal Pap smears: No  History of sexually transmitted diseases: Denies  Age at menarche: 79    Patient's last menstrual period was 10/22/2011.  Age at menopause: Early 67s Hx of HRT: Denies  SCREENING STUDIES:  See above OB history  MEDICATIONS: Outpatient Encounter Medications as of 10/12/2019  Medication Sig  . albuterol (PROVENTIL HFA;VENTOLIN HFA) 108 (90 Base) MCG/ACT inhaler Inhale 2 puffs into the lungs every 4 (four) hours as needed for wheezing or shortness of breath.  Marland Kitchen albuterol (PROVENTIL) (2.5 MG/3ML) 0.083% nebulizer solution Take 2.5 mg by nebulization every 6 (six) hours as needed for wheezing or shortness of breath.  Marland Kitchen aspirin 81 MG chewable tablet Chew 81 mg by mouth daily.  Marland Kitchen CALCIUM CITRATE-VITAMIN D PO Take by mouth daily. 624m qd  . Fe Fum-FePoly-Vit C-Vit B3 (INTEGRA PO) Take 1 tablet by mouth daily.  .Marland Kitchenlevothyroxine (SYNTHROID, LEVOTHROID) 150 MCG tablet Take 150 mcg by mouth daily before breakfast.   . lisinopril (PRINIVIL,ZESTRIL) 20 MG tablet Take 20 mg by mouth daily.  .Marland Kitchenloratadine (CLARITIN) 10 MG tablet Take 10 mg by mouth every morning.  .Marland KitchenNIFEdipine (PROCARDIA-XL/ADALAT-CC/NIFEDICAL-XL) 30 MG 24 hr tablet Take 30 mg by mouth daily.  .Marland Kitchenolopatadine (PATANOL) 0.1 % ophthalmic solution Place 1 drop into both eyes 2 (two) times daily.  .Marland KitchenoxyCODONE (OXY IR/ROXICODONE) 5 MG immediate release tablet Take 1 tablet (5 mg total) by mouth every 4 (four) hours as needed for severe pain. For AFTER surgery only, do not take and drive  . rosuvastatin (CRESTOR) 10 MG tablet Take 10 mg by mouth at bedtime.  . senna-docusate (SENOKOT-S) 8.6-50 MG tablet  Take 2 tablets by mouth at bedtime. For AFTER surgery, do not take if having diarrhea  . [DISCONTINUED] furosemide (LASIX) 40 MG tablet Take 1 tablet (40 mg total) by mouth 2 (two) times daily.  . [DISCONTINUED] medroxyPROGESTERone (PROVERA) 10 MG tablet Take 10 mg by mouth daily.   No facility-administered encounter medications on file as of 10/12/2019.    ALLERGIES:  No Known Allergies   FAMILY HISTORY:  Family History  Problem Relation Age of Onset  . Hypertension Mother   . Pancreatic cancer Mother   . Breast cancer Maternal Aunt   . Lung cancer Father   . Ovarian cancer Neg Hx   . Endometrial cancer Neg Hx   . Colon cancer Neg Hx      SOCIAL HISTORY:    Social Connections:   . Frequency of Communication with Friends and Family: Not on file  . Frequency of Social  Gatherings with Friends and Family: Not on file  . Attends Religious Services: Not on file  . Active Member of Clubs or Organizations: Not on file  . Attends Archivist Meetings: Not on file  . Marital Status: Not on file    REVIEW OF SYSTEMS:  Positive for vaginal bleeding. Denies appetite changes, fevers, chills, fatigue, unexplained weight changes. Denies hearing loss, neck lumps or masses, mouth sores, ringing in ears or voice changes. Denies cough or wheezing.  Denies shortness of breath. Denies chest pain or palpitations. Denies leg swelling. Denies abdominal distention, pain, blood in stools, constipation, diarrhea, nausea, vomiting, or early satiety. Denies pain with intercourse, dysuria, frequency, hematuria or incontinence. Denies hot flashes, pelvic pain, or vaginal discharge.   Denies joint pain, back pain or muscle pain/cramps. Denies itching, rash, or wounds. Denies dizziness, headaches, numbness or seizures. Denies swollen lymph nodes or glands, denies easy bruising or bleeding. Denies anxiety, depression, confusion, or decreased concentration.  Physical Exam:  Vital Signs for  this encounter:  Blood pressure (!) 146/91, pulse 77, temperature 98.3 F (36.8 C), temperature source Temporal, resp. rate 20, height _0  (1.626 m), weight 255 lb 4 oz (115.8 kg), last menstrual period 10/22/2011, SpO2 100 %. Body mass index is 43.81 kg/m. General: Alert, oriented, no acute distress.  HEENT: Normocephalic, atraumatic. Sclera anicteric.  Chest: Clear to auscultation bilaterally.  Cardiovascular: Regular rate and rhythm, no murmurs, or gallops.  Faint systolic murmur appreciated. Abdomen: Obese. Normoactive bowel sounds. Soft, nondistended, nontender to palpation. No masses or hepatosplenomegaly appreciated. No palpable fluid wave.  Well-healed large incision in the right upper quadrant and 8-10 cm transverse incision just inferior to the umbilicus. Extremities: Grossly normal range of motion. Warm, well perfused. 2+ edema bilaterally.  Skin: No rashes or lesions.  Lymphatics: No cervical, supraclavicular, or inguinal adenopathy.  GU:  Normal external female genitalia. No lesions. No discharge or bleeding.             Bladder/urethra:  No lesions or masses, well supported bladder.             Vagina: Mildly atrophic vaginal mucosa, no lesions, discharge or blood in the vaginal vault.             Cervix: Normal appearing, no lesions.             Uterus: Small, mobile, no parametrial involvement or nodularity.  Exam significantly limited by body habitus.             Adnexa: No masses.  Rectal: No nodularity.  LABORATORY AND RADIOLOGIC DATA:  Outside medical records were reviewed to synthesize the above history, along with the history and physical obtained during the visit.   Pathology from 09/19/2019: Endocervical biopsy-endometrial adenocarcinoma with clear cell features.  IHC stain for p16 shows patchy strong positivity.

## 2019-10-12 ENCOUNTER — Other Ambulatory Visit: Payer: Self-pay

## 2019-10-12 ENCOUNTER — Encounter: Payer: Self-pay | Admitting: Gynecologic Oncology

## 2019-10-12 ENCOUNTER — Telehealth: Payer: Self-pay | Admitting: *Deleted

## 2019-10-12 ENCOUNTER — Inpatient Hospital Stay: Payer: BC Managed Care – PPO | Attending: Gynecologic Oncology | Admitting: Gynecologic Oncology

## 2019-10-12 ENCOUNTER — Inpatient Hospital Stay: Payer: BC Managed Care – PPO | Admitting: Gynecologic Oncology

## 2019-10-12 VITALS — BP 146/91 | HR 77 | Temp 98.3°F | Resp 20 | Ht 64.0 in | Wt 255.2 lb

## 2019-10-12 DIAGNOSIS — Z801 Family history of malignant neoplasm of trachea, bronchus and lung: Secondary | ICD-10-CM | POA: Diagnosis not present

## 2019-10-12 DIAGNOSIS — I110 Hypertensive heart disease with heart failure: Secondary | ICD-10-CM

## 2019-10-12 DIAGNOSIS — R809 Proteinuria, unspecified: Secondary | ICD-10-CM | POA: Insufficient documentation

## 2019-10-12 DIAGNOSIS — Z800 Family history of malignant neoplasm of digestive organs: Secondary | ICD-10-CM | POA: Insufficient documentation

## 2019-10-12 DIAGNOSIS — Z803 Family history of malignant neoplasm of breast: Secondary | ICD-10-CM

## 2019-10-12 DIAGNOSIS — K5909 Other constipation: Secondary | ICD-10-CM

## 2019-10-12 DIAGNOSIS — C541 Malignant neoplasm of endometrium: Secondary | ICD-10-CM | POA: Diagnosis not present

## 2019-10-12 DIAGNOSIS — I509 Heart failure, unspecified: Secondary | ICD-10-CM | POA: Insufficient documentation

## 2019-10-12 DIAGNOSIS — N950 Postmenopausal bleeding: Secondary | ICD-10-CM | POA: Diagnosis not present

## 2019-10-12 DIAGNOSIS — Z8249 Family history of ischemic heart disease and other diseases of the circulatory system: Secondary | ICD-10-CM | POA: Insufficient documentation

## 2019-10-12 DIAGNOSIS — Z6841 Body Mass Index (BMI) 40.0 and over, adult: Secondary | ICD-10-CM | POA: Insufficient documentation

## 2019-10-12 LAB — COMPREHENSIVE METABOLIC PANEL
ALT: 10 U/L (ref 0–44)
AST: 15 U/L (ref 15–41)
Albumin: 3.8 g/dL (ref 3.5–5.0)
Alkaline Phosphatase: 72 U/L (ref 38–126)
Anion gap: 7 (ref 5–15)
BUN: 24 mg/dL — ABNORMAL HIGH (ref 8–23)
CO2: 26 mmol/L (ref 22–32)
Calcium: 9.7 mg/dL (ref 8.9–10.3)
Chloride: 108 mmol/L (ref 98–111)
Creatinine, Ser: 1.38 mg/dL — ABNORMAL HIGH (ref 0.44–1.00)
GFR calc Af Amer: 47 mL/min — ABNORMAL LOW (ref >60)
GFR calc non Af Amer: 40 mL/min — ABNORMAL LOW (ref >60)
Glucose, Bld: 105 mg/dL — ABNORMAL HIGH (ref 70–99)
Potassium: 4.8 mmol/L (ref 3.5–5.1)
Sodium: 141 mmol/L (ref 135–145)
Total Bilirubin: 0.3 mg/dL (ref 0.3–1.2)
Total Protein: 7.6 g/dL (ref 6.5–8.1)

## 2019-10-12 MED ORDER — OXYCODONE HCL 5 MG PO TABS: 5.0000 mg | ORAL_TABLET | ORAL | 0 refills | Status: DC | PRN

## 2019-10-12 MED ORDER — SENNOSIDES-DOCUSATE SODIUM 8.6-50 MG PO TABS: 2.0000 | ORAL_TABLET | Freq: Every day | ORAL | 1 refills | Status: DC

## 2019-10-12 NOTE — Telephone Encounter (Signed)
Fax cardiology clearance form to Dr Irven Shelling office

## 2019-10-12 NOTE — Patient Instructions (Addendum)
Preparing for your Surgery  Plan for surgery on October 23, 2019 with Dr. Jeral Pinch at [facility] will be scheduled for a robotic assisted total laparoscopic hysterectomy, bilateral salpingo-oophorectomy, sentinel lymph node biopsy.   YOU CAN CONTINUE YOUR BABY ASPIRIN UP UNTIL THE DAY BEFORE SURGERY.  WE WILL ALSO NEED TO OBTAIN CARDIAC CLEARANCE PRIOR TO SURGERY.  Pre-operative Testing -You will receive a phone call from presurgical testing at [facility] if you have not received a call already to arrange for a pre-operative testing appointment before your surgery.  This appointment normally occurs one to two weeks before your scheduled surgery.   -Bring your insurance card, copy of an advanced directive if applicable, medication list  -At that visit, you will be asked to sign a consent for a possible blood transfusion in case a transfusion becomes necessary during surgery.  The need for a blood transfusion is rare but having consent is a necessary part of your care.     -You should not be taking blood thinners or aspirin at least ten days prior to surgery unless instructed by your surgeon.  -As part of our enhanced surgical recovery pathway, you may be advised to drink a carbohydrate drink the morning of surgery (at least 3 hours before). If you are diabetic, this will be substituted with G2 gatorade in order to prevent elevated glucose levels prior to surgery.  -Do not take supplements such as fish oil (omega 3), red yeast rice, tumeric before your surgery.  Day Before Surgery at [facility] will be asked to take in a light diet the day before surgery.  Avoid carbonated beverages.  You will be advised to have nothing to eat or drink after midnight the evening before.    Eat a light diet the day before surgery.  Examples including soups, broths, toast, yogurt, mashed potatoes.  Things to avoid include carbonated beverages (fizzy beverages), raw fruits and raw  vegetables, or beans.   If your bowels are filled with gas, your surgeon will have difficulty visualizing your pelvic organs which increases your surgical risks.  Your role in recovery Your role is to become active as soon as directed by your doctor, while still giving yourself time to heal.  Rest when you feel tired. You will be asked to do the following in order to speed your recovery:  - Cough and breathe deeply. This helps toclear and expand your lungs and can prevent pneumonia.  - Do mild physical activity. Walking or moving your legs help your circulation and body functions return to normal. A staff member will help you when you try to walk and will provide you with simple exercises. Do not try to get up or walk alone the first time. - Actively manage your pain. Managing your pain lets you move in comfort. We will ask you to rate your pain on a scale of zero to 10. It is your responsibility to tell your doctor or nurse where and how much you hurt so your pain can be treated.  Special Considerations -If you are diabetic, you may be placed on insulin after surgery to have closer control over your blood sugars to promote healing and recovery.  This does not mean that you will be discharged on insulin.  If applicable, your oral antidiabetics will be resumed when you are tolerating a solid diet.  -Your final pathology results from surgery should be available around one week after surgery and the results will be relayed  to you when available.  -Dr. Lahoma Crocker is the surgeon that assists your GYN Oncologist with surgery.  If you end up staying the night, the next day after your surgery you will either see Dr. Denman George or Dr. Lahoma Crocker.  -FMLA forms can be faxed to [phone] and please allow 5-7 business days for completion.  Pain Management After Surgery -You have been prescribed your pain medication and bowel regimen medications before surgery so that you can have these  available when you are discharged from the hospital. The pain medication is for use ONLY AFTER surgery and a new prescription will not be given.   -Make sure that you have Tylenol at home to use on a regular basis after surgery for pain control. We recommend alternating the medications every hour to six hours since they work differently and are processed in the body differently for pain relief.  -Review the attached handout on narcotic use and their risks and side effects.   Bowel Regimen -You have been prescribed Sennakot-S to take nightly to prevent constipation especially if you are taking the narcotic pain medication intermittently.  It is important to prevent constipation and drink adequate amounts of liquids.  Blood Transfusion Information WHAT IS A BLOOD TRANSFUSION? A transfusion is the replacement of blood or some of its parts. Blood is made up of multiple cells which provide different functions.  Red blood cells carry oxygen and are used for blood loss replacement.  White blood cells fight against infection.  Platelets control bleeding.  Plasma helps clot blood.  Other blood products are available for specialized needs, such as hemophilia or other clotting disorders. BEFORE THE TRANSFUSION  Who gives blood for transfusions?   You may be able to donate blood to be used at a later date on yourself (autologous donation).  Relatives can be asked to donate blood. This is generally not any safer than if you have received blood from a stranger. The same precautions are taken to ensure safety when a relative's blood is donated.  Healthy volunteers who are fully evaluated to make sure their blood is safe. This is blood bank blood. Transfusion therapy is the safest it has ever been in the practice of medicine. Before blood is taken from a donor, a complete history is taken to make sure that person has no history of diseases nor engages in risky social behavior (examples are intravenous  drug use or sexual activity with multiple partners). The donor's travel history is screened to minimize risk of transmitting infections, such as malaria. The donated blood is tested for signs of infectious diseases, such as HIV and hepatitis. The blood is then tested to be sure it is compatible with you in order to minimize the chance of a transfusion reaction. If you or a relative donates blood, this is often done in anticipation of surgery and is not appropriate for emergency situations. It takes many days to process the donated blood. RISKS AND COMPLICATIONS Although transfusion therapy is very safe and saves many lives, the main dangers of transfusion include:   Getting an infectious disease.  Developing a transfusion reaction. This is an allergic reaction to something in the blood you were given. Every precaution is taken to prevent this. The decision to have a blood transfusion has been considered carefully by your caregiver before blood is given. Blood is not given unless the benefits outweigh the risks.  AFTER SURGERY INSTRUCTIONS  10/12/2019  Return to work: 4-6 weeks if applicable  Activity: 1.  Be up and out of the bed during the day.  Take a nap if needed.  You may walk up steps but be careful and use the hand rail.  Stair climbing will tire you more than you think, you may need to stop part way and rest.   2. No lifting or straining for 6 weeks.  3. No driving for 1 week(s).  Do not drive if you are taking narcotic pain medicine.  4. Shower daily.  Use soap and water on your incision and pat dry; don't rub.  No tub baths until cleared by your surgeon.   5. No sexual activity and nothing in the vagina for 8 weeks.  6. You may experience a small amount of clear drainage from your incisions, which is normal.  If the drainage persists or increases, please call the office.  7. You may experience vaginal spotting after surgery or around the 6-8 week mark from surgery when the  stitches at the top of the vagina begin to dissolve.  The spotting is normal but if you experience heavy bleeding, call our office.  8. Take Tylenol first for pain and only use narcotic pain medication for severe pain not relieved by the Tylenol.  Monitor your Tylenol intake to a max of 4,000 mg.  Diet: 1. Low sodium Heart Healthy Diet is recommended.  2. It is safe to use a laxative, such as Miralax or Colace, if you have difficulty moving your bowels. You can take Sennakot at bedtime every evening to keep bowel movements regular and to prevent constipation.    Wound Care: 1. Keep clean and dry.  Shower daily.  Reasons to call the Doctor:  Fever - Oral temperature greater than 100.4 degrees Fahrenheit  Foul-smelling vaginal discharge  Difficulty urinating  Nausea and vomiting  Increased pain at the site of the incision that is unrelieved with pain medicine.  Difficulty breathing with or without chest pain  New calf pain especially if only on one side  Sudden, continuing increased vaginal bleeding with or without clots.   Contacts: For questions or concerns you should contact:  Dr. Jeral Pinch at [phone]  Joylene John, NP at [phone]  After Hours: call [phone] and have the GYN Oncologist paged/contacted

## 2019-10-13 LAB — CA 125: Cancer Antigen (CA) 125: 48.9 U/mL — ABNORMAL HIGH (ref 0.0–38.1)

## 2019-10-16 ENCOUNTER — Other Ambulatory Visit: Payer: Self-pay

## 2019-10-16 ENCOUNTER — Telehealth: Payer: Self-pay

## 2019-10-16 ENCOUNTER — Ambulatory Visit (HOSPITAL_COMMUNITY)
Admission: RE | Admit: 2019-10-16 | Discharge: 2019-10-16 | Disposition: A | Discharge status: Home / Self Care | Payer: BC Managed Care – PPO | Source: Ambulatory Visit | Attending: Gynecologic Oncology | Admitting: Gynecologic Oncology

## 2019-10-16 DIAGNOSIS — C541 Malignant neoplasm of endometrium: Secondary | ICD-10-CM | POA: Diagnosis present

## 2019-10-16 MED ORDER — IOHEXOL 300 MG/ML  SOLN
100.0000 mL | Freq: Once | INTRAMUSCULAR | Status: AC | PRN
  Administered 2019-10-16: 100 mL via INTRAVENOUS

## 2019-10-16 NOTE — Telephone Encounter (Signed)
Told Bridget Lamb that her surgery had to be moved to 10-25-19 due to decreased availability in the or and the 10-23-19 slot was taken per Joylene John, NP. Pt verbalized. Pt to keep CT appointment today as scheduled

## 2019-10-17 ENCOUNTER — Telehealth: Payer: Self-pay | Admitting: Gynecologic Oncology

## 2019-10-17 ENCOUNTER — Other Ambulatory Visit: Payer: Self-pay | Admitting: Gynecologic Oncology

## 2019-10-17 ENCOUNTER — Encounter: Payer: Self-pay | Admitting: Cardiology

## 2019-10-17 DIAGNOSIS — C541 Malignant neoplasm of endometrium: Secondary | ICD-10-CM

## 2019-10-17 NOTE — Patient Instructions (Addendum)
DUE TO COVID-19 ONLY ONE VISITOR IS ALLOWED TO COME WITH YOU AND STAY IN THE WAITING ROOM ONLY DURING PRE OP AND PROCEDURE DAY OF SURGERY. THE 1 VISITOR MAY VISIT WITH YOU AFTER SURGERY IN YOUR PRIVATE ROOM DURING VISITING HOURS ONLY!  YOU NEED TO HAVE A COVID 19 TEST ON: 10/22/19 @   12:30 PM       , THIS TEST MUST BE DONE BEFORE SURGERY, COME  [address], [place] , [address].  ([place]) ONCE YOUR COVID TEST IS COMPLETED, PLEASE BEGIN THE QUARANTINE INSTRUCTIONS AS OUTLINED IN YOUR HANDOUT.                Bridget Lamb    Your procedure is scheduled on: 10/25/2019   Report to [facility] Main  Entrance   Report to short stay at: 5:45 AM     Call this number if you have problems the morning of surgery [phone]    Remember:    . BRUSH YOUR TEETH MORNING OF SURGERY AND RINSE YOUR MOUTH OUT, NO CHEWING GUM CANDY OR MINTS.     Take these medicines the morning of surgery with A SIP OF WATER: Levothyroxine,Loratadine,Nifedipine. Inhalers.                                 You may not have any metal on your body including hair pins and              piercings  Do not wear jewelry, make-up, lotions, powders or perfumes, deodorant             Do not wear nail polish on your fingernails.  Do not shave  48 hours prior to surgery.           .   Do not bring valuables to the hospital. [place].  Contacts, dentures or bridgework may not be worn into surgery.  Leave suitcase in the car. After surgery it may be brought to your room.     Patients discharged the day of surgery will not be allowed to drive home. IF YOU ARE HAVING SURGERY AND GOING HOME THE SAME DAY, YOU MUST HAVE AN ADULT TO DRIVE YOU HOME AND BE WITH YOU FOR 24 HOURS. YOU MAY GO HOME BY TAXI OR UBER OR ORTHERWISE, BUT AN ADULT MUST ACCOMPANY YOU HOME AND STAY WITH YOU FOR 24 HOURS.  Name and phone number of your driver:  Special  Instructions: N/A              Please read over the following fact sheets you were given: _____________________________________________________________________             Eat a light diet the day before surgery.  Examples including soups, broths, toast, yogurt, mashed potatoes.  Things to avoid include carbonated beverages (fizzy beverages), raw fruits and raw vegetables, or beans.   If your bowels are filled with gas, your surgeon will have difficulty visualizing your pelvic organs which increases your surgical risks.   NO SOLID FOOD AFTER MIDNIGHT THE NIGHT PRIOR TO SURGERY. NOTHING BY MOUTH EXCEPT CLEAR LIQUIDS UNTIL: 4:45 am . PLEASE FINISH GATORADE DRINK PER SURGEON ORDER  WHICH NEEDS TO BE COMPLETED AT: 4:45 am .       CLEAR LIQUID DIET   Foods  Allowed                                                                     Foods Excluded  Coffee and tea, regular and decaf                             liquids that you cannot  Plain Jell-O any favor except red or purple                                           see through such as: Fruit ices (not with fruit pulp)                                     milk, soups, orange juice  Iced Popsicles                                    All solid food Carbonated beverages, regular and diet                                    Cranberry, grape and apple juices Sports drinks like Gatorade Lightly seasoned clear broth or consume(fat free) Sugar, honey syrup  Sample Menu Breakfast                                Lunch                                     Supper Cranberry juice                    Beef broth                            Chicken broth Jell-O                                     Grape juice                           Apple juice Coffee or tea                        Jell-O                                      Popsicle  Coffee or tea                        Coffee or  tea  _____________________________________________________________________  [facility] - Preparing for Surgery Before surgery, you can play an important role.  Because skin is not sterile, your skin needs to be as free of germs as possible.  You can reduce the number of germs on your skin by washing with CHG (chlorahexidine gluconate) soap before surgery.  CHG is an antiseptic cleaner which kills germs and bonds with the skin to continue killing germs even after washing. Please DO NOT use if you have an allergy to CHG or antibacterial soaps.  If your skin becomes reddened/irritated stop using the CHG and inform your nurse when you arrive at Short Stay. Do not shave (including legs and underarms) for at least 48 hours prior to the first CHG shower.  You may shave your face/neck. Please follow these instructions carefully:  1.  Shower with CHG Soap the night before surgery and the  morning of Surgery.  2.  If you choose to wash your hair, wash your hair first as usual with your  normal  shampoo.  3.  After you shampoo, rinse your hair and body thoroughly to remove the  shampoo.                           4.  Use CHG as you would any other liquid soap.  You can apply chg directly  to the skin and wash                       Gently with a scrungie or clean washcloth.  5.  Apply the CHG Soap to your body ONLY FROM THE NECK DOWN.   Do not use on face/ open                           Wound or open sores. Avoid contact with eyes, ears mouth and genitals (private parts).                       Wash face,  Genitals (private parts) with your normal soap.             6.  Wash thoroughly, paying special attention to the area where your surgery  will be performed.  7.  Thoroughly rinse your body with warm water from the neck down.  8.  DO NOT shower/wash with your normal soap after using and rinsing off  the CHG Soap.                9.  Pat yourself dry with a clean towel.            10.  Wear clean pajamas.             11.  Place clean sheets on your bed the night of your first shower and do not  sleep with pets. Day of Surgery : Do not apply any lotions/deodorants the morning of surgery.  Please wear clean clothes to the hospital/surgery center.  FAILURE TO FOLLOW THESE INSTRUCTIONS MAY RESULT IN THE CANCELLATION OF YOUR SURGERY PATIENT SIGNATURE_________________________________  NURSE SIGNATURE__________________________________  ________________________________________________________________________   Bridget Lamb  An incentive spirometer is a tool that can help keep your lungs clear and active. This tool measures how well  you are filling your lungs with each breath. Taking long deep breaths may help reverse or decrease the chance of developing breathing (pulmonary) problems (especially infection) following:  A long period of time when you are unable to move or be active. BEFORE THE PROCEDURE   If the spirometer includes an indicator to show your best effort, your nurse or respiratory therapist will set it to a desired goal.  If possible, sit up straight or lean slightly forward. Try not to slouch.  Hold the incentive spirometer in an upright position. INSTRUCTIONS FOR USE  1. Sit on the edge of your bed if possible, or sit up as far as you can in bed or on a chair. 2. Hold the incentive spirometer in an upright position. 3. Breathe out normally. 4. Place the mouthpiece in your mouth and seal your lips tightly around it. 5. Breathe in slowly and as deeply as possible, raising the piston or the ball toward the top of the column. 6. Hold your breath for 3-5 seconds or for as long as possible. Allow the piston or ball to fall to the bottom of the column. 7. Remove the mouthpiece from your mouth and breathe out normally. 8. Rest for a few seconds and repeat Steps 1 through 7 at least 10 times every 1-2 hours when you are awake. Take your time and take a few normal breaths between deep  breaths. 9. The spirometer may include an indicator to show your best effort. Use the indicator as a goal to work toward during each repetition. 10. After each set of 10 deep breaths, practice coughing to be sure your lungs are clear. If you have an incision (the cut made at the time of surgery), support your incision when coughing by placing a pillow or rolled up towels firmly against it. Once you are able to get out of bed, walk around indoors and cough well. You may stop using the incentive spirometer when instructed by your caregiver.  RISKS AND COMPLICATIONS  Take your time so you do not get dizzy or light-headed.  If you are in pain, you may need to take or ask for pain medication before doing incentive spirometry. It is harder to take a deep breath if you are having pain. AFTER USE  Rest and breathe slowly and easily.  It can be helpful to keep track of a log of your progress. Your caregiver can provide you with a simple table to help with this. If you are using the spirometer at home, follow these instructions: [place] IF:   You are having difficultly using the spirometer.  You have trouble using the spirometer as often as instructed.  Your pain medication is not giving enough relief while using the spirometer.  You develop fever of 100.5 F (38.1 C) or higher. SEEK IMMEDIATE MEDICAL CARE IF:   You cough up bloody sputum that had not been present before.  You develop fever of 102 F (38.9 C) or greater.  You develop worsening pain at or near the incision site. MAKE SURE YOU:   Understand these instructions.  Will watch your condition.  Will get help right away if you are not doing well or get worse. Document Released: 02/21/2007 Document Revised: 01/03/2012 Document Reviewed: 04/24/2007 [facility] Patient Information 2014 [place], [place].   WHAT IS A BLOOD TRANSFUSION? Blood Transfusion Information  A transfusion is the replacement of blood or some of its  parts. Blood is made up of multiple cells which provide different  functions.  Red blood cells carry oxygen and are used for blood loss replacement.  White blood cells fight against infection.  Platelets control bleeding.  Plasma helps clot blood.  Other blood products are available for specialized needs, such as hemophilia or other clotting disorders. BEFORE THE TRANSFUSION  Who gives blood for transfusions?   Healthy volunteers who are fully evaluated to make sure their blood is safe. This is blood bank blood. Transfusion therapy is the safest it has ever been in the practice of medicine. Before blood is taken from a donor, a complete history is taken to make sure that person has no history of diseases nor engages in risky social behavior (examples are intravenous drug use or sexual activity with multiple partners). The donor's travel history is screened to minimize risk of transmitting infections, such as malaria. The donated blood is tested for signs of infectious diseases, such as HIV and hepatitis. The blood is then tested to be sure it is compatible with you in order to minimize the chance of a transfusion reaction. If you or a relative donates blood, this is often done in anticipation of surgery and is not appropriate for emergency situations. It takes many days to process the donated blood. RISKS AND COMPLICATIONS Although transfusion therapy is very safe and saves many lives, the main dangers of transfusion include:   Getting an infectious disease.  Developing a transfusion reaction. This is an allergic reaction to something in the blood you were given. Every precaution is taken to prevent this. The decision to have a blood transfusion has been considered carefully by your caregiver before blood is given. Blood is not given unless the benefits outweigh the risks. AFTER THE TRANSFUSION  Right after receiving a blood transfusion, you will usually feel much better and more energetic.  This is especially true if your red blood cells have gotten low (anemic). The transfusion raises the level of the red blood cells which carry oxygen, and this usually causes an energy increase.  The nurse administering the transfusion will monitor you carefully for complications. HOME CARE INSTRUCTIONS  No special instructions are needed after a transfusion. You may find your energy is better. Speak with your caregiver about any limitations on activity for underlying diseases you may have. SEEK MEDICAL CARE IF:   Your condition is not improving after your transfusion.  You develop redness or irritation at the intravenous (IV) site. SEEK IMMEDIATE MEDICAL CARE IF:  Any of the following symptoms occur over the next 12 hours:  Shaking chills.  You have a temperature by mouth above 102 F (38.9 C), not controlled by medicine.  Chest, back, or muscle pain.  People around you feel you are not acting correctly or are confused.  Shortness of breath or difficulty breathing.  Dizziness and fainting.  You get a rash or develop hives.  You have a decrease in urine output.  Your urine turns a dark color or changes to pink, red, or brown. Any of the following symptoms occur over the next 10 days:  You have a temperature by mouth above 102 F (38.9 C), not controlled by medicine.  Shortness of breath.  Weakness after normal activity.  The white part of the eye turns yellow (jaundice).  You have a decrease in the amount of urine or are urinating less often.  Your urine turns a dark color or changes to pink, red, or brown. Document Released: 10/08/2000 Document Revised: 01/03/2012 Document Reviewed: 05/27/2008 [facility] Patient Information 2014 [place].  _______________________________________________________________________ ________________________________________________________________________  

## 2019-10-17 NOTE — Telephone Encounter (Signed)
Called patient to review labs and recent CT scan.  No answer, left message requesting callback.  Jeral Pinch MD Gynecologic Oncology

## 2019-10-22 ENCOUNTER — Ambulatory Visit (HOSPITAL_COMMUNITY)
Admission: RE | Admit: 2019-10-22 | Discharge: 2019-10-22 | Disposition: A | Discharge status: Home / Self Care | Payer: BC Managed Care – PPO | Source: Ambulatory Visit | Attending: Gynecologic Oncology | Admitting: Gynecologic Oncology

## 2019-10-22 ENCOUNTER — Encounter (HOSPITAL_COMMUNITY)
Admission: RE | Admit: 2019-10-22 | Discharge: 2019-10-22 | Disposition: A | Discharge status: Home / Self Care | Payer: BC Managed Care – PPO | Source: Ambulatory Visit | Attending: Gynecologic Oncology | Admitting: Gynecologic Oncology

## 2019-10-22 ENCOUNTER — Encounter (HOSPITAL_COMMUNITY): Payer: Self-pay

## 2019-10-22 ENCOUNTER — Other Ambulatory Visit: Payer: Self-pay

## 2019-10-22 ENCOUNTER — Other Ambulatory Visit (HOSPITAL_COMMUNITY)
Admission: RE | Admit: 2019-10-22 | Discharge: 2019-10-22 | Disposition: A | Discharge status: Home / Self Care | Payer: BC Managed Care – PPO | Source: Ambulatory Visit | Attending: Gynecologic Oncology | Admitting: Gynecologic Oncology

## 2019-10-22 DIAGNOSIS — Z90710 Acquired absence of both cervix and uterus: Secondary | ICD-10-CM | POA: Insufficient documentation

## 2019-10-22 DIAGNOSIS — N189 Chronic kidney disease, unspecified: Secondary | ICD-10-CM | POA: Diagnosis not present

## 2019-10-22 DIAGNOSIS — E669 Obesity, unspecified: Secondary | ICD-10-CM | POA: Insufficient documentation

## 2019-10-22 DIAGNOSIS — J45998 Other asthma: Secondary | ICD-10-CM | POA: Diagnosis not present

## 2019-10-22 DIAGNOSIS — Z01812 Encounter for preprocedural laboratory examination: Secondary | ICD-10-CM | POA: Insufficient documentation

## 2019-10-22 DIAGNOSIS — Z7901 Long term (current) use of anticoagulants: Secondary | ICD-10-CM | POA: Insufficient documentation

## 2019-10-22 DIAGNOSIS — E785 Hyperlipidemia, unspecified: Secondary | ICD-10-CM | POA: Diagnosis not present

## 2019-10-22 DIAGNOSIS — E039 Hypothyroidism, unspecified: Secondary | ICD-10-CM | POA: Insufficient documentation

## 2019-10-22 DIAGNOSIS — I700 Atherosclerosis of aorta: Secondary | ICD-10-CM | POA: Diagnosis not present

## 2019-10-22 DIAGNOSIS — E890 Postprocedural hypothyroidism: Secondary | ICD-10-CM | POA: Insufficient documentation

## 2019-10-22 DIAGNOSIS — D649 Anemia, unspecified: Secondary | ICD-10-CM | POA: Insufficient documentation

## 2019-10-22 DIAGNOSIS — Z20828 Contact with and (suspected) exposure to other viral communicable diseases: Secondary | ICD-10-CM | POA: Insufficient documentation

## 2019-10-22 DIAGNOSIS — Z79899 Other long term (current) drug therapy: Secondary | ICD-10-CM | POA: Diagnosis not present

## 2019-10-22 DIAGNOSIS — J45909 Unspecified asthma, uncomplicated: Secondary | ICD-10-CM | POA: Insufficient documentation

## 2019-10-22 DIAGNOSIS — I509 Heart failure, unspecified: Secondary | ICD-10-CM | POA: Insufficient documentation

## 2019-10-22 DIAGNOSIS — C541 Malignant neoplasm of endometrium: Secondary | ICD-10-CM | POA: Diagnosis not present

## 2019-10-22 DIAGNOSIS — Z7982 Long term (current) use of aspirin: Secondary | ICD-10-CM | POA: Diagnosis not present

## 2019-10-22 DIAGNOSIS — Z85850 Personal history of malignant neoplasm of thyroid: Secondary | ICD-10-CM | POA: Insufficient documentation

## 2019-10-22 DIAGNOSIS — Z6841 Body Mass Index (BMI) 40.0 and over, adult: Secondary | ICD-10-CM | POA: Insufficient documentation

## 2019-10-22 DIAGNOSIS — I130 Hypertensive heart and chronic kidney disease with heart failure and stage 1 through stage 4 chronic kidney disease, or unspecified chronic kidney disease: Secondary | ICD-10-CM | POA: Insufficient documentation

## 2019-10-22 DIAGNOSIS — Z9049 Acquired absence of other specified parts of digestive tract: Secondary | ICD-10-CM | POA: Insufficient documentation

## 2019-10-22 DIAGNOSIS — I10 Essential (primary) hypertension: Secondary | ICD-10-CM | POA: Insufficient documentation

## 2019-10-22 HISTORY — DX: Chronic kidney disease, unspecified: N18.9

## 2019-10-22 HISTORY — DX: Unspecified asthma, uncomplicated: J45.909

## 2019-10-22 LAB — BASIC METABOLIC PANEL
Anion gap: 7 (ref 5–15)
BUN: 23 mg/dL (ref 8–23)
CO2: 29 mmol/L (ref 22–32)
Calcium: 9.7 mg/dL (ref 8.9–10.3)
Chloride: 105 mmol/L (ref 98–111)
Creatinine, Ser: 1.33 mg/dL — ABNORMAL HIGH (ref 0.44–1.00)
GFR calc Af Amer: 49 mL/min — ABNORMAL LOW (ref >60)
GFR calc non Af Amer: 42 mL/min — ABNORMAL LOW (ref >60)
Glucose, Bld: 95 mg/dL (ref 70–99)
Potassium: 4.8 mmol/L (ref 3.5–5.1)
Sodium: 141 mmol/L (ref 135–145)

## 2019-10-22 LAB — URINALYSIS, ROUTINE W REFLEX MICROSCOPIC
Bilirubin Urine: NEGATIVE
Glucose, UA: NEGATIVE mg/dL
Ketones, ur: NEGATIVE mg/dL
Leukocytes,Ua: NEGATIVE
Nitrite: NEGATIVE
Protein, ur: NEGATIVE mg/dL
Specific Gravity, Urine: 1.01 (ref 1.005–1.030)
pH: 6 (ref 5.0–8.0)

## 2019-10-22 LAB — ABO/RH: ABO/RH(D): AB POS

## 2019-10-22 LAB — CBC
HCT: 31.5 % — ABNORMAL LOW (ref 36.0–46.0)
Hemoglobin: 9.7 g/dL — ABNORMAL LOW (ref 12.0–15.0)
MCH: 32.6 pg (ref 26.0–34.0)
MCHC: 30.8 g/dL (ref 30.0–36.0)
MCV: 105.7 fL — ABNORMAL HIGH (ref 80.0–100.0)
Platelets: 180 10*3/uL (ref 150–400)
RBC: 2.98 MIL/uL — ABNORMAL LOW (ref 3.87–5.11)
RDW: 12.6 % (ref 11.5–15.5)
WBC: 6.8 10*3/uL (ref 4.0–10.5)
nRBC: 0 % (ref 0.0–0.2)

## 2019-10-22 NOTE — Progress Notes (Signed)
Called patient again to review labs and CT. No answer, left voicemail.  Jeral Pinch MD

## 2019-10-22 NOTE — H&P (View-Only) (Signed)
Called patient again to review labs and CT. No answer, left voicemail.  Jeral Pinch MD

## 2019-10-22 NOTE — Progress Notes (Addendum)
PCP -  Roe Coombs. LOV: 10/02/18. EPIC Cardiologist - Ulice Dash Ganji:clearance: 10/13/19. Epic/Chart Pulmunologist: Evette Georges. LOV: 10/02/18 Chest x-ray -  EKG - 09/26/19. EPIC Stress Test -  ECHO - 08/30/18. Epic. Cardiac Cath -   Sleep Study -  CPAP -   Fasting Blood Sugar -  Checks Blood Sugar _____ times a day  Blood Thinner Instructions:Take aspirin 81 mg.until 10/24/19. Aspirin Instructions:By surgeon's office Last Dose:Pt. Michela Pitcher it will be the day before surgery as per MD. instructions  Anesthesia review:   Patient denies shortness of breath, fever, cough and chest pain at PAT appointment   Patient verbalized understanding of instructions that were given to them at the PAT appointment. Patient was also instructed that they will need to review over the PAT instructions again at home before surgery.

## 2019-10-23 LAB — NOVEL CORONAVIRUS, NAA (HOSP ORDER, SEND-OUT TO REF LAB; TAT 18-24 HRS): SARS-CoV-2, NAA: NOT DETECTED

## 2019-10-23 NOTE — Progress Notes (Signed)
Anesthesia Chart Review   Case: [id number] Date/Time: 10/25/19 0715   Procedures:      XI ROBOTIC ASSISTED TOTAL HYSTERECTOMY WITH BILATERAL SALPINGO OOPHORECTOMY (N/A )     SENTINEL LYMPH NODE BIOPSY (N/A )   Anesthesia type: General   Pre-op diagnosis: ENDOMETRIAL CANCER   Location: [facility] ROOM 02 / [facility]   Surgeons: Lafonda Mosses, MD      DISCUSSION:63 y.o. never smoker with h/o PONV, HTN, restrictive airway disease due to bronchial asthma, CKD, HFpEF, OSA and not using CPAP due to feeling claustrophobic, endometrial cancer scheduled for above procedure 10/25/2019 with Dr. Jeral Pinch.   Pt last seen by cardiologist, Dr. Adrian Prows, 09/26/2019.  Per OV note, "Dyspnea has remained stable and leg edema is up and down. She's been using furosemide 40 mg b.i.d. No chest pain or change in her weight significantly.  She does have mild hyperlipidemia, lipids are being managed by her PCP.  She has had a negative nuclear stress test about 5 years ago.  Do not suspect progression of or new CAD.  Advised that if symptoms of dyspnea or edema were to worsen to immediately contact us.  Will see PRN."  Echo 08/30/2018 with EF estimated 60-65%.  Anticipate pt can proceed with planned procedure barring acute status change and after evaluation DOS.   VS: BP (!) 164/63 (BP Location: Left Arm)   Pulse 92   Temp 36.8 C (Oral)   Resp 19   Ht '5\' 4"'$  (1.626 m)   Wt 116.1 kg   LMP 10/22/2011   SpO2 100%   BMI 43.93 kg/m   PROVIDERS: [name], PA-C is PCP   Adrian Prows, MD is Cardiologist   Evette Georges, MD is Pulmonologist, manages OSA  LABS: Labs reviewed: Acceptable for surgery. (all labs ordered are listed, but only abnormal results are displayed)  Labs Reviewed  BASIC METABOLIC PANEL - Abnormal; Notable for the following components:      Result Value   Creatinine, Ser 1.33 (*)    GFR calc non Af Amer 42 (*)    GFR calc Af Amer 49 (*)    All other components within normal  limits  CBC - Abnormal; Notable for the following components:   RBC 2.98 (*)    Hemoglobin 9.7 (*)    HCT 31.5 (*)    MCV 105.7 (*)    All other components within normal limits  URINALYSIS, ROUTINE W REFLEX MICROSCOPIC - Abnormal; Notable for the following components:   Hgb urine dipstick MODERATE (*)    Bacteria, UA RARE (*)    All other components within normal limits  TYPE AND SCREEN  ABO/RH     IMAGES: Chest Xray 10/22/2019 FINDINGS: Heart size within normal limits. Aortic atherosclerosis. No consolidation within the lungs. No evidence of pleural effusion or pneumothorax. No acute bony abnormality. Surgical clips project over the lower neck.  IMPRESSION: No evidence of acute cardiopulmonary abnormality.  Aortic atherosclerosis.  EKG: 09/26/2019 Rate 82 bpm Normal sinus rhythm at rate of 82 bpm, normal axis, poor R-wave progression, cannot exclude anteroseptal infarct old.  Low voltage complexes, pulmonary disease pattern.   CV: Echo 08/29/2018:  Left ventricle: The cavity size was normal. Systolic function was normal. The estimated ejection fraction was in the range of 60% to 65%. Images were inadequate for LV wall motion assessment. Abnormal septal shudder as well as respiratory related leftward septal shift. Doppler parameters are consistent with high ventricular filling pressure (E/e&' = 23). -  Aortic valve: There was no stenosis. There was no regurgitation. Mean gradient (S): 6 mm Hg. Valve area (VTI): 1.89 cm^2. Valve area (Vmax): 1.77 cm^2. Valve area (Vmean): 1.63 cm^2. - Mitral valve: Calcified annulus. There was mild regurgitation. - Left atrium: The atrium was moderately to severely dilated. - Right ventricle: The cavity size was normal. Wall thickness was normal. Systolic function was normal. - Right atrium: The atrium was mildly dilated. - Tricuspid valve: There was trivial regurgitation. - Inferior vena cava: The vessel was dilated. The  respirophasic diameter changes were blunted (<50%), consistent with elevated central venous pressure. - Pericardium, extracardiac: A trivial pericardial effusion was identified anterior to the heart. - Increased flow velocities, may be secondary to anemia, thyrotoxicosis, or high flow state.  Lexiscan Sestamibi Stress Test 01/29/2014: There is a very small and very mild fixed defect in the mid anterior wall consistent with breast attenuation.. No reversibleperfusion defects are identified. Past Medical History:  Diagnosis Date  . Allergic rhinitis   . Anemia    hematology, Dr. Julien Nordmann, last 01/2011  . Asthma   . CHF (congestive heart failure) ([place]) 2004  . Chronic kidney disease    protein in urine  . Complication of anesthesia   . Dyslipidemia   . Dyspnea    on excertion  . Endometrial cancer ([place])   . H/O echocardiogram 01/07/04   mild LVH, nl LV systolic function, EF 80%, left atrial enlargement, mildly thickened aortic and mitral valves  . History of thyroid cancer   . Hypertension   . Incarcerated ventral hernia 10/07/2013  . Obesity, Class III, BMI 40-49.9 (morbid obesity) ([place]) 10/09/2013  . Osteopenia   . PONV (postoperative nausea and vomiting)   . Umbilical hernia   . Unspecified essential hypertension 10/09/2013  . Unspecified hypothyroidism 10/09/2013    Past Surgical History:  Procedure Laterality Date  . BONE MARROW BIOPSY    . CARDIAC CATHETERIZATION  2005  . CHOLECYSTECTOMY  2003  . COLONOSCOPY  2008  . THYROID SURGERY     partial thyroidectomy  . TOTAL THYROIDECTOMY  2009  . VENTRAL HERNIA REPAIR N/A 10/07/2013   Procedure: HERNIA REPAIR VENTRAL ADULT;  Surgeon: Gwenyth Ober, MD;  Location: [facility];  Service: General;  Laterality: N/A;    MEDICATIONS: . albuterol (PROVENTIL HFA;VENTOLIN HFA) 108 (90 Base) MCG/ACT inhaler  . albuterol (PROVENTIL) (2.5 MG/3ML) 0.083% nebulizer solution  . aspirin EC 81 MG tablet  . Calcium Carbonate-Vitamin D  (CALCIUM 600+D PO)  . Fe Fum-FePoly-Vit C-Vit B3 (INTEGRA PO)  . levothyroxine (SYNTHROID, LEVOTHROID) 150 MCG tablet  . lisinopril (PRINIVIL,ZESTRIL) 20 MG tablet  . loratadine (CLARITIN) 10 MG tablet  . NIFEdipine (PROCARDIA-XL/ADALAT-CC/NIFEDICAL-XL) 30 MG 24 hr tablet  . olopatadine (PATANOL) 0.1 % ophthalmic solution  . oxyCODONE (OXY IR/ROXICODONE) 5 MG immediate release tablet  . rosuvastatin (CRESTOR) 10 MG tablet  . senna-docusate (SENOKOT-S) 8.6-50 MG tablet   No current facility-administered medications for this encounter.    Maia Plan [facility] Pre-Surgical Testing [phone] 10/23/19  9:49 AM

## 2019-10-23 NOTE — Anesthesia Preprocedure Evaluation (Addendum)
Anesthesia Evaluation  Patient identified by MRN, date of birth, ID band Patient awake    Reviewed: Allergy & Precautions, NPO status , Patient's Chart, lab work & pertinent test results  History of Anesthesia Complications (+) PONV and history of anesthetic complications  Airway Mallampati: III  TM Distance: >3 FB Neck ROM: Full    Dental  (+) Dental Advisory Given, Missing   Pulmonary asthma ,    Pulmonary exam normal        Cardiovascular hypertension, Pt. on medications Normal cardiovascular exam  Echo 08/29/2018: Left ventricle: The cavity size was normal. Systolic function was normal. The estimated ejection fraction was in the range of 60% to 65%. Images were inadequate for LV wall motion assessment. Abnormal septal shudder as well as respiratory related leftward septal shift. Doppler parameters are consistent with high ventricular filling pressure (E/e&' = 23). - Aortic valve: There was no stenosis. There was no regurgitation. Mean gradient (S): 6 mm Hg. Valve area (VTI): 1.89 cm^2. Valve area (Vmax): 1.77 cm^2. Valve area (Vmean): 1.63 cm^2. - Mitral valve: Calcified annulus. There was mild regurgitation. - Left atrium: The atrium was moderately to severely dilated. - Right ventricle: The cavity size was normal. Wall thickness was normal. Systolic function was normal. - Right atrium: The atrium was mildly dilated. - Tricuspid valve: There was trivial regurgitation. - Inferior vena cava: The vessel was dilated. The respirophasic diameter changes were blunted (<50%), consistent with elevated central venous pressure. - Pericardium, extracardiac: A trivial pericardial effusion was identified anterior to the heart. - Increased flow velocities, may be secondary to anemia, thyrotoxicosis, or high flow state.  Lexiscan Sestamibi Stress Test 01/29/2014: There is a very small and very mild fixed defect in the  mid anterior wall consistent with breast attenuation.. No reversibleperfusion defects are identified.   Neuro/Psych negative neurological ROS     GI/Hepatic negative GI ROS, Neg liver ROS,   Endo/Other  Hypothyroidism   Renal/GU Renal InsufficiencyRenal disease     Musculoskeletal negative musculoskeletal ROS (+)   Abdominal   Peds  Hematology negative hematology ROS (+)   Anesthesia Other Findings Day of surgery medications reviewed with the patient.  Reproductive/Obstetrics                           Anesthesia Physical Anesthesia Plan  ASA: III  Anesthesia Plan: General   Post-op Pain Management:    Induction: Intravenous  PONV Risk Score and Plan: 4 or greater and Ondansetron, Dexamethasone, Midazolam and Scopolamine patch - Pre-op  Airway Management Planned: Oral ETT  Additional Equipment:   Intra-op Plan:   Post-operative Plan: Extubation in OR  Informed Consent: I have reviewed the patients History and Physical, chart, labs and discussed the procedure including the risks, benefits and alternatives for the proposed anesthesia with the patient or authorized representative who has indicated his/her understanding and acceptance.     Dental advisory given  Plan Discussed with: Anesthesiologist and CRNA  Anesthesia Plan Comments: (See PAT note 10/22/2019, Konrad Felix, PA-C)      Anesthesia Quick Evaluation

## 2019-10-24 ENCOUNTER — Telehealth: Payer: Self-pay

## 2019-10-24 NOTE — Telephone Encounter (Signed)
I spoke with Bridget Lamb this am.  She had no questions regarding surgery.  We reviewed the diet instructions.  I let her know she is covid-19 negative. She verbalized understanding.

## 2019-10-25 ENCOUNTER — Other Ambulatory Visit: Payer: Self-pay

## 2019-10-25 ENCOUNTER — Ambulatory Visit (HOSPITAL_COMMUNITY): Payer: BC Managed Care – PPO | Admitting: Anesthesiology

## 2019-10-25 ENCOUNTER — Encounter (HOSPITAL_COMMUNITY)
Admission: RE | Disposition: A | Discharge status: Home / Self Care | Payer: Self-pay | Source: Home / Self Care | Attending: Obstetrics & Gynecology

## 2019-10-25 ENCOUNTER — Encounter (HOSPITAL_COMMUNITY): Payer: Self-pay | Admitting: Gynecologic Oncology

## 2019-10-25 ENCOUNTER — Ambulatory Visit (HOSPITAL_COMMUNITY): Payer: BC Managed Care – PPO | Admitting: Physician Assistant

## 2019-10-25 ENCOUNTER — Ambulatory Visit (HOSPITAL_COMMUNITY)
Admission: RE | Admit: 2019-10-25 | Discharge: 2019-10-26 | Disposition: A | Discharge status: Home / Self Care | Payer: BC Managed Care – PPO | Attending: Obstetrics & Gynecology | Admitting: Obstetrics & Gynecology

## 2019-10-25 DIAGNOSIS — J45909 Unspecified asthma, uncomplicated: Secondary | ICD-10-CM | POA: Insufficient documentation

## 2019-10-25 DIAGNOSIS — Z7982 Long term (current) use of aspirin: Secondary | ICD-10-CM | POA: Insufficient documentation

## 2019-10-25 DIAGNOSIS — Z79890 Hormone replacement therapy (postmenopausal): Secondary | ICD-10-CM | POA: Diagnosis not present

## 2019-10-25 DIAGNOSIS — Z79899 Other long term (current) drug therapy: Secondary | ICD-10-CM | POA: Diagnosis not present

## 2019-10-25 DIAGNOSIS — M8580 Other specified disorders of bone density and structure, unspecified site: Secondary | ICD-10-CM | POA: Diagnosis not present

## 2019-10-25 DIAGNOSIS — N800 Endometriosis of uterus: Secondary | ICD-10-CM | POA: Diagnosis not present

## 2019-10-25 DIAGNOSIS — Z85850 Personal history of malignant neoplasm of thyroid: Secondary | ICD-10-CM | POA: Insufficient documentation

## 2019-10-25 DIAGNOSIS — E890 Postprocedural hypothyroidism: Secondary | ICD-10-CM | POA: Diagnosis not present

## 2019-10-25 DIAGNOSIS — I509 Heart failure, unspecified: Secondary | ICD-10-CM | POA: Diagnosis not present

## 2019-10-25 DIAGNOSIS — I110 Hypertensive heart disease with heart failure: Secondary | ICD-10-CM | POA: Diagnosis not present

## 2019-10-25 DIAGNOSIS — Z6841 Body Mass Index (BMI) 40.0 and over, adult: Secondary | ICD-10-CM | POA: Diagnosis not present

## 2019-10-25 DIAGNOSIS — E785 Hyperlipidemia, unspecified: Secondary | ICD-10-CM | POA: Insufficient documentation

## 2019-10-25 DIAGNOSIS — D259 Leiomyoma of uterus, unspecified: Secondary | ICD-10-CM | POA: Diagnosis not present

## 2019-10-25 DIAGNOSIS — C541 Malignant neoplasm of endometrium: Secondary | ICD-10-CM | POA: Diagnosis present

## 2019-10-25 HISTORY — PX: ROBOTIC ASSISTED TOTAL HYSTERECTOMY WITH BILATERAL SALPINGO OOPHERECTOMY: SHX6086

## 2019-10-25 HISTORY — PX: SENTINEL NODE BIOPSY: SHX6608

## 2019-10-25 LAB — BASIC METABOLIC PANEL
Anion gap: 10 (ref 5–15)
BUN: 21 mg/dL (ref 8–23)
CO2: 26 mmol/L (ref 22–32)
Calcium: 9.4 mg/dL (ref 8.9–10.3)
Chloride: 103 mmol/L (ref 98–111)
Creatinine, Ser: 1.46 mg/dL — ABNORMAL HIGH (ref 0.44–1.00)
GFR calc Af Amer: 44 mL/min — ABNORMAL LOW (ref >60)
GFR calc non Af Amer: 38 mL/min — ABNORMAL LOW (ref >60)
Glucose, Bld: 157 mg/dL — ABNORMAL HIGH (ref 70–99)
Potassium: 5.2 mmol/L — ABNORMAL HIGH (ref 3.5–5.1)
Sodium: 139 mmol/L (ref 135–145)

## 2019-10-25 LAB — CBC
HCT: 30.7 % — ABNORMAL LOW (ref 36.0–46.0)
Hemoglobin: 9.5 g/dL — ABNORMAL LOW (ref 12.0–15.0)
MCH: 32.5 pg (ref 26.0–34.0)
MCHC: 30.9 g/dL (ref 30.0–36.0)
MCV: 105.1 fL — ABNORMAL HIGH (ref 80.0–100.0)
Platelets: 168 10*3/uL (ref 150–400)
RBC: 2.92 MIL/uL — ABNORMAL LOW (ref 3.87–5.11)
RDW: 12.4 % (ref 11.5–15.5)
WBC: 10.2 10*3/uL (ref 4.0–10.5)
nRBC: 0 % (ref 0.0–0.2)

## 2019-10-25 LAB — TYPE AND SCREEN
ABO/RH(D): AB POS
Antibody Screen: NEGATIVE

## 2019-10-25 SURGERY — HYSTERECTOMY, TOTAL, ROBOT-ASSISTED, LAPAROSCOPIC, WITH BILATERAL SALPINGO-OOPHORECTOMY
Anesthesia: General

## 2019-10-25 MED ORDER — ROCURONIUM BROMIDE 10 MG/ML (PF) SYRINGE
PREFILLED_SYRINGE | INTRAVENOUS | Status: DC | PRN
  Administered 2019-10-25: 60 mg via INTRAVENOUS
  Administered 2019-10-25 (×2): 20 mg via INTRAVENOUS

## 2019-10-25 MED ORDER — OLOPATADINE HCL 0.1 % OP SOLN
1.0000 [drp] | Freq: Two times a day (BID) | OPHTHALMIC | Status: DC
  Administered 2019-10-25 – 2019-10-26 (×2): 1 [drp] via OPHTHALMIC
  Filled 2019-10-25: qty 5

## 2019-10-25 MED ORDER — LIDOCAINE 2% (20 MG/ML) 5 ML SYRINGE
INTRAMUSCULAR | Status: AC
  Filled 2019-10-25: qty 10

## 2019-10-25 MED ORDER — ALBUTEROL SULFATE (2.5 MG/3ML) 0.083% IN NEBU: 2.5000 mg | INHALATION_SOLUTION | RESPIRATORY_TRACT | Status: DC | PRN

## 2019-10-25 MED ORDER — ALBUTEROL SULFATE HFA 108 (90 BASE) MCG/ACT IN AERS
INHALATION_SPRAY | RESPIRATORY_TRACT | Status: AC
  Filled 2019-10-25: qty 6.7

## 2019-10-25 MED ORDER — LEVOTHYROXINE SODIUM 75 MCG PO TABS
150.0000 ug | ORAL_TABLET | Freq: Every day | ORAL | Status: DC
  Administered 2019-10-26: 150 ug via ORAL
  Filled 2019-10-25: qty 2

## 2019-10-25 MED ORDER — BUPIVACAINE HCL 0.25 % IJ SOLN
INTRAMUSCULAR | Status: DC | PRN
  Administered 2019-10-25: 40 mL

## 2019-10-25 MED ORDER — NIFEDIPINE ER OSMOTIC RELEASE 30 MG PO TB24
30.0000 mg | ORAL_TABLET | Freq: Every day | ORAL | Status: DC
  Administered 2019-10-26: 30 mg via ORAL
  Filled 2019-10-25: qty 1

## 2019-10-25 MED ORDER — LIDOCAINE 2% (20 MG/ML) 5 ML SYRINGE
INTRAMUSCULAR | Status: AC
  Filled 2019-10-25: qty 5

## 2019-10-25 MED ORDER — ALBUTEROL SULFATE HFA 108 (90 BASE) MCG/ACT IN AERS: 2.0000 | INHALATION_SPRAY | RESPIRATORY_TRACT | Status: DC | PRN

## 2019-10-25 MED ORDER — ONDANSETRON HCL 4 MG/2ML IJ SOLN: 4.0000 mg | Freq: Four times a day (QID) | INTRAMUSCULAR | Status: DC | PRN

## 2019-10-25 MED ORDER — ROCURONIUM BROMIDE 10 MG/ML (PF) SYRINGE
PREFILLED_SYRINGE | INTRAVENOUS | Status: AC
  Filled 2019-10-25: qty 10

## 2019-10-25 MED ORDER — ACETAMINOPHEN 500 MG PO TABS
1000.0000 mg | ORAL_TABLET | Freq: Four times a day (QID) | ORAL | Status: DC
  Administered 2019-10-25 – 2019-10-26 (×4): 1000 mg via ORAL
  Filled 2019-10-25 (×4): qty 2

## 2019-10-25 MED ORDER — HEPARIN SODIUM (PORCINE) 5000 UNIT/ML IJ SOLN
5000.0000 [IU] | INTRAMUSCULAR | Status: AC
  Administered 2019-10-25: 5000 [IU] via SUBCUTANEOUS
  Filled 2019-10-25: qty 1

## 2019-10-25 MED ORDER — CEFAZOLIN SODIUM-DEXTROSE 2-4 GM/100ML-% IV SOLN
2.0000 g | INTRAVENOUS | Status: AC
  Administered 2019-10-25: 08:00:00 2 g via INTRAVENOUS
  Filled 2019-10-25: qty 100

## 2019-10-25 MED ORDER — PROPOFOL 10 MG/ML IV BOLUS
INTRAVENOUS | Status: DC | PRN
  Administered 2019-10-25: 140 ug via INTRAVENOUS

## 2019-10-25 MED ORDER — ACETAMINOPHEN 500 MG PO TABS
1000.0000 mg | ORAL_TABLET | Freq: Once | ORAL | Status: DC
  Filled 2019-10-25: qty 2

## 2019-10-25 MED ORDER — BUPIVACAINE HCL (PF) 0.25 % IJ SOLN
INTRAMUSCULAR | Status: AC
  Filled 2019-10-25: qty 30

## 2019-10-25 MED ORDER — SENNOSIDES-DOCUSATE SODIUM 8.6-50 MG PO TABS
2.0000 | ORAL_TABLET | Freq: Every day | ORAL | Status: DC
  Administered 2019-10-25: 2 via ORAL
  Filled 2019-10-25: qty 2

## 2019-10-25 MED ORDER — FENTANYL CITRATE (PF) 100 MCG/2ML IJ SOLN: 25.0000 ug | INTRAMUSCULAR | Status: DC | PRN

## 2019-10-25 MED ORDER — LIDOCAINE 2% (20 MG/ML) 5 ML SYRINGE
INTRAMUSCULAR | Status: DC | PRN
  Administered 2019-10-25: 1.5 mg/kg/h via INTRAVENOUS

## 2019-10-25 MED ORDER — SUGAMMADEX SODIUM 500 MG/5ML IV SOLN
INTRAVENOUS | Status: DC | PRN
  Administered 2019-10-25: 400 mg via INTRAVENOUS

## 2019-10-25 MED ORDER — FENTANYL CITRATE (PF) 100 MCG/2ML IJ SOLN
INTRAMUSCULAR | Status: AC
  Filled 2019-10-25: qty 2

## 2019-10-25 MED ORDER — ONDANSETRON HCL 4 MG/2ML IJ SOLN
INTRAMUSCULAR | Status: DC | PRN
  Administered 2019-10-25 (×2): 4 mg via INTRAVENOUS

## 2019-10-25 MED ORDER — PROPOFOL 10 MG/ML IV BOLUS
INTRAVENOUS | Status: AC
  Filled 2019-10-25: qty 20

## 2019-10-25 MED ORDER — STERILE WATER FOR INJECTION IJ SOLN
INTRAMUSCULAR | Status: AC
  Filled 2019-10-25: qty 10

## 2019-10-25 MED ORDER — DEXAMETHASONE SODIUM PHOSPHATE 4 MG/ML IJ SOLN: 4.0000 mg | INTRAMUSCULAR | Status: DC

## 2019-10-25 MED ORDER — SCOPOLAMINE 1 MG/3DAYS TD PT72
1.0000 | MEDICATED_PATCH | TRANSDERMAL | Status: DC
  Administered 2019-10-25: 06:00:00 1.5 mg via TRANSDERMAL
  Filled 2019-10-25: qty 1

## 2019-10-25 MED ORDER — HYDROMORPHONE HCL 1 MG/ML IJ SOLN: 0.5000 mg | INTRAMUSCULAR | Status: DC | PRN

## 2019-10-25 MED ORDER — MIDAZOLAM HCL 2 MG/2ML IJ SOLN
INTRAMUSCULAR | Status: AC
  Filled 2019-10-25: qty 2

## 2019-10-25 MED ORDER — FENTANYL CITRATE (PF) 100 MCG/2ML IJ SOLN
INTRAMUSCULAR | Status: DC | PRN
  Administered 2019-10-25 (×2): 50 ug via INTRAVENOUS

## 2019-10-25 MED ORDER — ONDANSETRON HCL 4 MG PO TABS: 4.0000 mg | ORAL_TABLET | Freq: Four times a day (QID) | ORAL | Status: DC | PRN

## 2019-10-25 MED ORDER — LACTATED RINGERS IR SOLN
Status: DC | PRN
  Administered 2019-10-25: 1000 mL

## 2019-10-25 MED ORDER — OXYCODONE HCL 5 MG PO TABS: 5.0000 mg | ORAL_TABLET | ORAL | Status: DC | PRN

## 2019-10-25 MED ORDER — GABAPENTIN 100 MG PO CAPS
200.0000 mg | ORAL_CAPSULE | ORAL | Status: AC
  Administered 2019-10-25: 200 mg via ORAL
  Filled 2019-10-25: qty 2

## 2019-10-25 MED ORDER — PROMETHAZINE HCL 25 MG/ML IJ SOLN: 6.2500 mg | INTRAMUSCULAR | Status: DC | PRN

## 2019-10-25 MED ORDER — EPHEDRINE SULFATE-NACL 50-0.9 MG/10ML-% IV SOSY
PREFILLED_SYRINGE | INTRAVENOUS | Status: DC | PRN
  Administered 2019-10-25: 25 mg via INTRAVENOUS

## 2019-10-25 MED ORDER — DEXTROSE-NACL 5-0.45 % IV SOLN: INTRAVENOUS | Status: DC

## 2019-10-25 MED ORDER — KETAMINE HCL 10 MG/ML IJ SOLN
INTRAMUSCULAR | Status: DC | PRN
  Administered 2019-10-25: 30 mg via INTRAVENOUS

## 2019-10-25 MED ORDER — PHENYLEPHRINE HCL-NACL 10-0.9 MG/250ML-% IV SOLN
INTRAVENOUS | Status: DC | PRN
  Administered 2019-10-25: 25 ug/min via INTRAVENOUS

## 2019-10-25 MED ORDER — KCL IN DEXTROSE-NACL 20-5-0.45 MEQ/L-%-% IV SOLN
INTRAVENOUS | Status: DC
  Filled 2019-10-25: qty 1000

## 2019-10-25 MED ORDER — LACTATED RINGERS IV SOLN: INTRAVENOUS | Status: DC

## 2019-10-25 MED ORDER — ALBUTEROL SULFATE HFA 108 (90 BASE) MCG/ACT IN AERS
INHALATION_SPRAY | RESPIRATORY_TRACT | Status: DC | PRN
  Administered 2019-10-25: 2 via RESPIRATORY_TRACT

## 2019-10-25 MED ORDER — LIDOCAINE 2% (20 MG/ML) 5 ML SYRINGE
INTRAMUSCULAR | Status: DC | PRN
  Administered 2019-10-25: 80 mg via INTRAVENOUS

## 2019-10-25 MED ORDER — STERILE WATER FOR IRRIGATION IR SOLN
Status: DC | PRN
  Administered 2019-10-25: 1000 mL

## 2019-10-25 MED ORDER — ROSUVASTATIN CALCIUM 10 MG PO TABS
10.0000 mg | ORAL_TABLET | Freq: Every day | ORAL | Status: DC
  Administered 2019-10-25: 10 mg via ORAL
  Filled 2019-10-25: qty 1

## 2019-10-25 MED ORDER — EPHEDRINE 5 MG/ML INJ
INTRAVENOUS | Status: AC
  Filled 2019-10-25: qty 10

## 2019-10-25 MED ORDER — CELECOXIB 200 MG PO CAPS
400.0000 mg | ORAL_CAPSULE | Freq: Once | ORAL | Status: AC
  Administered 2019-10-25: 06:00:00 400 mg via ORAL
  Filled 2019-10-25: qty 2

## 2019-10-25 MED ORDER — DEXAMETHASONE SODIUM PHOSPHATE 10 MG/ML IJ SOLN
INTRAMUSCULAR | Status: DC | PRN
  Administered 2019-10-25: 8 mg via INTRAVENOUS

## 2019-10-25 MED ORDER — DEXAMETHASONE SODIUM PHOSPHATE 10 MG/ML IJ SOLN
INTRAMUSCULAR | Status: AC
  Filled 2019-10-25: qty 1

## 2019-10-25 MED ORDER — KETAMINE HCL 10 MG/ML IJ SOLN
INTRAMUSCULAR | Status: AC
  Filled 2019-10-25: qty 1

## 2019-10-25 MED ORDER — GABAPENTIN 300 MG PO CAPS
300.0000 mg | ORAL_CAPSULE | Freq: Two times a day (BID) | ORAL | Status: DC
  Administered 2019-10-25 – 2019-10-26 (×2): 300 mg via ORAL
  Filled 2019-10-25 (×2): qty 1

## 2019-10-25 MED ORDER — ACETAMINOPHEN 500 MG PO TABS
1000.0000 mg | ORAL_TABLET | ORAL | Status: AC
  Administered 2019-10-25: 1000 mg via ORAL

## 2019-10-25 MED ORDER — GABAPENTIN 600 MG PO TABS
300.0000 mg | ORAL_TABLET | Freq: Two times a day (BID) | ORAL | Status: DC
  Filled 2019-10-25: qty 0.5

## 2019-10-25 MED ORDER — MIDAZOLAM HCL 5 MG/5ML IJ SOLN
INTRAMUSCULAR | Status: DC | PRN
  Administered 2019-10-25: 2 mg via INTRAVENOUS

## 2019-10-25 MED ORDER — ONDANSETRON HCL 4 MG/2ML IJ SOLN
INTRAMUSCULAR | Status: AC
  Filled 2019-10-25: qty 2

## 2019-10-25 MED ORDER — SODIUM CHLORIDE 0.9 % IV BOLUS
500.0000 mL | Freq: Once | INTRAVENOUS | Status: AC
  Administered 2019-10-25: 500 mL via INTRAVENOUS

## 2019-10-25 MED ORDER — PHENYLEPHRINE HCL (PRESSORS) 10 MG/ML IV SOLN
INTRAVENOUS | Status: AC
  Filled 2019-10-25: qty 1

## 2019-10-25 SURGICAL SUPPLY — 81 items
ADH SKN CLS APL DERMABOND .7 (GAUZE/BANDAGES/DRESSINGS) ×1
AGENT HMST KT MTR STRL THRMB (HEMOSTASIS)
APL ESCP 34 STRL LF DISP (HEMOSTASIS)
APL SRG 38 LTWT LNG FL B (MISCELLANEOUS) ×1
APPLICATOR ARISTA FLEXITIP XL (MISCELLANEOUS) ×1 IMPLANT
APPLICATOR SURGIFLO ENDO (HEMOSTASIS) IMPLANT
BACTOSHIELD CHG 4% 4OZ (MISCELLANEOUS) ×1
BAG LAPAROSCOPIC 12 15 PORT 16 (BASKET) IMPLANT
BAG RETRIEVAL 12/15 (BASKET) ×2
BAG SPEC RTRVL LRG 6X4 10 (ENDOMECHANICALS)
BLADE SURG SZ10 CARB STEEL (BLADE) IMPLANT
COVER BACK TABLE 60X90IN (DRAPES) ×2 IMPLANT
COVER TIP SHEARS 8 DVNC (MISCELLANEOUS) ×1 IMPLANT
COVER TIP SHEARS 8MM DA VINCI (MISCELLANEOUS) ×1
COVER WAND RF STERILE (DRAPES) IMPLANT
DECANTER SPIKE VIAL GLASS SM (MISCELLANEOUS) IMPLANT
DERMABOND ADVANCED (GAUZE/BANDAGES/DRESSINGS) ×1
DERMABOND ADVANCED .7 DNX12 (GAUZE/BANDAGES/DRESSINGS) ×1 IMPLANT
DRAPE ARM DVNC X/XI (DISPOSABLE) ×4 IMPLANT
DRAPE COLUMN DVNC XI (DISPOSABLE) ×1 IMPLANT
DRAPE DA VINCI XI ARM (DISPOSABLE) ×4
DRAPE DA VINCI XI COLUMN (DISPOSABLE) ×1
DRAPE SHEET LG 3/4 BI-LAMINATE (DRAPES) ×2 IMPLANT
DRAPE SURG IRRIG POUCH 19X23 (DRAPES) ×2 IMPLANT
DRSG OPSITE POSTOP 4X6 (GAUZE/BANDAGES/DRESSINGS) IMPLANT
DRSG OPSITE POSTOP 4X8 (GAUZE/BANDAGES/DRESSINGS) IMPLANT
ELECT REM PT RETURN 15FT ADLT (MISCELLANEOUS) ×2 IMPLANT
GAUZE 4X4 16PLY RFD (DISPOSABLE) ×1 IMPLANT
GLOVE BIO SURGEON STRL SZ 6 (GLOVE) ×8 IMPLANT
GLOVE BIO SURGEON STRL SZ 6.5 (GLOVE) ×2 IMPLANT
GOWN STRL REUS W/ TWL LRG LVL3 (GOWN DISPOSABLE) ×4 IMPLANT
GOWN STRL REUS W/TWL LRG LVL3 (GOWN DISPOSABLE) ×8
HEMOSTAT ARISTA ABSORB 3G PWDR (HEMOSTASIS) ×1 IMPLANT
IRRIG SUCT STRYKERFLOW 2 WTIP (MISCELLANEOUS) ×2
IRRIGATION SUCT STRKRFLW 2 WTP (MISCELLANEOUS) ×1 IMPLANT
KIT PROCEDURE DA VINCI SI (MISCELLANEOUS) ×1
KIT PROCEDURE DVNC SI (MISCELLANEOUS) IMPLANT
KIT TURNOVER KIT A (KITS) IMPLANT
MANIPULATOR UTERINE 4.5 ZUMI (MISCELLANEOUS) ×2 IMPLANT
NDL HYPO 21X1.5 SAFETY (NEEDLE) IMPLANT
NDL SPNL 18GX3.5 QUINCKE PK (NEEDLE) IMPLANT
NEEDLE HYPO 21X1.5 SAFETY (NEEDLE) ×2 IMPLANT
NEEDLE HYPO 22GX1.5 SAFETY (NEEDLE) IMPLANT
NEEDLE SPNL 18GX3.5 QUINCKE PK (NEEDLE) ×2 IMPLANT
OBTURATOR OPTICAL STANDARD 8MM (TROCAR) ×1
OBTURATOR OPTICAL STND 8 DVNC (TROCAR) ×1
OBTURATOR OPTICALSTD 8 DVNC (TROCAR) ×1 IMPLANT
PACK ROBOT GYN CUSTOM WL (TRAY / TRAY PROCEDURE) ×2 IMPLANT
PACKING VAGINAL (PACKING) ×1 IMPLANT
PAD POSITIONING PINK XL (MISCELLANEOUS) ×2 IMPLANT
PENCIL SMOKE EVACUATOR (MISCELLANEOUS) IMPLANT
PORT ACCESS TROCAR AIRSEAL 12 (TROCAR) ×1 IMPLANT
PORT ACCESS TROCAR AIRSEAL 5M (TROCAR) ×1
POUCH SPECIMEN RETRIEVAL 10MM (ENDOMECHANICALS) IMPLANT
SCISSORS LAP 5X35 DISP (ENDOMECHANICALS) ×1 IMPLANT
SCRUB CHG 4% DYNA-HEX 4OZ (MISCELLANEOUS) ×1 IMPLANT
SEAL CANN UNIV 5-8 DVNC XI (MISCELLANEOUS) ×3 IMPLANT
SEAL XI 5MM-8MM UNIVERSAL (MISCELLANEOUS) ×4
SET TRI-LUMEN FLTR TB AIRSEAL (TUBING) ×2 IMPLANT
SPONGE LAP 18X18 RF (DISPOSABLE) ×1 IMPLANT
SPONGE LAP 18X18 X RAY DECT (DISPOSABLE) IMPLANT
SPONGE LAP 4X18 RFD (DISPOSABLE) ×1 IMPLANT
SURGIFLO W/THROMBIN 8M KIT (HEMOSTASIS) IMPLANT
SUT MNCRL AB 4-0 PS2 18 (SUTURE) IMPLANT
SUT PDS AB 1 TP1 96 (SUTURE) IMPLANT
SUT VIC AB 0 CT1 27 (SUTURE)
SUT VIC AB 0 CT1 27XBRD ANTBC (SUTURE) IMPLANT
SUT VIC AB 2-0 CT1 27 (SUTURE)
SUT VIC AB 2-0 CT1 TAPERPNT 27 (SUTURE) IMPLANT
SUT VIC AB 2-0 CT2 27 (SUTURE) ×1 IMPLANT
SUT VIC AB 2-0 SH 27 (SUTURE) ×2
SUT VIC AB 2-0 SH 27X BRD (SUTURE) IMPLANT
SUT VICRYL 4-0 PS2 18IN ABS (SUTURE) ×4 IMPLANT
SYR 10ML LL (SYRINGE) IMPLANT
TOWEL OR NON WOVEN STRL DISP B (DISPOSABLE) ×2 IMPLANT
TRAP SPECIMEN MUCOUS 40CC (MISCELLANEOUS) IMPLANT
TRAY FOLEY MTR SLVR 16FR STAT (SET/KITS/TRAYS/PACK) ×2 IMPLANT
TROCAR XCEL NON-BLD 5MMX100MML (ENDOMECHANICALS) IMPLANT
UNDERPAD 30X36 HEAVY ABSORB (UNDERPADS AND DIAPERS) ×2 IMPLANT
WATER STERILE IRR 1000ML POUR (IV SOLUTION) ×2 IMPLANT
YANKAUER SUCT BULB TIP 10FT TU (MISCELLANEOUS) ×1 IMPLANT

## 2019-10-25 NOTE — Transfer of Care (Signed)
Immediate Anesthesia Transfer of Care Note  Patient: Bridget Lamb  Procedure(s) Performed: XI ROBOTIC ASSISTED TOTAL HYSTERECTOMY WITH BILATERAL SALPINGO OOPHORECTOMY (N/A ) SENTINEL LYMPH NODE BIOPSY (N/A )  Patient Location: PACU  Anesthesia Type:General  Level of Consciousness: awake, alert  and oriented  Airway & Oxygen Therapy: Patient Spontanous Breathing and Patient connected to face mask oxygen  Post-op Assessment: Report given to RN and Post -op Vital signs reviewed and stable  Post vital signs: Reviewed and stable  Last Vitals:  Vitals Value Taken Time  BP 127/56 10/25/19 1201  Temp 33.8 C 10/25/19 1200  Pulse 59 10/25/19 1210  Resp 10 10/25/19 1209  SpO2 99 % 10/25/19 1210  Vitals shown include unvalidated device data.  Last Pain:  Vitals:   10/25/19 0549  TempSrc: Oral         Complications: No apparent anesthesia complications

## 2019-10-25 NOTE — Interval H&P Note (Signed)
History and Physical Interval Note:  10/25/2019 7:11 AM  Bridget Lamb  has presented today for surgery, with the diagnosis of ENDOMETRIAL CANCER.  The various methods of treatment have been discussed with the patient and family. After consideration of risks, benefits and other options for treatment, the patient has consented to  Procedure(s): XI ROBOTIC ASSISTED TOTAL HYSTERECTOMY WITH BILATERAL SALPINGO OOPHORECTOMY (N/A) SENTINEL LYMPH NODE BIOPSY (N/A) as a surgical intervention.  The patient's history has been reviewed, patient examined, no change in status, stable for surgery.  I have reviewed the patient's chart and labs.  Questions were answered to the patient's satisfaction.     Lafonda Mosses

## 2019-10-25 NOTE — Treatment Plan (Signed)
Called patient's grandson to update him about surgery. NO answer and voicemail full, unable to leave message.  KTucker MD

## 2019-10-25 NOTE — Brief Op Note (Signed)
10/25/2019  11:44 AM  PATIENT:  Bridget Lamb  63 y.o. female  PRE-OPERATIVE DIAGNOSIS:  ENDOMETRIAL CANCER  POST-OPERATIVE DIAGNOSIS:  ENDOMETRIAL CANCER  PROCEDURE:  Procedure(s): XI ROBOTIC ASSISTED TOTAL HYSTERECTOMY WITH BILATERAL SALPINGO OOPHORECTOMY (N/A) SENTINEL LYMPH NODE BIOPSY (N/A)  SURGEON:  Surgeon(s) and Role:    * Lafonda Mosses, MD - Primary    * Lahoma Crocker, MD - Assisting   ANESTHESIA:   general  EBL:  350 mL   BLOOD ADMINISTERED:none  DRAINS: none   LOCAL MEDICATIONS USED:  BUPIVICAINE   SPECIMEN:  Uterus, cervix, bilateral adnexa, L&R external iliac sentinel lymph nodes  DISPOSITION OF SPECIMEN:  PATHOLOGY  COUNTS:  YES  TOURNIQUET:  * No tourniquets in log *  DICTATION: .Note written in EPIC  PLAN OF CARE: Admit to inpatient   PATIENT DISPOSITION:  PACU - hemodynamically stable.   Delay start of Pharmacological VTE agent (>24hrs) due to surgical blood loss or risk of bleeding: no

## 2019-10-25 NOTE — Anesthesia Postprocedure Evaluation (Signed)
Anesthesia Post Note  Patient: Bridget Lamb  Procedure(s) Performed: XI ROBOTIC ASSISTED TOTAL HYSTERECTOMY WITH BILATERAL SALPINGO OOPHORECTOMY (N/A ) SENTINEL LYMPH NODE BIOPSY (N/A )     Patient location during evaluation: PACU Anesthesia Type: General Level of consciousness: awake and alert Pain management: pain level controlled Vital Signs Assessment: post-procedure vital signs reviewed and stable Respiratory status: spontaneous breathing, nonlabored ventilation, respiratory function stable and patient connected to nasal cannula oxygen Cardiovascular status: blood pressure returned to baseline and stable Postop Assessment: no apparent nausea or vomiting Anesthetic complications: no    Last Vitals:  Vitals:   10/25/19 1315 10/25/19 1330  BP: 134/68 (!) 142/72  Pulse: 68 71  Resp: 11 14  Temp: (!) 34.4 C   SpO2: 100% 99%    Last Pain:  Vitals:   10/25/19 1330  TempSrc:   PainSc: Asleep                 Akisha Sturgill DANIEL

## 2019-10-25 NOTE — Interval H&P Note (Signed)
History and Physical Interval Note:  10/25/2019 7:12 AM  Bridget Lamb  has presented today for surgery, with the diagnosis of ENDOMETRIAL CANCER.  The various methods of treatment have been discussed with the patient and family. After consideration of risks, benefits and other options for treatment, the patient has consented to  Procedure(s): XI ROBOTIC ASSISTED TOTAL HYSTERECTOMY WITH BILATERAL SALPINGO OOPHORECTOMY (N/A) SENTINEL LYMPH NODE BIOPSY (N/A) as a surgical intervention.  The patient's history has been reviewed, patient examined, no change in status, stable for surgery.  I have reviewed the patient's chart and labs.  Questions were answered to the patient's satisfaction.     Lafonda Mosses

## 2019-10-25 NOTE — Op Note (Addendum)
OPERATIVE NOTE  Surgeon: Jeral Pinch MD   Assistants: Dr Lahoma Crocker (an MD assistant was necessary for tissue manipulation, management of robotic instrumentation, retraction and positioning due to the complexity of the case and hospital policies).   Anesthesia: General endotracheal anesthesia  Pre-operative Diagnosis: endometrial cancer, high grade  Post-operative Diagnosis: same  Operation: Robotic-assisted laparoscopic total hysterectomy with bilateral salpingoophorectomy, SLN biopsy, repair of right sulcal vaginal tear  Modifier 22: obesity, BMI 44, with significant retroperitoneal and intraperitoneal adiposity. Obesity necessitated an additional 30 minutes of operating time to create safe exposure. Obesity required additional personnel in the operating room for positioning and retraction. Severe obesity substantially increased the complexity of the procedure.   Urine Output: 100cc  Operative Findings: On EUA, small cervix, moderately mobile uterus, no adnexal masses.  On intra-abdominal entry, adhesions from the omentum to the anterior wall at the patient's prior hernia repair as well as to the lateral anterior wall bilaterally.  Right aspect of the liver and diaphragm of secured by adhesions.  Left liver edge, diaphragm and stomach normal in appearance.  Uterus 10 cm in bulbous.  Posterior cul-de-sac completely obliterated and bilateral fallopian tubes and adnexa adherent to posterior uterus and sigmoid mesentery.  Fallopian tubes and left ovary somewhat concerning for involvement by tumor versus prior history of pelvic infection or endometriosis.  Mapping successful to bilateral external iliac sentinel lymph nodes.  Small and large bowel normal appearing.  No intra-abdominal evidence of disease.  Significant right sulcal tear noted after delivery of specimen through the vagina, repaired robotically as well as vaginally.  Estimated Blood Loss:  350 cc  Total IV Fluids: 1,000  ml         Specimens: uterus, cervix, bilateral tubes and ovaries, bilateral external iliac sentinel lymph nodes         Complications:  None apparent; patient tolerated the procedure well.         Disposition: PACU - hemodynamically stable.  Procedure Details  The patient was seen in the Holding Room. The risks, benefits, complications, treatment options, and expected outcomes were discussed with the patient.  The patient concurred with the proposed plan, giving informed consent.  The site of surgery properly noted/marked. The patient was identified as Bridget Lamb and the procedure verified as a Robotic-assisted hysterectomy with bilateral salpingo oophorectomy with SLN biopsy. A Time Out was held and the above information confirmed.  After induction of anesthesia, the patient was draped and prepped in the usual sterile manner. Pt was placed in supine position after anesthesia and draped and prepped in the usual sterile manner. The abdominal drape was placed after the CholoraPrep had been allowed to dry for 3 minutes.  Her arms were tucked to her side with all appropriate precautions.  The shoulders were stabilized with padded shoulder blocks applied to the acromium processes.  The patient was placed in the semi-lithotomy position in [place].  The perineum was prepped with Betadine. The patient was then prepped. Foley catheter was placed.  A sterile speculum was placed in the vagina.  The cervix was grasped with a single-tooth tenaculum. 2mg  total of ICG was injected into the cervical stroma at 2 and 9 o'clock with 1cc injected at a 1cm and 34mm depth (concentration 0.5mg /ml) in all locations. The cervix was dilated with Kennon Rounds dilators.  The ZUMI uterine manipulator with a small colpotomizer ring was placed without difficulty.  OG tube placement was confirmed and to suction.   Next, a 10 mm skin incision  was made 1 cm below the subcostal margin in the midclavicular line.  The 5 mm Optiview  port and scope was used for direct entry.  Opening pressure was under 10 mm CO2.  The abdomen was insufflated and the findings were noted as above.   At this point and all points during the procedure, the patient's intra-abdominal pressure did not exceed 15 mmHg. Next, a 10 mm skin incision was made above the umbilicus and avoiding prior incisional hernia repair, and a right and left port was placed about 8 cm lateral to the robot port on the right and left side.  A fourth arm was placed in the left abdomen after laparoscopic scissors were used to sharply lyse filmy adhesions of the omentum to the left anterior abdominal wall.  The Optiview port was exchanged for a 12 mm air seal port.  All ports were placed under direct visualization.  The patient was placed in steep Trendelenburg.  The remaining adhesions of the omentum to the anterior abdominal wall at the prior incisional hernia repair were lysed sharply and with monopolar electrocautery.  The bowel was folded away into the upper abdomen.  The robot was docked in the normal manner.  The right and left round ligaments were transected and right and left peritoneum were opened parallel to the IP ligament to open the retroperitoneal spaces bilaterally. The SLN mapping was performed in bilateral pelvic basins. The para rectal and paravesical spaces were opened up entirely with careful dissection below the level of the ureters bilaterally and to the depth of the uterine artery origin in order to skeletonize the uterine "web" and ensure visualization of all parametrial channels. The para-aortic basins were carefully exposed and evaluated for isolated para-aortic SLN's. Lymphatic channels were identified travelling to the following visualized sentinel lymph node's: Bilateral external iliac. These SLN's were separated from their surrounding lymphatic tissue, removed and sent for permanent pathology.  Attention was then turned to the hysterectomy.  Given adhesions of  bilateral adnexa to the sigmoid mesentery and obliteration of the cul-de-sac, sharp dissection was first used to dissect the fallopian tubes and ovaries free.  The ureter was again noted to be on the medial leaf of the broad ligament.  The peritoneum above the ureter was incised and stretched and the infundibulopelvic ligament was skeletonized, cauterized and cut.  This was performed on the left and right.  Further sharp and blunt dissection was used to create a plane between the uterus and sigmoid mesentery.  Given difficulty identifying the KOH ring posteriorly, attention was turned anteriorly.  The anterior peritoneum was taken down and the bladder flap created to the level of the KOH ring.  After identification of the ring, the posterior peritoneum was taken down to the level of the KOH ring. The uterine artery on the right side was skeletonized, cauterized and cut in the normal manner.  A similar procedure was performed on the left.  The colpotomy was made and the uterus, cervix, bilateral ovaries and tubes were amputated and delivered through the vagina after being placed in a 50 mm spleen bag secondary to their size.  Pedicles were inspected and excellent hemostasis was achieved.  There was significant bleeding from a right sulcal laceration.  A 2-0 Vicryl was used to achieve hemostasis and repair the superior aspect of this tear robotically.  The colpotomy at the vaginal cuff was closed with Vicryl on a CT1 needle in a running manner.  Irrigation was used and excellent hemostasis was achieved.  Given dissection required due to obliterated cul de sac, Arista was placed since tissue surfaces somewhat raw and oozing during surgery. At this point in the procedure was completed.  Robotic instruments were removed under direct visulaization.  The robot was undocked. The 10 mm ports were closed with Vicryl on a UR-5 needle and the fascia was closed with 0 Vicryl on a UR-5 needle.  The subcutaneous tissue was closed  with 4-0 Vicryl and the skin closed with 4-0 Monocryl in a subcuticular manner.  Dermabond was applied.    Attention was then turned to the vagina secondary to bleeding noted midway through the cuff closure.  The inferior aspect of the sulcal laceration was noted to be bleeding and several interrupted stitches with 2-0 Vicryl were used to achieve hemostasis.  Given multiple abrasions in the vagina, the decision was made to leave the Foley catheter in and packed the vagina with Kerlix.  Sponge, lap and needle counts correct x 2.  The patient was taken to the recovery room in stable condition.  Jeral Pinch MD

## 2019-10-25 NOTE — Anesthesia Procedure Notes (Signed)
Procedure Name: Intubation Date/Time: 10/25/2019 7:37 AM Performed by: [place] Joana Nolton C, CRNA Pre-anesthesia Checklist: Patient identified, Emergency Drugs available, Suction available and Patient being monitored Patient Re-evaluated:Patient Re-evaluated prior to induction Oxygen Delivery Method: Circle system utilized Preoxygenation: Pre-oxygenation with 100% oxygen Induction Type: IV induction Ventilation: Mask ventilation without difficulty Laryngoscope Size: Mac and 3 Grade View: Grade I Tube type: Oral Number of attempts: 1 Airway Equipment and Method: Stylet and Oral airway Placement Confirmation: ETT inserted through vocal cords under direct vision,  positive ETCO2 and breath sounds checked- equal and bilateral Secured at: 21 cm Tube secured with: Tape Dental Injury: Teeth and Oropharynx as per pre-operative assessment

## 2019-10-26 DIAGNOSIS — C541 Malignant neoplasm of endometrium: Secondary | ICD-10-CM | POA: Diagnosis not present

## 2019-10-26 LAB — BASIC METABOLIC PANEL
Anion gap: 8 (ref 5–15)
BUN: 23 mg/dL (ref 8–23)
CO2: 25 mmol/L (ref 22–32)
Calcium: 8.4 mg/dL — ABNORMAL LOW (ref 8.9–10.3)
Chloride: 102 mmol/L (ref 98–111)
Creatinine, Ser: 1.58 mg/dL — ABNORMAL HIGH (ref 0.44–1.00)
GFR calc Af Amer: 40 mL/min — ABNORMAL LOW (ref >60)
GFR calc non Af Amer: 34 mL/min — ABNORMAL LOW (ref >60)
Glucose, Bld: 136 mg/dL — ABNORMAL HIGH (ref 70–99)
Potassium: 5.1 mmol/L (ref 3.5–5.1)
Sodium: 135 mmol/L (ref 135–145)

## 2019-10-26 LAB — CBC
HCT: 28.2 % — ABNORMAL LOW (ref 36.0–46.0)
Hemoglobin: 8.6 g/dL — ABNORMAL LOW (ref 12.0–15.0)
MCH: 32 pg (ref 26.0–34.0)
MCHC: 30.5 g/dL (ref 30.0–36.0)
MCV: 104.8 fL — ABNORMAL HIGH (ref 80.0–100.0)
Platelets: 184 10*3/uL (ref 150–400)
RBC: 2.69 MIL/uL — ABNORMAL LOW (ref 3.87–5.11)
RDW: 12.4 % (ref 11.5–15.5)
WBC: 13.6 10*3/uL — ABNORMAL HIGH (ref 4.0–10.5)
nRBC: 0 % (ref 0.0–0.2)

## 2019-10-26 MED ORDER — CHLORHEXIDINE GLUCONATE CLOTH 2 % EX PADS: 6.0000 | MEDICATED_PAD | Freq: Every day | CUTANEOUS | Status: DC

## 2019-10-26 MED ORDER — HEPARIN SODIUM (PORCINE) 5000 UNIT/ML IJ SOLN
5000.0000 [IU] | Freq: Three times a day (TID) | INTRAMUSCULAR | Status: DC
  Administered 2019-10-26: 5000 [IU] via SUBCUTANEOUS
  Filled 2019-10-26: qty 1

## 2019-10-26 NOTE — Discharge Summary (Signed)
Physician Discharge Summary  Patient ID: Bridget Lamb MRN: [id number] DOB/AGE: [birth year] 64 y.o.  Admit date: 10/25/2019 Discharge date: 10/26/2019  Admission Diagnoses: High grade endometrial cancer  Discharge Diagnoses:  Active Problems:   Endometrial cancer [facility])   Discharged Condition: good  Hospital Course:  1/ patient was admitted on 12/31 for post-operative surveillance after robotic endometrial cancer staging due to vaginal laceration requiring repair and vaginal packing. 2/ surgery was otherwise uncomplicated.  3/ on postoperative day, the patient's packing and foley catheter were removed. The patient did not have vaginal bleeding after packing removal. On POD#1, the patient was meeting discharge criteria: tolerating PO, voiding urine, ambulating, pain well controlled on oral medications.  4/ new medications on discharge include oxycodone, Senakot, and ibuprofen (all prescribed at preoperative visit).   Consults: None  Significant Diagnostic Studies: labs: CBC and BMP significant for acute on chronic anemia secondary to surgical blood loss, mild increase in creatinine.  Treatments: IV hydration  Discharge Exam: Blood pressure (!) 126/58, pulse 72, temperature (!) 97.5 F (36.4 C), temperature source Oral, resp. rate 18, height 5\' 4"  (1.626 m), weight 254 lb 3.2 oz (115.3 kg), last menstrual period 10/22/2011, SpO2 100 %. General: No acute distress, alert and oriented HEENT: Atraumatic, normocephalic, no scleral icterus Cardiovascular: Regular rate and rhythm, no murmurs rubs or gallops appreciated Pulmonary: Mild expiratory wheezing, good airflow and lungs clear to auscultation bilaterally otherwise.  No rhonchi Abdomen: Obese, normoactive bowel sounds, appropriately tender around incisions, incisions clean dry and intact with Dermabond in place GU: Packing removed with no active bleeding noticed and older appearing blood soaked gauze.  Foley catheter also  removed Extremities: Warm and well perfused, trace edema  Disposition: Discharge disposition: 01-Home or Self Care        Allergies as of 10/26/2019   No Known Allergies     Medication List    TAKE these medications   albuterol (2.5 MG/3ML) 0.083% nebulizer solution Commonly known as: PROVENTIL Take 2.5 mg by nebulization every 6 (six) hours as needed for wheezing or shortness of breath.   albuterol 108 (90 Base) MCG/ACT inhaler Commonly known as: VENTOLIN HFA Inhale 2 puffs into the lungs every 4 (four) hours as needed for wheezing or shortness of breath.   aspirin EC 81 MG tablet Take 81 mg by mouth daily.   CALCIUM 600+D PO Take 1 tablet by mouth daily.   INTEGRA PO Take 1 tablet by mouth daily.   levothyroxine 150 MCG tablet Commonly known as: SYNTHROID Take 150 mcg by mouth daily before breakfast.   lisinopril 20 MG tablet Commonly known as: ZESTRIL Take 20 mg by mouth daily.   loratadine 10 MG tablet Commonly known as: CLARITIN Take 10 mg by mouth every morning.   NIFEdipine 30 MG 24 hr tablet Commonly known as: PROCARDIA-XL/NIFEDICAL-XL Take 30 mg by mouth daily.   olopatadine 0.1 % ophthalmic solution Commonly known as: PATANOL Place 1 drop into both eyes 2 (two) times daily.   oxyCODONE 5 MG immediate release tablet Commonly known as: Oxy IR/ROXICODONE Take 1 tablet (5 mg total) by mouth every 4 (four) hours as needed for severe pain. For AFTER surgery only, do not take and drive   rosuvastatin 10 MG tablet Commonly known as: CRESTOR Take 10 mg by mouth at bedtime.   senna-docusate 8.6-50 MG tablet Commonly known as: Senokot-S Take 2 tablets by mouth at bedtime. For AFTER surgery, do not take if having diarrhea      Follow-up Information  Lafonda Mosses, MD In 1 week.   Specialty: Gynecologic Oncology Why: phone visit in 1 week and clinic f/u in 3 weeks, already scheduled Contact information: [address] [place]  [id number] [phone]           Signed: Lafonda Mosses 10/26/2019, 5:33 PM

## 2019-10-26 NOTE — Progress Notes (Signed)
Discharge instructions given to pt and all questions were answered.  

## 2019-10-26 NOTE — Discharge Instructions (Signed)
10/25/2019  Return to work: 6 weeks  Your prescriptions were sent to your pharmacy prior to surgery when we saw you in clinic initially  Activity: 1. Be up and out of the bed during the day.  Take a nap if needed.  You may walk up steps but be careful and use the hand rail.  Stair climbing will tire you more than you think, you may need to stop part way and rest.   2. No lifting or straining for 6 weeks.  3. No driving for 1-2 weeks and until you feel that you can break safely.  Do Not drive if you are taking narcotic pain medicine.  4. Shower daily.  Use soap and water on your incision and pat dry; don't rub.   5. No sexual activity and nothing in the vagina for 8 weeks.  Medications:  - Take ibuprofen and tylenol first line for pain control. Take these regularly (every 6 hours) to decrease the build up of pain.  - If necessary, for severe pain not relieved by ibuprofen, take percocet.  - While taking percocet you should take sennakot every night to reduce the likelihood of constipation. If this causes diarrhea, stop its use.  Diet: 1. Low sodium Heart Healthy Diet is recommended.  2. It is safe to use a laxative if you have difficulty moving your bowels.   Wound Care: 1. Keep clean and dry.  Shower daily.  Reasons to call the Doctor:   Fever - Oral temperature greater than 100.4 degrees Fahrenheit  Foul-smelling vaginal discharge  Difficulty urinating  Nausea and vomiting  Increased pain at the site of the incision that is unrelieved with pain medicine.  Difficulty breathing with or without chest pain  New calf pain especially if only on one side  Sudden, continuing increased vaginal bleeding with or without clots.   Follow-up: 1. See Dr. Berline Lopes in 3 weeks.  Contacts: For questions or concerns you should contact:  Dr. Jeral Pinch at [phone] After hours and on week-ends call [phone] and ask to speak to the physician on call for Gynecologic  Oncology

## 2019-10-26 NOTE — Progress Notes (Signed)
MD called to confirm if foley needed to stay in place or be d/c. Foley will remain.

## 2019-10-26 NOTE — Progress Notes (Signed)
1 Day Post-Op Procedure(s) (LRB): XI ROBOTIC ASSISTED TOTAL HYSTERECTOMY WITH BILATERAL SALPINGO OOPHORECTOMY (N/A) SENTINEL LYMPH NODE BIOPSY (N/A)  Subjective: Patient reports doing well overnight.  She endorses minimal pain.  She is tolerating p.o. intake without nausea or emesis.  She reports feeling steady on her feet and ambulated last night and again this morning.  She had minimal vaginal bleeding through the packing last night.  Objective: Vital signs in last 24 hours: Temp:  [92.8 F (33.8 C)-98.2 F (36.8 C)] 98 F (36.7 C) (01/01 0634) Pulse Rate:  [61-87] 87 (01/01 0634) Resp:  [11-20] 17 (01/01 0634) BP: (127-147)/(56-79) 140/64 (01/01 0634) SpO2:  [91 %-100 %] 92 % (01/01 0634) Last BM Date: 10/22/20  Intake/Output from previous day: 12/31 0701 - 01/01 0700 In: 4052.3 [P.O.:840; I.V.:2791.7; IV Piggyback:420.6] Out: 2015 [Urine:1665; Blood:350]  Physical Examination: General: No acute distress, alert and oriented HEENT: Atraumatic, normocephalic, no scleral icterus Cardiovascular: Regular rate and rhythm, no murmurs rubs or gallops appreciated Pulmonary: Mild expiratory wheezing, good airflow and lungs clear to auscultation bilaterally otherwise.  No rhonchi Abdomen: Obese, normoactive bowel sounds, appropriately tender around incisions, incisions clean dry and intact with Dermabond in place GU: Packing removed with no active bleeding noticed and older appearing blood soaked gauze.  Foley catheter also removed Extremities: Warm and well perfused, trace edema  Labs: WBC/Hgb/Hct/Plts:  13.6/8.6/28.2/184 (01/01 0356)  BMP pending this morning  Assessment:  64 y.o. s/p Procedure(s): XI ROBOTIC ASSISTED TOTAL HYSTERECTOMY WITH BILATERAL SALPINGO OOPHORECTOMY SENTINEL LYMPH NODE BIOPSY: Meeting early postoperative milestones Pain:  Pain  well-controlled on oral medications.  Heme: Acute on chronic anemia, asymptomatic.  Courage patient to ambulate now that packing  is out as well as Foley catheter so that we can assess for any significant bleeding prior to discharge.  CV: History of hypertension and congestive heart failure, no acute concerns.  GI:  Tolerating po. FEN: Continue regular diet, will discontinue IV fluids.  Awaiting BMP this morning.  Prophylaxis: Heparin ordered given kidney function.  Plan: Awaiting spontaneous void and assessment of vaginal bleeding after removal of packing this morning. Dispo: Anticipate discharge later today The patient is to be discharged to home.   LOS: 0 days    Bridget Lamb 10/26/2019, 9:42 AM

## 2019-10-29 ENCOUNTER — Telehealth: Payer: Self-pay

## 2019-10-29 ENCOUNTER — Other Ambulatory Visit: Payer: Self-pay

## 2019-10-29 NOTE — Telephone Encounter (Signed)
Pt inquired to see if FMLA Papers had been received.

## 2019-10-29 NOTE — Progress Notes (Signed)
Gynecologic Oncology Telehealth Consult Note: Gyn-Onc  I connected with Bridget Lamb on 10/30/19 at 10:10AM EST by telephone and verified that I am speaking with the correct person using two identifiers.  I discussed the limitations, risks, security and privacy concerns of performing an evaluation and management service by telemedicine and the availability of in-person appointments. I also discussed with the patient that there may be a patient responsible charge related to this service. The patient expressed understanding and agreed to proceed.  Other persons participating in the visit and their role in the encounter: None.  Patient's location: Home Provider's location: [facility]  Reason for Visit: Postoperative follow-up and review of recent pathology from endometrial cancer staging surgery  Treatment History: Oncology History Overview Note  CA-125  10/12/19: 48.9   Endometrial cancer ([place])  09/19/2019 Initial Biopsy   EMB: EMCA with clear cell features   09/19/2019 Initial Diagnosis   Endometrial cancer ([place])   10/17/2019 Imaging   CT A/P: Mild endometrial thickening in this patient with known endometrial cancer. No evidence of metastatic disease in the abdomen/pelvis.  CXR 12/28: No evidence of acute cardiopulmonary abnormality.   10/25/2019 Surgery   TRH/BSO, bil SLN, repair of sulcal tear Findings: On EUA, small cervix, moderately mobile uterus, no adnexal masses.  On intra-abdominal entry, adhesions from the omentum to the anterior wall at the patient's prior hernia repair as well as to the lateral anterior wall bilaterally.  Right aspect of the liver and diaphragm of secured by adhesions.  Left liver edge, diaphragm and stomach normal in appearance.  Uterus 10 cm in bulbous.  Posterior cul-de-sac completely obliterated and bilateral fallopian tubes and adnexa adherent to posterior uterus and sigmoid mesentery.  Fallopian tubes and left ovary somewhat  concerning for involvement by tumor versus prior history of pelvic infection or endometriosis.  Mapping successful to bilateral external iliac sentinel lymph nodes.  Small and large bowel normal appearing.  No intra-abdominal evidence of disease.  Significant right sulcal tear noted after delivery of specimen through the vagina, repaired robotically as well as vaginally.   10/25/2019 Cancer Staging   Cancer Staging Endometrial cancer [facility]) Staging form: Corpus Uteri - Adenosarcoma, AJCC 8th Edition - Clinical: FIGO Stage IA (cT1a, cN0, cM0) - Unsigned MI <50%, no LVSI, negative SLNs   10/30/2019 Cancer Staging   Staging form: Corpus Uteri - Adenosarcoma, AJCC 8th Edition - Clinical: FIGO Stage IA (cT1a, cN0, cM0) - Signed by Lafonda Mosses, MD on 10/30/2019     Interval History: Patient reports overall doing very well since she has been home.  She has had to take medicine once for pain but otherwise reports minimal abdominal and pelvic pain.  She has had some difficulty getting out of bed the last few days but feels like this is getting easier.  She is increasing her ambulation daily.  She reports mildly decreased appetite but denies any nausea or emesis.  She is voiding without difficulty and has had multiple bowel movements since her discharge from the hospital.  She has been taking Senokot to help with regularity.  She reports some mild vaginal spotting since discharge which is now decreased to a small spot here and there.  Denies any fevers or chills.  Past Medical/Surgical History: Past Medical History:  Diagnosis Date  . Allergic rhinitis   . Anemia    hematology, Dr. Julien Nordmann, last 01/2011  . Asthma   . CHF (congestive heart failure) ([place]) 2004  . Chronic kidney disease  protein in urine  . Complication of anesthesia   . Dyslipidemia   . Dyspnea    on excertion  . Endometrial cancer ([place])   . H/O echocardiogram 01/07/04   mild LVH, nl LV systolic function, EF 67%, left atrial  enlargement, mildly thickened aortic and mitral valves  . History of thyroid cancer   . Hypertension   . Incarcerated ventral hernia 10/07/2013  . Obesity, Class III, BMI 40-49.9 (morbid obesity) ([place]) 10/09/2013  . Osteopenia   . PONV (postoperative nausea and vomiting)   . Umbilical hernia   . Unspecified essential hypertension 10/09/2013  . Unspecified hypothyroidism 10/09/2013    Past Surgical History:  Procedure Laterality Date  . BONE MARROW BIOPSY    . CARDIAC CATHETERIZATION  2005  . CHOLECYSTECTOMY  2003  . COLONOSCOPY  2008  . ROBOTIC ASSISTED TOTAL HYSTERECTOMY WITH BILATERAL SALPINGO OOPHERECTOMY N/A 10/25/2019   Procedure: XI ROBOTIC ASSISTED TOTAL HYSTERECTOMY WITH BILATERAL SALPINGO OOPHORECTOMY;  Surgeon: Lafonda Mosses, MD;  Location: [facility];  Service: Gynecology;  Laterality: N/A;  . SENTINEL NODE BIOPSY N/A 10/25/2019   Procedure: SENTINEL LYMPH NODE BIOPSY;  Surgeon: Lafonda Mosses, MD;  Location: [facility];  Service: Gynecology;  Laterality: N/A;  . THYROID SURGERY     partial thyroidectomy  . TOTAL THYROIDECTOMY  2009  . VENTRAL HERNIA REPAIR N/A 10/07/2013   Procedure: HERNIA REPAIR VENTRAL ADULT;  Surgeon: Gwenyth Ober, MD;  Location: [facility];  Service: General;  Laterality: N/A;    Family History  Problem Relation Age of Onset  . Hypertension Mother   . Pancreatic cancer Mother   . Breast cancer Maternal Aunt   . Lung cancer Father   . Ovarian cancer Neg Hx   . Endometrial cancer Neg Hx   . Colon cancer Neg Hx     Social History   Socioeconomic History  . Marital status: Divorced    Spouse name: Not on file  . Number of children: 1  . Years of education: Not on file  . Highest education level: Not on file  Occupational History  . Not on file  Tobacco Use  . Smoking status: Never Smoker  . Smokeless tobacco: Never Used  Substance and Sexual Activity  . Alcohol use: No  . Drug use: No  . Sexual activity: Not on file  Other Topics  Concern  . Not on file  Social History Narrative   Works at [employer] at [employer], is on her feet for 8-hour shifts   Social Determinants of Health   Financial Resource Strain:   . Difficulty of Paying Living Expenses: Not on file  Food Insecurity:   . Worried About Charity fundraiser in the Last Year: Not on file  . Ran Out of Food in the Last Year: Not on file  Transportation Needs:   . Lack of Transportation (Medical): Not on file  . Lack of Transportation (Non-Medical): Not on file  Physical Activity:   . Days of Exercise per Week: Not on file  . Minutes of Exercise per Session: Not on file  Stress:   . Feeling of Stress : Not on file  Social Connections:   . Frequency of Communication with Friends and Family: Not on file  . Frequency of Social Gatherings with Friends and Family: Not on file  . Attends Religious Services: Not on file  . Active Member of Clubs or Organizations: Not on file  . Attends Archivist Meetings:  Not on file  . Marital Status: Not on file    Current Medications:  Current Outpatient Medications:  .  albuterol (PROVENTIL HFA;VENTOLIN HFA) 108 (90 Base) MCG/ACT inhaler, Inhale 2 puffs into the lungs every 4 (four) hours as needed for wheezing or shortness of breath., Disp: 1 Inhaler, Rfl: 0 .  albuterol (PROVENTIL) (2.5 MG/3ML) 0.083% nebulizer solution, Take 2.5 mg by nebulization every 6 (six) hours as needed for wheezing or shortness of breath., Disp: , Rfl:  .  aspirin EC 81 MG tablet, Take 81 mg by mouth daily., Disp: , Rfl:  .  Calcium Carbonate-Vitamin D (CALCIUM 600+D PO), Take 1 tablet by mouth daily., Disp: , Rfl:  .  Fe Fum-FePoly-Vit C-Vit B3 (INTEGRA PO), Take 1 tablet by mouth daily., Disp: , Rfl:  .  levothyroxine (SYNTHROID, LEVOTHROID) 150 MCG tablet, Take 150 mcg by mouth daily before breakfast. , Disp: , Rfl:  .  lisinopril (PRINIVIL,ZESTRIL) 20 MG tablet, Take 20 mg by mouth daily., Disp: , Rfl:  .   loratadine (CLARITIN) 10 MG tablet, Take 10 mg by mouth every morning., Disp: , Rfl:  .  NIFEdipine (PROCARDIA-XL/ADALAT-CC/NIFEDICAL-XL) 30 MG 24 hr tablet, Take 30 mg by mouth daily., Disp: , Rfl:  .  olopatadine (PATANOL) 0.1 % ophthalmic solution, Place 1 drop into both eyes 2 (two) times daily., Disp: , Rfl:  .  oxyCODONE (OXY IR/ROXICODONE) 5 MG immediate release tablet, Take 1 tablet (5 mg total) by mouth every 4 (four) hours as needed for severe pain. For AFTER surgery only, do not take and drive, Disp: 10 tablet, Rfl: 0 .  rosuvastatin (CRESTOR) 10 MG tablet, Take 10 mg by mouth at bedtime., Disp: , Rfl:  .  senna-docusate (SENOKOT-S) 8.6-50 MG tablet, Take 2 tablets by mouth at bedtime. For AFTER surgery, do not take if having diarrhea, Disp: 60 tablet, Rfl: 1  Review of Symptoms: Review of systems negative other than pertinent positives as listed in the HPI  Physical Exam: LMP 10/22/2011  Deferred given limitations of phone visit  Laboratory & Radiologic Studies: FINAL MICROSCOPIC DIAGNOSIS:   A. LYMPH NODE, RIGHT EXTERNAL ILIAC SENTINEL, BIOPSY:  - One of one lymph nodes negative for carcinoma (0/1).   B. LYMPH NODE, LEFT EXTERNAL ILIAC SENTINEL, BIOPSY:  - One of one lymph nodes negative for carcinoma (0/1).   C. UTERUS, CERVIX, BILATERAL FALLOPIAN TUBES AND OVARIES:  - Uterus:    Endomyometrium: Endometrioid adenocarcinoma, FIGO grade I, spanning  3.5 cm.       Tumor invades less than one half of the myometrium.       Adenomyosis.       Leiomyoma.       See oncology table.    Serosa: Unremarkable. No malignancy.  - Cervix: Benign squamous and endocervical mucosa. No dysplasia or  malignancy.  - Bilateral ovaries: Unremarkable. No malignancy.  - Bilateral fallopian tubes: Paratubal cysts. No malignancy.   ONCOLOGY TABLE:   UTERUS, CARCINOMA OR CARCINOSARCOMA   Procedure: Total hysterectomy and bilateral salpingo-oophorectomy  Histologic  type: Endometrioid adenocarcinoma  Histologic Grade: FIGO grade 1  Myometrial invasion:    Depth of invasion: 7 mm    Myometrial thickness: 22 mm  Uterine Serosa Involvement: Not identified  Cervical stromal involvement: Not identified  Extent of involvement of other organs: Not identified  Lymphovascular invasion: Not identified  Regional Lymph Nodes:    Examined:   2 Sentinel  0 non-sentinel                2 total     Lymph nodes with metastasis: 0     Isolated tumor cells (<0.2 mm): 0     Micrometastasis: (>0.2 mm and < 2.0 mm): 0     Macrometastasis: (>2.0 mm): 0     Extracapsular extension: Not applicable  Representative Tumor Block: C3  MMR / MSI testing: Will be ordered  Pathologic Stage Classification (pTNM, AJCC 8th edition): pT1a, pN0  Comments: Pancytokeratin performed on the lymph nodes is negative.   Assessment & Plan: Bridget Lamb is a 64 y.o. woman with Stage 1A FIGO grade 1 endometrioid adenocarcinoma of the endometrium, low risk.  Patient is overall doing very well from surgery and meeting postoperative milestones.  We discussed her pathology in detail.  She has low risk disease with stage Ia grade 1 endometrioid adenocarcinoma.  Sentinel lymph nodes were negative for metastatic disease and no LVSI was present.  Given this, we discussed that recommendation is for close observation and that surgery is considered definitive treatment.  Patient is scheduled to see me for a visit in the office on the 21st of this month.  We reviewed postoperative restrictions.  Plan will be for every 32-monthvisits for the first 2 years and then yearly visits.  I discussed the assessment and treatment plan with the patient. The patient was provided with an opportunity to ask questions and all were answered. The patient agreed with the plan and demonstrated an understanding of the instructions.   The patient was  advised to call back or see an in-person evaluation if the symptoms worsen or if the condition fails to improve as anticipated.   12 minutes of total time was spent for this patient encounter, including preparation, face-to-face counseling with the patient and coordination of care, and documentation of the encounter.   KJeral Pinch MD  Division of Gynecologic Oncology  Department of Obstetrics and Gynecology  [facility]

## 2019-10-29 NOTE — Telephone Encounter (Signed)
Told Bridget Lamb that our office has not received her FMLA  papers yet. She will call her employer to have the FMLA papers sent. She was out of work beginning 10-22-19 due to covid testing on 10-22-19. She will return to work on Monday 12-10-19.

## 2019-10-29 NOTE — Telephone Encounter (Signed)
Ms Reinitz said that she is eating, drinking, and  urinating well.  She has been moving bowels. She is up and about. Slow moving.   She is has only needed the oxycodone x 1 since surgery. Incisions are D&I. Pt  aware of appointments and office number if she has any questions or concerns.

## 2019-10-30 ENCOUNTER — Inpatient Hospital Stay: Payer: BC Managed Care – PPO | Attending: Gynecologic Oncology | Admitting: Gynecologic Oncology

## 2019-10-30 ENCOUNTER — Encounter: Payer: Self-pay | Admitting: Gynecologic Oncology

## 2019-10-30 DIAGNOSIS — C541 Malignant neoplasm of endometrium: Secondary | ICD-10-CM

## 2019-11-07 ENCOUNTER — Telehealth: Payer: Self-pay | Admitting: *Deleted

## 2019-11-07 NOTE — Telephone Encounter (Signed)
Returned the patient's call and explained that we have faxed her paperwork to [place] on 1/7

## 2019-11-15 ENCOUNTER — Inpatient Hospital Stay (HOSPITAL_BASED_OUTPATIENT_CLINIC_OR_DEPARTMENT_OTHER): Payer: BC Managed Care – PPO | Admitting: Gynecologic Oncology

## 2019-11-15 ENCOUNTER — Other Ambulatory Visit: Payer: Self-pay

## 2019-11-15 ENCOUNTER — Encounter: Payer: Self-pay | Admitting: Gynecologic Oncology

## 2019-11-15 VITALS — BP 140/68 | HR 77 | Temp 98.7°F | Resp 18 | Ht 64.0 in | Wt 261.0 lb

## 2019-11-15 DIAGNOSIS — C541 Malignant neoplasm of endometrium: Secondary | ICD-10-CM

## 2019-11-15 NOTE — Progress Notes (Signed)
Gynecologic Oncology Return Clinic Visit  11/15/19   Reason for Visit: post-op follow-up  Treatment History: Oncology History Overview Note  CA-125  10/12/19: 48.9   Endometrial cancer ([place])  09/19/2019 Initial Biopsy   EMB: EMCA with clear cell features   09/19/2019 Initial Diagnosis   Endometrial cancer ([place])   10/17/2019 Imaging   CT A/P: Mild endometrial thickening in this patient with known endometrial cancer. No evidence of metastatic disease in the abdomen/pelvis.  CXR 12/28: No evidence of acute cardiopulmonary abnormality.   10/25/2019 Surgery   TRH/BSO, bil SLN, repair of sulcal tear Findings: On EUA, small cervix, moderately mobile uterus, no adnexal masses.  On intra-abdominal entry, adhesions from the omentum to the anterior wall at the patient's prior hernia repair as well as to the lateral anterior wall bilaterally.  Right aspect of the liver and diaphragm of secured by adhesions.  Left liver edge, diaphragm and stomach normal in appearance.  Uterus 10 cm in bulbous.  Posterior cul-de-sac completely obliterated and bilateral fallopian tubes and adnexa adherent to posterior uterus and sigmoid mesentery.  Fallopian tubes and left ovary somewhat concerning for involvement by tumor versus prior history of pelvic infection or endometriosis.  Mapping successful to bilateral external iliac sentinel lymph nodes.  Small and large bowel normal appearing.  No intra-abdominal evidence of disease.  Significant right sulcal tear noted after delivery of specimen through the vagina, repaired robotically as well as vaginally.   10/25/2019 Cancer Staging   Cancer Staging Endometrial cancer [facility]) Staging form: Corpus Uteri - Adenosarcoma, AJCC 8th Edition - Clinical: FIGO Stage IA (cT1a, cN0, cM0) - Unsigned MI <50%, no LVSI, negative SLNs   10/30/2019 Cancer Staging   Staging form: Corpus Uteri - Adenosarcoma, AJCC 8th Edition - Clinical: FIGO Stage IA (cT1a, cN0, cM0) - Signed by  Lafonda Mosses, MD on 10/30/2019     Interval History: Patient overall is doing very well.  She endorses a good appetite without nausea or vomiting.  She endorses regular bowel function and has used Senokot only intermittently.  She denies any urinary symptoms.  She developed some fever blisters all over her lips for which she has been using Abreva with significant improvement.  She has had this before but not nearly as diffuse.  She reports minimal vaginal spotting on several episodes.  Otherwise denies vaginal bleeding or discharge.  Denies any fevers or chills.  Past Medical/Surgical History: Past Medical History:  Diagnosis Date  . Allergic rhinitis   . Anemia    hematology, Dr. Julien Nordmann, last 01/2011  . Asthma   . CHF (congestive heart failure) ([place]) 2004  . Chronic kidney disease    protein in urine  . Complication of anesthesia   . Dyslipidemia   . Dyspnea    on excertion  . Endometrial cancer ([place])   . H/O echocardiogram 01/07/04   mild LVH, nl LV systolic function, EF 59%, left atrial enlargement, mildly thickened aortic and mitral valves  . History of thyroid cancer   . Hypertension   . Incarcerated ventral hernia 10/07/2013  . Obesity, Class III, BMI 40-49.9 (morbid obesity) ([place]) 10/09/2013  . Osteopenia   . PONV (postoperative nausea and vomiting)   . Umbilical hernia   . Unspecified essential hypertension 10/09/2013  . Unspecified hypothyroidism 10/09/2013    Past Surgical History:  Procedure Laterality Date  . BONE MARROW BIOPSY    . CARDIAC CATHETERIZATION  2005  . CHOLECYSTECTOMY  2003  . COLONOSCOPY  2008  . ROBOTIC  ASSISTED TOTAL HYSTERECTOMY WITH BILATERAL SALPINGO OOPHERECTOMY N/A 10/25/2019   Procedure: XI ROBOTIC ASSISTED TOTAL HYSTERECTOMY WITH BILATERAL SALPINGO OOPHORECTOMY;  Surgeon: Lafonda Mosses, MD;  [facility]: [facility];  Service: Gynecology;  Laterality: N/A;  . SENTINEL NODE BIOPSY N/A 10/25/2019   Procedure: SENTINEL LYMPH NODE BIOPSY;   Surgeon: Lafonda Mosses, MD;  [facility];  Service: Gynecology;  Laterality: N/A;  . THYROID SURGERY     partial thyroidectomy  . TOTAL THYROIDECTOMY  2009  . VENTRAL HERNIA REPAIR N/A 10/07/2013   Procedure: HERNIA REPAIR VENTRAL ADULT;  Surgeon: Gwenyth Ober, MD;  Location: [place];  Service: General;  Laterality: N/A;    Family History  Problem Relation Age of Onset  . Hypertension Mother   . Pancreatic cancer Mother   . Breast cancer Maternal Aunt   . Lung cancer Father   . Ovarian cancer Neg Hx   . Endometrial cancer Neg Hx   . Colon cancer Neg Hx     Social History   Socioeconomic History  . Marital status: Divorced    Spouse name: Not on file  . Number of children: 1  . Years of education: Not on file  . Highest education level: Not on file  Occupational History  . Not on file  Tobacco Use  . Smoking status: Never Smoker  . Smokeless tobacco: Never Used  Substance and Sexual Activity  . Alcohol use: No  . Drug use: No  . Sexual activity: Not on file  Other Topics Concern  . Not on file  Social History Narrative   Works at [employer] at [employer], is on her feet for 8-hour shifts   Social Determinants of Health   Financial Resource Strain:   . Difficulty of Paying Living Expenses: Not on file  Food Insecurity:   . Worried About Charity fundraiser in the Last Year: Not on file  . Ran Out of Food in the Last Year: Not on file  Transportation Needs:   . Lack of Transportation (Medical): Not on file  . Lack of Transportation (Non-Medical): Not on file  Physical Activity:   . Days of Exercise per Week: Not on file  . Minutes of Exercise per Session: Not on file  Stress:   . Feeling of Stress : Not on file  Social Connections:   . Frequency of Communication with Friends and Family: Not on file  . Frequency of Social Gatherings with Friends and Family: Not on file  . Attends Religious Services: Not on file  . Active Member of Clubs or  Organizations: Not on file  . Attends Archivist Meetings: Not on file  . Marital Status: Not on file    Current Medications:  Current Outpatient Medications:  .  albuterol (PROVENTIL HFA;VENTOLIN HFA) 108 (90 Base) MCG/ACT inhaler, Inhale 2 puffs into the lungs every 4 (four) hours as needed for wheezing or shortness of breath., Disp: 1 Inhaler, Rfl: 0 .  albuterol (PROVENTIL) (2.5 MG/3ML) 0.083% nebulizer solution, Take 2.5 mg by nebulization every 6 (six) hours as needed for wheezing or shortness of breath., Disp: , Rfl:  .  aspirin EC 81 MG tablet, Take 81 mg by mouth daily., Disp: , Rfl:  .  Calcium Carbonate-Vitamin D (CALCIUM 600+D PO), Take 1 tablet by mouth daily., Disp: , Rfl:  .  Fe Fum-FePoly-Vit C-Vit B3 (INTEGRA PO), Take 1 tablet by mouth daily., Disp: , Rfl:  .  levothyroxine (SYNTHROID, LEVOTHROID) 150  MCG tablet, Take 150 mcg by mouth daily before breakfast. , Disp: , Rfl:  .  lisinopril (PRINIVIL,ZESTRIL) 20 MG tablet, Take 20 mg by mouth daily., Disp: , Rfl:  .  loratadine (CLARITIN) 10 MG tablet, Take 10 mg by mouth every morning., Disp: , Rfl:  .  NIFEdipine (PROCARDIA-XL/ADALAT-CC/NIFEDICAL-XL) 30 MG 24 hr tablet, Take 30 mg by mouth daily., Disp: , Rfl:  .  olopatadine (PATANOL) 0.1 % ophthalmic solution, Place 1 drop into both eyes 2 (two) times daily., Disp: , Rfl:  .  rosuvastatin (CRESTOR) 10 MG tablet, Take 10 mg by mouth at bedtime., Disp: , Rfl:   Review of Symptoms: Denies appetite changes, fevers, chills, fatigue, unexplained weight changes. Denies hearing loss, neck lumps or masses, mouth sores, ringing in ears or voice changes. Denies cough or wheezing.  Denies shortness of breath. Denies chest pain or palpitations. Denies leg swelling. Denies abdominal distention, pain, blood in stools, constipation, diarrhea, nausea, vomiting, or early satiety. Denies pain with intercourse, dysuria, frequency, hematuria or incontinence. Denies hot flashes,  pelvic pain, vaginal bleeding or vaginal discharge.   Denies joint pain, back pain or muscle pain/cramps. Denies itching, rash, or wounds. Denies dizziness, headaches, numbness or seizures. Denies swollen lymph nodes or glands, denies easy bruising or bleeding. Denies anxiety, depression, confusion, or decreased concentration.  Physical Exam: BP 140/68 (BP Location: Left Arm, Patient Position: Sitting)   Pulse 77   Temp 98.7 F (37.1 C) (Temporal)   Resp 18   Ht '5\' 4"'$  (1.626 m)   Wt 261 lb (118.4 kg)   LMP 10/22/2011   SpO2 100%   BMI 44.80 kg/m  General: Alert, oriented, no acute distress. HEENT: Atraumatic, sclera anicteric. Chest: Unlabored breathing on room air. Abdomen: Obese, soft, nontender.  Normoactive bowel sounds.  No masses or hepatosplenomegaly appreciated.  Well-healed incisions.  Some scabbing at 1 incision site, no erythema, induration or exudate. Extremities: Grossly normal range of motion.  Warm, well perfused.  No edema bilaterally. Skin: No rashes or lesions noted. GU: Normal appearing external genitalia without erythema, excoriation, or lesions.  Speculum exam reveals cuff intact, suture visible at the cuff and along the right's sulcal area, repaired at the time of surgery.  No bleeding or discharge.  Bimanual exam reveals cuff intact, no fluctuance.    Laboratory & Radiologic Studies: None new  Assessment & Plan: Bridget Lamb is a 64 y.o. woman with Stage IA gr 1 low risk endometrioid adenocarcinoma who presents today for postoperative follow-up.    Patient is overall doing well and healing nicely after surgery.  We discussed activity restriction for the entire 6 weeks.  Since she has to occasionally do heavier lifting at work, I recommended that she wait 6 weeks prior to returning full-time.  We discussed signs and symptoms that should prompt a phone call prior to her next appointment with me and that would be concerning for possible disease recurrence.   We also reviewed again that no adjuvant treatment is needed or recommended given her final pathology.  Per SGO surveillance recommendations, she will see me every 6 months for 2 years and then transition to yearly visits.  All of her questions answered today.  Jeral Pinch, MD  Division of Gynecologic Oncology  Department of Obstetrics and Gynecology  [facility]

## 2019-11-15 NOTE — Patient Instructions (Signed)
You are doing really well and healing great from surgery!  Keep an eye on the one incision that still healing and call me if you develop any discharge or redness around that area.  If you will call our clinic in April or May to schedule your 45-month follow-up visit with me, that would be great.  I will see you every 6 months for the next 2 years.  You should call the clinic at [phone] prior to your visit if you develop any new symptoms such as vaginal bleeding, discharge, abdominal pain, pelvic pain, or change in your bowel function.

## 2019-12-13 ENCOUNTER — Telehealth: Payer: Self-pay

## 2019-12-13 NOTE — Telephone Encounter (Signed)
Returned call to patient.  She was inquiring about her Hgb level and wondering if the number was concerning (10.1).  After checking labs from previous, it is noted that patient's Hgb has been lower.  Patient recently had surgery and was made aware that her Hgb is coming up.  Suggested to patient to have PCP work her up if she is concerned (check for anemia, etc.)  Patient voiced understanding of above.  Knows to call with any issues.

## 2020-02-20 ENCOUNTER — Inpatient Hospital Stay (HOSPITAL_COMMUNITY)
Admission: EM | Admit: 2020-02-20 | Discharge: 2020-02-26 | DRG: 291 | Disposition: A | Discharge status: Home / Self Care | Payer: BC Managed Care – PPO | Attending: Student | Admitting: Student

## 2020-02-20 ENCOUNTER — Emergency Department (HOSPITAL_COMMUNITY): Payer: BC Managed Care – PPO

## 2020-02-20 DIAGNOSIS — I130 Hypertensive heart and chronic kidney disease with heart failure and stage 1 through stage 4 chronic kidney disease, or unspecified chronic kidney disease: Principal | ICD-10-CM | POA: Diagnosis present

## 2020-02-20 DIAGNOSIS — D509 Iron deficiency anemia, unspecified: Secondary | ICD-10-CM | POA: Diagnosis present

## 2020-02-20 DIAGNOSIS — G4730 Sleep apnea, unspecified: Secondary | ICD-10-CM | POA: Diagnosis present

## 2020-02-20 DIAGNOSIS — T502X5A Adverse effect of carbonic-anhydrase inhibitors, benzothiadiazides and other diuretics, initial encounter: Secondary | ICD-10-CM | POA: Diagnosis not present

## 2020-02-20 DIAGNOSIS — E785 Hyperlipidemia, unspecified: Secondary | ICD-10-CM | POA: Diagnosis present

## 2020-02-20 DIAGNOSIS — I161 Hypertensive emergency: Secondary | ICD-10-CM | POA: Diagnosis present

## 2020-02-20 DIAGNOSIS — Z20822 Contact with and (suspected) exposure to covid-19: Secondary | ICD-10-CM | POA: Diagnosis present

## 2020-02-20 DIAGNOSIS — E039 Hypothyroidism, unspecified: Secondary | ICD-10-CM | POA: Diagnosis present

## 2020-02-20 DIAGNOSIS — Z9049 Acquired absence of other specified parts of digestive tract: Secondary | ICD-10-CM

## 2020-02-20 DIAGNOSIS — N1832 Chronic kidney disease, stage 3b: Secondary | ICD-10-CM | POA: Diagnosis present

## 2020-02-20 DIAGNOSIS — E669 Obesity, unspecified: Secondary | ICD-10-CM | POA: Diagnosis present

## 2020-02-20 DIAGNOSIS — N179 Acute kidney failure, unspecified: Secondary | ICD-10-CM | POA: Diagnosis present

## 2020-02-20 DIAGNOSIS — J9601 Acute respiratory failure with hypoxia: Secondary | ICD-10-CM | POA: Diagnosis present

## 2020-02-20 DIAGNOSIS — J90 Pleural effusion, not elsewhere classified: Secondary | ICD-10-CM | POA: Diagnosis not present

## 2020-02-20 DIAGNOSIS — J45909 Unspecified asthma, uncomplicated: Secondary | ICD-10-CM | POA: Diagnosis present

## 2020-02-20 DIAGNOSIS — Z8542 Personal history of malignant neoplasm of other parts of uterus: Secondary | ICD-10-CM

## 2020-02-20 DIAGNOSIS — Z6841 Body Mass Index (BMI) 40.0 and over, adult: Secondary | ICD-10-CM

## 2020-02-20 DIAGNOSIS — Z8249 Family history of ischemic heart disease and other diseases of the circulatory system: Secondary | ICD-10-CM

## 2020-02-20 DIAGNOSIS — I5031 Acute diastolic (congestive) heart failure: Secondary | ICD-10-CM | POA: Diagnosis not present

## 2020-02-20 DIAGNOSIS — Z803 Family history of malignant neoplasm of breast: Secondary | ICD-10-CM

## 2020-02-20 DIAGNOSIS — I160 Hypertensive urgency: Secondary | ICD-10-CM | POA: Insufficient documentation

## 2020-02-20 DIAGNOSIS — J810 Acute pulmonary edema: Secondary | ICD-10-CM | POA: Diagnosis present

## 2020-02-20 DIAGNOSIS — Z801 Family history of malignant neoplasm of trachea, bronchus and lung: Secondary | ICD-10-CM

## 2020-02-20 DIAGNOSIS — E876 Hypokalemia: Secondary | ICD-10-CM | POA: Diagnosis not present

## 2020-02-20 DIAGNOSIS — D539 Nutritional anemia, unspecified: Secondary | ICD-10-CM | POA: Diagnosis present

## 2020-02-20 DIAGNOSIS — Z85850 Personal history of malignant neoplasm of thyroid: Secondary | ICD-10-CM

## 2020-02-20 DIAGNOSIS — G4733 Obstructive sleep apnea (adult) (pediatric): Secondary | ICD-10-CM | POA: Diagnosis present

## 2020-02-20 DIAGNOSIS — Z79890 Hormone replacement therapy (postmenopausal): Secondary | ICD-10-CM

## 2020-02-20 DIAGNOSIS — I5033 Acute on chronic diastolic (congestive) heart failure: Secondary | ICD-10-CM | POA: Diagnosis present

## 2020-02-20 DIAGNOSIS — Z7982 Long term (current) use of aspirin: Secondary | ICD-10-CM

## 2020-02-20 DIAGNOSIS — J9602 Acute respiratory failure with hypercapnia: Secondary | ICD-10-CM | POA: Diagnosis present

## 2020-02-20 DIAGNOSIS — E874 Mixed disorder of acid-base balance: Secondary | ICD-10-CM | POA: Diagnosis not present

## 2020-02-20 DIAGNOSIS — R0600 Dyspnea, unspecified: Secondary | ICD-10-CM

## 2020-02-20 DIAGNOSIS — Z800 Family history of malignant neoplasm of digestive organs: Secondary | ICD-10-CM

## 2020-02-20 DIAGNOSIS — E875 Hyperkalemia: Secondary | ICD-10-CM | POA: Diagnosis present

## 2020-02-20 DIAGNOSIS — I361 Nonrheumatic tricuspid (valve) insufficiency: Secondary | ICD-10-CM | POA: Diagnosis not present

## 2020-02-20 DIAGNOSIS — I10 Essential (primary) hypertension: Secondary | ICD-10-CM | POA: Diagnosis not present

## 2020-02-20 DIAGNOSIS — R0602 Shortness of breath: Secondary | ICD-10-CM

## 2020-02-20 DIAGNOSIS — D631 Anemia in chronic kidney disease: Secondary | ICD-10-CM | POA: Diagnosis not present

## 2020-02-20 DIAGNOSIS — Z79899 Other long term (current) drug therapy: Secondary | ICD-10-CM

## 2020-02-20 LAB — POCT I-STAT 7, (LYTES, BLD GAS, ICA,H+H)
Acid-Base Excess: 2 mmol/L (ref 0.0–2.0)
Acid-Base Excess: 1 mmol/L (ref 0.0–2.0)
Bicarbonate: 33.4 mmol/L — ABNORMAL HIGH (ref 20.0–28.0)
Bicarbonate: 30.7 mmol/L — ABNORMAL HIGH (ref 20.0–28.0)
Calcium, Ion: 1.36 mmol/L (ref 1.15–1.40)
Calcium, Ion: 1.37 mmol/L (ref 1.15–1.40)
HCT: 34 % — ABNORMAL LOW (ref 36.0–46.0)
HCT: 33 % — ABNORMAL LOW (ref 36.0–46.0)
Hemoglobin: 11.6 g/dL — ABNORMAL LOW (ref 12.0–15.0)
Hemoglobin: 11.2 g/dL — ABNORMAL LOW (ref 12.0–15.0)
O2 Saturation: 100 %
O2 Saturation: 88 %
Patient temperature: 97.7
Potassium: 4.9 mmol/L (ref 3.5–5.1)
Potassium: 5.4 mmol/L — ABNORMAL HIGH (ref 3.5–5.1)
Sodium: 140 mmol/L (ref 135–145)
Sodium: 140 mmol/L (ref 135–145)
TCO2: 36 mmol/L — ABNORMAL HIGH (ref 22–32)
TCO2: 33 mmol/L — ABNORMAL HIGH (ref 22–32)
pCO2 arterial: 94.4 mmHg (ref 32.0–48.0)
pCO2 arterial: 73.8 mmHg (ref 32.0–48.0)
pH, Arterial: 7.156 — CL (ref 7.350–7.450)
pH, Arterial: 7.224 — ABNORMAL LOW (ref 7.350–7.450)
pO2, Arterial: 314 mmHg — ABNORMAL HIGH (ref 83.0–108.0)
pO2, Arterial: 66 mmHg — ABNORMAL LOW (ref 83.0–108.0)

## 2020-02-20 LAB — CBC WITH DIFFERENTIAL/PLATELET
Abs Immature Granulocytes: 0.24 10*3/uL — ABNORMAL HIGH (ref 0.00–0.07)
Basophils Absolute: 0.1 10*3/uL (ref 0.0–0.1)
Basophils Relative: 1 %
Eosinophils Absolute: 0.1 10*3/uL (ref 0.0–0.5)
Eosinophils Relative: 1 %
HCT: 36.2 % (ref 36.0–46.0)
Hemoglobin: 10.3 g/dL — ABNORMAL LOW (ref 12.0–15.0)
Immature Granulocytes: 2 %
Lymphocytes Relative: 10 %
Lymphs Abs: 1.3 10*3/uL (ref 0.7–4.0)
MCH: 28.6 pg (ref 26.0–34.0)
MCHC: 28.5 g/dL — ABNORMAL LOW (ref 30.0–36.0)
MCV: 100.6 fL — ABNORMAL HIGH (ref 80.0–100.0)
Monocytes Absolute: 0.6 10*3/uL (ref 0.1–1.0)
Monocytes Relative: 5 %
Neutro Abs: 11 10*3/uL — ABNORMAL HIGH (ref 1.7–7.7)
Neutrophils Relative %: 81 %
Platelets: 220 10*3/uL (ref 150–400)
RBC: 3.6 MIL/uL — ABNORMAL LOW (ref 3.87–5.11)
RDW: 14.9 % (ref 11.5–15.5)
WBC: 13.4 10*3/uL — ABNORMAL HIGH (ref 4.0–10.5)
nRBC: 0.5 % — ABNORMAL HIGH (ref 0.0–0.2)

## 2020-02-20 LAB — RESPIRATORY PANEL BY RT PCR (FLU A&B, COVID)
Influenza A by PCR: NEGATIVE
Influenza B by PCR: NEGATIVE
SARS Coronavirus 2 by RT PCR: NEGATIVE

## 2020-02-20 LAB — TROPONIN I (HIGH SENSITIVITY)
Troponin I (High Sensitivity): 12 ng/L (ref <18)
Troponin I (High Sensitivity): 21 ng/L — ABNORMAL HIGH (ref <18)

## 2020-02-20 LAB — COMPREHENSIVE METABOLIC PANEL
ALT: 16 U/L (ref 0–44)
AST: 20 U/L (ref 15–41)
Albumin: 3.9 g/dL (ref 3.5–5.0)
Alkaline Phosphatase: 83 U/L (ref 38–126)
Anion gap: 13 (ref 5–15)
BUN: 26 mg/dL — ABNORMAL HIGH (ref 8–23)
CO2: 24 mmol/L (ref 22–32)
Calcium: 9.4 mg/dL (ref 8.9–10.3)
Chloride: 104 mmol/L (ref 98–111)
Creatinine, Ser: 1.24 mg/dL — ABNORMAL HIGH (ref 0.44–1.00)
GFR calc Af Amer: 53 mL/min — ABNORMAL LOW (ref >60)
GFR calc non Af Amer: 46 mL/min — ABNORMAL LOW (ref >60)
Glucose, Bld: 156 mg/dL — ABNORMAL HIGH (ref 70–99)
Potassium: 4.8 mmol/L (ref 3.5–5.1)
Sodium: 141 mmol/L (ref 135–145)
Total Bilirubin: 0.8 mg/dL (ref 0.3–1.2)
Total Protein: 7.5 g/dL (ref 6.5–8.1)

## 2020-02-20 LAB — HIV ANTIBODY (ROUTINE TESTING W REFLEX): HIV Screen 4th Generation wRfx: NONREACTIVE

## 2020-02-20 LAB — BRAIN NATRIURETIC PEPTIDE: B Natriuretic Peptide: 562.6 pg/mL — ABNORMAL HIGH (ref 0.0–100.0)

## 2020-02-20 LAB — TSH: TSH: 1.263 u[IU]/mL (ref 0.350–4.500)

## 2020-02-20 MED ORDER — HYDRALAZINE HCL 20 MG/ML IJ SOLN: 10.0000 mg | INTRAMUSCULAR | Status: DC | PRN

## 2020-02-20 MED ORDER — SODIUM CHLORIDE 0.9 % IV SOLN
2.0000 g | Freq: Once | INTRAVENOUS | Status: AC
  Administered 2020-02-20: 22:00:00 2 g via INTRAVENOUS
  Filled 2020-02-20: qty 20

## 2020-02-20 MED ORDER — LEVOTHYROXINE SODIUM 75 MCG PO TABS
150.0000 ug | ORAL_TABLET | Freq: Every day | ORAL | Status: DC
  Administered 2020-02-21 – 2020-02-26 (×6): 150 ug via ORAL
  Filled 2020-02-20: qty 2
  Filled 2020-02-20: qty 6
  Filled 2020-02-20 (×4): qty 2

## 2020-02-20 MED ORDER — FUROSEMIDE 10 MG/ML IJ SOLN: 40.0000 mg | Freq: Once | INTRAMUSCULAR | Status: DC

## 2020-02-20 MED ORDER — METHYLPREDNISOLONE SODIUM SUCC 125 MG IJ SOLR
125.0000 mg | Freq: Once | INTRAMUSCULAR | Status: AC
  Administered 2020-02-20: 14:00:00 125 mg via INTRAVENOUS
  Filled 2020-02-20: qty 2

## 2020-02-20 MED ORDER — IOHEXOL 350 MG/ML SOLN
75.0000 mL | Freq: Once | INTRAVENOUS | Status: AC | PRN
  Administered 2020-02-20: 16:00:00 75 mL via INTRAVENOUS

## 2020-02-20 MED ORDER — DOCUSATE SODIUM 100 MG PO CAPS: 100.0000 mg | ORAL_CAPSULE | Freq: Two times a day (BID) | ORAL | Status: DC | PRN

## 2020-02-20 MED ORDER — OLOPATADINE HCL 0.1 % OP SOLN
1.0000 [drp] | Freq: Two times a day (BID) | OPHTHALMIC | Status: DC
  Administered 2020-02-21 – 2020-02-26 (×11): 1 [drp] via OPHTHALMIC
  Filled 2020-02-20: qty 5

## 2020-02-20 MED ORDER — SODIUM CHLORIDE 0.9 % IV SOLN
500.0000 mg | Freq: Once | INTRAVENOUS | Status: DC
  Filled 2020-02-20: qty 500

## 2020-02-20 MED ORDER — POLYETHYLENE GLYCOL 3350 17 G PO PACK: 17.0000 g | PACK | Freq: Every day | ORAL | Status: DC | PRN

## 2020-02-20 MED ORDER — IPRATROPIUM BROMIDE 0.02 % IN SOLN
0.5000 mg | Freq: Once | RESPIRATORY_TRACT | Status: AC
  Administered 2020-02-20: 0.5 mg via RESPIRATORY_TRACT
  Filled 2020-02-20: qty 2.5

## 2020-02-20 MED ORDER — ALBUTEROL SULFATE HFA 108 (90 BASE) MCG/ACT IN AERS: 6.0000 | INHALATION_SPRAY | Freq: Once | RESPIRATORY_TRACT | Status: DC

## 2020-02-20 MED ORDER — ROSUVASTATIN CALCIUM 5 MG PO TABS
10.0000 mg | ORAL_TABLET | Freq: Every day | ORAL | Status: DC
  Administered 2020-02-21 – 2020-02-26 (×6): 10 mg via ORAL
  Filled 2020-02-20 (×6): qty 2

## 2020-02-20 MED ORDER — HEPARIN SODIUM (PORCINE) 5000 UNIT/ML IJ SOLN
5000.0000 [IU] | Freq: Three times a day (TID) | INTRAMUSCULAR | Status: DC
  Administered 2020-02-21 – 2020-02-26 (×16): 5000 [IU] via SUBCUTANEOUS
  Filled 2020-02-20 (×16): qty 1

## 2020-02-20 MED ORDER — FUROSEMIDE 10 MG/ML IJ SOLN
60.0000 mg | Freq: Once | INTRAMUSCULAR | Status: AC
  Administered 2020-02-20: 60 mg via INTRAVENOUS
  Filled 2020-02-20: qty 6

## 2020-02-20 MED ORDER — NIFEDIPINE ER OSMOTIC RELEASE 30 MG PO TB24
30.0000 mg | ORAL_TABLET | Freq: Every day | ORAL | Status: DC
  Administered 2020-02-21 – 2020-02-26 (×6): 30 mg via ORAL
  Filled 2020-02-20 (×6): qty 1

## 2020-02-20 MED ORDER — ALBUTEROL SULFATE (2.5 MG/3ML) 0.083% IN NEBU
5.0000 mg | INHALATION_SOLUTION | Freq: Once | RESPIRATORY_TRACT | Status: AC
  Administered 2020-02-20: 15:00:00 5 mg via RESPIRATORY_TRACT
  Filled 2020-02-20: qty 6

## 2020-02-20 MED ORDER — ALBUTEROL SULFATE (2.5 MG/3ML) 0.083% IN NEBU: 2.5000 mg | INHALATION_SOLUTION | RESPIRATORY_TRACT | Status: DC | PRN

## 2020-02-20 NOTE — ED Triage Notes (Signed)
Pt is here from home with sob and chest pressure. MD office patient was 50%/RA, then placed on simple face mask/6L and increased to 94%.  No fever, alert and oriented.

## 2020-02-20 NOTE — Progress Notes (Signed)
PT transferred to 59m with no complication.

## 2020-02-20 NOTE — ED Notes (Signed)
Ashley RT at bedside.

## 2020-02-20 NOTE — H&P (Signed)
NAME:  Bridget Lamb, MRN:  [id number], DOB:  July 06, 1956, LOS: 0 ADMISSION DATE:  02/20/2020, CONSULTATION DATE:  02/20/20 REFERRING MD:  Sedonia Small  CHIEF COMPLAINT:  SOB   Brief History   Bridget Lamb is a 64 y.o. female who was admitted with acute hypoxic and hypercapnic respiratory failure felt to be 2/2 acute pulmonary edema.  History of present illness   Bridget Lamb is a 64 y.o. female who has a PMH as outlined below.  Bridget Lamb presented to [facility] ED 4/28 with dyspnea and hypoxia.  Bridget Lamb was seen at PCP earlier in the day for chest tightness and found to have sats in the 50's on room air.  In ED, Bridget Lamb was placed on 15L NRB with improvement to 90's but was later changed to BiPAP due to slightly increased WOB.  CTA was neg for PE but did show edema and atelectasis. PCCM was called for admission.  COVID negative.  Past Medical History  has Essential hypertension, benign; Dyslipidemia; Hypothyroidism; Anemia; Obesity, Class III, BMI 40-49.9 (morbid obesity) ([place]); Chronic diastolic CHF (congestive heart failure) ([place]); Iron deficiency anemia; Asthma, chronic, unspecified asthma severity, with acute exacerbation; Bilateral leg edema; Chronic heart failure with preserved ejection fraction ([place]); Endometrial cancer ([place]); and Acute pulmonary edema ([place]) on their problem list.  Significant Hospital Events   4/28 > admit.  Consults:  None.  Procedures:  None.  Significant Diagnostic Tests:  CTA chest 4/28 > neg for PE.  Edema bilaterally, Patchy GGO's, atelectasis, mod effusions.  Micro Data:  Flu 4/28 > neg. COVID 4/28 > neg.  Antimicrobials:  Ceftriaxone and Azithro x 1 in ED.   Interim history/subjective:  Tolerating BiPAP at 18/8.  Objective:  Blood pressure (!) 186/80, pulse 74, temperature 97.7 F (36.5 C), temperature source Oral, resp. rate (!) 21, last menstrual period 10/22/2011, SpO2 97 %.    Vent Mode: PCV;BIPAP FiO2 (%):  [40 %-100 %] 40 % Set Rate:  [8 bmp-10  bmp] 8 bmp PEEP:  [8 cmH20] 8 cmH20  No intake or output data in the 24 hours ending 02/20/20 2005 There were no vitals filed for this visit.  Examination: General: Adult female, in NAD. Neuro: Awake, answers basic questions appropriately. HEENT: [place]/AT. Sclerae anicteric.  BiPAP in place. Cardiovascular: RRR, no M/R/G.  Lungs: Respirations even and unlabored.  CTA bilaterally, No W/R/R.  Abdomen: Obese.  BS x 4, soft, NT/ND.  Musculoskeletal: No gross deformities, 2+ edema to knees. Skin: Intact, warm, no rashes.  Assessment & Plan:   Acute pulmonary edema - unclear etiology, ? Due to hypertensive urgency.  Acute hypoxic and hypercapnic respiratory failure - 2/2 above. - Continue BiPAP through tonight. - 60 mg lasix now. - Might consider nitro gtt, but hold for now as already has improved on BiPAP and expect further improvement after lasix.  Hypertensive urgency. - Hydralazine PRN. - Continue home nifedipine. - Hold home lisinopril.  AKI. Mild hyperkalemia. - Lasix as above. - Follow BMP.  Hx hypothyroidism. - Continue home synthroid. - Check TSH.  Hx endometrial CA - s/p TRH / BSO. - f/u with GYN ONC as outpatient.  Best Practice:  Diet: NPO for now. Pain/Anxiety/Delirium protocol (if indicated): N/A. VAP protocol (if indicated): n/A. DVT prophylaxis: SCD's / Heparin. GI prophylaxis: N/A. Glucose control: SSI if glucose consistently > 180. Mobility: Bedrest. Code Status: Full. Family Communication: None. Disposition: ICU.  Labs   CBC: Recent Labs  Lab 02/20/20 1253 02/20/20 1312 02/20/20 1823  WBC 13.4*  --   --  NEUTROABS 11.0*  --   --   HGB 10.3* 11.6* 11.2*  HCT 36.2 34.0* 33.0*  MCV 100.6*  --   --   PLT 220  --   --    Basic Metabolic Panel: Recent Labs  Lab 02/20/20 1253 02/20/20 1312 02/20/20 1823  NA 141 140 140  K 4.8 4.9 5.4*  CL 104  --   --   CO2 24  --   --   GLUCOSE 156*  --   --   BUN 26*  --   --   CREATININE 1.24*  --    --   CALCIUM 9.4  --   --    GFR: CrCl cannot be calculated (Unknown ideal weight.). Recent Labs  Lab 02/20/20 1253  WBC 13.4*   Liver Function Tests: Recent Labs  Lab 02/20/20 1253  AST 20  ALT 16  ALKPHOS 83  BILITOT 0.8  PROT 7.5  ALBUMIN 3.9   No results for input(s): LIPASE, AMYLASE in the last 168 hours. No results for input(s): AMMONIA in the last 168 hours. ABG    Component Value Date/Time   PHART 7.224 (L) 02/20/2020 1823   PCO2ART 73.8 (HH) 02/20/2020 1823   PO2ART 66 (L) 02/20/2020 1823   HCO3 30.7 (H) 02/20/2020 1823   TCO2 33 (H) 02/20/2020 1823   O2SAT 88.0 02/20/2020 1823    Coagulation Profile: No results for input(s): INR, PROTIME in the last 168 hours. Cardiac Enzymes: No results for input(s): CKTOTAL, CKMB, CKMBINDEX, TROPONINI in the last 168 hours. HbA1C: No results found for: HGBA1C CBG: No results for input(s): GLUCAP in the last 168 hours.  Review of Systems:   All negative; except for those that are bolded, which indicate positives.  Constitutional: weight loss, weight gain, night sweats, fevers, chills, fatigue, weakness.  HEENT: headaches, sore throat, sneezing, nasal congestion, post nasal drip, difficulty swallowing, tooth/dental problems, visual complaints, visual changes, ear aches. Neuro: difficulty with speech, weakness, numbness, ataxia. CV:  chest pain, orthopnea, PND, swelling in lower extremities, dizziness, palpitations, syncope.  Resp: cough, hemoptysis, dyspnea, wheezing. GI: heartburn, indigestion, abdominal pain, nausea, vomiting, diarrhea, constipation, change in bowel habits, loss of appetite, hematemesis, melena, hematochezia.  GU: dysuria, change in color of urine, urgency or frequency, flank pain, hematuria. MSK: joint pain or swelling, decreased range of motion. Psych: change in mood or affect, depression, anxiety, suicidal ideations, homicidal ideations. Skin: rash, itching, bruising.   Past medical history   Bridget Lamb,  has a past medical history of Allergic rhinitis, Anemia, Asthma, CHF (congestive heart failure) ([place]) (2004), Chronic kidney disease, Complication of anesthesia, Dyslipidemia, Dyspnea, Endometrial cancer ([place]), H/O echocardiogram (01/07/04), History of thyroid cancer, Hypertension, Incarcerated ventral hernia (10/07/2013), Obesity, Class III, BMI 40-49.9 (morbid obesity) ([place]) (10/09/2013), Osteopenia, PONV (postoperative nausea and vomiting), Umbilical hernia, Unspecified essential hypertension (10/09/2013), and Unspecified hypothyroidism (10/09/2013).   Surgical History    Past Surgical History:  Procedure Laterality Date  . BONE MARROW BIOPSY    . CARDIAC CATHETERIZATION  2005  . CHOLECYSTECTOMY  2003  . COLONOSCOPY  2008  . ROBOTIC ASSISTED TOTAL HYSTERECTOMY WITH BILATERAL SALPINGO OOPHERECTOMY N/A 10/25/2019   Procedure: XI ROBOTIC ASSISTED TOTAL HYSTERECTOMY WITH BILATERAL SALPINGO OOPHORECTOMY;  Surgeon: Lafonda Mosses, MD;  [facility];  Service: Gynecology;  Laterality: N/A;  . SENTINEL NODE BIOPSY N/A 10/25/2019   Procedure: SENTINEL LYMPH NODE BIOPSY;  Surgeon: Lafonda Mosses, MD;  [facility];  Service: Gynecology;  Laterality: N/A;  . THYROID  SURGERY     partial thyroidectomy  . TOTAL THYROIDECTOMY  2009  . VENTRAL HERNIA REPAIR N/A 10/07/2013   Procedure: HERNIA REPAIR VENTRAL ADULT;  Surgeon: Gwenyth Ober, MD;  Location: [facility];  Service: General;  Laterality: N/A;     Social History   reports that Bridget Lamb has never smoked. Bridget Lamb has never used smokeless tobacco. Bridget Lamb reports that Bridget Lamb does not drink alcohol or use drugs.   Family history   Her family history includes Breast cancer in her maternal aunt; Hypertension in her mother; Lung cancer in her father; Pancreatic cancer in her mother. There is no history of Ovarian cancer, Endometrial cancer, or Colon cancer.   Allergies No Known Allergies   Home meds  Prior to Admission medications   Medication  Sig Start Date End Date Taking? Authorizing Provider  albuterol (PROVENTIL HFA;VENTOLIN HFA) 108 (90 Base) MCG/ACT inhaler Inhale 2 puffs into the lungs every 4 (four) hours as needed for wheezing or shortness of breath. 09/01/18   Aline August, MD  albuterol (PROVENTIL) (2.5 MG/3ML) 0.083% nebulizer solution Take 2.5 mg by nebulization every 6 (six) hours as needed for wheezing or shortness of breath.    [provider]  aspirin EC 81 MG tablet Take 81 mg by mouth daily.    [provider]  Calcium Carbonate-Vitamin D (CALCIUM 600+D PO) Take 1 tablet by mouth daily.    [provider]  Fe Fum-FePoly-Vit C-Vit B3 (INTEGRA PO) Take 1 tablet by mouth daily.    [provider]  levothyroxine (SYNTHROID, LEVOTHROID) 150 MCG tablet Take 150 mcg by mouth daily before breakfast.     [provider]  lisinopril (PRINIVIL,ZESTRIL) 20 MG tablet Take 20 mg by mouth daily.    [provider]  loratadine (CLARITIN) 10 MG tablet Take 10 mg by mouth every morning.    [provider]  NIFEdipine (PROCARDIA-XL/ADALAT-CC/NIFEDICAL-XL) 30 MG 24 hr tablet Take 30 mg by mouth daily.    [provider]  olopatadine (PATANOL) 0.1 % ophthalmic solution Place 1 drop into both eyes 2 (two) times daily. 04/07/18   [provider]  rosuvastatin (CRESTOR) 10 MG tablet Take 10 mg by mouth at bedtime. 11/28/17   [provider]    Critical care time: 40 min.    Montey Hora, [facility] 02/20/2020, 8:05 PM

## 2020-02-20 NOTE — Progress Notes (Signed)
Patient taking to CT without any apparent complications.

## 2020-02-20 NOTE — ED Notes (Signed)
Grand-daughter. Tyson [phone]- would like a pt update.

## 2020-02-20 NOTE — ED Notes (Signed)
Pt taken to CT with this RN and RT 

## 2020-02-20 NOTE — ED Notes (Signed)
Pt got out of bed and urinated on the floor. Pt situated back in bed and a new purewick was placed.

## 2020-02-20 NOTE — ED Provider Notes (Signed)
[facility] EMERGENCY DEPARTMENT Provider Note   CSN: [id number] Arrival date & time: 02/20/20  1209     History Chief Complaint  Patient presents with  . Shortness of Breath    Mariya Mottley Hohmann is a 64 y.o. female with past medical history significant for asthma, CHF, CKD, hypertension, presents to emergency department today via EMS with  chief complaint of shortness of breath. Patient was at a PCP appointment and found to be hypoxic to 50% on room air. Patient is reporting not feeling well x 2 days. She has had non productive cough and constant chest pain. She states pain is located in the middle of her chest and feels like a pressure. She rates pain 10/10 in severity.  Chest pressure does not radiate.  She also endorses having to use her albuterol inhaler more often in the last two weeks. Denies family history of cardiac disease. She was given aspirin by EMS. BP elevated for EMS 170/110. She admits to receiving first moderna vaccine.  She denies fever, chills, headache, sore throat, congestion, abdominal pain, nausea, vomiting, urinary symptoms, diarrhea.  Denies any change in weight.  She states her legs are always swollen.  She denies history of intubation or hospitalization for asthma.  History provided by patient with additional history obtained from chart review.     Past Medical History:  Diagnosis Date  . Allergic rhinitis   . Anemia    hematology, Dr. Julien Nordmann, last 01/2011  . Asthma   . CHF (congestive heart failure) ([place]) 2004  . Chronic kidney disease    protein in urine  . Complication of anesthesia   . Dyslipidemia   . Dyspnea    on excertion  . Endometrial cancer ([place])   . H/O echocardiogram 01/07/04   mild LVH, nl LV systolic function, EF 14%, left atrial enlargement, mildly thickened aortic and mitral valves  . History of thyroid cancer   . Hypertension   . Incarcerated ventral hernia 10/07/2013  . Obesity, Class III, BMI 40-49.9 (morbid  obesity) ([place]) 10/09/2013  . Osteopenia   . PONV (postoperative nausea and vomiting)   . Umbilical hernia   . Unspecified essential hypertension 10/09/2013  . Unspecified hypothyroidism 10/09/2013    Patient Active Problem List   Diagnosis Date Noted  . Endometrial cancer ([place]) 10/12/2019  . Bilateral leg edema 12/20/2018  . Chronic heart failure with preserved ejection fraction ([place]) 12/20/2018  . Asthma, chronic, unspecified asthma severity, with acute exacerbation 08/29/2018  . Iron deficiency anemia 02/04/2015  . Chronic diastolic CHF (congestive heart failure) ([place]) 12/24/2013  . Obesity, Class III, BMI 40-49.9 (morbid obesity) ([place]) 10/09/2013  . Essential hypertension, benign 01/31/2012  . Dyslipidemia 01/31/2012  . Hypothyroidism 01/31/2012  . Anemia 01/31/2012    Past Surgical History:  Procedure Laterality Date  . BONE MARROW BIOPSY    . CARDIAC CATHETERIZATION  2005  . CHOLECYSTECTOMY  2003  . COLONOSCOPY  2008  . ROBOTIC ASSISTED TOTAL HYSTERECTOMY WITH BILATERAL SALPINGO OOPHERECTOMY N/A 10/25/2019   Procedure: XI ROBOTIC ASSISTED TOTAL HYSTERECTOMY WITH BILATERAL SALPINGO OOPHORECTOMY;  Surgeon: Lafonda Mosses, MD;  Location: [facility];  Service: Gynecology;  Laterality: N/A;  . SENTINEL NODE BIOPSY N/A 10/25/2019   Procedure: SENTINEL LYMPH NODE BIOPSY;  Surgeon: Lafonda Mosses, MD;  Location: [facility];  Service: Gynecology;  Laterality: N/A;  . THYROID SURGERY     partial thyroidectomy  . TOTAL THYROIDECTOMY  2009  . VENTRAL HERNIA REPAIR N/A 10/07/2013  Procedure: HERNIA REPAIR VENTRAL ADULT;  Surgeon: Gwenyth Ober, MD;  Location: [facility];  Service: General;  Laterality: N/A;     OB History    Gravida  3   Para  1   Term      Preterm      AB  2   Living  1     SAB      TAB      Ectopic      Multiple      Live Births           Obstetric Comments  Last mammogram: 12/25/2018 Last colonoscopy: 2012 Last Pap smear: 08/15/2019,  negative, HPV negative History of abnormal Pap smears: No History of sexually transmitted diseases: Denies Age at menarche: 25        Family History  Problem Relation Age of Onset  . Hypertension Mother   . Pancreatic cancer Mother   . Breast cancer Maternal Aunt   . Lung cancer Father   . Ovarian cancer Neg Hx   . Endometrial cancer Neg Hx   . Colon cancer Neg Hx     Social History   Tobacco Use  . Smoking status: Never Smoker  . Smokeless tobacco: Never Used  Substance Use Topics  . Alcohol use: No  . Drug use: No    Home Medications Prior to Admission medications   Medication Sig Start Date End Date Taking? Authorizing Provider  albuterol (PROVENTIL HFA;VENTOLIN HFA) 108 (90 Base) MCG/ACT inhaler Inhale 2 puffs into the lungs every 4 (four) hours as needed for wheezing or shortness of breath. 09/01/18   Aline August, MD  albuterol (PROVENTIL) (2.5 MG/3ML) 0.083% nebulizer solution Take 2.5 mg by nebulization every 6 (six) hours as needed for wheezing or shortness of breath.    [provider]  aspirin EC 81 MG tablet Take 81 mg by mouth daily.    [provider]  Calcium Carbonate-Vitamin D (CALCIUM 600+D PO) Take 1 tablet by mouth daily.    [provider]  Fe Fum-FePoly-Vit C-Vit B3 (INTEGRA PO) Take 1 tablet by mouth daily.    [provider]  levothyroxine (SYNTHROID, LEVOTHROID) 150 MCG tablet Take 150 mcg by mouth daily before breakfast.     [provider]  lisinopril (PRINIVIL,ZESTRIL) 20 MG tablet Take 20 mg by mouth daily.    [provider]  loratadine (CLARITIN) 10 MG tablet Take 10 mg by mouth every morning.    [provider]  NIFEdipine (PROCARDIA-XL/ADALAT-CC/NIFEDICAL-XL) 30 MG 24 hr tablet Take 30 mg by mouth daily.    [provider]  olopatadine (PATANOL) 0.1 % ophthalmic solution Place 1 drop into both eyes 2 (two) times daily. 04/07/18   [provider]  rosuvastatin  (CRESTOR) 10 MG tablet Take 10 mg by mouth at bedtime. 11/28/17   [provider]    Allergies    Patient has no known allergies.  Review of Systems   Review of Systems  All other systems are reviewed and are negative for acute change except as noted in the HPI.   Physical Exam Updated Vital Signs BP (!) 170/80   Pulse 71   Temp 97.7 F (36.5 C) (Oral)   Resp 15   LMP 10/22/2011   SpO2 100%   Physical Exam Vitals and nursing note reviewed.  Constitutional:      General: She is in acute distress.     Appearance: She is obese. She is ill-appearing. She is not  diaphoretic.  HENT:     Head: Normocephalic and atraumatic.     Right Ear: Tympanic membrane and external ear normal.     Left Ear: Tympanic membrane and external ear normal.     Nose: Nose normal.     Mouth/Throat:     Mouth: Mucous membranes are moist.     Pharynx: Oropharynx is clear.  Eyes:     General: No scleral icterus.       Right eye: No discharge.        Left eye: No discharge.     Extraocular Movements: Extraocular movements intact.     Conjunctiva/sclera: Conjunctivae normal.     Pupils: Pupils are equal, round, and reactive to light.  Neck:     Vascular: No JVD.  Cardiovascular:     Rate and Rhythm: Normal rate and regular rhythm.     Pulses: Normal pulses.          Radial pulses are 2+ on the right side and 2+ on the left side.     Heart sounds: Normal heart sounds.  Pulmonary:     Comments: Patient is tachypneic.  Oxygen saturation is 60% on room air.  Placed on nonrebreather 15 L with oxygen saturation of 94%.  Decreased lung sounds in left upper lobe.  Lung sounds diminished otherwise throughout.  No wheezing, rales or rhonchi. Chest:     Chest wall: No tenderness.  Abdominal:     Comments: Abdomen is soft, non-distended, and non-tender in all quadrants. No rigidity, no guarding. No peritoneal signs.  Musculoskeletal:        General: Normal range of motion.     Cervical back: Normal  range of motion.     Right lower leg: 2+ Pitting Edema present.     Left lower leg: 2+ Pitting Edema present.  Skin:    General: Skin is warm and dry.     Capillary Refill: Capillary refill takes less than 2 seconds.  Neurological:     Mental Status: She is oriented to person, place, and time.     GCS: GCS eye subscore is 4. GCS verbal subscore is 5. GCS motor subscore is 6.     Comments: Fluent speech, no facial droop.  Psychiatric:        Behavior: Behavior normal.     ED Results / Procedures / Treatments   Labs (all labs ordered are listed, but only abnormal results are displayed) Labs Reviewed  COMPREHENSIVE METABOLIC PANEL - Abnormal; Notable for the following components:      Result Value   Glucose, Bld 156 (*)    BUN 26 (*)    Creatinine, Ser 1.24 (*)    GFR calc non Af Amer 46 (*)    GFR calc Af Amer 53 (*)    All other components within normal limits  BRAIN NATRIURETIC PEPTIDE - Abnormal; Notable for the following components:   B Natriuretic Peptide 562.6 (*)    All other components within normal limits  CBC WITH DIFFERENTIAL/PLATELET - Abnormal; Notable for the following components:   WBC 13.4 (*)    RBC 3.60 (*)    Hemoglobin 10.3 (*)    MCV 100.6 (*)    MCHC 28.5 (*)    nRBC 0.5 (*)    Neutro Abs 11.0 (*)    Abs Immature Granulocytes 0.24 (*)    All other components within normal limits  POCT I-STAT 7, (LYTES, BLD GAS, ICA,H+H) - Abnormal; Notable for the following components:  pH, Arterial 7.156 (*)    pCO2 arterial 94.4 (*)    pO2, Arterial 314 (*)    Bicarbonate 33.4 (*)    TCO2 36 (*)    HCT 34.0 (*)    Hemoglobin 11.6 (*)    All other components within normal limits  POCT I-STAT 7, (LYTES, BLD GAS, ICA,H+H) - Abnormal; Notable for the following components:   pH, Arterial 7.224 (*)    pCO2 arterial 73.8 (*)    pO2, Arterial 66 (*)    Bicarbonate 30.7 (*)    TCO2 33 (*)    Potassium 5.4 (*)    HCT 33.0 (*)    Hemoglobin 11.2 (*)    All other  components within normal limits  RESPIRATORY PANEL BY RT PCR (FLU A&B, COVID)  BLOOD GAS, ARTERIAL  I-STAT ARTERIAL BLOOD GAS, ED  TROPONIN I (HIGH SENSITIVITY)  TROPONIN I (HIGH SENSITIVITY)    EKG EKG Interpretation  Date/Time:  Wednesday February 20 2020 12:46:57 EDT Ventricular Rate:  95 PR Interval:    QRS Duration: 100 QT Interval:  339 QTC Calculation: 427 R Axis:   92 Text Interpretation: Sinus rhythm Left posterior fascicular block Low voltage, precordial leads Consider anterior infarct Confirmed by Gerlene Fee [phone]) on 02/20/2020 2:34:04 PM   Radiology CT Angio Chest PE W and/or Wo Contrast  Result Date: 02/20/2020 CLINICAL DATA:  Shortness of breath and chest pain. Decreased oxygen saturation. EXAM: CT ANGIOGRAPHY CHEST WITH CONTRAST TECHNIQUE: Multidetector CT imaging of the chest was performed using the standard protocol during bolus administration of intravenous contrast. Multiplanar CT image reconstructions and MIPs were obtained to evaluate the vascular anatomy. CONTRAST:  77m OMNIPAQUE IOHEXOL 350 MG/ML SOLN COMPARISON:  CT abdomen pelvis 10/16/2019 FINDINGS: Cardiovascular: Heart is enlarged. Aortic atherosclerosis. No pericardial effusion. Adequate opacification of the pulmonary arterial system. Dilated main pulmonary artery as can be seen with pulmonary arterial hypertension. Motion artifact limits evaluation particularly within the lower lobes. No intraluminal filling defect identified to suggest acute pulmonary embolus. Mediastinum/Nodes: No enlarged axillary adenopathy. There is a prominent 10 mm right paratracheal lymph node. Additional prominent subcentimeter prevascular and subcarinal lymph nodes are demonstrated. Normal appearance of the esophagus. Lungs/Pleura: Expiratory phase imaging. Subpleural and scattered areas of consolidation predominantly involving the lower lobes bilaterally. Patchy ground-glass opacities throughout the aerated lungs. Peripheral  consolidation within the right upper lobe (image 52; series 6). Additionally there are smaller scattered nodules and focal areas of consolidation involving the upper lobes bilaterally. Moderate bilateral pleural effusions. No pneumothorax. Upper Abdomen: No acute process. Musculoskeletal: No aggressive or acute appearing osseous lesions. Thoracic spine degenerative changes. Review of the MIP images confirms the above findings. IMPRESSION: 1. No evidence for acute pulmonary embolus. 2. Patchy ground-glass and consolidative opacities throughout the aerated lungs bilaterally which may represent edema or infection. Additionally, there are scattered areas of consolidation within the lungs bilaterally which may represent atelectasis or infection. Consider follow-up chest CT in 6-8 weeks after resolution of acute symptomatology to ensure the nodularity resolves. 3. Moderate bilateral pleural effusions. 4. Prominent mediastinal lymph nodes, likely reactive. 5. Dilated main pulmonary artery as can be seen with pulmonary arterial hypertension. 6. Aortic atherosclerosis. Aortic Atherosclerosis (ICD10-I70.0). Electronically Signed   By: DLovey NewcomerM.D.   On: 02/20/2020 16:50   DG Chest Port 1 View  Result Date: 02/20/2020 CLINICAL DATA:  Shortness of breath and chest pressure EXAM: PORTABLE CHEST 1 VIEW COMPARISON:  October 22, 2019 FINDINGS: There is ill-defined airspace opacity in the  lateral left base. The lungs otherwise are clear. Heart is upper normal in size with pulmonary vascularity normal. No adenopathy. No bone lesions. IMPRESSION: Ill-defined opacity lateral left base, concerning for pneumonia. Lungs elsewhere clear. Stable cardiac silhouette. No adenopathy demonstrable. Electronically Signed   By: Lowella Grip III M.D.   On: 02/20/2020 12:43    Procedures .Critical Care Performed by: Cherre Robins, PA-C Authorized by: Cherre Robins, PA-C   Critical care provider statement:     Critical care time (minutes):  44   Critical care time was exclusive of:  Separately billable procedures and treating other patients and teaching time   Critical care was necessary to treat or prevent imminent or life-threatening deterioration of the following conditions:  Respiratory failure   Critical care was time spent personally by me on the following activities:  Blood draw for specimens, development of treatment plan with patient or surrogate, discussions with consultants, evaluation of patient's response to treatment, examination of patient, obtaining history from patient or surrogate, ordering and performing treatments and interventions, ordering and review of laboratory studies, ordering and review of radiographic studies, pulse oximetry, re-evaluation of patient's condition and review of old charts   (including critical care time)  Medications Ordered in ED Medications  cefTRIAXone (ROCEPHIN) 2 g in sodium chloride 0.9 % 100 mL IVPB (has no administration in time range)  azithromycin (ZITHROMAX) 500 mg in sodium chloride 0.9 % 250 mL IVPB (has no administration in time range)  furosemide (LASIX) injection 40 mg (has no administration in time range)  methylPREDNISolone sodium succinate (SOLU-MEDROL) 125 mg/2 mL injection 125 mg (125 mg Intravenous Given 02/20/20 1426)  albuterol (PROVENTIL) (2.5 MG/3ML) 0.083% nebulizer solution 5 mg (5 mg Nebulization Given 02/20/20 1437)  ipratropium (ATROVENT) nebulizer solution 0.5 mg (0.5 mg Nebulization Given 02/20/20 1437)  iohexol (OMNIPAQUE) 350 MG/ML injection 75 mL (75 mLs Intravenous Contrast Given 02/20/20 1623)    ED Course  I have reviewed the triage vital signs and the nursing notes.  Pertinent labs & imaging results that were available during my care of the patient were reviewed by me and considered in my medical decision making (see chart for details).    MDM Rules/Calculators/A&P                     This is shared ED visit with  attending Dr. Sedonia Small.  He personally saw and evaluated patient.  Patient seen and examined upon arrival to exam room. Patient presents awake, alert. She is in respiratory distress on arrival, hypoxic to 60% on room air.  Placed on 15 L nonrebreather with improvement to 94%.  Lung sounds are significantly diminished in left right posterior lobe.  Otherwise diminished throughout.  Respiratory rate is 40 breaths/min.  Breathing is shallow.  She has 2+ bilateral pitting edema unchanged from baseline per patient. DDX includes asthma exacerbation, CHF exacerbation, pneumonia, Covid, pulmonary edema, PE, ACS.  Patient placed on BiPAP with respiratory improvement.  I-stat Blood gas was collected very shortly after patient placed on BiPAP and shows pH 7.156 and PCO2 of 94.4.  After being on BiPAP she does appear to be improving.  EKG shows sinus rhythm without ischemic changes. Troponin is 12. BNP elevated at 562.6. CBC shows leukocytosis of 13.4 and hemoglobin of 10.3.  Hemoglobin actually appears better than baseline. CMP without severe electrolyte derangement.  She is hyperglycemic to 156.  BUN/creatinine today is 26/1.24, this appears slightly better than recent labs x3 months ago. Covid  PCR test is negative.  Chest xray viewed by me shows ill-defined opacity lateral left base concerning for pneumonia. CTA chest is negative for PE but does show possible infection vs edema with patchy ground-glass and consolidative opacities throughout the lungs bilaterally which may represent edema or infection. Will cover for possible infection with azithromycin and Rocephin. IV lasix ordered for diuresis. Repeat abg shows pH 7.217, PCO2 75.5.   Spoke with Dr. Oletta Darter with critical care service who agrees to assume care of patient and bring into the hospital for further evaluation and management.     Portions of this note were generated with [employer]. Dictation errors may occur despite best attempts at  proofreading.   Final Clinical Impression(s) / ED Diagnoses Final diagnoses:  Acute pulmonary edema [facility])    Rx / DC Orders ED Discharge Orders    None       Flint Melter 02/20/20 2043    Maudie Flakes, MD 02/26/20 [phone]

## 2020-02-21 ENCOUNTER — Inpatient Hospital Stay (HOSPITAL_COMMUNITY): Payer: BC Managed Care – PPO

## 2020-02-21 ENCOUNTER — Encounter (HOSPITAL_COMMUNITY): Payer: Self-pay | Admitting: Pulmonary Disease

## 2020-02-21 ENCOUNTER — Other Ambulatory Visit: Payer: Self-pay

## 2020-02-21 DIAGNOSIS — I361 Nonrheumatic tricuspid (valve) insufficiency: Secondary | ICD-10-CM

## 2020-02-21 DIAGNOSIS — J810 Acute pulmonary edema: Secondary | ICD-10-CM

## 2020-02-21 LAB — CBC
HCT: 34.1 % — ABNORMAL LOW (ref 36.0–46.0)
Hemoglobin: 9.9 g/dL — ABNORMAL LOW (ref 12.0–15.0)
MCH: 29.1 pg (ref 26.0–34.0)
MCHC: 29 g/dL — ABNORMAL LOW (ref 30.0–36.0)
MCV: 100.3 fL — ABNORMAL HIGH (ref 80.0–100.0)
Platelets: 178 10*3/uL (ref 150–400)
RBC: 3.4 MIL/uL — ABNORMAL LOW (ref 3.87–5.11)
RDW: 14.8 % (ref 11.5–15.5)
WBC: 10 10*3/uL (ref 4.0–10.5)
nRBC: 1.1 % — ABNORMAL HIGH (ref 0.0–0.2)

## 2020-02-21 LAB — BASIC METABOLIC PANEL
Anion gap: 12 (ref 5–15)
Anion gap: 7 (ref 5–15)
Anion gap: 10 (ref 5–15)
BUN: 30 mg/dL — ABNORMAL HIGH (ref 8–23)
BUN: 35 mg/dL — ABNORMAL HIGH (ref 8–23)
BUN: 39 mg/dL — ABNORMAL HIGH (ref 8–23)
CO2: 28 mmol/L (ref 22–32)
CO2: 33 mmol/L — ABNORMAL HIGH (ref 22–32)
CO2: 33 mmol/L — ABNORMAL HIGH (ref 22–32)
Calcium: 9.3 mg/dL (ref 8.9–10.3)
Calcium: 9.3 mg/dL (ref 8.9–10.3)
Calcium: 9.6 mg/dL (ref 8.9–10.3)
Chloride: 103 mmol/L (ref 98–111)
Chloride: 101 mmol/L (ref 98–111)
Chloride: 101 mmol/L (ref 98–111)
Creatinine, Ser: 1.34 mg/dL — ABNORMAL HIGH (ref 0.44–1.00)
Creatinine, Ser: 1.39 mg/dL — ABNORMAL HIGH (ref 0.44–1.00)
Creatinine, Ser: 1.6 mg/dL — ABNORMAL HIGH (ref 0.44–1.00)
GFR calc Af Amer: 48 mL/min — ABNORMAL LOW (ref >60)
GFR calc Af Amer: 46 mL/min — ABNORMAL LOW (ref >60)
GFR calc Af Amer: 39 mL/min — ABNORMAL LOW (ref >60)
GFR calc non Af Amer: 42 mL/min — ABNORMAL LOW (ref >60)
GFR calc non Af Amer: 40 mL/min — ABNORMAL LOW (ref >60)
GFR calc non Af Amer: 34 mL/min — ABNORMAL LOW (ref >60)
Glucose, Bld: 117 mg/dL — ABNORMAL HIGH (ref 70–99)
Glucose, Bld: 123 mg/dL — ABNORMAL HIGH (ref 70–99)
Glucose, Bld: 127 mg/dL — ABNORMAL HIGH (ref 70–99)
Potassium: 5.9 mmol/L — ABNORMAL HIGH (ref 3.5–5.1)
Potassium: 4.9 mmol/L (ref 3.5–5.1)
Potassium: 4.7 mmol/L (ref 3.5–5.1)
Sodium: 143 mmol/L (ref 135–145)
Sodium: 141 mmol/L (ref 135–145)
Sodium: 144 mmol/L (ref 135–145)

## 2020-02-21 LAB — ECHOCARDIOGRAM COMPLETE
Height: 64 in
Weight: 4313.96 oz

## 2020-02-21 LAB — PHOSPHORUS: Phosphorus: 5.5 mg/dL — ABNORMAL HIGH (ref 2.5–4.6)

## 2020-02-21 LAB — MRSA PCR SCREENING: MRSA by PCR: NEGATIVE

## 2020-02-21 LAB — MAGNESIUM: Magnesium: 2.2 mg/dL (ref 1.7–2.4)

## 2020-02-21 MED ORDER — CALCIUM GLUCONATE-NACL 2-0.675 GM/100ML-% IV SOLN
2.0000 g | Freq: Once | INTRAVENOUS | Status: AC
  Administered 2020-02-21: 12:00:00 2000 mg via INTRAVENOUS
  Filled 2020-02-21: qty 100

## 2020-02-21 MED ORDER — PERFLUTREN LIPID MICROSPHERE
1.0000 mL | INTRAVENOUS | Status: AC | PRN
  Administered 2020-02-21: 11:00:00 3 mL via INTRAVENOUS
  Filled 2020-02-21: qty 10

## 2020-02-21 MED ORDER — CHLORHEXIDINE GLUCONATE CLOTH 2 % EX PADS
6.0000 | MEDICATED_PAD | Freq: Every day | CUTANEOUS | Status: DC
  Administered 2020-02-21 – 2020-02-26 (×5): 6 via TOPICAL

## 2020-02-21 MED ORDER — SODIUM ZIRCONIUM CYCLOSILICATE 10 G PO PACK
10.0000 g | PACK | Freq: Three times a day (TID) | ORAL | Status: AC
  Administered 2020-02-21 (×3): 10 g via ORAL
  Filled 2020-02-21: qty 1
  Filled 2020-02-21: qty 2
  Filled 2020-02-21: qty 1

## 2020-02-21 MED ORDER — FUROSEMIDE 10 MG/ML IJ SOLN
60.0000 mg | Freq: Once | INTRAMUSCULAR | Status: AC
  Administered 2020-02-21: 12:00:00 60 mg via INTRAVENOUS
  Filled 2020-02-21: qty 6

## 2020-02-21 MED ORDER — SODIUM CHLORIDE 0.9 % IV SOLN
500.0000 mg | Freq: Once | INTRAVENOUS | Status: AC
  Administered 2020-02-21: 13:00:00 500 mg via INTRAVENOUS
  Filled 2020-02-21 (×2): qty 500

## 2020-02-21 NOTE — Progress Notes (Signed)
Patient refuses Bipap states she cannot tolerate wearing it.

## 2020-02-21 NOTE — Progress Notes (Signed)
  Echocardiogram 2D Echocardiogram has been performed.  Bridget Lamb 02/21/2020, 10:54 AM

## 2020-02-21 NOTE — Progress Notes (Signed)
NAME:  Bridget Lamb, MRN:  [id number], DOB:  [birth year], LOS: 1 ADMISSION DATE:  02/20/2020, CONSULTATION DATE:  02/20/20 REFERRING MD:  Sedonia Small, CHIEF COMPLAINT:  SHOB   Brief History   Bridget Lamb is a 64 y.o. female with a pertinent PMH of HTN, HLD, hypothyroidism, HFpEF, Endometrial cancer, who was admitted with acute hypoxic and hypercapnic respiratory failure felt to be 2/2 acute pulmonary edema.  History of present illness   Bridget Lamb is a 64 y.o. female who has a PMH as outlined below.  She presented to [facility] ED 4/28 with dyspnea and hypoxia.  She was seen at PCP earlier in the day for chest tightness and found to have sats in the 50's on room air.  In ED, she was placed on 15L NRB with improvement to 90's but was later changed to BiPAP due to slightly increased WOB. Initial ABG showed 7.156/94/314 on 100% FiO2 >>7.22/74/66 on 40%.  CTA was neg for PE but did show edema and atelectasis. BNP elevated at 562 form 164 in Nov 2019. PCCM was called for admission.  COVID negative.  Lasix given on admission with 1.3L of urine output overnight.   Past Medical History  has Essential hypertension, benign; Dyslipidemia; Hypothyroidism; Anemia; Obesity, Class III, BMI 40-49.9 (morbid obesity) ([place]); Chronic diastolic CHF (congestive heart failure) ([place]); Iron deficiency anemia; Asthma, chronic, unspecified asthma severity, with acute exacerbation; Bilateral leg edema; Chronic heart failure with preserved ejection fraction ([place]); Endometrial cancer ([place]); and Acute pulmonary edema ([place]) on their problem list.  Significant Hospital Events   4/28 > admit.  Consults:  none  Procedures:  none  Significant Diagnostic Tests:  CTA chest 4/28 > neg for PE.  Edema bilaterally, Patchy GGO's, atelectasis, mod effusions. 1. No evidence for acute pulmonary embolus. 2. Patchy ground-glass and consolidative opacities throughout the aerated lungs bilaterally which may represent edema or  infection. Additionally, there are scattered areas of consolidation within the lungs bilaterally which may represent atelectasis or infection. Consider follow-up chest CT in 6-8 weeks after resolution of acute symptomatology to ensure the nodularity resolves. 3. Moderate bilateral pleural effusions. 4. Prominent mediastinal lymph nodes, likely reactive. 5. Dilated main pulmonary artery as can be seen with pulmonary arterial hypertension. 6. Aortic atherosclerosis.  4/29 CXR>  Micro Data:  Flu 4/28 > neg. COVID 4/28 > neg.  Antimicrobials:  Ceftriaxone and Azithro x 1 in ED.    Interim history/subjective:  Tolerating BiPAP at 18/8  Objective   Blood pressure (!) 167/63, pulse 84, temperature 97.9 F (36.6 C), temperature source Oral, resp. rate 15, height '5\' 4"'$  (1.626 m), weight 122.3 kg, last menstrual period 10/22/2011, SpO2 97 %.    Vent Mode: BIPAP;PCV FiO2 (%):  [40 %-100 %] 40 % Set Rate:  [8 bmp-16 bmp] 16 bmp PEEP:  [8 cmH20] 8 cmH20   Intake/Output Summary (Last 24 hours) at 02/21/2020 0553 Last data filed at 02/21/2020 0300 Gross per 24 hour  Intake --  Output 1300 ml  Net -1300 ml   Filed Weights   02/21/20 0017 02/21/20 0343  Weight: 122.3 kg 122.3 kg    Examination: General: Adult female, in NAD. Neuro: Awake, answers basic questions appropriately. HEENT: [place]/AT. Sclerae anicteric.  BiPAP in place. Cardiovascular: RRR, no M/R/G.  Lungs: Respirations even and unlabored.  CTA bilaterally,  Patient has bilateral wheezing and some rales Abdomen: Obese.  BS x 4, soft, NT/ND.  Musculoskeletal: No gross deformities, 2+ edema to knees. Skin: Intact, warm, no  rashes.  Resolved Hospital Problem list     Assessment & Plan:  Acute pulmonary edema - unclear etiology, ? Due to hypertensive urgency.  Acute hypoxic and hypercapnic respiratory failure - 2/2 above. - Continue BiPAP as needed - 60 mg lasix. With 1.3 L out overnight, repeat 60 mg today and reassesses  volume status. - Might consider nitro gtt, but hold for now as already has improved on BiPAP and expect further improvement after lasix.   Hyperkalemia: - No EKG changes - will give calcium gluconate, lokelma and lasix. - Recheck BMP  Hypertensive urgency. - Hydralazine PRN. - Continue home nifedipine. - Hold home lisinopril.  AKI. Mild hyperkalemia. - Lasix as above. - Follow BMP.  Hx hypothyroidism. - Continue home synthroid. - Check TSH.  Hx endometrial CA - s/p TRH / BSO. - f/u with GYN ONC as outpatient.  Best practice:  Diet: Heart healthy. . Pain/Anxiety/Delirium protocol (if indicated): N/A. VAP protocol (if indicated): n/A. DVT prophylaxis: SCD's / Heparin. GI prophylaxis: N/A. Glucose control: SSI if glucose consistently > 180. Mobility: Bedrest. Code Status: Full. Family Communication: None. Disposition: ICU.  Labs   CBC: Recent Labs  Lab 02/20/20 1253 02/20/20 1312 02/20/20 1823 02/21/20 0419  WBC 13.4*  --   --  10.0  NEUTROABS 11.0*  --   --   --   HGB 10.3* 11.6* 11.2* 9.9*  HCT 36.2 34.0* 33.0* 34.1*  MCV 100.6*  --   --  100.3*  PLT 220  --   --  295    Basic Metabolic Panel: Recent Labs  Lab 02/20/20 1253 02/20/20 1312 02/20/20 1823 02/21/20 0419  NA 141 140 140 143  K 4.8 4.9 5.4* 5.9*  CL 104  --   --  103  CO2 24  --   --  28  GLUCOSE 156*  --   --  117*  BUN 26*  --   --  30*  CREATININE 1.24*  --   --  1.34*  CALCIUM 9.4  --   --  9.3  MG  --   --   --  2.2  PHOS  --   --   --  5.5*   GFR: Estimated Creatinine Clearance: 54.7 mL/min (A) (by C-G formula based on SCr of 1.34 mg/dL (H)). Recent Labs  Lab 02/20/20 1253 02/21/20 0419  WBC 13.4* 10.0    Liver Function Tests: Recent Labs  Lab 02/20/20 1253  AST 20  ALT 16  ALKPHOS 83  BILITOT 0.8  PROT 7.5  ALBUMIN 3.9   No results for input(s): LIPASE, AMYLASE in the last 168 hours. No results for input(s): AMMONIA in the last 168 hours.  ABG      Component Value Date/Time   PHART 7.224 (L) 02/20/2020 1823   PCO2ART 73.8 (HH) 02/20/2020 1823   PO2ART 66 (L) 02/20/2020 1823   HCO3 30.7 (H) 02/20/2020 1823   TCO2 33 (H) 02/20/2020 1823   O2SAT 88.0 02/20/2020 1823     Coagulation Profile: No results for input(s): INR, PROTIME in the last 168 hours.  Cardiac Enzymes: No results for input(s): CKTOTAL, CKMB, CKMBINDEX, TROPONINI in the last 168 hours.  HbA1C: No results found for: HGBA1C  CBG: No results for input(s): GLUCAP in the last 168 hours.  Review of Systems:   Abdominal bloating, lower extremity edema  Past Medical History  She,  has a past medical history of Allergic rhinitis, Anemia, Asthma, CHF (congestive heart failure) ([place]) (2004), Chronic kidney  disease, Complication of anesthesia, Dyslipidemia, Dyspnea, Endometrial cancer ([place]), H/O echocardiogram (01/07/04), History of thyroid cancer, Hypertension, Incarcerated ventral hernia (10/07/2013), Obesity, Class III, BMI 40-49.9 (morbid obesity) ([place]) (10/09/2013), Osteopenia, PONV (postoperative nausea and vomiting), Umbilical hernia, Unspecified essential hypertension (10/09/2013), and Unspecified hypothyroidism (10/09/2013).   Surgical History    Past Surgical History:  Procedure Laterality Date  . BONE MARROW BIOPSY    . CARDIAC CATHETERIZATION  2005  . CHOLECYSTECTOMY  2003  . COLONOSCOPY  2008  . ROBOTIC ASSISTED TOTAL HYSTERECTOMY WITH BILATERAL SALPINGO OOPHERECTOMY N/A 10/25/2019   Procedure: XI ROBOTIC ASSISTED TOTAL HYSTERECTOMY WITH BILATERAL SALPINGO OOPHORECTOMY;  Surgeon: Lafonda Mosses, MD;  Location: [facility];  Service: Gynecology;  Laterality: N/A;  . SENTINEL NODE BIOPSY N/A 10/25/2019   Procedure: SENTINEL LYMPH NODE BIOPSY;  Surgeon: Lafonda Mosses, MD;  Location: [facility];  Service: Gynecology;  Laterality: N/A;  . THYROID SURGERY     partial thyroidectomy  . TOTAL THYROIDECTOMY  2009  . VENTRAL HERNIA REPAIR N/A 10/07/2013    Procedure: HERNIA REPAIR VENTRAL ADULT;  Surgeon: Gwenyth Ober, MD;  Location: [facility];  Service: General;  Laterality: N/A;     Social History   reports that she has never smoked. She has never used smokeless tobacco. She reports that she does not drink alcohol or use drugs.   Family History   Her family history includes Breast cancer in her maternal aunt; Hypertension in her mother; Lung cancer in her father; Pancreatic cancer in her mother. There is no history of Ovarian cancer, Endometrial cancer, or Colon cancer.   Allergies No Known Allergies   Home Medications  Prior to Admission medications   Medication Sig Start Date End Date Taking? Authorizing Provider  albuterol (PROVENTIL HFA;VENTOLIN HFA) 108 (90 Base) MCG/ACT inhaler Inhale 2 puffs into the lungs every 4 (four) hours as needed for wheezing or shortness of breath. 09/01/18   Aline August, MD  albuterol (PROVENTIL) (2.5 MG/3ML) 0.083% nebulizer solution Take 2.5 mg by nebulization every 6 (six) hours as needed for wheezing or shortness of breath.    [provider]  aspirin EC 81 MG tablet Take 81 mg by mouth daily.    [provider]  Calcium Carbonate-Vitamin D (CALCIUM 600+D PO) Take 1 tablet by mouth daily.    [provider]  Fe Fum-FePoly-Vit C-Vit B3 (INTEGRA PO) Take 1 tablet by mouth daily.    [provider]  levothyroxine (SYNTHROID, LEVOTHROID) 150 MCG tablet Take 150 mcg by mouth daily before breakfast.     [provider]  lisinopril (PRINIVIL,ZESTRIL) 20 MG tablet Take 20 mg by mouth daily.    [provider]  loratadine (CLARITIN) 10 MG tablet Take 10 mg by mouth every morning.    [provider]  NIFEdipine (PROCARDIA-XL/ADALAT-CC/NIFEDICAL-XL) 30 MG 24 hr tablet Take 30 mg by mouth daily.    [provider]  olopatadine (PATANOL) 0.1 % ophthalmic solution Place 1 drop into both eyes 2 (two) times daily. 04/07/18   [provider]    rosuvastatin (CRESTOR) 10 MG tablet Take 10 mg by mouth at bedtime. 11/28/17   [provider]     Critical care time:      Marianna Payment, D.O. [facility], PGY-1 Pager: [phone], Phone: [phone] Date 02/21/2020 Time 11:25 AM

## 2020-02-22 DIAGNOSIS — N1832 Chronic kidney disease, stage 3b: Secondary | ICD-10-CM

## 2020-02-22 DIAGNOSIS — I10 Essential (primary) hypertension: Secondary | ICD-10-CM

## 2020-02-22 DIAGNOSIS — G4733 Obstructive sleep apnea (adult) (pediatric): Secondary | ICD-10-CM

## 2020-02-22 DIAGNOSIS — J90 Pleural effusion, not elsewhere classified: Secondary | ICD-10-CM

## 2020-02-22 DIAGNOSIS — J9601 Acute respiratory failure with hypoxia: Secondary | ICD-10-CM

## 2020-02-22 DIAGNOSIS — D631 Anemia in chronic kidney disease: Secondary | ICD-10-CM

## 2020-02-22 DIAGNOSIS — D509 Iron deficiency anemia, unspecified: Secondary | ICD-10-CM

## 2020-02-22 DIAGNOSIS — I5033 Acute on chronic diastolic (congestive) heart failure: Secondary | ICD-10-CM

## 2020-02-22 DIAGNOSIS — J9602 Acute respiratory failure with hypercapnia: Secondary | ICD-10-CM

## 2020-02-22 LAB — IRON AND TIBC
Iron: 26 ug/dL — ABNORMAL LOW (ref 28–170)
Saturation Ratios: 9 % — ABNORMAL LOW (ref 10.4–31.8)
TIBC: 288 ug/dL (ref 250–450)
UIBC: 262 ug/dL

## 2020-02-22 LAB — CBC
HCT: 30.2 % — ABNORMAL LOW (ref 36.0–46.0)
Hemoglobin: 9 g/dL — ABNORMAL LOW (ref 12.0–15.0)
MCH: 28.8 pg (ref 26.0–34.0)
MCHC: 29.8 g/dL — ABNORMAL LOW (ref 30.0–36.0)
MCV: 96.5 fL (ref 80.0–100.0)
Platelets: 164 10*3/uL (ref 150–400)
RBC: 3.13 MIL/uL — ABNORMAL LOW (ref 3.87–5.11)
RDW: 14.8 % (ref 11.5–15.5)
WBC: 9.5 10*3/uL (ref 4.0–10.5)
nRBC: 0.2 % (ref 0.0–0.2)

## 2020-02-22 LAB — BASIC METABOLIC PANEL
Anion gap: 10 (ref 5–15)
BUN: 39 mg/dL — ABNORMAL HIGH (ref 8–23)
CO2: 32 mmol/L (ref 22–32)
Calcium: 9.2 mg/dL (ref 8.9–10.3)
Chloride: 98 mmol/L (ref 98–111)
Creatinine, Ser: 1.3 mg/dL — ABNORMAL HIGH (ref 0.44–1.00)
GFR calc Af Amer: 50 mL/min — ABNORMAL LOW (ref >60)
GFR calc non Af Amer: 43 mL/min — ABNORMAL LOW (ref >60)
Glucose, Bld: 105 mg/dL — ABNORMAL HIGH (ref 70–99)
Potassium: 4.3 mmol/L (ref 3.5–5.1)
Sodium: 140 mmol/L (ref 135–145)

## 2020-02-22 LAB — FOLATE: Folate: 28.7 ng/mL (ref >5.9)

## 2020-02-22 LAB — RETICULOCYTES
Immature Retic Fract: 32.2 % — ABNORMAL HIGH (ref 2.3–15.9)
RBC.: 3.05 MIL/uL — ABNORMAL LOW (ref 3.87–5.11)
Retic Count, Absolute: 88.1 10*3/uL (ref 19.0–186.0)
Retic Ct Pct: 2.9 % (ref 0.4–3.1)

## 2020-02-22 LAB — VITAMIN B12: Vitamin B-12: 347 pg/mL (ref 180–914)

## 2020-02-22 LAB — FERRITIN: Ferritin: 80 ng/mL (ref 11–307)

## 2020-02-22 MED ORDER — FUROSEMIDE 10 MG/ML IJ SOLN
40.0000 mg | Freq: Two times a day (BID) | INTRAMUSCULAR | Status: DC
  Administered 2020-02-22 – 2020-02-25 (×7): 40 mg via INTRAVENOUS
  Filled 2020-02-22 (×7): qty 4

## 2020-02-22 MED ORDER — SENNOSIDES-DOCUSATE SODIUM 8.6-50 MG PO TABS: 1.0000 | ORAL_TABLET | Freq: Two times a day (BID) | ORAL | Status: DC | PRN

## 2020-02-22 MED ORDER — SODIUM CHLORIDE 0.9 % IV SOLN
510.0000 mg | Freq: Once | INTRAVENOUS | Status: AC
  Administered 2020-02-22: 510 mg via INTRAVENOUS
  Filled 2020-02-22: qty 17

## 2020-02-22 MED ORDER — POLYETHYLENE GLYCOL 3350 17 G PO PACK: 17.0000 g | PACK | Freq: Two times a day (BID) | ORAL | Status: DC | PRN

## 2020-02-22 MED ORDER — FERROUS SULFATE 325 (65 FE) MG PO TABS
325.0000 mg | ORAL_TABLET | Freq: Two times a day (BID) | ORAL | Status: DC
  Administered 2020-02-22 – 2020-02-26 (×8): 325 mg via ORAL
  Filled 2020-02-22 (×8): qty 1

## 2020-02-22 NOTE — Progress Notes (Signed)
Pt stated she cannot tolerate wearing a CPAP while in hospital. Pt stated the physician asked her to try and she felt like she could not do it.

## 2020-02-22 NOTE — Progress Notes (Addendum)
[id number]: Patient SpO2 dropped to high 70s while sleeping on 2L [place]. Increased O2 to 2.5L [place]. SpO2 increased to 93%. RT on floor and notified of change face to face. Patient resting comfortably, will continue to monitor.  [id number]: patient maintaining SpO2 sat of 85% increased O2 to 3.5L [place].  Patient denies discomfort or SOB. Will continue to monitor.

## 2020-02-22 NOTE — Progress Notes (Signed)
Patient has OSA and refuses to wear Bipap. Her sats have varied from 0100 to 0355= 95% down to the lower 70's. The Nurse and RT have asked several times if she would consider putting on the Bipap and the pt continues to state no.

## 2020-02-22 NOTE — Progress Notes (Signed)
PROGRESS NOTE  Bridget Lamb [id number] DOB: May 13, 1956   PCP: [name], PA-C  Patient is from: Home.  DOA: 02/20/2020 LOS: 2  Brief Narrative / Interim history: 64 year old female with history of diastolic CHF, obesity, OSA not on CPAP, HTN, hypothyroidism, asthma, IDA and endometrial cancer s/p TRH/BSO who presented to [facility] on 4/28 with dyspnea and hypoxia.  She presented to PCP with chest tightness and found to have saturation in the 50s on room air and sent to ED.  In ED, placed on 15 L NRB> BiPAP.  ABG 7.1 5/94/314/36 on 100% FiO2>> 7.2 2/74/66/33 on 40%.  BNP 562 from 164 in 2019.  WBC 13.4.  Influenza and COVID-19 PCR negative.  CXR with left base opacity.  EKG sinus rhythm at 95/min.  No acute ischemic finding.  CTA chest negative for PE but revealed pulmonary edema and atelectasis.  Patient was admitted to ICU for acute respiratory failure due to acute pulmonary edema in the setting of uncontrolled hypertension and diastolic CHF exacerbation.  She was diuresed with IV Lasix with improvement in her breathing.  She was transferred to [facility] service on 4/30.   Subjective: Seen and examined earlier this morning.  Heart intermittent desaturation to 70s requiring up titration of oxygen overnight.  She is currently in mid 90s on 5 L by HFNC.  She refused to wear BiPAP/CPAP overnight.  She says she is somewhat claustrophobic but willing to try with nose mask tonight.  Reports improvement in her breathing.  She denies chest pain, GI or UTI symptoms.  Denies melena or hematochezia.  Reports having normal colonoscopy few years ago.  Denies taking NSAID other than low-dose aspirin.  Objective: Vitals:   02/22/20 0344 02/22/20 0356 02/22/20 0420 02/22/20 0817  BP:   (!) 131/49 (!) 123/55  Pulse: 76  81 75  Resp: 18  (!) 21 17  Temp:   98.1 F (36.7 C) 98.2 F (36.8 C)  TempSrc:   Oral Oral  SpO2: 93% 95% 95% 96%  Weight:      Height:        Intake/Output Summary (Last 24  hours) at 02/22/2020 1115 Last data filed at 02/21/2020 2200 Gross per 24 hour  Intake 710.06 ml  Output 1625 ml  Net -914.94 ml   Filed Weights   02/21/20 0017 02/21/20 0343  Weight: 122.3 kg 122.3 kg    Examination:  GENERAL: No apparent distress.  Nontoxic. HEENT: MMM.  Vision and hearing grossly intact.  NECK: Supple.  No apparent JVD.  RESP: 95% on 5 L by HFNC.  No IWOB.  Diminished aeration bilaterally. CVS:  RRR. Heart sounds normal.  ABD/GI/GU: Bowel sounds present. Soft. Non tender.  MSK/EXT:  Moves extremities. No apparent deformity.  Trace edema. SKIN: no apparent skin lesion or wound NEURO: Sleepy but wakes to voice easily.oriented x4.  No apparent focal neuro deficit. PSYCH: Calm. Normal affect.   Procedures:  None  Microbiology summarized: COVID-19 PCR negative. Influenza PCR negative.  Assessment & Plan: Acute respiratory failure with hypoxia and hypercarbia-multifactorial including pulmonary edema, diastolic CHF, OSA and possible OHS.  Patient with diagnosis of OSA but not on CPAP.  Initial ABG on admission with acute respiratory acidosis with hypercarbia.  -Treat treatable causes as below. -Minimum oxygen to keep saturation above 88%. -Encouraged to try CPAP/BiPAP tonight.  She is going to try with nasal mask. -OOB/PT/OT  Acute on chronic diastolic CHF/pulmonary edema/bilateral pleural effusion: BNP elevated.  CTA chest reveals pulmonary edema.  Echo with EF of 60 to 65%, G2-DD and moderate LAE.  Responded to IV Lasix.  Had 1.6 L UOP during the day yesterday.  None charted overnight.  Renal function improved. -Resume IV Lasix at 40 mg twice daily -Monitor fluid status, renal function and electrolytes -Sodium and fluid restrictions -CXR after adequate diuresis to assess pleural effusion.  Uncontrolled hypertension: Normotensive.  On lisinopril and nifedipine at home.  Nifedipine might not be ideal in the setting of CHF -Continue nifedipine for now -Hold  lisinopril to allow room for diuretics -Continue IV Lasix as above.  OSA not on CPAP -Continue encouraging CPAP-willing to try with nasal mask tonight. -Minimum oxygen to maintain appropriate saturation.  CKD-3B/azotemia: Baseline Cr 1.3-1.5> 1.24 (admit)> 1.60> 1.3 -Continue monitoring   Hyperkalemia: Resolved.  Iron deficiency anemia: Baseline Hgb 8-9> 10.3 (admit)>> 9.0.  Denies melena or hematochezia.  Iron sat 9 9%.  TIBC 288.  Ferritin 80. -IV Feraheme on 4/30.  Will give additional dose in 3 to 4 days if still inpatient. -Start ferrous sulfate with bowel regimen -Monitor  Hypothyroidism: TSH within normal. -Continue home Synthroid  Morbid obesity:Body mass index is 46.28 kg/m. -Encourage lifestyle change to lose weight.  History of endometrial cancer s/p TRH/BSO -Outpatient follow-up with Gyn as needed.                  DVT prophylaxis: Subcu heparin Code Status: Full code Family Communication: Patient and/or RN. Available if any question.    Status is: Inpatient  Remains inpatient appropriate because:IV treatments appropriate due to intensity of illness or inability to take PO, Inpatient level of care appropriate due to severity of illness and high oxygen requirement and need for IV diuretics   Dispo: The patient is from: Home              Anticipated d/c is to: Home              Anticipated d/c date is: 3 days              Patient currently is not medically stable to d/c.          Consultants:  PCCM   Sch Meds:  Scheduled Meds: . Chlorhexidine Gluconate Cloth  6 each Topical Daily  . ferrous sulfate  325 mg Oral BID WC  . furosemide  40 mg Intravenous BID  . heparin  5,000 Units Subcutaneous Q8H  . levothyroxine  150 mcg Oral Q0600  . NIFEdipine  30 mg Oral Daily  . olopatadine  1 drop Both Eyes BID  . rosuvastatin  10 mg Oral Daily   Continuous Infusions: . ferumoxytol     PRN Meds:.albuterol, hydrALAZINE, polyethylene glycol,  senna-docusate  Antimicrobials: Anti-infectives (From admission, onward)   Start     Dose/Rate Route Frequency Ordered Stop   02/21/20 0945  azithromycin (ZITHROMAX) 500 mg in sodium chloride 0.9 % 250 mL IVPB     500 mg 250 mL/hr over 60 Minutes Intravenous  Once 02/21/20 0853 02/21/20 1353   02/20/20 1800  cefTRIAXone (ROCEPHIN) 2 g in sodium chloride 0.9 % 100 mL IVPB     2 g 200 mL/hr over 30 Minutes Intravenous  Once 02/20/20 1734 02/21/20 0020   02/20/20 1800  azithromycin (ZITHROMAX) 500 mg in sodium chloride 0.9 % 250 mL IVPB  Status:  Discontinued     500 mg 250 mL/hr over 60 Minutes Intravenous  Once 02/20/20 1734 02/21/20 0853       I have  personally reviewed the following labs and images: CBC: Recent Labs  Lab 02/20/20 1253 02/20/20 1312 02/20/20 1823 02/21/20 0419 02/22/20 0346  WBC 13.4*  --   --  10.0 9.5  NEUTROABS 11.0*  --   --   --   --   HGB 10.3* 11.6* 11.2* 9.9* 9.0*  HCT 36.2 34.0* 33.0* 34.1* 30.2*  MCV 100.6*  --   --  100.3* 96.5  PLT 220  --   --  178 164   BMP &GFR Recent Labs  Lab 02/20/20 1253 02/20/20 1312 02/20/20 1823 02/21/20 0419 02/21/20 1220 02/21/20 1703 02/22/20 0346  NA 141   < > 140 143 141 144 140  K 4.8   < > 5.4* 5.9* 4.9 4.7 4.3  CL 104  --   --  103 101 101 98  CO2 24  --   --  28 33* 33* 32  GLUCOSE 156*  --   --  117* 123* 127* 105*  BUN 26*  --   --  30* 35* 39* 39*  CREATININE 1.24*  --   --  1.34* 1.39* 1.60* 1.30*  CALCIUM 9.4  --   --  9.3 9.3 9.6 9.2  MG  --   --   --  2.2  --   --   --   PHOS  --   --   --  5.5*  --   --   --    < > = values in this interval not displayed.   Estimated Creatinine Clearance: 56.4 mL/min (A) (by C-G formula based on SCr of 1.3 mg/dL (H)). Liver & Pancreas: Recent Labs  Lab 02/20/20 1253  AST 20  ALT 16  ALKPHOS 83  BILITOT 0.8  PROT 7.5  ALBUMIN 3.9   No results for input(s): LIPASE, AMYLASE in the last 168 hours. No results for input(s): AMMONIA in the last 168  hours. Diabetic: No results for input(s): HGBA1C in the last 72 hours. No results for input(s): GLUCAP in the last 168 hours. Cardiac Enzymes: No results for input(s): CKTOTAL, CKMB, CKMBINDEX, TROPONINI in the last 168 hours. No results for input(s): PROBNP in the last 8760 hours. Coagulation Profile: No results for input(s): INR, PROTIME in the last 168 hours. Thyroid Function Tests: Recent Labs    02/20/20 2149  TSH 1.263   Lipid Profile: No results for input(s): CHOL, HDL, LDLCALC, TRIG, CHOLHDL, LDLDIRECT in the last 72 hours. Anemia Panel: Recent Labs    02/22/20 0726  VITAMINB12 347  FOLATE 28.7  FERRITIN 80  TIBC 288  IRON 26*  RETICCTPCT 2.9   Urine analysis:    Component Value Date/Time   COLORURINE YELLOW 10/22/2019 1153   APPEARANCEUR CLEAR 10/22/2019 1153   LABSPEC 1.010 10/22/2019 1153   PHURINE 6.0 10/22/2019 1153   GLUCOSEU NEGATIVE 10/22/2019 1153   HGBUR MODERATE (A) 10/22/2019 1153   BILIRUBINUR NEGATIVE 10/22/2019 1153   KETONESUR NEGATIVE 10/22/2019 1153   PROTEINUR NEGATIVE 10/22/2019 1153   UROBILINOGEN 1.0 10/06/2013 1752   NITRITE NEGATIVE 10/22/2019 1153   LEUKOCYTESUR NEGATIVE 10/22/2019 1153   Sepsis Labs: Invalid input(s): PROCALCITONIN, [place]  Microbiology: Recent Results (from the past 240 hour(s))  Respiratory Panel by RT PCR (Flu A&B, Covid) - Nasopharyngeal Swab     Status: None   Collection Time: 02/20/20 12:47 PM   Specimen: Nasopharyngeal Swab  Result Value Ref Range Status   SARS Coronavirus 2 by RT PCR NEGATIVE NEGATIVE Final    Comment: (NOTE)  SARS-CoV-2 target nucleic acids are NOT DETECTED. The SARS-CoV-2 RNA is generally detectable in upper respiratoy specimens during the acute phase of infection. The lowest concentration of SARS-CoV-2 viral copies this assay can detect is 131 copies/mL. A negative result does not preclude SARS-Cov-2 infection and should not be used as the sole basis for treatment or other  patient management decisions. A negative result may occur with  improper specimen collection/handling, submission of specimen other than nasopharyngeal swab, presence of viral mutation(s) within the areas targeted by this assay, and inadequate number of viral copies (<131 copies/mL). A negative result must be combined with clinical observations, patient history, and epidemiological information. The expected result is Negative. Fact Sheet for Patients:  PinkCheek.be Fact Sheet for Healthcare Providers:  GravelBags.it This test is not yet ap proved or cleared by the [place] FDA and  has been authorized for detection and/or diagnosis of SARS-CoV-2 by FDA under an Emergency Use Authorization (EUA). This EUA will remain  in effect (meaning this test can be used) for the duration of the COVID-19 declaration under Section 564(b)(1) of the Act, 21 U.S.C. section 360bbb-3(b)(1), unless the authorization is terminated or revoked sooner.    Influenza A by PCR NEGATIVE NEGATIVE Final   Influenza B by PCR NEGATIVE NEGATIVE Final    Comment: (NOTE) The Xpert Xpress SARS-CoV-2/FLU/RSV assay is intended as an aid in  the diagnosis of influenza from Nasopharyngeal swab specimens and  should not be used as a sole basis for treatment. Nasal washings and  aspirates are unacceptable for Xpert Xpress SARS-CoV-2/FLU/RSV  testing. Fact Sheet for Patients: PinkCheek.be Fact Sheet for Healthcare Providers: GravelBags.it This test is not yet approved or cleared by the [place] FDA and  has been authorized for detection and/or diagnosis of SARS-CoV-2 by  FDA under an Emergency Use Authorization (EUA). This EUA will remain  in effect (meaning this test can be used) for the duration of the  Covid-19 declaration under Section 564(b)(1) of the Act, 21  U.S.C. section 360bbb-3(b)(1), unless  the authorization is  terminated or revoked. Performed at [facility], [place] [address], [place], [place] [address]   MRSA PCR Screening     Status: None   Collection Time: 02/20/20 11:59 PM   Specimen: Nasopharyngeal  Result Value Ref Range Status   MRSA by PCR NEGATIVE NEGATIVE Final    Comment:        The GeneXpert MRSA Assay (FDA approved for NASAL specimens only), is one component of a comprehensive MRSA colonization surveillance program. It is not intended to diagnose MRSA infection nor to guide or monitor treatment for MRSA infections. Performed at [facility], [place] [address] [place], [place] [address]     Radiology Studies: No results found.    Chauncey Sciulli T. [place]  If 7PM-7AM, please contact night-coverage [email or web address] Password [email or web address] 02/22/2020, 11:15 AM

## 2020-02-23 LAB — RENAL FUNCTION PANEL
Albumin: 3.1 g/dL — ABNORMAL LOW (ref 3.5–5.0)
Anion gap: 10 (ref 5–15)
BUN: 37 mg/dL — ABNORMAL HIGH (ref 8–23)
CO2: 35 mmol/L — ABNORMAL HIGH (ref 22–32)
Calcium: 9.4 mg/dL (ref 8.9–10.3)
Chloride: 96 mmol/L — ABNORMAL LOW (ref 98–111)
Creatinine, Ser: 1.17 mg/dL — ABNORMAL HIGH (ref 0.44–1.00)
GFR calc Af Amer: 57 mL/min — ABNORMAL LOW (ref >60)
GFR calc non Af Amer: 49 mL/min — ABNORMAL LOW (ref >60)
Glucose, Bld: 100 mg/dL — ABNORMAL HIGH (ref 70–99)
Phosphorus: 4 mg/dL (ref 2.5–4.6)
Potassium: 3.7 mmol/L (ref 3.5–5.1)
Sodium: 141 mmol/L (ref 135–145)

## 2020-02-23 LAB — CBC
HCT: 29.9 % — ABNORMAL LOW (ref 36.0–46.0)
Hemoglobin: 8.8 g/dL — ABNORMAL LOW (ref 12.0–15.0)
MCH: 28.8 pg (ref 26.0–34.0)
MCHC: 29.4 g/dL — ABNORMAL LOW (ref 30.0–36.0)
MCV: 97.7 fL (ref 80.0–100.0)
Platelets: 173 10*3/uL (ref 150–400)
RBC: 3.06 MIL/uL — ABNORMAL LOW (ref 3.87–5.11)
RDW: 14.9 % (ref 11.5–15.5)
WBC: 7.4 10*3/uL (ref 4.0–10.5)
nRBC: 0 % (ref 0.0–0.2)

## 2020-02-23 LAB — MAGNESIUM: Magnesium: 1.8 mg/dL (ref 1.7–2.4)

## 2020-02-23 MED ORDER — ACETAMINOPHEN 325 MG PO TABS
650.0000 mg | ORAL_TABLET | Freq: Four times a day (QID) | ORAL | Status: DC | PRN
  Administered 2020-02-23: 650 mg via ORAL
  Filled 2020-02-23: qty 2

## 2020-02-23 NOTE — Evaluation (Addendum)
Occupational Therapy Evaluation Patient Details Name: Bridget Lamb MRN: [id number] DOB: [birth year] Today's Date: 02/23/2020    History of Present Illness Bridget Lamb is a 63 y.o. female who was admitted with acute hypoxic and hypercapnic respiratory failure felt to be 2/2 acute pulmonary edema. PMH includes HTN, obesity, endometrial cancer, CHF   Clinical Impression   PTA Pt living with family, independent and working at [employer]. At time of eval, pt presents with poor activity tolerance for BADL. Pt was able to complete BADL, transfer, and functional mobility at mod I- independent level. Pt with poor understanding of ECS Strategies, intensive education initiated. Pt informed of rest breaks, pursed lip breathing, pacing, and planning activities. Educated pt on self monitoring O2 sats, she does have pulse ox at home. On RA pt saturations 78% with functional mobility, up to 92% and above with 3L [place]. No post acute OT follow up indicated, but will continue to follow for ECS strategies while acute. Will follow per POC listed below.     Follow Up Recommendations  No OT follow up    Equipment Recommendations  None recommended by OT    Recommendations for Other Services       Precautions / Restrictions Precautions Precautions: None Precaution Comments: Watch O2 Restrictions Weight Bearing Restrictions: No      Mobility Bed Mobility               General bed mobility comments: up EOB  Transfers Overall transfer level: Independent                    Balance Overall balance assessment: No apparent balance deficits (not formally assessed)                                         ADL either performed or assessed with clinical judgement   ADL                                         General ADL Comments: Pt demonstrates ability to physically complete BADL At mod I level, but does show poor activity tolerance and  implementation of ECS skills. Pt was able to complete functional mobility a household distance without external assist, but did need cued rest breaks and education on self monitoring O2 sats     Vision Baseline Vision/History: Wears glasses Wears Glasses: At all times Patient Visual Report: No change from baseline       Perception     Praxis      Pertinent Vitals/Pain Pain Assessment: No/denies pain     Hand Dominance     Extremity/Trunk Assessment Upper Extremity Assessment Upper Extremity Assessment: Overall WFL for tasks assessed   Lower Extremity Assessment Lower Extremity Assessment: Overall WFL for tasks assessed       Communication Communication Communication: No difficulties   Cognition Arousal/Alertness: Awake/alert Behavior During Therapy: WFL for tasks assessed/performed Overall Cognitive Status: Within Functional Limits for tasks assessed                                     General Comments       Exercises     Shoulder Instructions      Home Living  Family/patient expects to be discharged to:: Private residence Living Arrangements: Children;Other relatives Available Help at Discharge: Family Type of Home: House Home Access: Stairs to enter [employer] of Steps: 1   Home Layout: Two level Alternate Level Stairs-Number of Steps: 13   Bathroom Shower/Tub: Occupational psychologist: Standard     Home Equipment: Shower seat          Prior Functioning/Environment Level of Independence: Independent        Comments: drives, wears glasses. Works in Therapist, art at [employer] List: Decreased knowledge of use of DME or AE;Cardiopulmonary status limiting activity;Decreased activity tolerance      OT Treatment/Interventions: Self-care/ADL training;Patient/family education;Energy conservation;Therapeutic activities;DME and/or AE instruction    OT Goals(Current goals can be found in the  care plan section) Acute Rehab OT Goals Patient Stated Goal: improve breathing and endurance OT Goal Formulation: With patient Time For Goal Achievement: 03/08/20 Potential to Achieve Goals: Good  OT Frequency: Min 2X/week   Barriers to D/C:            Co-evaluation PT/OT/SLP Co-Evaluation/Treatment: Yes Reason for Co-Treatment: To address functional/ADL transfers   OT goals addressed during session: ADL's and self-care;Other (comment)(ECS)      AM-PAC OT "6 Clicks" Daily Activity     Outcome Measure Help from another person eating meals?: None Help from another person taking care of personal grooming?: None Help from another person toileting, which includes using toliet, bedpan, or urinal?: None Help from another person bathing (including washing, rinsing, drying)?: None Help from another person to put on and taking off regular upper body clothing?: None Help from another person to put on and taking off regular lower body clothing?: None 6 Click Score: 24   End of Session Equipment Utilized During Treatment: Oxygen Nurse Communication: Mobility status  Activity Tolerance: Patient tolerated treatment well Patient left: in bed;with call bell/phone within reach  OT Visit Diagnosis: Other abnormalities of gait and mobility (R26.89)                [id number] OT Time Calculation (min): 25 min Charges:  OT General Charges $OT Visit: 1 Visit OT Evaluation $OT Eval Moderate Complexity: Allen, MSOT, OTR/L [facility] Office Number: [phone] Pager: [phone]  Zenovia Jarred 02/23/2020, 9:27 AM

## 2020-02-23 NOTE — Progress Notes (Addendum)
SATURATION QUALIFICATIONS: (This note is used to comply with regulatory documentation for home oxygen)  Patient Saturations on Room Air at Rest = 89%  Patient Saturations on Room Air while Ambulating = 78%  Patient Saturations on 3 Liters of oxygen while Ambulating = 92%  Please briefly explain why patient needs home oxygen: Pt quickly desaturates with minimal mobility on RA and currently requires 3L [place] to maintain appropriate saturations.    Zenovia Jarred, MSOT, OTR/L Acute Rehabilitation Services [facility] Office Number: [phone] Pager: [phone]

## 2020-02-23 NOTE — Progress Notes (Signed)
[facility] notified MEWS yellow. Will continue to monitor. Arthor Captain LPN

## 2020-02-23 NOTE — Plan of Care (Signed)

## 2020-02-23 NOTE — Evaluation (Signed)
Physical Therapy Evaluation Patient Details Name: Bridget Lamb MRN: [id number] DOB: 01/25/1956 Today's Date: 02/23/2020   History of Present Illness  Bridget Lamb is a 64 y.o. female who was admitted with acute hypoxic and hypercapnic respiratory failure felt to be 2/2 acute pulmonary edema. PMH includes HTN, obesity, endometrial cancer, CHF    Clinical Impression  Pt presented sitting EOB upon arrival, awake and willing to participate in therapy session. Prior to admission, pt reported that she was independent with all functional mobility and ADLs. Pt lives with her daughter and grandchildren in a two level home with one step to enter. At the time of evaluation, pt was overall at a supervision level with functional mobility, ambulating in hallway without use of an AD. Pt initially on 3L of O2 with SpO2 in the high 90's at rest. Ambulated without O2 and SpO2 decreasing to as low as 78% on RA. Therapist reapplied 3L of O2 and SpO2 maintained >91% throughout. PT will continue to follow pt acutely to progress mobility as tolerated per PT POC.     Follow Up Recommendations No PT follow up    Equipment Recommendations  None recommended by PT    Recommendations for Other Services       Precautions / Restrictions Precautions Precautions: Other (comment) Precaution Comments: Watch O2 Restrictions Weight Bearing Restrictions: No      Mobility  Bed Mobility               General bed mobility comments: pt seated EOB upon arrival  Transfers Overall transfer level: Independent Equipment used: None                Ambulation/Gait Ambulation/Gait assistance: Supervision Gait Distance (Feet): 150 Feet Assistive device: None Gait Pattern/deviations: Step-through pattern;Decreased stride length Gait velocity: decreased   General Gait Details: pt with slow, cautious but steady gait without use of an AD; initially on RA with SPO2 decreasing to as low as 78%; therapist  reapplied 3L of O2 with SpO2 maintaining >92% with activity  Stairs            Wheelchair Mobility    Modified Rankin (Stroke Patients Only)       Balance Overall balance assessment: No apparent balance deficits (not formally assessed)                                           Pertinent Vitals/Pain Pain Assessment: No/denies pain    Home Living Family/patient expects to be discharged to:: Private residence Living Arrangements: Children;Other relatives Available Help at Discharge: Family Type of Home: House Home Access: Stairs to enter   [employer] of Steps: 1 Home Layout: Two level Home Equipment: Shower seat      Prior Function Level of Independence: Independent         Comments: drives, wears glasses. Works in Therapist, art at [employer]        Extremity/Trunk Assessment   Upper Extremity Assessment Upper Extremity Assessment: Defer to OT evaluation;Overall [facility] for tasks assessed    Lower Extremity Assessment Lower Extremity Assessment: Overall WFL for tasks assessed       Communication   Communication: No difficulties  Cognition Arousal/Alertness: Awake/alert Behavior During Therapy: WFL for tasks assessed/performed Overall Cognitive Status: Within Functional Limits for tasks assessed  General Comments      Exercises     Assessment/Plan    PT Assessment Patient needs continued PT services  PT Problem List Decreased safety awareness;Decreased knowledge of precautions;Cardiopulmonary status limiting activity       PT Treatment Interventions DME instruction;Gait training;Stair training;Functional mobility training;Therapeutic activities;Therapeutic exercise;Balance training;Neuromuscular re-education;Patient/family education    PT Goals (Current goals can be found in the Care Plan section)  Acute Rehab PT Goals Patient Stated Goal:  improve breathing and endurance PT Goal Formulation: With patient Time For Goal Achievement: 03/08/20 Potential to Achieve Goals: Good    Frequency Min 3X/week   Barriers to discharge        Co-evaluation PT/OT/SLP Co-Evaluation/Treatment: Yes Reason for Co-Treatment: To address functional/ADL transfers;Other (comment)(tolerance) PT goals addressed during session: Mobility/safety with mobility;Balance;Strengthening/ROM OT goals addressed during session: ADL's and self-care;Other (comment)(ECS)       AM-PAC PT "6 Clicks" Mobility  Outcome Measure Help needed turning from your back to your side while in a flat bed without using bedrails?: None Help needed moving from lying on your back to sitting on the side of a flat bed without using bedrails?: None Help needed moving to and from a bed to a chair (including a wheelchair)?: None Help needed standing up from a chair using your arms (e.g., wheelchair or bedside chair)?: None Help needed to walk in hospital room?: None Help needed climbing 3-5 steps with a railing? : A Little 6 Click Score: 23    End of Session Equipment Utilized During Treatment: Oxygen Activity Tolerance: Patient tolerated treatment well Patient left: with call bell/phone within reach;Other (comment)(seated EOB) Nurse Communication: Mobility status PT Visit Diagnosis: Other abnormalities of gait and mobility (R26.89)    Time: [id number] PT Time Calculation (min) (ACUTE ONLY): 27 min   Charges:   PT Evaluation $PT Eval Moderate Complexity: 1 Mod          Eduard Clos, PT, DPT  Acute Rehabilitation Services Pager [phone] Office [place] 02/23/2020, 11:49 AM

## 2020-02-23 NOTE — Progress Notes (Signed)
PROGRESS NOTE    Bridget Lamb  [id number] DOB: 1955/11/10 DOA: 02/20/2020 PCP: [name], PA-C    Chief Complaint  Patient presents with  . Shortness of Breath    Brief Narrative: 64 year old female with history of diastolic CHF, obesity, OSA not on CPAP, HTN, hypothyroidism, asthma, IDA and endometrial cancer s/p TRH/BSO who presented to [facility] on 4/28 with dyspnea and hypoxia.  She presented to PCP with chest tightness and found to have saturation in the 50s on room air and sent to ED.  In ED, placed on 15 L NRB> BiPAP.  ABG 7.1 5/94/314/36 on 100% FiO2>> 7.2 2/74/66/33 on 40%.  BNP 562 from 164 in 2019.  WBC 13.4.  Influenza and COVID-19 PCR negative.  CXR with left base opacity.  EKG sinus rhythm at 95/min.  No acute ischemic finding.  CTA chest negative for PE but revealed pulmonary edema and atelectasis.  Patient was admitted to ICU for acute respiratory failure due to acute pulmonary edema in the setting of uncontrolled hypertension and diastolic CHF exacerbation.  She was diuresed with IV Lasix with improvement in her breathing.  She was transferred to [facility] service on 4/30. Assessment & Plan:   Active Problems:   Acute respiratory failure with hypoxia and hypercapnia (HCC)   Acute pulmonary edema (HCC)   Acute respiratory failure with hypoxia and hypercarbia-multifactorial including pulmonary edema, diastolic CHF, OSA and possible OHS.  Patient with diagnosis of OSA but not on CPAP.  Initial ABG on admission with acute respiratory acidosis with hypercarbia.  Patient refused CPAP last night. Titrate oxygen down to keep her saturation above 88%. Increase activity out of bed PT evaluation. Oxygen saturation on room air at rest is 89% and while ambulating is 78%.  She was placed on 3 L of oxygen sats came up to 92%. She LIKELY  will need home O2 upon discharge.  Acute on chronic diastolic CHF/pulmonary edema/bilateral pleural effusion: BNP elevated.  CTA chest  reveals pulmonary edema.  Echo with EF of 60 to 65%, G2-DD and moderate LAE. Negative by 4197 L.  Renal function improved. Continue IV Lasix at 40 mg twice daily -Monitor fluid status, renal function and electrolytes -Sodium and fluid restrictions -CXR after adequate diuresis to assess pleural effusion.  Uncontrolled hypertension: 122/72 .  Continue nifedipine.  Holding lisinopril to allow room for diuretics.  Continue Lasix 40 twice daily.   OSA not on CPAP-patient refused CPAP overnight. - CKD-3B/azotemia: Creatinine 1.17 down from 1.60.  Baseline creatinine between 1.3-1.5.  Continue to monitor on Lasix.    Hyperkalemia: Resolved.  Iron deficiency anemia: Baseline Hgb 8-9> 10.3 (admit)>> 9.0.  Denies melena or hematochezia.  Iron sat 9 9%.  TIBC 288.  Ferritin 80.HB 8.8  -IV Feraheme on 4/30.  Will give additional dose in 3 to 4 days if still inpatient. -Start ferrous sulfate with bowel regimen -Monitor  Hypothyroidism: TSH within normal. -Continue home Synthroid  Morbid obesity:Body mass index is 46.28 kg/m. -Encourage lifestyle change to lose weight.  History of endometrial cancer s/p TRH/BSO -Outpatient follow-up with Gyn as needed.   DVT prophylaxis: Subcu heparin Code Status: Full code Family Communication:  Patient Status is: Inpatient  Remains inpatient appropriate because:IV treatments appropriate due to intensity of illness  Inpatient level of care appropriate due to severity of illness and high oxygen requirement and need for IV diuretics   Dispo: The patient is from: Home  Anticipated d/c is to: Home  Anticipated d/c date is: 3 days  Patient currently is not medically stable to d/c.    Procedures: None Antimicrobials: Anti-infectives (From admission, onward)   Start     Dose/Rate Route Frequency Ordered Stop   02/21/20 0945  azithromycin (ZITHROMAX) 500 mg in sodium chloride 0.9 % 250 mL IVPB     500  mg 250 mL/hr over 60 Minutes Intravenous  Once 02/21/20 0853 02/21/20 1353   02/20/20 1800  cefTRIAXone (ROCEPHIN) 2 g in sodium chloride 0.9 % 100 mL IVPB     2 g 200 mL/hr over 30 Minutes Intravenous  Once 02/20/20 1734 02/21/20 0020   02/20/20 1800  azithromycin (ZITHROMAX) 500 mg in sodium chloride 0.9 % 250 mL IVPB  Status:  Discontinued     500 mg 250 mL/hr over 60 Minutes Intravenous  Once 02/20/20 1734 02/21/20 0853     Subjective:   Objective: Vitals:   02/23/20 0026 02/23/20 0245 02/23/20 0405 02/23/20 0754  BP: (!) 138/54 (!) 115/39 (!) 130/52 122/72  Pulse: 93 74 81   Resp: 20 18 (!) 23   Temp: 98.8 F (37.1 C) 98.1 F (36.7 C) 98.8 F (37.1 C) 98.7 F (37.1 C)  TempSrc: Oral Oral Oral Oral  SpO2:  93% 95%   Weight:      Height:        Intake/Output Summary (Last 24 hours) at 02/23/2020 0957 Last data filed at 02/22/2020 2100 Gross per 24 hour  Intake 117 ml  Output 2100 ml  Net -1983 ml   Filed Weights   02/21/20 0017 02/21/20 0343  Weight: 122.3 kg 122.3 kg    Examination:  General exam: Appears calm and comfortable  Respiratory system: scattered rhonchi to auscultation. Respiratory effort normal. Cardiovascular system: S1 & S2 heard, RRR. No JVD, murmurs, rubs, gallops or clicks. No pedal edema. Gastrointestinal system: Abdomen is nondistended, soft and nontender. No organomegaly or masses felt. Normal bowel sounds heard. Central nervous system: Alert and oriented. No focal neurological deficits. Extremities: 2+ pitting edema  skin: No rashes, lesions or ulcers Psychiatry: Judgement and insight appear normal. Mood & affect appropriate.     Data Reviewed: I have personally reviewed following labs and imaging studies  CBC: Recent Labs  Lab 02/20/20 1253 02/20/20 1253 02/20/20 1312 02/20/20 1823 02/21/20 0419 02/22/20 0346 02/23/20 0227  WBC 13.4*  --   --   --  10.0 9.5 7.4  NEUTROABS 11.0*  --   --   --   --   --   --   HGB 10.3*   < >  11.6* 11.2* 9.9* 9.0* 8.8*  HCT 36.2   < > 34.0* 33.0* 34.1* 30.2* 29.9*  MCV 100.6*  --   --   --  100.3* 96.5 97.7  PLT 220  --   --   --  178 164 173   < > = values in this interval not displayed.    Basic Metabolic Panel: Recent Labs  Lab 02/21/20 0419 02/21/20 1220 02/21/20 1703 02/22/20 0346 02/23/20 0227  NA 143 141 144 140 141  K 5.9* 4.9 4.7 4.3 3.7  CL 103 101 101 98 96*  CO2 28 33* 33* 32 35*  GLUCOSE 117* 123* 127* 105* 100*  BUN 30* 35* 39* 39* 37*  CREATININE 1.34* 1.39* 1.60* 1.30* 1.17*  CALCIUM 9.3 9.3 9.6 9.2 9.4  MG 2.2  --   --   --  1.8  PHOS 5.5*  --   --   --  4.0  GFR: Estimated Creatinine Clearance: 62.7 mL/min (A) (by C-G formula based on SCr of 1.17 mg/dL (H)).  Liver Function Tests: Recent Labs  Lab 02/20/20 1253 02/23/20 0227  AST 20  --   ALT 16  --   ALKPHOS 83  --   BILITOT 0.8  --   PROT 7.5  --   ALBUMIN 3.9 3.1*    CBG: No results for input(s): GLUCAP in the last 168 hours.   Recent Results (from the past 240 hour(s))  Respiratory Panel by RT PCR (Flu A&B, Covid) - Nasopharyngeal Swab     Status: None   Collection Time: 02/20/20 12:47 PM   Specimen: Nasopharyngeal Swab  Result Value Ref Range Status   SARS Coronavirus 2 by RT PCR NEGATIVE NEGATIVE Final    Comment: (NOTE) SARS-CoV-2 target nucleic acids are NOT DETECTED. The SARS-CoV-2 RNA is generally detectable in upper respiratoy specimens during the acute phase of infection. The lowest concentration of SARS-CoV-2 viral copies this assay can detect is 131 copies/mL. A negative result does not preclude SARS-Cov-2 infection and should not be used as the sole basis for treatment or other patient management decisions. A negative result may occur with  improper specimen collection/handling, submission of specimen other than nasopharyngeal swab, presence of viral mutation(s) within the areas targeted by this assay, and inadequate number of viral copies (<131 copies/mL).  A negative result must be combined with clinical observations, patient history, and epidemiological information. The expected result is Negative. Fact Sheet for Patients:  PinkCheek.be Fact Sheet for Healthcare Providers:  GravelBags.it This test is not yet ap proved or cleared by the [place] FDA and  has been authorized for detection and/or diagnosis of SARS-CoV-2 by FDA under an Emergency Use Authorization (EUA). This EUA will remain  in effect (meaning this test can be used) for the duration of the COVID-19 declaration under Section 564(b)(1) of the Act, 21 U.S.C. section 360bbb-3(b)(1), unless the authorization is terminated or revoked sooner.    Influenza A by PCR NEGATIVE NEGATIVE Final   Influenza B by PCR NEGATIVE NEGATIVE Final    Comment: (NOTE) The Xpert Xpress SARS-CoV-2/FLU/RSV assay is intended as an aid in  the diagnosis of influenza from Nasopharyngeal swab specimens and  should not be used as a sole basis for treatment. Nasal washings and  aspirates are unacceptable for Xpert Xpress SARS-CoV-2/FLU/RSV  testing. Fact Sheet for Patients: PinkCheek.be Fact Sheet for Healthcare Providers: GravelBags.it This test is not yet approved or cleared by the [place] FDA and  has been authorized for detection and/or diagnosis of SARS-CoV-2 by  FDA under an Emergency Use Authorization (EUA). This EUA will remain  in effect (meaning this test can be used) for the duration of the  Covid-19 declaration under Section 564(b)(1) of the Act, 21  U.S.C. section 360bbb-3(b)(1), unless the authorization is  terminated or revoked. Performed at [facility], [place] [address] [place], [place] [address]   MRSA PCR Screening     Status: None   Collection Time: 02/20/20 11:59 PM   Specimen: Nasopharyngeal  Result Value Ref Range Status   MRSA by PCR NEGATIVE  NEGATIVE Final    Comment:        The GeneXpert MRSA Assay (FDA approved for NASAL specimens only), is one component of a comprehensive MRSA colonization surveillance program. It is not intended to diagnose MRSA infection nor to guide or monitor treatment for MRSA infections. Performed at [facility], [place] [address],  [place], [place] [address]          Radiology Studies: ECHOCARDIOGRAM COMPLETE  Result Date: 02/21/2020    ECHOCARDIOGRAM REPORT   Patient Name:   MILANNA PATT Date of Exam: 02/21/2020 Medical Rec #:  [id number]           Height:       64.0 in Accession #:    [id number]          Weight:       269.6 lb Date of Birth:  20-Sep-1956           BSA:          2.221 m Patient Age:    71 years            BP:           158/68 mmHg Patient Gender: F                   HR:           73 bpm. Exam Location:  Inpatient Procedure: 2D Echo, Cardiac Doppler, Color Doppler and Intracardiac            Opacification Agent Indications:    Acute Pulmonary Edema [id number]  History:        Patient has prior history of Echocardiogram examinations, most                 recent 08/30/2018. CHF; Risk Factors:Hypertension and                 Dyslipidemia. Cancer. CKD.  Sonographer:    Jonelle Sidle Dance Referring Phys: [id number] RAHUL P DESAI IMPRESSIONS  1. Left ventricular ejection fraction, by estimation, is 60 to 65%. The left ventricle has normal function. The left ventricle has no regional wall motion abnormalities. There is mild concentric left ventricular hypertrophy. Left ventricular diastolic parameters are consistent with Grade II diastolic dysfunction (pseudonormalization). Elevated left ventricular end-diastolic pressure.  2. Right ventricular systolic function is normal. The right ventricular size is normal. There is mildly elevated pulmonary artery systolic pressure.  3. Left atrial size was moderately dilated.  4. The mitral valve is normal in structure. Trivial mitral valve regurgitation. No  evidence of mitral stenosis.  5. The aortic valve was not well visualized. Aortic valve regurgitation is not visualized. No aortic stenosis is present.  6. The inferior vena cava is dilated in size with <50% respiratory variability, suggesting right atrial pressure of 15 mmHg. FINDINGS  Left Ventricle: Left ventricular ejection fraction, by estimation, is 60 to 65%. The left ventricle has normal function. The left ventricle has no regional wall motion abnormalities. Definity contrast agent was given IV to delineate the left ventricular  endocardial borders. The left ventricular internal cavity size was normal in size. There is mild concentric left ventricular hypertrophy. Left ventricular diastolic parameters are consistent with Grade II diastolic dysfunction (pseudonormalization). Elevated left ventricular end-diastolic pressure. Right Ventricle: The right ventricular size is normal. No increase in right ventricular wall thickness. Right ventricular systolic function is normal. There is mildly elevated pulmonary artery systolic pressure. The tricuspid regurgitant velocity is 2.47  m/s, and with an assumed right atrial pressure of 15 mmHg, the estimated right ventricular systolic pressure is [id number] mmHg. Left Atrium: Left atrial size was moderately dilated. Right Atrium: Right atrial size was normal in size. Pericardium: Trivial pericardial effusion is present. Mitral Valve: The mitral valve is normal in structure. Normal mobility of the mitral valve leaflets. Trivial mitral valve  regurgitation. No evidence of mitral valve stenosis. Tricuspid Valve: The tricuspid valve is normal in structure. Tricuspid valve regurgitation is mild . No evidence of tricuspid stenosis. Aortic Valve: The aortic valve was not well visualized. Aortic valve regurgitation is not visualized. No aortic stenosis is present. Pulmonic Valve: The pulmonic valve was normal in structure. Pulmonic valve regurgitation is trivial. No evidence of  pulmonic stenosis. Aorta: The aortic root is normal in size and structure. Venous: The inferior vena cava is dilated in size with less than 50% respiratory variability, suggesting right atrial pressure of 15 mmHg. IAS/Shunts: No atrial level shunt detected by color flow Doppler.  LEFT VENTRICLE PLAX 2D LVIDd:         4.96 cm  Diastology LVIDs:         3.57 cm  LV e' lateral:   5.77 cm/s LV PW:         1.30 cm  LV E/e' lateral: 23.7 LV IVS:        1.09 cm  LV e' medial:    6.09 cm/s LVOT diam:     2.00 cm  LV E/e' medial:  22.5 LV SV:         92 LV SV Index:   41 LVOT Area:     3.14 cm  RIGHT VENTRICLE             IVC RV Basal diam:  2.59 cm     IVC diam: 2.72 cm RV S prime:     12.20 cm/s TAPSE (M-mode): 2.3 cm LEFT ATRIUM             Index       RIGHT ATRIUM           Index LA diam:        4.80 cm 2.16 cm/m  RA Area:     18.20 cm LA Vol (A2C):   77.2 ml 34.76 ml/m RA Volume:   44.70 ml  20.12 ml/m LA Vol (A4C):   69.6 ml 31.33 ml/m LA Biplane Vol: 73.9 ml 33.27 ml/m  AORTIC VALVE LVOT Vmax:   125.00 cm/s LVOT Vmean:  82.700 cm/s LVOT VTI:    0.293 m  AORTA Ao Root diam: 2.90 cm Ao Asc diam:  3.30 cm MITRAL VALVE                TRICUSPID VALVE MV Area (PHT): 3.72 cm     TR Peak grad:   24.4 mmHg MV Decel Time: 204 msec     TR Vmax:        247.00 cm/s MV E velocity: 137.00 cm/s MV A velocity: 133.00 cm/s  SHUNTS MV E/A ratio:  1.03         Systemic VTI:  0.29 m                             Systemic Diam: 2.00 cm Skeet Latch MD Electronically signed by Skeet Latch MD Signature Date/Time: 02/21/2020/1:32:25 PM    Final         Scheduled Meds: . Chlorhexidine Gluconate Cloth  6 each Topical Daily  . ferrous sulfate  325 mg Oral BID WC  . furosemide  40 mg Intravenous BID  . heparin  5,000 Units Subcutaneous Q8H  . levothyroxine  150 mcg Oral Q0600  . NIFEdipine  30 mg Oral Daily  . olopatadine  1 drop Both Eyes BID  . rosuvastatin  10 mg Oral  Daily   Continuous Infusions:   LOS: 3 days      Georgette Shell, MD [facility]   To contact the attending provider between 7A-7P or the covering provider during after hours 7P-7A, please log into the web site [email or web address] and access using universal [place] password for that web site. If you do not have the password, please call the hospital operator.  02/23/2020, 9:57 AM

## 2020-02-24 ENCOUNTER — Inpatient Hospital Stay (HOSPITAL_COMMUNITY): Payer: BC Managed Care – PPO

## 2020-02-24 LAB — RENAL FUNCTION PANEL
Albumin: 3.3 g/dL — ABNORMAL LOW (ref 3.5–5.0)
Anion gap: 8 (ref 5–15)
BUN: 33 mg/dL — ABNORMAL HIGH (ref 8–23)
CO2: 40 mmol/L — ABNORMAL HIGH (ref 22–32)
Calcium: 9.4 mg/dL (ref 8.9–10.3)
Chloride: 93 mmol/L — ABNORMAL LOW (ref 98–111)
Creatinine, Ser: 1.21 mg/dL — ABNORMAL HIGH (ref 0.44–1.00)
GFR calc Af Amer: 55 mL/min — ABNORMAL LOW (ref >60)
GFR calc non Af Amer: 47 mL/min — ABNORMAL LOW (ref >60)
Glucose, Bld: 102 mg/dL — ABNORMAL HIGH (ref 70–99)
Phosphorus: 4.6 mg/dL (ref 2.5–4.6)
Potassium: 3.8 mmol/L (ref 3.5–5.1)
Sodium: 141 mmol/L (ref 135–145)

## 2020-02-24 LAB — MAGNESIUM: Magnesium: 1.8 mg/dL (ref 1.7–2.4)

## 2020-02-24 NOTE — Progress Notes (Addendum)
PROGRESS NOTE    Bridget Lamb  [id number] DOB: 05-Sep-1956 DOA: 02/20/2020 PCP: [name], PA-C   Chief Complaint  Patient presents with  . Shortness of Breath    Brief Narrative:  64 year old female with history of diastolic CHF, obesity, OSA not on CPAP, HTN, hypothyroidism, asthma, IDAandendometrial cancer s/pTRH/BSO who [facility] and [facility].She presented to PCP with chest tightness and found to have saturation in the 50s on room air and sent to ED.  In ED, placed on 15 L NRB>BiPAP. ABG 7.1 5/94/314/36 on 100% FiO2>>7.2 2/74/66/33 on 40%. BNP 562 from 164 in 2019. WBC 13.4. Influenza and COVID-19 PCR negative. CXR with left base opacity. EKG sinus rhythm at 95/min.No acute ischemic finding.CTA chest negative for PE but revealed pulmonary edema and atelectasis.  Patient was admitted to ICU for acute respiratory failure due to acute pulmonary edema in the setting of uncontrolled hypertension and diastolic CHF exacerbation.She was diuresed with IV Lasix with improvement in her breathing.She was transferred to [facility] service on 4/30.  Assessment & Plan:   Active Problems:   Acute respiratory failure with hypoxia and hypercapnia (HCC)   Acute pulmonary edema (HCC)   Acute respiratory failure with hypoxia and hypercarbia-multifactorial including pulmonary edema,diastolic CHF, OSA and possible OHS.Patient with diagnosis of OSA but not on CPAP.  Initial ABG on admission with acute respiratory acidosis with hypercarbia.  Patient refuses CPAP Titrate oxygen down to keep her saturation above 88%. Increase activity out of bed PT evaluation. Oxygen saturation on room air at rest is 89% and while ambulating is 78%.  She was placed on 3 L of oxygen sats came up to 92%. She  will need home O2 upon discharge based on the above parameters.  Acute on chronic diastolic CHF/pulmonary edema/bilateral pleural effusion: BNP elevated.  CTA chest reveals pulmonary edema. Echo with EF of 60 to 65%, G2-DD andmoderate LAE. Negative by 6. 3 L. Renal function improved. Continue IV Lasix at 40 mg twice daily -Monitor fluid status, renal function and electrolytes -Sodium and fluid restrictions  -CXR 5/2-Improved aeration in the bilateral lung bases. Persistent diffuse bilateral interstitial opacities with small bilateral pleural effusions.  Chest x-ray 02/20/2020 stable mild central pulmonary vascular congestion.  Increased perihilar and basilar opacities concerning for edema or infiltrates with mild bilateral pleural effusions.  Uncontrolled hypertension:Blood pressure 136/72 on nifedipine and Lasix.  Lisinopril on hold to allow room for diuretics..    OSA not on CPAP-patient refused CPAP overnight. - CKD-3B/azotemia: Creatinine 1.17 down from 1.60.  Baseline creatinine between 1.3-1.5.  Continue to monitor on Lasix.    Hyperkalemia: Resolved.  Iron deficiency anemia: Baseline Hgb 8-9>10.3 (admit)>>9.0.Denies melena or hematochezia. Iron sat 99%. TIBC 288. Ferritin 80.HB 8.8 yesterday.  -IV Feraheme on 4/30.Will give additional dose in 3 to 4 days if still inpatient. -Start ferrous sulfate with bowel regimen -Monitor  Hypothyroidism: TSH within normal. -Continue home Synthroid  Morbid obesity:Body mass index is 46.28 kg/m. -Encourage lifestyle change to lose weight.  History of endometrial cancers/pTRH/BSO -Outpatient follow-up with Gynas needed.   DVT prophylaxis:Subcu heparin Code Status:Full code Family Communication: Patient Status is: Inpatient  Remains inpatient appropriate because:IV treatments appropriate due to intensity of illness  Inpatient level of care appropriate due to severity of illness andhigh oxygen requirement and need for IV diuretics   Dispo: The patient is from:Home Anticipated d/c is [id number] Anticipated d/c date is: 3  days Patient currentlyis not medically stable to d/c.    Procedures:  None   Consultants:  PCCM  Antimicrobials: Anti-infectives (From admission, onward)   Start     Dose/Rate Route Frequency Ordered Stop   02/21/20 0945  azithromycin (ZITHROMAX) 500 mg in sodium chloride 0.9 % 250 mL IVPB     500 mg 250 mL/hr over 60 Minutes Intravenous  Once 02/21/20 0853 02/21/20 1353   02/20/20 1800  cefTRIAXone (ROCEPHIN) 2 g in sodium chloride 0.9 % 100 mL IVPB     2 g 200 mL/hr over 30 Minutes Intravenous  Once 02/20/20 1734 02/21/20 0020   02/20/20 1800  azithromycin (ZITHROMAX) 500 mg in sodium chloride 0.9 % 250 mL IVPB  Status:  Discontinued     500 mg 250 mL/hr over 60 Minutes Intravenous  Once 02/20/20 1734 02/21/20 0853       Subjective: Patient is sitting up by the side of the bed denies any new complaints denies any chest pain, denies any melena or or hematemesis or hematochezia.  Objective: Vitals:   02/24/20 0433 02/24/20 0500 02/24/20 0735 02/24/20 0800  BP: (!) 111/44  136/72   Pulse: 76  70 73  Resp: (!) 22  18   Temp: 97.8 F (36.6 C)  98.6 F (37 C)   TempSrc: Oral  Oral   SpO2: 96%  97% 98%  Weight:  120.1 kg    Height:        Intake/Output Summary (Last 24 hours) at 02/24/2020 1001 Last data filed at 02/24/2020 0839 Gross per 24 hour  Intake 825 ml  Output 3300 ml  Net -2475 ml   Filed Weights   02/21/20 0017 02/21/20 0343 02/24/20 0500  Weight: 122.3 kg 122.3 kg 120.1 kg    Examination:  General exam: Appears calm and comfortable  Respiratory system: Few scattered rhonchi to auscultation. Respiratory effort normal. Cardiovascular system: S1 & S2 heard, RRR. No JVD, murmurs, rubs, gallops or clicks. No pedal edema. Gastrointestinal system: Abdomen is nondistended, soft and nontender. No organomegaly or masses felt. Normal bowel sounds heard. Central nervous system: Alert and oriented. No focal neurological deficits. Extremities: 1+  pitting edema bilaterally Skin: No rashes, lesions or ulcers Psychiatry: Judgement and insight appear normal. Mood & affect appropriate.     Data Reviewed: I have personally reviewed following labs and imaging studies  CBC: Recent Labs  Lab 02/20/20 1253 02/20/20 1253 02/20/20 1312 02/20/20 1823 02/21/20 0419 02/22/20 0346 02/23/20 0227  WBC 13.4*  --   --   --  10.0 9.5 7.4  NEUTROABS 11.0*  --   --   --   --   --   --   HGB 10.3*   < > 11.6* 11.2* 9.9* 9.0* 8.8*  HCT 36.2   < > 34.0* 33.0* 34.1* 30.2* 29.9*  MCV 100.6*  --   --   --  100.3* 96.5 97.7  PLT 220  --   --   --  178 164 173   < > = values in this interval not displayed.    Basic Metabolic Panel: Recent Labs  Lab 02/21/20 0419 02/21/20 0419 02/21/20 1220 02/21/20 1703 02/22/20 0346 02/23/20 0227 02/24/20 0409  NA 143   < > 141 144 140 141 141  K 5.9*   < > 4.9 4.7 4.3 3.7 3.8  CL 103   < > 101 101 98 96* 93*  CO2 28   < > 33* 33* 32 35* 40*  GLUCOSE 117*   < > 123* 127* 105* 100* 102*  BUN 30*   < >  35* 39* 39* 37* 33*  CREATININE 1.34*   < > 1.39* 1.60* 1.30* 1.17* 1.21*  CALCIUM 9.3   < > 9.3 9.6 9.2 9.4 9.4  MG 2.2  --   --   --   --  1.8 1.8  PHOS 5.5*  --   --   --   --  4.0 4.6   < > = values in this interval not displayed.    GFR: Estimated Creatinine Clearance: 60 mL/min (A) (by C-G formula based on SCr of 1.21 mg/dL (H)).  Liver Function Tests: Recent Labs  Lab 02/20/20 1253 02/23/20 0227 02/24/20 0409  AST 20  --   --   ALT 16  --   --   ALKPHOS 83  --   --   BILITOT 0.8  --   --   PROT 7.5  --   --   ALBUMIN 3.9 3.1* 3.3*    CBG: No results for input(s): GLUCAP in the last 168 hours.   Recent Results (from the past 240 hour(s))  Respiratory Panel by RT PCR (Flu A&B, Covid) - Nasopharyngeal Swab     Status: None   Collection Time: 02/20/20 12:47 PM   Specimen: Nasopharyngeal Swab  Result Value Ref Range Status   SARS Coronavirus 2 by RT PCR NEGATIVE NEGATIVE Final     Comment: (NOTE) SARS-CoV-2 target nucleic acids are NOT DETECTED. The SARS-CoV-2 RNA is generally detectable in upper respiratoy specimens during the acute phase of infection. The lowest concentration of SARS-CoV-2 viral copies this assay can detect is 131 copies/mL. A negative result does not preclude SARS-Cov-2 infection and should not be used as the sole basis for treatment or other patient management decisions. A negative result may occur with  improper specimen collection/handling, submission of specimen other than nasopharyngeal swab, presence of viral mutation(s) within the areas targeted by this assay, and inadequate number of viral copies (<131 copies/mL). A negative result must be combined with clinical observations, patient history, and epidemiological information. The expected result is Negative. Fact Sheet for Patients:  PinkCheek.be Fact Sheet for Healthcare Providers:  GravelBags.it This test is not yet ap proved or cleared by the [place] FDA and  has been authorized for detection and/or diagnosis of SARS-CoV-2 by FDA under an Emergency Use Authorization (EUA). This EUA will remain  in effect (meaning this test can be used) for the duration of the COVID-19 declaration under Section 564(b)(1) of the Act, 21 U.S.C. section 360bbb-3(b)(1), unless the authorization is terminated or revoked sooner.    Influenza A by PCR NEGATIVE NEGATIVE Final   Influenza B by PCR NEGATIVE NEGATIVE Final    Comment: (NOTE) The Xpert Xpress SARS-CoV-2/FLU/RSV assay is intended as an aid in  the diagnosis of influenza from Nasopharyngeal swab specimens and  should not be used as a sole basis for treatment. Nasal washings and  aspirates are unacceptable for Xpert Xpress SARS-CoV-2/FLU/RSV  testing. Fact Sheet for Patients: PinkCheek.be Fact Sheet for Healthcare  Providers: GravelBags.it This test is not yet approved or cleared by the [place] FDA and  has been authorized for detection and/or diagnosis of SARS-CoV-2 by  FDA under an Emergency Use Authorization (EUA). This EUA will remain  in effect (meaning this test can be used) for the duration of the  Covid-19 declaration under Section 564(b)(1) of the Act, 21  U.S.C. section 360bbb-3(b)(1), unless the authorization is  terminated or revoked. Performed at [facility], [place] [address], [place], [place]  [id number]   MRSA PCR Screening     Status: None   Collection Time: 02/20/20 11:59 PM   Specimen: Nasopharyngeal  Result Value Ref Range Status   MRSA by PCR NEGATIVE NEGATIVE Final    Comment:        The GeneXpert MRSA Assay (FDA approved for NASAL specimens only), is one component of a comprehensive MRSA colonization surveillance program. It is not intended to diagnose MRSA infection nor to guide or monitor treatment for MRSA infections. Performed at [facility], [place] [address], [place], [place] [address]          Radiology Studies: No results found.      Scheduled Meds: . Chlorhexidine Gluconate Cloth  6 each Topical Daily  . ferrous sulfate  325 mg Oral BID WC  . furosemide  40 mg Intravenous BID  . heparin  5,000 Units Subcutaneous Q8H  . levothyroxine  150 mcg Oral Q0600  . NIFEdipine  30 mg Oral Daily  . olopatadine  1 drop Both Eyes BID  . rosuvastatin  10 mg Oral Daily   Continuous Infusions:   LOS: 4 days     Georgette Shell, MD [facility]   To contact the attending provider between 7A-7P or the covering provider during after hours 7P-7A, please log into the web site [email or web address] and access using universal [place] password for that web site. If you do not have the password, please call the hospital operator.  02/24/2020, 10:01 AM

## 2020-02-25 LAB — RENAL FUNCTION PANEL
Albumin: 3.2 g/dL — ABNORMAL LOW (ref 3.5–5.0)
Anion gap: 10 (ref 5–15)
BUN: 31 mg/dL — ABNORMAL HIGH (ref 8–23)
CO2: 42 mmol/L — ABNORMAL HIGH (ref 22–32)
Calcium: 9.2 mg/dL (ref 8.9–10.3)
Chloride: 90 mmol/L — ABNORMAL LOW (ref 98–111)
Creatinine, Ser: 1.28 mg/dL — ABNORMAL HIGH (ref 0.44–1.00)
GFR calc Af Amer: 51 mL/min — ABNORMAL LOW (ref >60)
GFR calc non Af Amer: 44 mL/min — ABNORMAL LOW (ref >60)
Glucose, Bld: 100 mg/dL — ABNORMAL HIGH (ref 70–99)
Phosphorus: 3.6 mg/dL (ref 2.5–4.6)
Potassium: 3.8 mmol/L (ref 3.5–5.1)
Sodium: 142 mmol/L (ref 135–145)

## 2020-02-25 LAB — CBC
HCT: 30.4 % — ABNORMAL LOW (ref 36.0–46.0)
Hemoglobin: 8.9 g/dL — ABNORMAL LOW (ref 12.0–15.0)
MCH: 28.6 pg (ref 26.0–34.0)
MCHC: 29.3 g/dL — ABNORMAL LOW (ref 30.0–36.0)
MCV: 97.7 fL (ref 80.0–100.0)
Platelets: 175 10*3/uL (ref 150–400)
RBC: 3.11 MIL/uL — ABNORMAL LOW (ref 3.87–5.11)
RDW: 15 % (ref 11.5–15.5)
WBC: 7.2 10*3/uL (ref 4.0–10.5)
nRBC: 0 % (ref 0.0–0.2)

## 2020-02-25 LAB — MAGNESIUM: Magnesium: 1.8 mg/dL (ref 1.7–2.4)

## 2020-02-25 LAB — GLUCOSE, CAPILLARY: Glucose-Capillary: 106 mg/dL — ABNORMAL HIGH (ref 70–99)

## 2020-02-25 MED ORDER — ACETAZOLAMIDE 250 MG PO TABS
250.0000 mg | ORAL_TABLET | Freq: Two times a day (BID) | ORAL | Status: DC
  Administered 2020-02-25 – 2020-02-26 (×3): 250 mg via ORAL
  Filled 2020-02-25 (×5): qty 1

## 2020-02-25 MED ORDER — TORSEMIDE 20 MG PO TABS
20.0000 mg | ORAL_TABLET | Freq: Two times a day (BID) | ORAL | Status: DC
  Administered 2020-02-25 – 2020-02-26 (×2): 20 mg via ORAL
  Filled 2020-02-25 (×2): qty 1

## 2020-02-25 MED ORDER — SODIUM CHLORIDE 0.9 % IV SOLN
510.0000 mg | Freq: Once | INTRAVENOUS | Status: AC
  Administered 2020-02-25: 510 mg via INTRAVENOUS
  Filled 2020-02-25: qty 17

## 2020-02-25 NOTE — Progress Notes (Signed)
PROGRESS NOTE  Bridget Lamb [id number] DOB: [birth year]   PCP: [name], PA-C  Patient is from: Home.  DOA: 02/20/2020 LOS: 5  Brief Narrative / Interim history: 64 year old female with history of diastolic CHF, obesity, OSA not on CPAP, HTN, hypothyroidism, asthma, IDA and endometrial cancer s/p TRH/BSO who presented to [facility] on 4/28 with dyspnea and hypoxia.  She presented to PCP with chest tightness and found to have saturation in the 50s on room air and sent to ED.  In ED, placed on 15 L NRB> BiPAP.  ABG 7.1 5/94/314/36 on 100% FiO2>> 7.2 2/74/66/33 on 40%.  BNP 562 from 164 in 2019.  WBC 13.4.  Influenza and COVID-19 PCR negative.  CXR with left base opacity.  EKG sinus rhythm at 95/min.  No acute ischemic finding.  CTA chest negative for PE but revealed pulmonary edema and atelectasis.  Patient was admitted to ICU for acute respiratory failure due to acute pulmonary edema in the setting of uncontrolled hypertension and diastolic CHF exacerbation.  She was diuresed with IV Lasix with improvement in her breathing.  She was transferred to [facility] service on 4/30.  She was continued on IV Lasix and eventually transition to torsemide.  Has not been able to tolerate CPAP mask.    Subjective: Seen and examined earlier this morning.  No major events overnight of this morning.  No complaints.  She has not been able to tolerate CPAP mask.  She denies chest pain, dyspnea, GI or UTI symptoms.  Had 4 L urine output/24 hours.  Net -13 L so far.  Objective: Vitals:   02/24/20 2121 02/24/20 2344 02/25/20 0500 02/25/20 0733  BP: (!) 116/45 (!) 123/54 (!) 122/35 (!) 130/51  Pulse: 76 72 79 72  Resp: 18 19 18 16   Temp:  98.6 F (37 C) 98.4 F (36.9 C) 98 F (36.7 C)  TempSrc:  Oral Oral Oral  SpO2: 94% 95% 97% 95%  Weight:      Height:        Intake/Output Summary (Last 24 hours) at 02/25/2020 1105 Last data filed at 02/25/2020 0825 Gross per 24 hour  Intake --  Output 3800 ml   Net -3800 ml   Filed Weights   02/21/20 0017 02/21/20 0343 02/24/20 0500  Weight: 122.3 kg 122.3 kg 120.1 kg    Examination:  GENERAL: No apparent distress.  Nontoxic. HEENT: MMM.  Vision and hearing grossly intact.  NECK: Supple.  No apparent JVD.  RESP: 94% on 1 L.  No IWOB.  Diminished aeration bilaterally. CVS:  RRR. Heart sounds normal.  ABD/GI/GU: Bowel sounds present. Soft. Non tender.  MSK/EXT:  Moves extremities. No apparent deformity.  Trace edema bilaterally. SKIN: no apparent skin lesion or wound NEURO: Awake, alert and oriented appropriately.  No apparent focal neuro deficit. PSYCH: Calm. Normal affect.  Procedures:  None  Microbiology summarized: COVID-19 PCR negative. Influenza PCR negative.  Assessment & Plan: Acute respiratory failure with hypoxia and hypercarbia-multifactorial including pulmonary edema, diastolic CHF, OSA and possible OHS.  Patient with diagnosis of OSA but not on CPAP.  Initial ABG on admission with acute respiratory acidosis with hypercarbia.  Ambulated and desaturated to 78% on room air. -Continue weaning oxygen.  Minimum oxygen to keep saturation above 88%. -OOB/PT/OT -Treat treatable causes as below.  Acute on chronic diastolic CHF/pulmonary edema/bilateral pleural effusion: BNP elevated.  CTA chest reveals pulmonary edema.  Echo with EF of 60 to 65%, G2-DD and moderate LAE.  CXR on 5/2  with improved aeration.  Responded to IV Lasix.  Had 4 L UOP/24 hours.  Net -3 L so far.  Renal function stable.  Mild alkalosis. -Switch to p.o. torsemide 20 mg twice daily starting this afternoon. -On Diamox 250 mg twice daily for alkalosis. -Monitor fluid status, renal function and electrolytes -Sodium and fluid restrictions  Uncontrolled hypertension: Normotensive.  On lisinopril and nifedipine at home.  Nifedipine might not be ideal in the setting of CHF -Continue nifedipine for now -Hold lisinopril to allow room for diuretics -Continue IV Lasix  as above.  OSA not on CPAP-could not tolerate CPAP mask. -May need oxygen at night  CKD-3B/azotemia: Baseline Cr 1.3-1.5> 1.24 (admit)> 1.60> 1.3> 1.28 -Continue monitoring   Metabolic alkalosis: Likely due to diuretics.  Could be compensatory to CO2 retention -Diamox to 50 mg twice daily as above.  Hyperkalemia: Resolved.  Iron deficiency anemia: Baseline Hgb 8-9> 10.3 (admit)>> 9.0> 8.9.  Denies melena or hematochezia.  Iron sat 9 9%.  TIBC 288.  Ferritin 80. -IV Feraheme on 4/30 and 5/3. -Continue ferrous sulfate with bowel regimen -Monitor  Hypothyroidism: TSH within normal. -Continue home Synthroid  Morbid obesity:Body mass index is 45.45 kg/m. -Encourage lifestyle change to lose weight.  History of endometrial cancer s/p TRH/BSO -Outpatient follow-up with Gyn as needed.                  DVT prophylaxis: Subcu heparin Code Status: Full code Family Communication: Patient and/or RN. Available if any question.    Status is: Inpatient  Remains inpatient appropriate because:Inpatient level of care appropriate due to severity of illness   Dispo: The patient is from: Home              Anticipated d/c is to: Home              Anticipated d/c date is: 1 day              Patient currently is not medically stable to d/c.  Could be discharged home on 5/4 if stable on p.o. torsemide.          Consultants:  PCCM   Sch Meds:  Scheduled Meds: . acetaZOLAMIDE  250 mg Oral BID  . Chlorhexidine Gluconate Cloth  6 each Topical Daily  . ferrous sulfate  325 mg Oral BID WC  . heparin  5,000 Units Subcutaneous Q8H  . levothyroxine  150 mcg Oral Q0600  . NIFEdipine  30 mg Oral Daily  . olopatadine  1 drop Both Eyes BID  . rosuvastatin  10 mg Oral Daily  . torsemide  20 mg Oral BID   Continuous Infusions:  PRN Meds:.acetaminophen, albuterol, hydrALAZINE, polyethylene glycol, senna-docusate  Antimicrobials: Anti-infectives (From admission, onward)    Start     Dose/Rate Route Frequency Ordered Stop   02/21/20 0945  azithromycin (ZITHROMAX) 500 mg in sodium chloride 0.9 % 250 mL IVPB     500 mg 250 mL/hr over 60 Minutes Intravenous  Once 02/21/20 0853 02/21/20 1353   02/20/20 1800  cefTRIAXone (ROCEPHIN) 2 g in sodium chloride 0.9 % 100 mL IVPB     2 g 200 mL/hr over 30 Minutes Intravenous  Once 02/20/20 1734 02/21/20 0020   02/20/20 1800  azithromycin (ZITHROMAX) 500 mg in sodium chloride 0.9 % 250 mL IVPB  Status:  Discontinued     500 mg 250 mL/hr over 60 Minutes Intravenous  Once 02/20/20 1734 02/21/20 0853       I have personally reviewed  the following labs and images: CBC: Recent Labs  Lab 02/20/20 1253 02/20/20 1312 02/20/20 1823 02/21/20 0419 02/22/20 0346 02/23/20 0227 02/25/20 0338  WBC 13.4*  --   --  10.0 9.5 7.4 7.2  NEUTROABS 11.0*  --   --   --   --   --   --   HGB 10.3*   < > 11.2* 9.9* 9.0* 8.8* 8.9*  HCT 36.2   < > 33.0* 34.1* 30.2* 29.9* 30.4*  MCV 100.6*  --   --  100.3* 96.5 97.7 97.7  PLT 220  --   --  178 164 173 175   < > = values in this interval not displayed.   BMP &GFR Recent Labs  Lab 02/21/20 0419 02/21/20 1220 02/21/20 1703 02/22/20 0346 02/23/20 0227 02/24/20 0409 02/25/20 0338  NA 143   < > 144 140 141 141 142  K 5.9*   < > 4.7 4.3 3.7 3.8 3.8  CL 103   < > 101 98 96* 93* 90*  CO2 28   < > 33* 32 35* 40* 42*  GLUCOSE 117*   < > 127* 105* 100* 102* 100*  BUN 30*   < > 39* 39* 37* 33* 31*  CREATININE 1.34*   < > 1.60* 1.30* 1.17* 1.21* 1.28*  CALCIUM 9.3   < > 9.6 9.2 9.4 9.4 9.2  MG 2.2  --   --   --  1.8 1.8 1.8  PHOS 5.5*  --   --   --  4.0 4.6 3.6   < > = values in this interval not displayed.   Estimated Creatinine Clearance: 56.7 mL/min (A) (by C-G formula based on SCr of 1.28 mg/dL (H)). Liver & Pancreas: Recent Labs  Lab 02/20/20 1253 02/23/20 0227 02/24/20 0409 02/25/20 0338  AST 20  --   --   --   ALT 16  --   --   --   ALKPHOS 83  --   --   --   BILITOT 0.8   --   --   --   PROT 7.5  --   --   --   ALBUMIN 3.9 3.1* 3.3* 3.2*   No results for input(s): LIPASE, AMYLASE in the last 168 hours. No results for input(s): AMMONIA in the last 168 hours. Diabetic: No results for input(s): HGBA1C in the last 72 hours. No results for input(s): GLUCAP in the last 168 hours. Cardiac Enzymes: No results for input(s): CKTOTAL, CKMB, CKMBINDEX, TROPONINI in the last 168 hours. No results for input(s): PROBNP in the last 8760 hours. Coagulation Profile: No results for input(s): INR, PROTIME in the last 168 hours. Thyroid Function Tests: No results for input(s): TSH, T4TOTAL, FREET4, T3FREE, THYROIDAB in the last 72 hours. Lipid Profile: No results for input(s): CHOL, HDL, LDLCALC, TRIG, CHOLHDL, LDLDIRECT in the last 72 hours. Anemia Panel: No results for input(s): VITAMINB12, FOLATE, FERRITIN, TIBC, IRON, RETICCTPCT in the last 72 hours. Urine analysis:    Component Value Date/Time   COLORURINE YELLOW 10/22/2019 1153   APPEARANCEUR CLEAR 10/22/2019 1153   LABSPEC 1.010 10/22/2019 1153   PHURINE 6.0 10/22/2019 1153   GLUCOSEU NEGATIVE 10/22/2019 1153   HGBUR MODERATE (A) 10/22/2019 1153   BILIRUBINUR NEGATIVE 10/22/2019 1153   KETONESUR NEGATIVE 10/22/2019 1153   PROTEINUR NEGATIVE 10/22/2019 1153   UROBILINOGEN 1.0 10/06/2013 1752   NITRITE NEGATIVE 10/22/2019 1153   LEUKOCYTESUR NEGATIVE 10/22/2019 1153   Sepsis Labs: Invalid input(s): PROCALCITONIN, LACTICIDVEN  Microbiology: Recent Results (from the past 240 hour(s))  Respiratory Panel by RT PCR (Flu A&B, Covid) - Nasopharyngeal Swab     Status: None   Collection Time: 02/20/20 12:47 PM   Specimen: Nasopharyngeal Swab  Result Value Ref Range Status   SARS Coronavirus 2 by RT PCR NEGATIVE NEGATIVE Final    Comment: (NOTE) SARS-CoV-2 target nucleic acids are NOT DETECTED. The SARS-CoV-2 RNA is generally detectable in upper respiratoy specimens during the acute phase of infection. The  lowest concentration of SARS-CoV-2 viral copies this assay can detect is 131 copies/mL. A negative result does not preclude SARS-Cov-2 infection and should not be used as the sole basis for treatment or other patient management decisions. A negative result may occur with  improper specimen collection/handling, submission of specimen other than nasopharyngeal swab, presence of viral mutation(s) within the areas targeted by this assay, and inadequate number of viral copies (<131 copies/mL). A negative result must be combined with clinical observations, patient history, and epidemiological information. The expected result is Negative. Fact Sheet for Patients:  PinkCheek.be Fact Sheet for Healthcare Providers:  GravelBags.it This test is not yet ap proved or cleared by the [place] FDA and  has been authorized for detection and/or diagnosis of SARS-CoV-2 by FDA under an Emergency Use Authorization (EUA). This EUA will remain  in effect (meaning this test can be used) for the duration of the COVID-19 declaration under Section 564(b)(1) of the Act, 21 U.S.C. section 360bbb-3(b)(1), unless the authorization is terminated or revoked sooner.    Influenza A by PCR NEGATIVE NEGATIVE Final   Influenza B by PCR NEGATIVE NEGATIVE Final    Comment: (NOTE) The Xpert Xpress SARS-CoV-2/FLU/RSV assay is intended as an aid in  the diagnosis of influenza from Nasopharyngeal swab specimens and  should not be used as a sole basis for treatment. Nasal washings and  aspirates are unacceptable for Xpert Xpress SARS-CoV-2/FLU/RSV  testing. Fact Sheet for Patients: PinkCheek.be Fact Sheet for Healthcare Providers: GravelBags.it This test is not yet approved or cleared by the [place] FDA and  has been authorized for detection and/or diagnosis of SARS-CoV-2 by  FDA under an Emergency  Use Authorization (EUA). This EUA will remain  in effect (meaning this test can be used) for the duration of the  Covid-19 declaration under Section 564(b)(1) of the Act, 21  U.S.C. section 360bbb-3(b)(1), unless the authorization is  terminated or revoked. Performed at [facility], [place] [address] [place], [place] [address]   MRSA PCR Screening     Status: None   Collection Time: 02/20/20 11:59 PM   Specimen: Nasopharyngeal  Result Value Ref Range Status   MRSA by PCR NEGATIVE NEGATIVE Final    Comment:        The GeneXpert MRSA Assay (FDA approved for NASAL specimens only), is one component of a comprehensive MRSA colonization surveillance program. It is not intended to diagnose MRSA infection nor to guide or monitor treatment for MRSA infections. Performed at [facility], [place] [address], [place], [place] [address]     Radiology Studies: No results found.    Thomasenia Dowse T. [place]  If 7PM-7AM, please contact night-coverage [email or web address] Password [email or web address] 02/25/2020, 11:05 AM

## 2020-02-25 NOTE — Progress Notes (Signed)
Pt completed sponge bathing, dressing and grooming seated at sink to conserve energy without assist. Educated in energy conservation and reinforced with written handout. Pt is not aware in importance of daily weights, educated.    02/25/20 1100  OT Visit Information  Last OT Received On 02/25/20  Assistance Needed +1  History of Present Illness Bridget Lamb is a 64 y.o. female who was admitted with acute hypoxic and hypercapnic respiratory failure felt to be 2/2 acute pulmonary edema. PMH includes HTN, obesity, endometrial cancer, CHF  Precautions  Precaution Comments Watch O2  Pain Assessment  Pain Assessment No/denies pain  Cognition  Arousal/Alertness Awake/alert  Behavior During Therapy WFL for tasks assessed/performed  Overall Cognitive Status Within Functional Limits for tasks assessed  Upper Extremity Assessment  Upper Extremity Assessment Overall WFL for tasks assessed  Lower Extremity Assessment  Lower Extremity Assessment Defer to PT evaluation  ADL  Overall ADL's  Modified independent  General ADL Comments Pt completed sponge bathing and dressing on BSC in front of sink and stood for grooming. Pt has been routinely using BSC without assist. Recommended pt consider seated showering. Educated in importance of weighing herself in the morning daily. Pt reports she does not have a working scale. Pt can rely on her grandsons for IADL.  Bed Mobility  General bed mobility comments pt in chair  Balance  Overall balance assessment No apparent balance deficits (not formally assessed)  Transfers  Overall transfer level Independent  Equipment used None  OT - End of Session  Equipment Utilized During Treatment Oxygen  Activity Tolerance Patient tolerated treatment well  Patient left in chair;with call bell/phone within reach  OT Assessment/Plan  OT Plan Discharge plan remains appropriate  OT Visit Diagnosis Other (comment) (decreased activity tolerance)  OT Frequency (ACUTE  ONLY) Min 2X/week  Follow Up Recommendations No OT follow up  OT Equipment None recommended by OT  AM-PAC OT "6 Clicks" Daily Activity Outcome Measure (Version 2)  Help from another person eating meals? 4  Help from another person taking care of personal grooming? 4  Help from another person toileting, which includes using toliet, bedpan, or urinal? 4  Help from another person bathing (including washing, rinsing, drying)? 4  Help from another person to put on and taking off regular upper body clothing? 4  Help from another person to put on and taking off regular lower body clothing? 4  6 Click Score 24  OT Goal Progression  Progress towards OT goals Progressing toward goals  Acute Rehab OT Goals  Patient Stated Goal improve breathing and endurance  OT Goal Formulation With patient  Time For Goal Achievement 03/08/20  Potential to Achieve Goals Good  OT Time Calculation  OT Start Time (ACUTE ONLY) 1100  OT Stop Time (ACUTE ONLY) 1117  OT Time Calculation (min) 17 min  OT General Charges  $OT Visit 1 Visit  OT Treatments  $Self Care/Home Management  8-22 mins  Nestor Lewandowsky, OTR/L Acute Rehabilitation Services Pager: [phone] Office: [phone]

## 2020-02-25 NOTE — Progress Notes (Signed)
Physical Therapy Treatment Patient Details Name: Bridget Lamb MRN: [id number] DOB: [birth year] Today's Date: 02/25/2020    History of Present Illness Bridget Lamb is a 64 y.o. female who was admitted with acute hypoxic and hypercapnic respiratory failure felt to be 2/2 acute pulmonary edema. PMH includes HTN, obesity, endometrial cancer, CHF    PT Comments    Pt OOB in recliner upon arrival of PT, agreeable to PT session with focus on progression of ambulation tolerance and endurance. The pt was able to demo good tolerance for transfers without assist, performing 5xSTS (without AD) in 15.4 sec which is only slightly above cut off of 11.4 sec that indicates she is at increased risk for falls. The pt also completed 150 ft ambulation with 1L O2, but had 3 standing rest breaks where she required 2L O2 to recover to 95% SpO2 from 86-89%. The pt will continue to benefit from skilled PT to further progress functional endurance and activity tolerance in order to facilitate improved safety and independence following d/c.    Follow Up Recommendations  No PT follow up     Equipment Recommendations  None recommended by PT    Recommendations for Other Services       Precautions / Restrictions Precautions Precautions: Other (comment) Precaution Comments: Watch O2 Restrictions Weight Bearing Restrictions: No    Mobility  Bed Mobility               General bed mobility comments: pt in chair  Transfers Overall transfer level: Independent Equipment used: None             General transfer comment: able to sit/stand without need for AD or supervision. 5xsts in 15.4 sec  Ambulation/Gait Ambulation/Gait assistance: Supervision Gait Distance (Feet): 150 Feet Assistive device: Rolling walker (2 wheeled) Gait Pattern/deviations: Step-through pattern;Decreased stride length Gait velocity: decreased Gait velocity interpretation: <1.8 ft/sec, indicate of risk for recurrent  falls General Gait Details: pt initially ambulating in room without AD as she did not use AD during last session, pt asking for use of RW for balance during ambulation. the pt was able to maintain good stability with RW. on 1L O2 for most of ambulation, 3 standing rest breaks with 2 L O2 given to facilitate recover to 95% (pt dropped to 86-89% on 1L after 50 ft)   Stairs             Wheelchair Mobility    Modified Rankin (Stroke Patients Only)       Balance Overall balance assessment: No apparent balance deficits (not formally assessed);Needs assistance   Sitting balance-Leahy Scale: Good       Standing balance-Leahy Scale: Fair Standing balance comment: pt able to ambulate without AD, but reaching for counter/objects for UE support, asking to use RW                            Cognition Arousal/Alertness: Awake/alert Behavior During Therapy: WFL for tasks assessed/performed Overall Cognitive Status: Within Functional Limits for tasks assessed                                        Exercises      General Comments        Pertinent Vitals/Pain Pain Assessment: No/denies pain    Home Living  Prior Function            PT Goals (current goals can now be found in the care plan section) Acute Rehab PT Goals Patient Stated Goal: improve breathing and endurance PT Goal Formulation: With patient Time For Goal Achievement: 03/08/20 Potential to Achieve Goals: Good Progress towards PT goals: Progressing toward goals    Frequency    Min 3X/week      PT Plan Current plan remains appropriate    Co-evaluation              AM-PAC PT "6 Clicks" Mobility   Outcome Measure  Help needed turning from your back to your side while in a flat bed without using bedrails?: None Help needed moving from lying on your back to sitting on the side of a flat bed without using bedrails?: None Help needed moving to  and from a bed to a chair (including a wheelchair)?: None Help needed standing up from a chair using your arms (e.g., wheelchair or bedside chair)?: None Help needed to walk in hospital room?: A Little Help needed climbing 3-5 steps with a railing? : A Little 6 Click Score: 22    End of Session Equipment Utilized During Treatment: Oxygen;Gait belt Activity Tolerance: Patient tolerated treatment well Patient left: with call bell/phone within reach;in chair Nurse Communication: Mobility status(O2 needs) PT Visit Diagnosis: Other abnormalities of gait and mobility (R26.89)     Time: 1301-1330 PT Time Calculation (min) (ACUTE ONLY): 29 min  Charges:  $Gait Training: 23-37 mins                     Bridget Lamb, PT, DPT   Acute Rehabilitation Department Pager #: [phone]   Bridget Lamb 02/25/2020, 3:34 PM

## 2020-02-26 DIAGNOSIS — I5031 Acute diastolic (congestive) heart failure: Secondary | ICD-10-CM

## 2020-02-26 LAB — RENAL FUNCTION PANEL
Albumin: 3.5 g/dL (ref 3.5–5.0)
Anion gap: 8 (ref 5–15)
BUN: 26 mg/dL — ABNORMAL HIGH (ref 8–23)
CO2: 40 mmol/L — ABNORMAL HIGH (ref 22–32)
Calcium: 9.3 mg/dL (ref 8.9–10.3)
Chloride: 90 mmol/L — ABNORMAL LOW (ref 98–111)
Creatinine, Ser: 1.24 mg/dL — ABNORMAL HIGH (ref 0.44–1.00)
GFR calc Af Amer: 53 mL/min — ABNORMAL LOW (ref >60)
GFR calc non Af Amer: 46 mL/min — ABNORMAL LOW (ref >60)
Glucose, Bld: 107 mg/dL — ABNORMAL HIGH (ref 70–99)
Phosphorus: 3.7 mg/dL (ref 2.5–4.6)
Potassium: 3.1 mmol/L — ABNORMAL LOW (ref 3.5–5.1)
Sodium: 138 mmol/L (ref 135–145)

## 2020-02-26 LAB — MAGNESIUM: Magnesium: 1.9 mg/dL (ref 1.7–2.4)

## 2020-02-26 MED ORDER — TORSEMIDE 20 MG PO TABS: 20.0000 mg | ORAL_TABLET | Freq: Two times a day (BID) | ORAL | 1 refills | Status: DC

## 2020-02-26 MED ORDER — POTASSIUM CHLORIDE CRYS ER 20 MEQ PO TBCR
40.0000 meq | EXTENDED_RELEASE_TABLET | ORAL | Status: DC
  Administered 2020-02-26 (×2): 40 meq via ORAL
  Filled 2020-02-26 (×2): qty 2

## 2020-02-26 MED ORDER — SENNOSIDES-DOCUSATE SODIUM 8.6-50 MG PO TABS: 1.0000 | ORAL_TABLET | Freq: Two times a day (BID) | ORAL | Status: AC | PRN

## 2020-02-26 MED ORDER — POTASSIUM CHLORIDE CRYS ER 20 MEQ PO TBCR: 20.0000 meq | EXTENDED_RELEASE_TABLET | Freq: Two times a day (BID) | ORAL | 1 refills | Status: DC

## 2020-02-26 MED FILL — POTASSIUM CHLORIDE 20meqER: 20 | 30 days supply | Qty: 60 | Fill #0

## 2020-02-26 MED FILL — TORSEMIDE 20 MG TABLET: 20 | 30 days supply | Qty: 60 | Fill #0

## 2020-02-26 NOTE — Progress Notes (Signed)
Discharge education completed, home medication returned to patient from pharmacy.

## 2020-02-26 NOTE — Progress Notes (Signed)
SATURATION QUALIFICATIONS:  Patient Saturations on Room Air at Rest = 92%  Patient Saturations on Room Air while Ambulating = 90% after 50 steps oxygen saturation increased to 93% on RA.

## 2020-02-26 NOTE — Progress Notes (Signed)
2W10 [place]. Pt refused BIPAP for bedtime. SPO2 96% 1L [place] prior to sleep. Pt SPO2 dropped to 74% and had to increase [place] to 4L AND PT SPO2 99% now. Informed Dr. Isidore Moos via [place].

## 2020-02-26 NOTE — Discharge Summary (Signed)
Physician Discharge Summary  Bridget [place] [id number] DOB: May 28, 1956 DOA: 02/20/2020  PCP: [name], PA-C  Admit date: 02/20/2020 Discharge date: 02/26/2020  Admitted From: Home. Disposition: Home.  Recommendations for Outpatient Follow-up:  1. PCP and cardiology follow-up as below. 2. Encouraged follow-up at sleep clinic 3. Please obtain CBC/BMP/Mag at follow up 4. Please follow up on the following pending results: None  Home Health: None Equipment/Devices: None  Discharge Condition: Stable CODE STATUS: Full code  Follow-up Information    [name], PA-C. Schedule an appointment as soon as possible for a visit in 1 week(s).   Specialty: Physician Assistant Contact information: [place] [address] [phone]        Adrian Prows, MD. Schedule an appointment as soon as possible for a visit in 1 week(s).   Specialty: Cardiology Contact information: [place] [address] [phone]           Hospital Course: 64 year old female with history of diastolic CHF, obesity, OSA not on CPAP, HTN, hypothyroidism, asthma, IDA and endometrial cancer s/p TRH/BSO who presented to [facility] on 4/28 with dyspnea and hypoxia.  She presented to PCP with chest tightness and found to have saturation in the 50s on room air and sent to ED.  In ED, placed on 15 L NRB> BiPAP.  ABG 7.1 5/94/314/36 on 100% FiO2>> 7.2 2/74/66/33 on 40%.  BNP 562 from 164 in 2019.  WBC 13.4.  Influenza and COVID-19 PCR negative.  CXR with left base opacity.  EKG sinus rhythm at 95/min.  No acute ischemic finding.  CTA chest negative for PE but revealed pulmonary edema and atelectasis.  Patient was admitted to ICU for acute respiratory failure due to acute pulmonary edema in the setting of uncontrolled hypertension and diastolic CHF exacerbation.  She was diuresed with IV Lasix with improvement in her breathing.  She was transferred to [facility]  service on 4/30.  She was continued on IV Lasix and eventually transitioned to torsemide.  She continued to have good urine output.  She was discharged on torsemide 20 mg twice daily with K-Dur 20 milliequivalents twice daily.  She was counseled on fluid and sodium restriction as well as lifestyle change to lose weight.  On the day of discharge, patient was ambulated on room air and maintained appropriate saturation.  However, she had significant desaturation to 70s at night due to sleep apnea.  She has not been able to tolerate CPAP mask.   Discharged on 3 L by nasal cannula to be used at night.  Encouraged her to follow-up with the sleep clinic.   Patient was evaluated by physical and occupational therapy and no need was identified.  See individual problem list below for more hospital course.  Discharge Diagnoses:  Acute respiratory failure with hypoxia and hypercarbia-multifactorial including pulmonary edema, diastolic CHF, OSA and possible OHS.  Patient with diagnosis of OSA but not on CPAP.  Initial ABG on admission with acute respiratory acidosis with hypercarbia.  Ambulated on room air and maintained appropriate saturation but she continued to have nocturnal desaturation likely due to sleep apnea. -Discharged on 3 L by nasal cannula to be worn at night. -Encourage lifestyle change to lose weight. -Encouraged follow-up at sleep clinic.  Acute on chronic diastolic CHF/pulmonary edema/bilateral pleural effusion: BNP elevated.  CTA chest reveals pulmonary edema.  Echo with EF of 60 to 65%, G2-DD and moderate LAE.  CXR on 5/2 with improved aeration.  Responded to IV Lasix, and transitioned to p.o. torsemide and continue to diurese well.  Net -13 L during this hospitalization.  Renal function stable.  Mild alkalosis. -Discharged on p.o. torsemide 20 mg twice daily with Kdur 20 mEq twice daily. -Counseled on sodium and fluid restriction as well as daily weight.  Uncontrolled hypertension:  Normotensive.  On lisinopril and nifedipine at home.  Nifedipine might not be ideal in the setting of CHF.  -Continue home medications-defer changes to her cardiologist -Diuretics as above  OSA not on CPAP-could not tolerate CPAP mask. -Discharged on 3 L at night.  CKD-3B/azotemia: Baseline Cr 1.3-1.5> 1.24 (admit)> 1.60> 1.3> 1.28> 1.24 -Recheck renal function at follow-up.  Metabolic alkalosis: Likely due to diuretics.  Could be compensatory to CO2 retention -Recheck at follow-up.  Hypokalemia: Replenished prior to discharge. -Discharged on K-Dur 20 milliequivalents twice daily -Recheck at follow-up.  Iron deficiency anemia: Baseline Hgb 8-9> 10.3 (admit)>> 9.0> 8.9.  Denies melena or hematochezia.  Iron sat 9 9%.  TIBC 288.  Ferritin 80. -IV Feraheme on 4/30 and 5/3. -Continue ferrous sulfate with bowel regimen -Recheck CBC at follow-up.  Hypothyroidism: TSH within normal. -Continue home Synthroid  Morbid obesity:Body mass index is 45.45 kg/m. -Encouraged lifestyle change to lose weight.  History of endometrial cancer s/p TRH/BSO -Outpatient follow-up with Gyn as needed.                  Discharge Exam: Vitals:   02/26/20 0327 02/26/20 0859  BP: (!) 120/44 (!) 115/46  Pulse: 77 65  Resp: 19 19  Temp: 98 F (36.7 C) 98.1 F (36.7 C)  SpO2: 99% 99%    GENERAL: No apparent distress.  Nontoxic. HEENT: MMM.  Vision and hearing grossly intact.  NECK: Supple.  No apparent JVD.  RESP: On room air.  No IWOB.  Fair aeration bilaterally. CVS:  RRR. Heart sounds normal.  ABD/GI/GU: Bowel sounds present. Soft. Non tender.  MSK/EXT:  Moves extremities. No apparent deformity. No edema.  SKIN: no apparent skin lesion or wound NEURO: Awake, alert and oriented appropriately.  No apparent focal neuro deficit. PSYCH: Calm. Normal affect.  Discharge Instructions  Discharge Instructions    (HEART FAILURE PATIENTS) Call MD:  Anytime you have any of the  following symptoms: 1) 3 pound weight gain in 24 hours or 5 pounds in 1 week 2) shortness of breath, with or without a dry hacking cough 3) swelling in the hands, feet or stomach 4) if you have to sleep on extra pillows at night in order to breathe.   Complete by: As directed    Call MD for:  difficulty breathing, headache or visual disturbances   Complete by: As directed    Call MD for:  extreme fatigue   Complete by: As directed    Call MD for:  persistant dizziness or light-headedness   Complete by: As directed    Diet - low sodium heart healthy   Complete by: As directed    Discharge instructions   Complete by: As directed    It has been a pleasure taking care of you! You were hospitalized with difficulty breathing and low oxygen likely due to CHF exacerbation and sleep apnea.  You were treated with intravenous Lasix with improvement in your symptoms.  We are discharging you on torsemide for your CHF.  It is also important that you work with your doctors to discuss about CPAP.  Meanwhile, we are discharging you on oxygen to be used at  night.  Please review your new medication list and the directions before you take your medications.  In regards to your heart failure, we recommend limiting your fluid intake to less than 1500 cc (6 cups) a day and your sodium (salt) intake to less than 2 g (2000 mg) a day.  We also encourage you to check your weight daily and keep weight log.  It is important to lose some weight which could help with your heart failure as well as you sleep apnea.   Take care,   Increase activity slowly   Complete by: As directed      Allergies as of 02/26/2020   No Known Allergies     Medication List    STOP taking these medications   furosemide 40 MG tablet Commonly known as: LASIX     TAKE these medications   albuterol 108 (90 Base) MCG/ACT inhaler Commonly known as: VENTOLIN HFA Inhale 2 puffs into the lungs every 4 (four) hours as needed for wheezing or  shortness of breath.   aspirin EC 81 MG tablet Take 81 mg by mouth daily.   CALCIUM 600+D PO Take 1 tablet by mouth daily.   cetirizine 10 MG tablet Commonly known as: ZYRTEC Take 10 mg by mouth daily.   Integra Plus Caps Take 1 capsule by mouth daily.   levothyroxine 150 MCG tablet Commonly known as: SYNTHROID Take 150 mcg by mouth daily before breakfast.   lisinopril 20 MG tablet Commonly known as: ZESTRIL Take 20 mg by mouth daily.   NIFEdipine 30 MG 24 hr tablet Commonly known as: PROCARDIA-XL/NIFEDICAL-XL Take 30 mg by mouth daily.   potassium chloride SA 20 MEQ tablet Commonly known as: KLOR-CON Take 1 tablet (20 mEq total) by mouth 2 (two) times daily.   rosuvastatin 10 MG tablet Commonly known as: CRESTOR Take 10 mg by mouth at bedtime.   senna-docusate 8.6-50 MG tablet Commonly known as: Senokot-S Take 1 tablet by mouth 2 (two) times daily as needed for moderate constipation.   torsemide 20 MG tablet Commonly known as: DEMADEX Take 1 tablet (20 mg total) by mouth 2 (two) times daily.       Consultations:  PCCM  Procedures/Studies:  2D Echo on 02/21/2020 1. Left ventricular ejection fraction, by estimation, is 60 to 65%. The  left ventricle has normal function. The left ventricle has no regional  wall motion abnormalities. There is mild concentric left ventricular  hypertrophy. Left ventricular diastolic  parameters are consistent with Grade II diastolic dysfunction  (pseudonormalization). Elevated left ventricular end-diastolic pressure.  2. Right ventricular systolic function is normal. The right ventricular  size is normal. There is mildly elevated pulmonary artery systolic  pressure.  3. Left atrial size was moderately dilated.  4. The mitral valve is normal in structure. Trivial mitral valve  regurgitation. No evidence of mitral stenosis.  5. The aortic valve was not well visualized. Aortic valve regurgitation  is not visualized. No  aortic stenosis is present.  6. The inferior vena cava is dilated in size with <50% respiratory  variability, suggesting right atrial pressure of 15 mmHg.    DG Chest 1 View  Result Date: 02/24/2020 CLINICAL DATA:  shob presented to [facility] ED 4/28 with dyspnea and hypoxia. EXAM: CHEST  1 VIEW COMPARISON:  Chest radiograph 02/21/2020 FINDINGS: Stable cardiomediastinal contours with enlarged heart size. Improved aeration in the bilateral lung bases. There is persistent diffuse bilateral mild interstitial opacities. Small bilateral pleural effusions. No pneumothorax. No acute finding  in the visualized skeleton. IMPRESSION: Improved aeration in the bilateral lung bases. Persistent diffuse bilateral interstitial opacities with small bilateral pleural effusions. Electronically Signed   By: Audie Pinto M.D.   On: 02/24/2020 12:44   CT Angio Chest PE W and/or Wo Contrast  Result Date: 02/20/2020 CLINICAL DATA:  Shortness of breath and chest pain. Decreased oxygen saturation. EXAM: CT ANGIOGRAPHY CHEST WITH CONTRAST TECHNIQUE: Multidetector CT imaging of the chest was performed using the standard protocol during bolus administration of intravenous contrast. Multiplanar CT image reconstructions and MIPs were obtained to evaluate the vascular anatomy. CONTRAST:  63mL OMNIPAQUE IOHEXOL 350 MG/ML SOLN COMPARISON:  CT abdomen pelvis 10/16/2019 FINDINGS: Cardiovascular: Heart is enlarged. Aortic atherosclerosis. No pericardial effusion. Adequate opacification of the pulmonary arterial system. Dilated main pulmonary artery as can be seen with pulmonary arterial hypertension. Motion artifact limits evaluation particularly within the lower lobes. No intraluminal filling defect identified to suggest acute pulmonary embolus. Mediastinum/Nodes: No enlarged axillary adenopathy. There is a prominent 10 mm right paratracheal lymph node. Additional prominent subcentimeter prevascular and subcarinal lymph nodes are demonstrated.  Normal appearance of the esophagus. Lungs/Pleura: Expiratory phase imaging. Subpleural and scattered areas of consolidation predominantly involving the lower lobes bilaterally. Patchy ground-glass opacities throughout the aerated lungs. Peripheral consolidation within the right upper lobe (image 52; series 6). Additionally there are smaller scattered nodules and focal areas of consolidation involving the upper lobes bilaterally. Moderate bilateral pleural effusions. No pneumothorax. Upper Abdomen: No acute process. Musculoskeletal: No aggressive or acute appearing osseous lesions. Thoracic spine degenerative changes. Review of the MIP images confirms the above findings. IMPRESSION: 1. No evidence for acute pulmonary embolus. 2. Patchy ground-glass and consolidative opacities throughout the aerated lungs bilaterally which may represent edema or infection. Additionally, there are scattered areas of consolidation within the lungs bilaterally which may represent atelectasis or infection. Consider follow-up chest CT in 6-8 weeks after resolution of acute symptomatology to ensure the nodularity resolves. 3. Moderate bilateral pleural effusions. 4. Prominent mediastinal lymph nodes, likely reactive. 5. Dilated main pulmonary artery as can be seen with pulmonary arterial hypertension. 6. Aortic atherosclerosis. Aortic Atherosclerosis (ICD10-I70.0). Electronically Signed   By: Lovey Newcomer M.D.   On: 02/20/2020 16:50   DG Chest Port 1 View  Result Date: 02/21/2020 CLINICAL DATA:  Respiratory failure. EXAM: PORTABLE CHEST 1 VIEW COMPARISON:  February 20, 2020. FINDINGS: Stable cardiomediastinal silhouette with mild central pulmonary vascular congestion. No pneumothorax is noted. Increased perihilar and basilar opacities are noted concerning for edema or infiltrates with mild bilateral pleural effusions. Bony thorax is unremarkable. IMPRESSION: Stable mild central pulmonary vascular congestion. Increased perihilar and basilar  opacities are noted concerning for edema or infiltrates with mild bilateral pleural effusions. Electronically Signed   By: Marijo Conception M.D.   On: 02/21/2020 08:25   DG Chest Port 1 View  Result Date: 02/20/2020 CLINICAL DATA:  Shortness of breath and chest pressure EXAM: PORTABLE CHEST 1 VIEW COMPARISON:  October 22, 2019 FINDINGS: There is ill-defined airspace opacity in the lateral left base. The lungs otherwise are clear. Heart is upper normal in size with pulmonary vascularity normal. No adenopathy. No bone lesions. IMPRESSION: Ill-defined opacity lateral left base, concerning for pneumonia. Lungs elsewhere clear. Stable cardiac silhouette. No adenopathy demonstrable. Electronically Signed   By: Lowella Grip III M.D.   On: 02/20/2020 12:43   ECHOCARDIOGRAM COMPLETE  Result Date: 02/21/2020    ECHOCARDIOGRAM REPORT   Patient Name:   CHIKITA ALEXIS Guerrero Date of Exam: 02/21/2020  Medical Rec #:  [id number]           Height:       64.0 in Accession #:    [id number]          Weight:       269.6 lb Date of Birth:  11-Feb-1956           BSA:          2.221 m Patient Age:    19 years            BP:           158/68 mmHg Patient Gender: F                   HR:           73 bpm. Exam Location:  Inpatient Procedure: 2D Echo, Cardiac Doppler, Color Doppler and Intracardiac            Opacification Agent Indications:    Acute Pulmonary Edema [id number]  History:        Patient has prior history of Echocardiogram examinations, most                 recent 08/30/2018. CHF; Risk Factors:Hypertension and                 Dyslipidemia. Cancer. CKD.  Sonographer:    Jonelle Sidle Dance Referring Phys: [id number] RAHUL P DESAI IMPRESSIONS  1. Left ventricular ejection fraction, by estimation, is 60 to 65%. The left ventricle has normal function. The left ventricle has no regional wall motion abnormalities. There is mild concentric left ventricular hypertrophy. Left ventricular diastolic parameters are consistent with Grade II  diastolic dysfunction (pseudonormalization). Elevated left ventricular end-diastolic pressure.  2. Right ventricular systolic function is normal. The right ventricular size is normal. There is mildly elevated pulmonary artery systolic pressure.  3. Left atrial size was moderately dilated.  4. The mitral valve is normal in structure. Trivial mitral valve regurgitation. No evidence of mitral stenosis.  5. The aortic valve was not well visualized. Aortic valve regurgitation is not visualized. No aortic stenosis is present.  6. The inferior vena cava is dilated in size with <50% respiratory variability, suggesting right atrial pressure of 15 mmHg. FINDINGS  Left Ventricle: Left ventricular ejection fraction, by estimation, is 60 to 65%. The left ventricle has normal function. The left ventricle has no regional wall motion abnormalities. Definity contrast agent was given IV to delineate the left ventricular  endocardial borders. The left ventricular internal cavity size was normal in size. There is mild concentric left ventricular hypertrophy. Left ventricular diastolic parameters are consistent with Grade II diastolic dysfunction (pseudonormalization). Elevated left ventricular end-diastolic pressure. Right Ventricle: The right ventricular size is normal. No increase in right ventricular wall thickness. Right ventricular systolic function is normal. There is mildly elevated pulmonary artery systolic pressure. The tricuspid regurgitant velocity is 2.47  m/s, and with an assumed right atrial pressure of 15 mmHg, the estimated right ventricular systolic pressure is [id number] mmHg. Left Atrium: Left atrial size was moderately dilated. Right Atrium: Right atrial size was normal in size. Pericardium: Trivial pericardial effusion is present. Mitral Valve: The mitral valve is normal in structure. Normal mobility of the mitral valve leaflets. Trivial mitral valve regurgitation. No evidence of mitral valve stenosis. Tricuspid Valve:  The tricuspid valve is normal in structure. Tricuspid valve regurgitation is mild . No evidence of tricuspid stenosis. Aortic Valve: The aortic valve was not well visualized. Aortic  valve regurgitation is not visualized. No aortic stenosis is present. Pulmonic Valve: The pulmonic valve was normal in structure. Pulmonic valve regurgitation is trivial. No evidence of pulmonic stenosis. Aorta: The aortic root is normal in size and structure. Venous: The inferior vena cava is dilated in size with less than 50% respiratory variability, suggesting right atrial pressure of 15 mmHg. IAS/Shunts: No atrial level shunt detected by color flow Doppler.  LEFT VENTRICLE PLAX 2D LVIDd:         4.96 cm  Diastology LVIDs:         3.57 cm  LV e' lateral:   5.77 cm/s LV PW:         1.30 cm  LV E/e' lateral: 23.7 LV IVS:        1.09 cm  LV e' medial:    6.09 cm/s LVOT diam:     2.00 cm  LV E/e' medial:  22.5 LV SV:         92 LV SV Index:   41 LVOT Area:     3.14 cm  RIGHT VENTRICLE             IVC RV Basal diam:  2.59 cm     IVC diam: 2.72 cm RV S prime:     12.20 cm/s TAPSE (M-mode): 2.3 cm LEFT ATRIUM             Index       RIGHT ATRIUM           Index LA diam:        4.80 cm 2.16 cm/m  RA Area:     18.20 cm LA Vol (A2C):   77.2 ml 34.76 ml/m RA Volume:   44.70 ml  20.12 ml/m LA Vol (A4C):   69.6 ml 31.33 ml/m LA Biplane Vol: 73.9 ml 33.27 ml/m  AORTIC VALVE LVOT Vmax:   125.00 cm/s LVOT Vmean:  82.700 cm/s LVOT VTI:    0.293 m  AORTA Ao Root diam: 2.90 cm Ao Asc diam:  3.30 cm MITRAL VALVE                TRICUSPID VALVE MV Area (PHT): 3.72 cm     TR Peak grad:   24.4 mmHg MV Decel Time: 204 msec     TR Vmax:        247.00 cm/s MV E velocity: 137.00 cm/s MV A velocity: 133.00 cm/s  SHUNTS MV E/A ratio:  1.03         Systemic VTI:  0.29 m                             Systemic Diam: 2.00 cm Skeet Latch MD Electronically signed by Skeet Latch MD Signature Date/Time: 02/21/2020/1:32:25 PM    Final         The  results of significant diagnostics from this hospitalization (including imaging, microbiology, ancillary and laboratory) are listed below for reference.     Microbiology: Recent Results (from the past 240 hour(s))  Respiratory Panel by RT PCR (Flu A&B, Covid) - Nasopharyngeal Swab     Status: None   Collection Time: 02/20/20 12:47 PM   Specimen: Nasopharyngeal Swab  Result Value Ref Range Status   SARS Coronavirus 2 by RT PCR NEGATIVE NEGATIVE Final    Comment: (NOTE) SARS-CoV-2 target nucleic acids are NOT DETECTED. The SARS-CoV-2 RNA is generally detectable in upper respiratoy specimens during the acute phase of infection.  The lowest concentration of SARS-CoV-2 viral copies this assay can detect is 131 copies/mL. A negative result does not preclude SARS-Cov-2 infection and should not be used as the sole basis for treatment or other patient management decisions. A negative result may occur with  improper specimen collection/handling, submission of specimen other than nasopharyngeal swab, presence of viral mutation(s) within the areas targeted by this assay, and inadequate number of viral copies (<131 copies/mL). A negative result must be combined with clinical observations, patient history, and epidemiological information. The expected result is Negative. Fact Sheet for Patients:  PinkCheek.be Fact Sheet for Healthcare Providers:  GravelBags.it This test is not yet ap proved or cleared by the [place] FDA and  has been authorized for detection and/or diagnosis of SARS-CoV-2 by FDA under an Emergency Use Authorization (EUA). This EUA will remain  in effect (meaning this test can be used) for the duration of the COVID-19 declaration under Section 564(b)(1) of the Act, 21 U.S.C. section 360bbb-3(b)(1), unless the authorization is terminated or revoked sooner.    Influenza A by PCR NEGATIVE NEGATIVE Final   Influenza B by  PCR NEGATIVE NEGATIVE Final    Comment: (NOTE) The Xpert Xpress SARS-CoV-2/FLU/RSV assay is intended as an aid in  the diagnosis of influenza from Nasopharyngeal swab specimens and  should not be used as a sole basis for treatment. Nasal washings and  aspirates are unacceptable for Xpert Xpress SARS-CoV-2/FLU/RSV  testing. Fact Sheet for Patients: PinkCheek.be Fact Sheet for Healthcare Providers: GravelBags.it This test is not yet approved or cleared by the [place] FDA and  has been authorized for detection and/or diagnosis of SARS-CoV-2 by  FDA under an Emergency Use Authorization (EUA). This EUA will remain  in effect (meaning this test can be used) for the duration of the  Covid-19 declaration under Section 564(b)(1) of the Act, 21  U.S.C. section 360bbb-3(b)(1), unless the authorization is  terminated or revoked. Performed at [facility], [place] [address], [place], [place] [address]   MRSA PCR Screening     Status: None   Collection Time: 02/20/20 11:59 PM   Specimen: Nasopharyngeal  Result Value Ref Range Status   MRSA by PCR NEGATIVE NEGATIVE Final    Comment:        The GeneXpert MRSA Assay (FDA approved for NASAL specimens only), is one component of a comprehensive MRSA colonization surveillance program. It is not intended to diagnose MRSA infection nor to guide or monitor treatment for MRSA infections. Performed at [facility], [place] [address], [place], [place] [address]      Labs: BNP (last 3 results) Recent Labs    02/20/20 1253  BNP [id number]*   Basic Metabolic Panel: Recent Labs  Lab 02/21/20 0419 02/21/20 1220 02/22/20 0346 02/23/20 0227 02/24/20 0409 02/25/20 0338 02/26/20 0246  NA 143   < > 140 141 141 142 138  K 5.9*   < > 4.3 3.7 3.8 3.8 3.1*  CL 103   < > 98 96* 93* 90* 90*  CO2 28   < > 32 35* 40* 42* 40*  GLUCOSE 117*   < > 105* 100* 102* 100* 107*  BUN 30*   < >  39* 37* 33* 31* 26*  CREATININE 1.34*   < > 1.30* 1.17* 1.21* 1.28* 1.24*  CALCIUM 9.3   < > 9.2 9.4 9.4 9.2 9.3  MG 2.2  --   --  1.8 1.8 1.8 1.9  PHOS 5.5*  --   --  4.0 4.6 3.6 3.7   < > = values in this interval not displayed.   Liver Function Tests: Recent Labs  Lab 02/20/20 1253 02/23/20 0227 02/24/20 0409 02/25/20 0338 02/26/20 0246  AST 20  --   --   --   --   ALT 16  --   --   --   --   ALKPHOS 83  --   --   --   --   BILITOT 0.8  --   --   --   --   PROT 7.5  --   --   --   --   ALBUMIN 3.9 3.1* 3.3* 3.2* 3.5   No results for input(s): LIPASE, AMYLASE in the last 168 hours. No results for input(s): AMMONIA in the last 168 hours. CBC: Recent Labs  Lab 02/20/20 1253 02/20/20 1312 02/20/20 1823 02/21/20 0419 02/22/20 0346 02/23/20 0227 02/25/20 0338  WBC 13.4*  --   --  10.0 9.5 7.4 7.2  NEUTROABS 11.0*  --   --   --   --   --   --   HGB 10.3*   < > 11.2* 9.9* 9.0* 8.8* 8.9*  HCT 36.2   < > 33.0* 34.1* 30.2* 29.9* 30.4*  MCV 100.6*  --   --  100.3* 96.5 97.7 97.7  PLT 220  --   --  178 164 173 175   < > = values in this interval not displayed.   Cardiac Enzymes: No results for input(s): CKTOTAL, CKMB, CKMBINDEX, TROPONINI in the last 168 hours. BNP: Invalid input(s): POCBNP CBG: Recent Labs  Lab 02/25/20 2048  GLUCAP 106*   D-Dimer No results for input(s): DDIMER in the last 72 hours. Hgb A1c No results for input(s): HGBA1C in the last 72 hours. Lipid Profile No results for input(s): CHOL, HDL, LDLCALC, TRIG, CHOLHDL, LDLDIRECT in the last 72 hours. Thyroid function studies No results for input(s): TSH, T4TOTAL, T3FREE, THYROIDAB in the last 72 hours.  Invalid input(s): FREET3 Anemia work up No results for input(s): VITAMINB12, FOLATE, FERRITIN, TIBC, IRON, RETICCTPCT in the last 72 hours. Urinalysis    Component Value Date/Time   COLORURINE YELLOW 10/22/2019 1153   APPEARANCEUR CLEAR 10/22/2019 1153   LABSPEC 1.010 10/22/2019 1153    PHURINE 6.0 10/22/2019 1153   GLUCOSEU NEGATIVE 10/22/2019 1153   HGBUR MODERATE (A) 10/22/2019 1153   BILIRUBINUR NEGATIVE 10/22/2019 1153   KETONESUR NEGATIVE 10/22/2019 1153   PROTEINUR NEGATIVE 10/22/2019 1153   UROBILINOGEN 1.0 10/06/2013 1752   NITRITE NEGATIVE 10/22/2019 1153   LEUKOCYTESUR NEGATIVE 10/22/2019 1153   Sepsis Labs Invalid input(s): PROCALCITONIN,  WBC,  LACTICIDVEN   Time coordinating discharge: 40 minutes  SIGNED:  Mercy Riding, MD  [facility] 02/26/2020, 10:30 AM  If 7PM-7AM, please contact night-coverage [email or web address] Password [email or web address]

## 2020-02-29 ENCOUNTER — Other Ambulatory Visit: Payer: Self-pay | Admitting: Internal Medicine

## 2020-02-29 DIAGNOSIS — Z1231 Encounter for screening mammogram for malignant neoplasm of breast: Secondary | ICD-10-CM

## 2020-03-04 NOTE — Progress Notes (Signed)
Primary Physician/Referring:  [name], PA-C  Patient ID: Bridget Lamb, female    DOB: 16-Oct-1956, 64 y.o.   MRN: [id number]  Chief Complaint  Patient presents with  . Congestive Heart Failure  . Hospitalization Follow-up   HPI:    Bridget Lamb  is a 64 y.o. female with morbid obesity BMI 44, hypertension, restrictive airway disease due to bronchial asthma, hyperlipidemia, HFpEF, OSA and not using CPAP due to feeling claustrophobic is here for 6 month and also post hospital admission follow up. Patient was admitted to ICU at the Hospital on 02/26/2020 for acute respiratory failure due to acute pulmonary edema in the setting of uncontrolled hypertension and diastolic CHF exacerbation.  States that symptoms of dyspnea has improved but still persist, occasionally has noticed desaturation on pulse oximeter.  No chest pain or palpitation.  She has slowly increase her physical activity.  No PND or orthopnea.  Past Medical History:  Diagnosis Date  . Allergic rhinitis   . Anemia    hematology, Dr. Julien Nordmann, last 01/2011  . Asthma   . CHF (congestive heart failure) ([place]) 2004  . Chronic kidney disease    protein in urine  . Complication of anesthesia   . Dyslipidemia   . Dyspnea    on excertion  . Endometrial cancer ([place])   . H/O echocardiogram 01/07/04   mild LVH, nl LV systolic function, EF 18%, left atrial enlargement, mildly thickened aortic and mitral valves  . History of thyroid cancer   . Hypertension   . Incarcerated ventral hernia 10/07/2013  . Obesity, Class III, BMI 40-49.9 (morbid obesity) ([place]) 10/09/2013  . Osteopenia   . PONV (postoperative nausea and vomiting)   . Umbilical hernia   . Unspecified essential hypertension 10/09/2013  . Unspecified hypothyroidism 10/09/2013   Past Surgical History:  Procedure Laterality Date  . BONE MARROW BIOPSY    . CARDIAC CATHETERIZATION  2005  . CHOLECYSTECTOMY  2003  . COLONOSCOPY  2008  . ROBOTIC ASSISTED  TOTAL HYSTERECTOMY WITH BILATERAL SALPINGO OOPHERECTOMY N/A 10/25/2019   Procedure: XI ROBOTIC ASSISTED TOTAL HYSTERECTOMY WITH BILATERAL SALPINGO OOPHORECTOMY;  Surgeon: Lafonda Mosses, MD;  Location: [facility];  Service: Gynecology;  Laterality: N/A;  . SENTINEL NODE BIOPSY N/A 10/25/2019   Procedure: SENTINEL LYMPH NODE BIOPSY;  Surgeon: Lafonda Mosses, MD;  Location: [facility];  Service: Gynecology;  Laterality: N/A;  . THYROID SURGERY     partial thyroidectomy  . TOTAL THYROIDECTOMY  2009  . VENTRAL HERNIA REPAIR N/A 10/07/2013   Procedure: HERNIA REPAIR VENTRAL ADULT;  Surgeon: Gwenyth Ober, MD;  Location: [facility];  Service: General;  Laterality: N/A;   Family History  Problem Relation Age of Onset  . Hypertension Mother   . Pancreatic cancer Mother   . Breast cancer Maternal Aunt   . Lung cancer Father   . Ovarian cancer Neg Hx   . Endometrial cancer Neg Hx   . Colon cancer Neg Hx     Social History   Tobacco Use  . Smoking status: Never Smoker  . Smokeless tobacco: Never Used  Substance Use Topics  . Alcohol use: No   Marital Status: Divorced  ROS  Review of Systems  Constitution: Negative for weight gain.  Cardiovascular: Positive for dyspnea on exertion and leg swelling. Negative for chest pain, claudication, palpitations and syncope.  Respiratory: Positive for snoring (has sleep apnea unable to wear CPAP). Negative for shortness of breath.   Musculoskeletal: Positive for  joint pain (knee and hips chronic). Negative for joint swelling.   Objective  Blood pressure (!) 113/59, pulse 83, temperature 98 F (36.7 C), temperature source Temporal, resp. rate 16, height _0  (1.626 m), weight 250 lb 9.6 oz (113.7 kg), last menstrual period 10/22/2011, SpO2 97 %.  Vitals with BMI 03/05/2020 02/26/2020 02/26/2020  Height _1  - -  Weight 250 lbs 10 oz - -  BMI 93.90 - -  Systolic 300 923 -  Diastolic 59 51 -  Pulse 83 68 66     Physical Exam  Constitutional: She  appears well-developed and well-nourished. No distress.  Cardiovascular: Normal rate, regular rhythm and intact distal pulses. Exam reveals no gallop.  Murmur heard.  Early systolic murmur is present with a grade of 1/6 at the apex. 2+ bilateral lower extremity, superficial varicose veins noted. Chronic venostasis pigmentation noted.   Pulmonary/Chest: Effort normal and breath sounds normal. No accessory muscle usage. No respiratory distress.  Abdominal: Soft.   Laboratory examination:   Recent Labs    02/24/20 0409 02/25/20 0338 02/26/20 0246  NA 141 142 138  K 3.8 3.8 3.1*  CL 93* 90* 90*  CO2 40* 42* 40*  GLUCOSE 102* 100* 107*  BUN 33* 31* 26*  CREATININE 1.21* 1.28* 1.24*  CALCIUM 9.4 9.2 9.3  GFRNONAA 47* 44* 46*  GFRAA 55* 51* 53*   estimated creatinine clearance is 56.7 mL/min (A) (by C-G formula based on SCr of 1.24 mg/dL (H)).  CMP Latest Ref Rng & Units 02/26/2020 02/25/2020 02/24/2020  Glucose 70 - 99 mg/dL 107(H) 100(H) 102(H)  BUN 8 - 23 mg/dL 26(H) 31(H) 33(H)  Creatinine 0.44 - 1.00 mg/dL 1.24(H) 1.28(H) 1.21(H)  Sodium 135 - 145 mmol/L 138 142 141  Potassium 3.5 - 5.1 mmol/L 3.1(L) 3.8 3.8  Chloride 98 - 111 mmol/L 90(L) 90(L) 93(L)  CO2 22 - 32 mmol/L 40(H) 42(H) 40(H)  Calcium 8.9 - 10.3 mg/dL 9.3 9.2 9.4  Total Protein 6.5 - 8.1 g/dL - - -  Total Bilirubin 0.3 - 1.2 mg/dL - - -  Alkaline Phos 38 - 126 U/L - - -  AST 15 - 41 U/L - - -  ALT 0 - 44 U/L - - -   CBC Latest Ref Rng & Units 02/25/2020 02/23/2020 02/22/2020  WBC 4.0 - 10.5 K/uL 7.2 7.4 9.5  Hemoglobin 12.0 - 15.0 g/dL 8.9(L) 8.8(L) 9.0(L)  Hematocrit 36.0 - 46.0 % 30.4(L) 29.9(L) 30.2(L)  Platelets 150 - 400 K/uL 175 173 164   TSH Recent Labs    02/20/20 2149  TSH 1.263   BNP (last 3 results) Recent Labs    02/20/20 1253  BNP 562.6*   Magnesium:  Recent Labs    02/24/20 0409 02/25/20 0338 02/26/20 0246  MG 1.8 1.8 1.9     External labs:   02/22/20:  Vitamin B12 347.  Iron 26  (L), TIBC 288, Saturation Ratios 9 (L), UIBC 262.    Labs 12/05/2019: Total cholesterol 158, triglycerides 102, HDL 53, LDL 86.    Medications and allergies  No Known Allergies   Current Outpatient Medications  Medication Instructions  . albuterol (PROVENTIL HFA;VENTOLIN HFA) 108 (90 Base) MCG/ACT inhaler 2 puffs, Inhalation, Every 4 hours PRN  . aspirin EC 81 mg, Oral, Daily  . Calcium Carbonate-Vitamin D (CALCIUM 600+D PO) 1 tablet, Oral, Daily  . cetirizine (ZYRTEC) 10 mg, Oral, Daily  . FeFum-FePoly-FA-B Cmp-C-Biot (INTEGRA PLUS) CAPS 1 capsule, Oral, Daily  . levothyroxine (SYNTHROID)  150 mcg, Oral, Daily before breakfast  . lisinopril (ZESTRIL) 20 mg, Oral, Daily  . metoprolol succinate (TOPROL-XL) 25 mg, Oral, Daily, Take with or immediately following a meal.  . potassium chloride SA (KLOR-CON) 20 MEQ tablet 20 mEq, Oral, 2 times daily  . rosuvastatin (CRESTOR) 10 mg, Oral, Daily at bedtime  . senna-docusate (SENOKOT-S) 8.6-50 MG tablet 1 tablet, Oral, 2 times daily PRN  . torsemide (DEMADEX) 20 mg, Oral, 2 times daily   Radiology:     CT angiography chest with contrast 02/20/2020: 1. No evidence for acute pulmonary embolus. 2. Patchy ground-glass and consolidative opacities throughout the aerated lungs bilaterally which may represent edema or infection. Additionally, there are scattered areas of consolidation within the lungs bilaterally which may represent atelectasis or infection. Consider follow-up chest CT in 6-8 weeks after resolution of acute symptomatology to ensure the nodularity resolves. 3. Moderate bilateral pleural effusions. 4. Prominent mediastinal lymph nodes, likely reactive. 5. Dilated main pulmonary artery as can be seen with pulmonary arterial hypertension. 6. Aortic atherosclerosis.   Cardiac Studies:   Lexiscan Sestamibi Stress Test 01/29/2014: There is a very small and very mild fixed defect in the mid anterior wall consistent with breast  attenuation.. No reversible  perfusion defects are identified.  Echocardiogram 08/29/2018:  Left ventricle: The cavity size was normal. Systolic function was   normal. The estimated ejection fraction was in the range of 60%   to 65%. Images were inadequate for LV wall motion assessment. Abnormal septal shudder as well as respiratory related leftward   septal shift. Doppler parameters are consistent with high   ventricular filling pressure (E/e&' = 23). - Aortic valve: There was no stenosis. There was no regurgitation.   Mean gradient (S): 6 mm Hg. Valve area (VTI): 1.89 cm^2. Valve   area (Vmax): 1.77 cm^2. Valve area (Vmean): 1.63 cm^2. - Mitral valve: Calcified annulus.  There was mild regurgitation. - Left atrium: The atrium was moderately to severely dilated. - Right ventricle: The cavity size was normal. Wall thickness was   normal. Systolic function was normal. - Right atrium: The atrium was mildly dilated. - Tricuspid valve: There was trivial regurgitation. - Inferior vena cava: The vessel was dilated. The respirophasic  diameter changes were blunted (< 50%), consistent with elevated   central venous pressure. - Pericardium, extracardiac: A trivial pericardial effusion was   identified anterior to the heart. - Increased flow velocities, may be secondary to anemia,   thyrotoxicosis, or high flow state.   Echocardiogram 02/21/2020: 1. Left ventricular ejection fraction, by estimation, is 60 to 65%. The left ventricle has normal function. The left ventricle has no regional wall motion abnormalities. There is mild concentric left ventricular hypertrophy.  Left ventricular diastolic parameters are consistent with Grade II diastolic dysfunction (pseudonormalization).  Elevated left ventricular end-diastolic pressure.  2. Right ventricular systolic function is normal. The right ventricular size is normal. There is mildly elevated pulmonary artery systolic pressure.  3. Left atrial size was  moderately dilated.  4. The mitral valve is normal in structure. Trivial mitral valve regurgitation. No evidence of mitral stenosis.  5. The aortic valve was not well visualized. Aortic valve regurgitation is not visualized. No aortic stenosis is present.  6. The inferior vena cava is dilated in size with <50% respiratory variability, suggesting right atrial pressure of 15 mmHg.   EKG    02/20/20: Sinus rhythm. Left posterior fascicular block. Low voltage, precordial leads. Consider anterior infarct.  09/26/2019: Normal sinus rhythm at rate  of 82 bpm, normal axis, poor R-wave progression, cannot exclude anteroseptal infarct old.  Low voltage complexes, pulmonary disease pattern.  08/29/2018: Normal sinus rhythm at the rate of 93 bpm, left atrial abnormality, rightward axis, poor R-wave progression, nonspecific ST abnormality, low voltage complexes.  Pulmonary disease pattern.  Prolonged QT.   Assessment     ICD-10-CM   1. Acute on chronic diastolic heart failure (HCC)  I50.33 PCV MYOCARDIAL PERFUSION WITH LEXISCAN  2. Dyspnea on exertion  R06.00 PCV MYOCARDIAL PERFUSION WITH LEXISCAN  3. Essential hypertension, benign  I10 metoprolol succinate (TOPROL-XL) 25 MG 24 hr tablet  4. Bilateral leg edema  R60.0   5. Class 3 severe obesity due to excess calories without serious comorbidity with body mass index (BMI) of 40.0 to 44.9 in adult (HCC)  E66.01    Z68.41   6. Stage 3a chronic kidney disease  N18.31      Meds ordered this encounter  Medications  . metoprolol succinate (TOPROL-XL) 25 MG 24 hr tablet    Sig: Take 1 tablet (25 mg total) by mouth daily. Take with or immediately following a meal.    Dispense:  30 tablet    Refill:  2    Stop Nifidipine    Medications Discontinued During This Encounter  Medication Reason  . NIFEdipine (PROCARDIA-XL/ADALAT-CC/NIFEDICAL-XL) 30 MG 24 hr tablet Change in therapy    Recommendations:   Bridget Lamb  is a 64 y.o. female with morbid  obesity BMI 44, hypertension, restrictive airway disease due to bronchial asthma, hyperlipidemia, HFpEF, OSA and not using CPAP due to feeling claustrophobic is here for 6 month and also post hospital admission follow up. Patient was admitted to ICU at the Hospital on 02/26/2020 for acute respiratory failure due to acute pulmonary edema in the setting of uncontrolled hypertension and diastolic CHF exacerbation.  Dyspnea persist and she is occasionally has had mild desaturation.  I have encouraged her to start using CPAP, she is now willing to try nasal mask and will discuss with Ms. Mattie Marlin, PA-C.  Blood pressure is now controlled however would like to discontinue Procardia and switch her to low-dose beta-blocker metoprolol succinate 25 mg daily.  Continue ARB.  We had a long discussion regarding obesity, advised her and encouraged her to join a program at least weight watchers to see if she would benefit from this.  She appears to be willing to try and look into this.  Continue diuretics for now she still has 2+ leg edema, to exclude progression of coronary disease or new coronary artery disease as it has been greater than 5 years since prior stress testing, I have recommended obtaining a stress test.  She is unable to exercise on a treadmill due to bilateral knee degenerative joint disease.  I would like to see her back in 4 to 6 weeks for follow-up.  Reviewed her labs, renal function is remained stable.  Lipids are under control.  She is presently on statin.  4-minute office visit and evaluation and management of complex medical issues and also review of medical records.  Adrian Prows, MD, [facility] 03/05/2020, 1:27 PM [facility] [place] Pager: [phone] Office: [phone]

## 2020-03-05 ENCOUNTER — Encounter: Payer: Self-pay | Admitting: Cardiology

## 2020-03-05 ENCOUNTER — Ambulatory Visit: Payer: BC Managed Care – PPO | Admitting: Cardiology

## 2020-03-05 ENCOUNTER — Other Ambulatory Visit: Payer: Self-pay

## 2020-03-05 VITALS — BP 113/59 | HR 83 | Temp 98.0°F | Resp 16 | Ht 64.0 in | Wt 250.6 lb

## 2020-03-05 DIAGNOSIS — N1831 Chronic kidney disease, stage 3a: Secondary | ICD-10-CM

## 2020-03-05 DIAGNOSIS — R0600 Dyspnea, unspecified: Secondary | ICD-10-CM

## 2020-03-05 DIAGNOSIS — R0609 Other forms of dyspnea: Secondary | ICD-10-CM

## 2020-03-05 DIAGNOSIS — I5032 Chronic diastolic (congestive) heart failure: Secondary | ICD-10-CM

## 2020-03-05 DIAGNOSIS — R600 Localized edema: Secondary | ICD-10-CM

## 2020-03-05 DIAGNOSIS — I5033 Acute on chronic diastolic (congestive) heart failure: Secondary | ICD-10-CM

## 2020-03-05 DIAGNOSIS — I10 Essential (primary) hypertension: Secondary | ICD-10-CM

## 2020-03-05 MED ORDER — METOPROLOL SUCCINATE ER 25 MG PO TB24: 25.0000 mg | ORAL_TABLET | Freq: Every day | ORAL | 2 refills | Status: DC

## 2020-03-06 NOTE — Progress Notes (Signed)
@Patient  ID: Bridget Lamb, female    DOB: 01/13/1956, 64 y.o.   MRN: [id number]  Chief Complaint  Patient presents with  . Hospitalization Follow-up    Was in the hospital late April for SOB. States her breathing has been doing well since discharge. Has not noticed any O2 sats in the 80s.     Referring provider: [name]  HPI:  64 year old female never smoker followed in our office for asthma  PMH: Hypertension, dyslipidemia, hypothyroidism, obesity, diastolic heart failure Smoker/ Smoking History: Never smoker Maintenance:   Pt of: Dr. Ander Slade  03/07/2020  - Visit   64 year old female never smoker followed in our office for asthma as well as dyspnea on exertion.  She was last seen in office for evaluation of snoring in December/2019.  She was recently hospitalized on 02/20/2020 for acute pulmonary edema.  She was discharged on 02/26/2020.  An excerpt from discharge summaries listed below:   Discharge Diagnoses:  Acute respiratory failure with hypoxia and hypercarbia-multifactorial including pulmonary edema, diastolic CHF, OSA and possible OHS. Patient with diagnosis of OSA but not on CPAP. Initial ABG on admission with acute respiratory acidosis with hypercarbia.  Ambulated on room air and maintained appropriate saturation but she continued to have nocturnal desaturation likely due to sleep apnea. -Discharged on 3 L by nasal cannula to be worn at night. -Encourage lifestyle change to lose weight. -Encouraged follow-up at sleep clinic.  Acute on chronic diastolic CHF/pulmonary edema/bilateral pleural effusion: BNP elevated. CTA chest reveals pulmonary edema. Echo with EF of 60 to 65%, G2-DD and moderate LAE. CXR on 5/2 with improved aeration. Responded to IV Lasix, and transitioned to p.o. torsemide and continue to diurese well.  Net -13 L during this hospitalization.  Renal function stable. Mild alkalosis. -Discharged on p.o. torsemide 20 mg twice daily with  Kdur 20 mEq twice daily. -Counseled on sodium and fluid restriction as well as daily weight.  Uncontrolled hypertension: Normotensive. On lisinopril and nifedipine at home. Nifedipine might not be ideal in the setting of CHF.  -Continue home medications-defer changes to her cardiologist -Diuretics as above  OSA not on CPAP-could not tolerate CPAP mask. -Discharged on 3 L at night.  CKD-3B/azotemia: Baseline Cr 1.3-1.5> 1.24 (admit)> 1.60> 1.3>1.28> 1.24 -Recheck renal function at follow-up.  Metabolic alkalosis: Likely due to diuretics. Could be compensatory to CO2 retention -Recheck at follow-up.  Hypokalemia: Replenished prior to discharge. -Discharged on K-Dur 20 milliequivalents twice daily -Recheck at follow-up.  Iron deficiency anemia: Baseline Hgb 8-9> 10.3 (admit)>> 9.0>8.9. Denies melena or hematochezia. Iron sat 9 9%. TIBC 288. Ferritin 80. -IV Feraheme on 4/30and 5/3. -Continueferrous sulfate with bowel regimen -Recheck CBC at follow-up.  Hypothyroidism: TSH within normal. -Continue home Synthroid  Morbid obesity:Body mass index is 45.45 kg/m. -Encouraged lifestyle change to lose weight.  History of endometrial cancer s/p TRH/BSO -Outpatient follow-up with Gyn as needed.   Patient reporting to our office today that she has been doing well since being discharged from the hospital.  She has been working with primary care for management of her electrolyte she has had episodes of hyperkalemia.  She has been getting blood work from primary care.  She reports that she never actually received a CPAP for outpatient management.  She was concerned that she would not tolerate it given the fact that she struggled to tolerate it during her split-night sleep study.  We will discuss this today.  Patient thinks that she needs oxygen at night.  We  will discuss this.  Most recent lab work completed by primary care on 03/06/2020 does show an increasing CO2 level.    Patient reports increased shortness of breath when walking up a hill.  Walk today in office she tolerated without any oxygen desaturations.  Questionaires / Pulmonary Flowsheets:   MMRC: mMRC Dyspnea Scale mMRC Score  03/07/2020 0   Epworth:  Results of the Epworth flowsheet 10/02/2018  Sitting and reading 2  Watching TV 2  Sitting, inactive in a public place (e.g. a theatre or a meeting) 1  As a passenger in a car for an hour without a break 0  Lying down to rest in the afternoon when circumstances permit 2  Sitting and talking to someone 0  Sitting quietly after a lunch without alcohol 1  In a car, while stopped for a few minutes in traffic 0  Total score 8    Tests:   02/20/2020-CTA chest-no evidence of acute PE, patchy groundglass and consolidative opacities throughout aerated lungs bilaterally which may represent edema or infection, consider follow-up chest CT in 6 to 8 weeks after resolution of acute symptomology to ensure that nodularity resolves, moderate bilateral pleural effusions, dilated main pulmonary arteries as can be seen with pulmonary arterial hypertension  02/21/2020-echocardiogram-LV ejection fraction 60 to 65%, elevated left ventricular end-diastolic pressure, grade 2 diastolic dysfunction, right ventricle systolic function is normal, mildly elevated pulmonary systolic pressure  [id number] sleep study-AHI 86.8  FENO:  No results found for: NITRICOXIDE  PFT: No flowsheet data found.  WALK:  SIX MIN WALK 03/07/2020  Supplimental Oxygen during Test? (L/min) No  Tech Comments: Patient was able to tolerate 1 lap at a steady pace. Denied any SOB or chest pain. Did state that she had some hip and leg pain but these are not new pains for her. No O2 needed during or after walk.    Imaging: DG Chest 1 View  Result Date: 02/24/2020 CLINICAL DATA:  shob presented to [facility] ED 4/28 with dyspnea and hypoxia. EXAM: CHEST  1 VIEW COMPARISON:  Chest radiograph  02/21/2020 FINDINGS: Stable cardiomediastinal contours with enlarged heart size. Improved aeration in the bilateral lung bases. There is persistent diffuse bilateral mild interstitial opacities. Small bilateral pleural effusions. No pneumothorax. No acute finding in the visualized skeleton. IMPRESSION: Improved aeration in the bilateral lung bases. Persistent diffuse bilateral interstitial opacities with small bilateral pleural effusions. Electronically Signed   By: Audie Pinto M.D.   On: 02/24/2020 12:44   CT Angio Chest PE W and/or Wo Contrast  Result Date: 02/20/2020 CLINICAL DATA:  Shortness of breath and chest pain. Decreased oxygen saturation. EXAM: CT ANGIOGRAPHY CHEST WITH CONTRAST TECHNIQUE: Multidetector CT imaging of the chest was performed using the standard protocol during bolus administration of intravenous contrast. Multiplanar CT image reconstructions and MIPs were obtained to evaluate the vascular anatomy. CONTRAST:  20mL OMNIPAQUE IOHEXOL 350 MG/ML SOLN COMPARISON:  CT abdomen pelvis 10/16/2019 FINDINGS: Cardiovascular: Heart is enlarged. Aortic atherosclerosis. No pericardial effusion. Adequate opacification of the pulmonary arterial system. Dilated main pulmonary artery as can be seen with pulmonary arterial hypertension. Motion artifact limits evaluation particularly within the lower lobes. No intraluminal filling defect identified to suggest acute pulmonary embolus. Mediastinum/Nodes: No enlarged axillary adenopathy. There is a prominent 10 mm right paratracheal lymph node. Additional prominent subcentimeter prevascular and subcarinal lymph nodes are demonstrated. Normal appearance of the esophagus. Lungs/Pleura: Expiratory phase imaging. Subpleural and scattered areas of consolidation predominantly involving the lower lobes bilaterally. Patchy ground-glass opacities throughout  the aerated lungs. Peripheral consolidation within the right upper lobe (image 52; series 6). Additionally  there are smaller scattered nodules and focal areas of consolidation involving the upper lobes bilaterally. Moderate bilateral pleural effusions. No pneumothorax. Upper Abdomen: No acute process. Musculoskeletal: No aggressive or acute appearing osseous lesions. Thoracic spine degenerative changes. Review of the MIP images confirms the above findings. IMPRESSION: 1. No evidence for acute pulmonary embolus. 2. Patchy ground-glass and consolidative opacities throughout the aerated lungs bilaterally which may represent edema or infection. Additionally, there are scattered areas of consolidation within the lungs bilaterally which may represent atelectasis or infection. Consider follow-up chest CT in 6-8 weeks after resolution of acute symptomatology to ensure the nodularity resolves. 3. Moderate bilateral pleural effusions. 4. Prominent mediastinal lymph nodes, likely reactive. 5. Dilated main pulmonary artery as can be seen with pulmonary arterial hypertension. 6. Aortic atherosclerosis. Aortic Atherosclerosis (ICD10-I70.0). Electronically Signed   By: Lovey Newcomer M.D.   On: 02/20/2020 16:50   DG Chest Port 1 View  Result Date: 02/21/2020 CLINICAL DATA:  Respiratory failure. EXAM: PORTABLE CHEST 1 VIEW COMPARISON:  February 20, 2020. FINDINGS: Stable cardiomediastinal silhouette with mild central pulmonary vascular congestion. No pneumothorax is noted. Increased perihilar and basilar opacities are noted concerning for edema or infiltrates with mild bilateral pleural effusions. Bony thorax is unremarkable. IMPRESSION: Stable mild central pulmonary vascular congestion. Increased perihilar and basilar opacities are noted concerning for edema or infiltrates with mild bilateral pleural effusions. Electronically Signed   By: Marijo Conception M.D.   On: 02/21/2020 08:25   DG Chest Port 1 View  Result Date: 02/20/2020 CLINICAL DATA:  Shortness of breath and chest pressure EXAM: PORTABLE CHEST 1 VIEW COMPARISON:  October 22, 2019 FINDINGS: There is ill-defined airspace opacity in the lateral left base. The lungs otherwise are clear. Heart is upper normal in size with pulmonary vascularity normal. No adenopathy. No bone lesions. IMPRESSION: Ill-defined opacity lateral left base, concerning for pneumonia. Lungs elsewhere clear. Stable cardiac silhouette. No adenopathy demonstrable. Electronically Signed   By: Lowella Grip III M.D.   On: 02/20/2020 12:43   ECHOCARDIOGRAM COMPLETE  Result Date: 02/21/2020    ECHOCARDIOGRAM REPORT   Patient Name:   JERLYN LUZADDER Davie Date of Exam: 02/21/2020 Medical Rec #:  [id number]           Height:       64.0 in Accession #:    [id number]          Weight:       269.6 lb Date of Birth:  1956-08-02           BSA:          2.221 m Patient Age:    14 years            BP:           158/68 mmHg Patient Gender: F                   HR:           73 bpm. Exam Location:  Inpatient Procedure: 2D Echo, Cardiac Doppler, Color Doppler and Intracardiac            Opacification Agent Indications:    Acute Pulmonary Edema [id number]  History:        Patient has prior history of Echocardiogram examinations, most                 recent 08/30/2018. CHF; Risk Factors:Hypertension  and                 Dyslipidemia. Cancer. CKD.  Sonographer:    Jonelle Sidle Dance Referring Phys: [id number] RAHUL P DESAI IMPRESSIONS  1. Left ventricular ejection fraction, by estimation, is 60 to 65%. The left ventricle has normal function. The left ventricle has no regional wall motion abnormalities. There is mild concentric left ventricular hypertrophy. Left ventricular diastolic parameters are consistent with Grade II diastolic dysfunction (pseudonormalization). Elevated left ventricular end-diastolic pressure.  2. Right ventricular systolic function is normal. The right ventricular size is normal. There is mildly elevated pulmonary artery systolic pressure.  3. Left atrial size was moderately dilated.  4. The mitral valve is normal in  structure. Trivial mitral valve regurgitation. No evidence of mitral stenosis.  5. The aortic valve was not well visualized. Aortic valve regurgitation is not visualized. No aortic stenosis is present.  6. The inferior vena cava is dilated in size with <50% respiratory variability, suggesting right atrial pressure of 15 mmHg. FINDINGS  Left Ventricle: Left ventricular ejection fraction, by estimation, is 60 to 65%. The left ventricle has normal function. The left ventricle has no regional wall motion abnormalities. Definity contrast agent was given IV to delineate the left ventricular  endocardial borders. The left ventricular internal cavity size was normal in size. There is mild concentric left ventricular hypertrophy. Left ventricular diastolic parameters are consistent with Grade II diastolic dysfunction (pseudonormalization). Elevated left ventricular end-diastolic pressure. Right Ventricle: The right ventricular size is normal. No increase in right ventricular wall thickness. Right ventricular systolic function is normal. There is mildly elevated pulmonary artery systolic pressure. The tricuspid regurgitant velocity is 2.47  m/s, and with an assumed right atrial pressure of 15 mmHg, the estimated right ventricular systolic pressure is [id number] mmHg. Left Atrium: Left atrial size was moderately dilated. Right Atrium: Right atrial size was normal in size. Pericardium: Trivial pericardial effusion is present. Mitral Valve: The mitral valve is normal in structure. Normal mobility of the mitral valve leaflets. Trivial mitral valve regurgitation. No evidence of mitral valve stenosis. Tricuspid Valve: The tricuspid valve is normal in structure. Tricuspid valve regurgitation is mild . No evidence of tricuspid stenosis. Aortic Valve: The aortic valve was not well visualized. Aortic valve regurgitation is not visualized. No aortic stenosis is present. Pulmonic Valve: The pulmonic valve was normal in structure. Pulmonic  valve regurgitation is trivial. No evidence of pulmonic stenosis. Aorta: The aortic root is normal in size and structure. Venous: The inferior vena cava is dilated in size with less than 50% respiratory variability, suggesting right atrial pressure of 15 mmHg. IAS/Shunts: No atrial level shunt detected by color flow Doppler.  LEFT VENTRICLE PLAX 2D LVIDd:         4.96 cm  Diastology LVIDs:         3.57 cm  LV e' lateral:   5.77 cm/s LV PW:         1.30 cm  LV E/e' lateral: 23.7 LV IVS:        1.09 cm  LV e' medial:    6.09 cm/s LVOT diam:     2.00 cm  LV E/e' medial:  22.5 LV SV:         92 LV SV Index:   41 LVOT Area:     3.14 cm  RIGHT VENTRICLE             IVC RV Basal diam:  2.59 cm     IVC diam:  2.72 cm RV S prime:     12.20 cm/s TAPSE (M-mode): 2.3 cm LEFT ATRIUM             Index       RIGHT ATRIUM           Index LA diam:        4.80 cm 2.16 cm/m  RA Area:     18.20 cm LA Vol (A2C):   77.2 ml 34.76 ml/m RA Volume:   44.70 ml  20.12 ml/m LA Vol (A4C):   69.6 ml 31.33 ml/m LA Biplane Vol: 73.9 ml 33.27 ml/m  AORTIC VALVE LVOT Vmax:   125.00 cm/s LVOT Vmean:  82.700 cm/s LVOT VTI:    0.293 m  AORTA Ao Root diam: 2.90 cm Ao Asc diam:  3.30 cm MITRAL VALVE                TRICUSPID VALVE MV Area (PHT): 3.72 cm     TR Peak grad:   24.4 mmHg MV Decel Time: 204 msec     TR Vmax:        247.00 cm/s MV E velocity: 137.00 cm/s MV A velocity: 133.00 cm/s  SHUNTS MV E/A ratio:  1.03         Systemic VTI:  0.29 m                             Systemic Diam: 2.00 cm Skeet Latch MD Electronically signed by Skeet Latch MD Signature Date/Time: 02/21/2020/1:32:25 PM    Final     Lab Results:  CBC    Component Value Date/Time   WBC 7.2 02/25/2020 0338   RBC 3.11 (L) 02/25/2020 0338   HGB 8.9 (L) 02/25/2020 0338   HGB 10.7 (L) 01/27/2011 1329   HCT 30.4 (L) 02/25/2020 0338   HCT 33.2 (L) 01/27/2011 1329   PLT 175 02/25/2020 0338   PLT 227 01/27/2011 1329   MCV 97.7 02/25/2020 0338   MCV 93.3  01/27/2011 1329   MCH 28.6 02/25/2020 0338   MCHC 29.3 (L) 02/25/2020 0338   RDW 15.0 02/25/2020 0338   RDW 13.2 01/27/2011 1329   LYMPHSABS 1.3 02/20/2020 1253   LYMPHSABS 1.7 01/27/2011 1329   MONOABS 0.6 02/20/2020 1253   MONOABS 0.5 01/27/2011 1329   EOSABS 0.1 02/20/2020 1253   EOSABS 0.2 01/27/2011 1329   BASOSABS 0.1 02/20/2020 1253   BASOSABS 0.0 01/27/2011 1329    BMET    Component Value Date/Time   NA 138 02/26/2020 0246   NA 143 02/06/2019 1614   K 3.1 (L) 02/26/2020 0246   CL 90 (L) 02/26/2020 0246   CO2 40 (H) 02/26/2020 0246   GLUCOSE 107 (H) 02/26/2020 0246   BUN 26 (H) 02/26/2020 0246   BUN 32 (H) 02/06/2019 1614   CREATININE 1.24 (H) 02/26/2020 0246   CREATININE 0.93 01/31/2012 0909   CALCIUM 9.3 02/26/2020 0246   GFRNONAA 46 (L) 02/26/2020 0246   GFRAA 53 (L) 02/26/2020 0246    BNP    Component Value Date/Time   BNP 562.6 (H) 02/20/2020 1253    ProBNP No results found for: PROBNP  Specialty Problems      Pulmonary Problems   Acute respiratory failure with hypoxia and hypercapnia (HCC)   Asthma, chronic, unspecified asthma severity, with acute exacerbation   Acute pulmonary edema (HCC)   Dyspnea   OSA (obstructive sleep apnea)      No  Known Allergies  Immunization History  Administered Date(s) Administered  . Influenza,inj,Quad PF,6+ Mos 06/26/2019  . Influenza,inj,Quad PF,6-35 Mos 06/26/2019  . Moderna SARS-COVID-2 Vaccination 02/11/2020  . Pneumococcal Polysaccharide-23 08/05/2015  . Pneumococcal-Unspecified 08/05/2015  . Tdap 01/22/2013, 01/12/2016  . Zoster Recombinat (Shingrix) 02/18/2018, 05/30/2018    Past Medical History:  Diagnosis Date  . Allergic rhinitis   . Anemia    hematology, Dr. Julien Nordmann, last 01/2011  . Asthma   . CHF (congestive heart failure) ([place]) 2004  . Chronic kidney disease    protein in urine  . Complication of anesthesia   . Dyslipidemia   . Dyspnea    on excertion  . Endometrial cancer ([place])   .  H/O echocardiogram 01/07/04   mild LVH, nl LV systolic function, EF 123456, left atrial enlargement, mildly thickened aortic and mitral valves  . History of thyroid cancer   . Hypertension   . Incarcerated ventral hernia 10/07/2013  . Obesity, Class III, BMI 40-49.9 (morbid obesity) ([place]) 10/09/2013  . Osteopenia   . PONV (postoperative nausea and vomiting)   . Umbilical hernia   . Unspecified essential hypertension 10/09/2013  . Unspecified hypothyroidism 10/09/2013    Tobacco History: Social History   Tobacco Use  Smoking Status Never Smoker  Smokeless Tobacco Never Used   Counseling given: Not Answered   Continue to not smoke  Outpatient Encounter Medications as of 03/07/2020  Medication Sig  . albuterol (PROVENTIL HFA;VENTOLIN HFA) 108 (90 Base) MCG/ACT inhaler Inhale 2 puffs into the lungs every 4 (four) hours as needed for wheezing or shortness of breath.  Marland Kitchen aspirin EC 81 MG tablet Take 81 mg by mouth daily.  . Calcium Carbonate-Vitamin D (CALCIUM 600+D PO) Take 1 tablet by mouth daily.  . cetirizine (ZYRTEC) 10 MG tablet Take 10 mg by mouth daily.  . DULERA 100-5 MCG/ACT AERO SMARTSIG:2 Puff(s) By Mouth Twice Daily  . FeFum-FePoly-FA-B Cmp-C-Biot (INTEGRA PLUS) CAPS Take 1 capsule by mouth daily.  Marland Kitchen levothyroxine (SYNTHROID, LEVOTHROID) 150 MCG tablet Take 150 mcg by mouth daily before breakfast.   . lisinopril (PRINIVIL,ZESTRIL) 20 MG tablet Take 20 mg by mouth daily.  . metoprolol succinate (TOPROL-XL) 25 MG 24 hr tablet Take 1 tablet (25 mg total) by mouth daily. Take with or immediately following a meal.  . potassium chloride SA (KLOR-CON) 20 MEQ tablet Take 1 tablet (20 mEq total) by mouth 2 (two) times daily.  . rosuvastatin (CRESTOR) 10 MG tablet Take 10 mg by mouth at bedtime.  . senna-docusate (SENOKOT-S) 8.6-50 MG tablet Take 1 tablet by mouth 2 (two) times daily as needed for moderate constipation.  . torsemide (DEMADEX) 20 MG tablet Take 1 tablet (20 mg total) by  mouth 2 (two) times daily.   No facility-administered encounter medications on file as of 03/07/2020.     Review of Systems  Review of Systems  Constitutional: Negative for activity change, fatigue and fever.  HENT: Negative for sinus pressure, sinus pain and sore throat.   Respiratory: Positive for shortness of breath. Negative for cough and wheezing.   Cardiovascular: Positive for leg swelling. Negative for chest pain and palpitations.  Gastrointestinal: Negative for diarrhea, nausea and vomiting.  Musculoskeletal: Negative for arthralgias.  Neurological: Negative for dizziness.  Psychiatric/Behavioral: Negative for sleep disturbance. The patient is not nervous/anxious.      Physical Exam  BP 124/82 (BP Location: Left Arm, Patient Position: Sitting, Cuff Size: Large)   Pulse 71   Temp 97.9 F (36.6 C) (Temporal)  Ht 5\' 4"  (1.626 m)   Wt 251 lb 9.6 oz (114.1 kg)   LMP 10/22/2011   SpO2 97% Comment: on RA  BMI 43.19 kg/m   Wt Readings from Last 5 Encounters:  03/07/20 251 lb 9.6 oz (114.1 kg)  03/05/20 250 lb 9.6 oz (113.7 kg)  02/24/20 264 lb 12.4 oz (120.1 kg)  11/15/19 261 lb (118.4 kg)  10/25/19 254 lb 3.2 oz (115.3 kg)    BMI Readings from Last 5 Encounters:  03/07/20 43.19 kg/m  03/05/20 43.02 kg/m  02/24/20 45.45 kg/m  11/15/19 44.80 kg/m  10/25/19 43.63 kg/m     Physical Exam Vitals and nursing note reviewed.  Constitutional:      General: She is not in acute distress.    Appearance: Normal appearance. She is obese.  HENT:     Head: Normocephalic and atraumatic.     Right Ear: External ear normal.     Left Ear: External ear normal.     Nose: Nose normal. No congestion.     Mouth/Throat:     Mouth: Mucous membranes are moist.     Pharynx: Oropharynx is clear.  Eyes:     Pupils: Pupils are equal, round, and reactive to light.  Cardiovascular:     Rate and Rhythm: Normal rate and regular rhythm.     Pulses: Normal pulses.     Heart sounds:  Normal heart sounds. No murmur.  Pulmonary:     Effort: Pulmonary effort is normal. No respiratory distress.     Breath sounds: Normal breath sounds. No decreased air movement. No decreased breath sounds, wheezing or rales.  Musculoskeletal:     Cervical back: Normal range of motion.     Right lower leg: Edema (2+) present.     Left lower leg: Edema (2+) present.  Skin:    General: Skin is warm and dry.     Capillary Refill: Capillary refill takes less than 2 seconds.  Neurological:     General: No focal deficit present.     Mental Status: She is alert and oriented to person, place, and time. Mental status is at baseline.     Gait: Gait normal.  Psychiatric:        Mood and Affect: Mood normal.        Behavior: Behavior normal.        Thought Content: Thought content normal.        Judgment: Judgment normal.       Assessment & Plan:   Chronic heart failure with preserved ejection fraction (HCC) Plan: Continue follow-up with primary care Continue follow-up with cardiology Continue torsemide Continue to weigh yourself daily  Chronic diastolic CHF (congestive heart failure) ([place]) Followed by Dr. Einar Gip  Plan: Continue torsemide Continue to weigh yourself daily Notify primary care and cardiology if you are having fluid retention or increased lower extremity swelling  OSA (obstructive sleep apnea) Patient with severe obstructive sleep apnea Patient never treated in the outpatient setting with CPAP before Patient struggled to tolerate CPAP during split-night sleep study Patient was treated with BiPAP when most recently hospitalized High suspicion for obesity hypoventilation syndrome 03/06/2020 primary care labs show a CO2 level of 35  Discussion: Reviewed with patient the pathophysiology of obstructive sleep apnea.  Emphasized the importance of receiving treatment.  Also reviewed with patient is a likelihood she may have obesity hypoventilation syndrome and will have to  monitor CO2 levels as well as her symptoms clinically when outside the hospital.  She  agrees to start CPAP therapy in the outpatient setting.  Plan: Start CPAP therapy APAP setting 5-15 Mask of choice DME: [employer] We will send community message to [employer] to ensure patient gets close follow-up for set up  Acute respiratory failure with hypoxia and hypercapnia (HCC) Required oxygen when in the hospital Patient with untreated severe obstructive sleep apnea  Plan: We will have to monitor outpatient CO2 levels and have low yield for ordering arterial blood gas to monitor for hypercarbia if patient becomes symptomatic Chest x-ray today We will start CPAP therapy We will need to order 0N0 when obstructive sleep apnea is treated with CPAP  Acute pulmonary edema (HCC) Plan: X-ray today  Dyspnea Chronic shortness of breath with physical exertion Patient carries clinical diagnosis of asthma Shortness of breath is likely multifactorial given untreated severe obstructive sleep apnea, suspected obesity hypoventilation syndrome, diastolic heart failure  Plan: We will order pulmonary function testing Chest x-ray today    Abnormal findings on diagnostic imaging of lung Plan: We will order chest x-ray today May need to consider repeat CT chest in 2 to 3 months based off of x-ray imaging  Obesity, Class III, BMI 40-49.9 (morbid obesity) ([place]) Likely a contributing factor to patient's shortness of breath as well as obstructive sleep apnea  Plan: Emphasized importance of reducing BMI and remaining physically active    Return in about 2 months (around 05/07/2020), or if symptoms worsen or fail to improve, for Follow up with Dr. Ander Slade, Follow up for PFT.   Lauraine Rinne, NP 03/07/2020   This appointment required 42 minutes of patient care (this includes precharting, chart review, review of results, face-to-face care, etc.).

## 2020-03-07 ENCOUNTER — Encounter: Payer: Self-pay | Admitting: Pulmonary Disease

## 2020-03-07 ENCOUNTER — Other Ambulatory Visit: Payer: Self-pay

## 2020-03-07 ENCOUNTER — Ambulatory Visit: Payer: BC Managed Care – PPO | Admitting: Pulmonary Disease

## 2020-03-07 VITALS — BP 124/82 | HR 71 | Temp 97.9°F | Ht 64.0 in | Wt 251.6 lb

## 2020-03-07 DIAGNOSIS — R918 Other nonspecific abnormal finding of lung field: Secondary | ICD-10-CM | POA: Insufficient documentation

## 2020-03-07 DIAGNOSIS — J9601 Acute respiratory failure with hypoxia: Secondary | ICD-10-CM

## 2020-03-07 DIAGNOSIS — G4733 Obstructive sleep apnea (adult) (pediatric): Secondary | ICD-10-CM | POA: Diagnosis not present

## 2020-03-07 DIAGNOSIS — J810 Acute pulmonary edema: Secondary | ICD-10-CM

## 2020-03-07 DIAGNOSIS — I5032 Chronic diastolic (congestive) heart failure: Secondary | ICD-10-CM

## 2020-03-07 DIAGNOSIS — J9602 Acute respiratory failure with hypercapnia: Secondary | ICD-10-CM

## 2020-03-07 DIAGNOSIS — R0602 Shortness of breath: Secondary | ICD-10-CM | POA: Diagnosis not present

## 2020-03-07 NOTE — Assessment & Plan Note (Signed)
Plan: We will order chest x-ray today May need to consider repeat CT chest in 2 to 3 months based off of x-ray imaging

## 2020-03-07 NOTE — Assessment & Plan Note (Signed)
Required oxygen when in the hospital Patient with untreated severe obstructive sleep apnea  Plan: We will have to monitor outpatient CO2 levels and have low yield for ordering arterial blood gas to monitor for hypercarbia if patient becomes symptomatic Chest x-ray today We will start CPAP therapy We will need to order 0N0 when obstructive sleep apnea is treated with CPAP

## 2020-03-07 NOTE — Assessment & Plan Note (Signed)
Chronic shortness of breath with physical exertion Patient carries clinical diagnosis of asthma Shortness of breath is likely multifactorial given untreated severe obstructive sleep apnea, suspected obesity hypoventilation syndrome, diastolic heart failure  Plan: We will order pulmonary function testing Chest x-ray today

## 2020-03-07 NOTE — Assessment & Plan Note (Signed)
Followed by Dr. Einar Gip  Plan: Continue torsemide Continue to weigh yourself daily Notify primary care and cardiology if you are having fluid retention or increased lower extremity swelling

## 2020-03-07 NOTE — Assessment & Plan Note (Signed)
Plan: X-ray today 

## 2020-03-07 NOTE — Assessment & Plan Note (Signed)
Plan: Continue follow-up with primary care Continue follow-up with cardiology Continue torsemide Continue to weigh yourself daily

## 2020-03-07 NOTE — Patient Instructions (Addendum)
You were seen today by Lauraine Rinne, NP  for:   1. OSA (obstructive sleep apnea)  CPAP new start APAP setting 5-15 Mask of choice Supplies DME: [employer]  We recommend that you start using your CPAP daily >>>Keep up the hard work using your device >>> Goal should be wearing this for the entire night that you are sleeping, at least 4 to 6 hours  Remember:  . Do not drive or operate heavy machinery if tired or drowsy.  . Please notify the supply company and office if you are unable to use your device regularly due to missing supplies or machine being broken.  . Work on maintaining a healthy weight and following your recommended nutrition plan  . Maintain proper daily exercise and movement  . Maintaining proper use of your device can also help improve management of other chronic illnesses such as: Blood pressure, blood sugars, and weight management.   BiPAP/ CPAP Cleaning:  >>>Clean weekly, with Dawn soap, and bottle brush.  Set up to air dry. >>> Wipe mask out daily with wet wipe or towelette  2. Shortness of breath  - Pulmonary function test; Future  Walk today in office stable  3. Acute respiratory failure with hypoxia and hypercapnia (HCC)  We will start CPAP therapy  Once CPAP therapy is maintained we will need to order overnight oximetry on CPAP to investigate oxygen needs at night  Monitor for symptoms of hypercarbia such as: Fatigue, lightheadedness, increased daytime somnolence  4. Abnormal findings on diagnostic imaging of lung  Chest x-ray today  May need to consider repeat CT of chest in 2 months  5. Chronic heart failure with preserved ejection fraction (HCC) 6. Chronic diastolic CHF (congestive heart failure) (HCC)  Keep follow-up with Dr. Einar Gip and primary care   We recommend today:  Orders Placed This Encounter  Procedures  . DG Chest 2 View    Standing Status:   Future    Standing Expiration Date:   05/07/2021    Order Specific Question:   Reason for  Exam (SYMPTOM  OR DIAGNOSIS REQUIRED)    Answer:   follow up from CT    Order Specific Question:   Preferred imaging location?    Answer:   Internal    Order Specific Question:   Radiology Contrast Protocol - do NOT remove file path    Answer:   [email or web address]  . Pulmonary function test    Standing Status:   Future    Standing Expiration Date:   03/07/2021    Order Specific Question:   Where should this test be performed?    Answer:   [facility]    Order Specific Question:   Full PFT: includes the following: basic spirometry, spirometry pre & post bronchodilator, diffusion capacity (DLCO), lung volumes    Answer:   Full PFT   Orders Placed This Encounter  Procedures  . DG Chest 2 View  . Pulmonary function test   No orders of the defined types were placed in this encounter.   Follow Up:    Return in about 2 months (around 05/07/2020), or if symptoms worsen or fail to improve, for Follow up with Dr. Ander Slade, Follow up for PFT.   Please do your part to reduce the spread of COVID-19:      Reduce your risk of any infection  and COVID19 by using the similar precautions used for avoiding the common cold or flu:  Marland Kitchen Wash your hands often with soap and  warm water for at least 20 seconds.  If soap and water are not readily available, use an alcohol-based hand sanitizer with at least 60% alcohol.  . If coughing or sneezing, cover your mouth and nose by coughing or sneezing into the elbow areas of your shirt or coat, into a tissue or into your sleeve (not your hands). Langley Gauss A MASK when in public  . Avoid shaking hands with others and consider head nods or verbal greetings only. . Avoid touching your eyes, nose, or mouth with unwashed hands.  . Avoid close contact with people who are sick. . Avoid places or events with large numbers of people in one location, like concerts or sporting events. . If you have some symptoms but not all  symptoms, continue to monitor at home and seek medical attention if your symptoms worsen. . If you are having a medical emergency, call 911.   [place] / e-Visit: [email or web address]         [facility]: [facility]: [id number]                   [facility]: [id number]     It is flu season:   >>> Best ways to protect herself from the flu: Receive the yearly flu vaccine, practice good hand hygiene washing with soap and also using hand sanitizer when available, eat a nutritious meals, get adequate rest, hydrate appropriately   Please contact the office if your symptoms worsen or you have concerns that you are not improving.   Thank you for choosing [facility] for your healthcare, and for allowing [place] to partner with you on your healthcare journey. I am thankful to be able to provide care to you today.   Wyn Quaker FNP-C    Living With Sleep Apnea Sleep apnea is a condition in which breathing pauses or becomes shallow during sleep. Sleep apnea is most commonly caused by a collapsed or blocked airway. People with sleep apnea snore loudly and have times when they gasp and stop breathing for 10 seconds or more during sleep. This happens over and over during the night. This disrupts your sleep and keeps your body from getting the rest that it needs, which can cause tiredness and lack of energy (fatigue) during the day. The breaks in breathing also interrupt the deep sleep that you need to feel rested. Even if you do not completely wake up from the gaps in breathing, your sleep may not be restful. You may also have a headache in the morning and low energy during the day, and you may feel anxious or depressed. How can sleep apnea affect me? Sleep apnea increases your chances of extreme tiredness during the day (daytime fatigue).  It can also increase your risk for health conditions, such as:  Heart attack.  Stroke.  Diabetes.  Heart failure.  Irregular heartbeat.  High blood pressure. If you have daytime fatigue as a result of sleep apnea, you may be more likely to:  Perform poorly at school or work.  Fall asleep while driving.  Have difficulty with attention.  Develop depression or anxiety.  Become severely overweight (obese).  Have sexual dysfunction. What actions can I take to manage sleep apnea? Sleep apnea treatment   If you were given a device to open your airway while you sleep, use it only as told by your health care provider. You may be  given: ? An oral appliance. This is a custom-made mouthpiece that shifts your lower jaw forward. ? A continuous positive airway pressure (CPAP) device. This device blows air through a mask when you breathe out (exhale). ? A nasal expiratory positive airway pressure (EPAP) device. This device has valves that you put into each nostril. ? A bi-level positive airway pressure (BPAP) device. This device blows air through a mask when you breathe in (inhale) and breathe out (exhale).  You may need surgery if other treatments do not work for you. Sleep habits  Go to sleep and wake up at the same time every day. This helps set your internal clock (circadian rhythm) for sleeping. ? If you stay up later than usual, such as on weekends, try to get up in the morning within 2 hours of your normal wake time.  Try to get at least 7-9 hours of sleep each night.  Stop computer, tablet, and mobile phone use a few hours before bedtime.  Do not take long naps during the day. If you nap, limit it to 30 minutes.  Have a relaxing bedtime routine. Reading or listening to music may relax you and help you sleep.  Use your bedroom only for sleep. ? Keep your television and computer out of your bedroom. ? Keep your bedroom cool, dark, and quiet. ? Use a supportive mattress and  pillows.  Follow your health care provider's instructions for other changes to sleep habits. Nutrition  Do not eat heavy meals in the evening.  Do not have caffeine in the later part of the day. The effects of caffeine can last for more than 5 hours.  Follow your health care provider's or dietitian's instructions for any diet changes. Lifestyle      Do not drink alcohol before bedtime. Alcohol can cause you to fall asleep at first, but then it can cause you to wake up in the middle of the night and have trouble getting back to sleep.  Do not use any products that contain nicotine or tobacco, such as cigarettes and e-cigarettes. If you need help quitting, ask your health care provider. Medicines  Take over-the-counter and prescription medicines only as told by your health care provider.  Do not use over-the-counter sleep medicine. You can become dependent on this medicine, and it can make sleep apnea worse.  Do not use medicines, such as sedatives and narcotics, unless told by your health care provider. Activity  Exercise on most days, but avoid exercising in the evening. Exercising near bedtime can interfere with sleeping.  If possible, spend time outside every day. Natural light helps regulate your circadian rhythm. General information  Lose weight if you need to, and maintain a healthy weight.  Keep all follow-up visits as told by your health care provider. This is important.  If you are having surgery, make sure to tell your health care provider that you have sleep apnea. You may need to bring your device with you. Where to find more information Learn more about sleep apnea and daytime fatigue from:  American Sleep Association: sleepassociation.[place]: sleepfoundation.org  National Heart, Lung, and Blood Institute: https://www.hartman-hill.biz/ Summary  Sleep apnea can cause daytime fatigue and other serious health conditions.  Both sleep apnea and daytime  fatigue can be bad for your health and well-being.  You may need to wear a device while sleeping to help keep your airway open.  If you are having surgery, make sure to tell your health care provider  that you have sleep apnea. You may need to bring your device with you.  Making changes to sleep habits, diet, lifestyle, and activity can help you manage sleep apnea. This information is not intended to replace advice given to you by your health care provider. Make sure you discuss any questions you have with your health care provider. Document Revised: 02/02/2019 Document Reviewed: 01/05/2018 Elsevier Patient Education  [place].

## 2020-03-07 NOTE — Assessment & Plan Note (Signed)
Patient with severe obstructive sleep apnea Patient never treated in the outpatient setting with CPAP before Patient struggled to tolerate CPAP during split-night sleep study Patient was treated with BiPAP when most recently hospitalized High suspicion for obesity hypoventilation syndrome 03/06/2020 primary care labs show a CO2 level of 35  Discussion: Reviewed with patient the pathophysiology of obstructive sleep apnea.  Emphasized the importance of receiving treatment.  Also reviewed with patient is a likelihood she may have obesity hypoventilation syndrome and will have to monitor CO2 levels as well as her symptoms clinically when outside the hospital.  She agrees to start CPAP therapy in the outpatient setting.  Plan: Start CPAP therapy APAP setting 5-15 Mask of choice DME: [employer] We will send community message to [employer] to ensure patient gets close follow-up for set up

## 2020-03-07 NOTE — Assessment & Plan Note (Signed)
Likely a contributing factor to patient's shortness of breath as well as obstructive sleep apnea  Plan: Emphasized importance of reducing BMI and remaining physically active

## 2020-03-10 ENCOUNTER — Ambulatory Visit: Payer: BC Managed Care – PPO

## 2020-03-10 ENCOUNTER — Other Ambulatory Visit: Payer: Self-pay

## 2020-03-10 DIAGNOSIS — I5033 Acute on chronic diastolic (congestive) heart failure: Secondary | ICD-10-CM

## 2020-03-10 DIAGNOSIS — R0600 Dyspnea, unspecified: Secondary | ICD-10-CM

## 2020-03-10 DIAGNOSIS — R0609 Other forms of dyspnea: Secondary | ICD-10-CM

## 2020-03-18 NOTE — Telephone Encounter (Signed)
I called Sonia Baller at [facility] left vm regarding status of order.

## 2020-03-18 NOTE — Telephone Encounter (Signed)
Community message received from [place], I received your message concerning this patient's CPAP. Unfortunately, the person that called the patient on 5/17 never moved it along to the next step in the process. I have completed the order and sent it to scheduling. She should receive a call in the next day or so.    -Will route message to triage so they can let pt know thru [employer] should be contacting them in the next day or so.

## 2020-03-19 ENCOUNTER — Ambulatory Visit: Payer: BC Managed Care – PPO | Admitting: Cardiology

## 2020-03-20 NOTE — Telephone Encounter (Signed)
03/20/2020  Spoke with patient's primary care provider.  Notified that patient is having difficulty getting scheduled for CPAP.  I have contacted Melissa with [employer] [facility] directly today to notify that the scheduled time of 04/08/2020 for patient be set up with CPAP is unacceptable  Patient has severe obstructive sleep apnea, likely nocturnal hypoxemia, potential obesity hypoventilation syndrome and was recently discharged from the hospital.  She needs to be emergently set up with a CPAP within the next 24 to 48 hours.  If this is unable to be completed patient is at high risk for hospitalization.  Lenna Sciara will notify us when patient is contacted and set up.  We will send to PCC's as FYI.  Wyn Quaker, FNP

## 2020-04-16 ENCOUNTER — Other Ambulatory Visit: Payer: Self-pay

## 2020-04-16 ENCOUNTER — Ambulatory Visit
Admission: RE | Admit: 2020-04-16 | Discharge: 2020-04-16 | Disposition: A | Discharge status: Home / Self Care | Payer: BC Managed Care – PPO | Source: Ambulatory Visit | Attending: Internal Medicine | Admitting: Internal Medicine

## 2020-04-16 DIAGNOSIS — Z1231 Encounter for screening mammogram for malignant neoplasm of breast: Secondary | ICD-10-CM

## 2020-04-18 ENCOUNTER — Encounter: Payer: Self-pay | Admitting: Cardiology

## 2020-04-18 ENCOUNTER — Other Ambulatory Visit: Payer: Self-pay

## 2020-04-18 ENCOUNTER — Ambulatory Visit: Payer: BC Managed Care – PPO | Admitting: Cardiology

## 2020-04-18 VITALS — BP 125/73 | HR 80 | Resp 15 | Ht 64.0 in | Wt 250.2 lb

## 2020-04-18 DIAGNOSIS — R600 Localized edema: Secondary | ICD-10-CM

## 2020-04-18 DIAGNOSIS — I10 Essential (primary) hypertension: Secondary | ICD-10-CM

## 2020-04-18 DIAGNOSIS — R0609 Other forms of dyspnea: Secondary | ICD-10-CM

## 2020-04-18 DIAGNOSIS — I5032 Chronic diastolic (congestive) heart failure: Secondary | ICD-10-CM

## 2020-04-18 DIAGNOSIS — R0600 Dyspnea, unspecified: Secondary | ICD-10-CM

## 2020-04-18 DIAGNOSIS — N1832 Chronic kidney disease, stage 3b: Secondary | ICD-10-CM

## 2020-04-18 MED ORDER — TORSEMIDE 20 MG PO TABS: 20.0000 mg | ORAL_TABLET | Freq: Every day | ORAL | 2 refills | Status: DC

## 2020-04-18 MED ORDER — LISINOPRIL 10 MG PO TABS: 10.0000 mg | ORAL_TABLET | Freq: Every day | ORAL | Status: DC

## 2020-04-18 NOTE — Progress Notes (Signed)
Primary Physician/Referring:  [name], PA-C  Patient ID: Bridget Lamb, female    DOB: 1955-12-15, 64 y.o.   MRN: [id number]  Chief Complaint  Patient presents with  . Follow-up    6 week  . Acute Diastolic Heart Failure   HPI:    Bridget Lamb  is a 64 y.o. female with morbid obesity BMI 44, hypertension, restrictive airway disease due to bronchial asthma, hyperlipidemia, HFpEF, OSA and not using CPAP due to feeling claustrophobic is here for 6 month and also post hospital admission follow up. Patient was admitted to ICU at the Hospital on 02/26/2020 for acute respiratory failure due to acute pulmonary edema in the setting of uncontrolled hypertension and diastolic CHF exacerbation.  On her last office visit 4 to 6 weeks ago, had switched her from furosemide to torsemide and also discontinued Procardia and switched her to metoprolol succinate.  She has noticed improvement in dyspnea, leg edema is essentially resolved but has occasional edema, no PND or orthopnea.  No chest pain or palpitation.     Past Medical History:  Diagnosis Date  . Allergic rhinitis   . Anemia    hematology, Dr. Julien Nordmann, last 01/2011  . Asthma   . CHF (congestive heart failure) ([place]) 2004  . Chronic kidney disease    protein in urine  . Complication of anesthesia   . Dyslipidemia   . Dyspnea    on excertion  . Endometrial cancer ([place])   . H/O echocardiogram 01/07/04   mild LVH, nl LV systolic function, EF 17%, left atrial enlargement, mildly thickened aortic and mitral valves  . History of thyroid cancer   . Hypertension   . Incarcerated ventral hernia 10/07/2013  . Obesity, Class III, BMI 40-49.9 (morbid obesity) ([place]) 10/09/2013  . Osteopenia   . PONV (postoperative nausea and vomiting)   . Umbilical hernia   . Unspecified essential hypertension 10/09/2013  . Unspecified hypothyroidism 10/09/2013   Past Surgical History:  Procedure Laterality Date  . BONE MARROW BIOPSY    .  CARDIAC CATHETERIZATION  2005  . CHOLECYSTECTOMY  2003  . COLONOSCOPY  2008  . ROBOTIC ASSISTED TOTAL HYSTERECTOMY WITH BILATERAL SALPINGO OOPHERECTOMY N/A 10/25/2019   Procedure: XI ROBOTIC ASSISTED TOTAL HYSTERECTOMY WITH BILATERAL SALPINGO OOPHORECTOMY;  Surgeon: Lafonda Mosses, MD;  Location: [facility];  Service: Gynecology;  Laterality: N/A;  . SENTINEL NODE BIOPSY N/A 10/25/2019   Procedure: SENTINEL LYMPH NODE BIOPSY;  Surgeon: Lafonda Mosses, MD;  Location: [facility];  Service: Gynecology;  Laterality: N/A;  . THYROID SURGERY     partial thyroidectomy  . TOTAL THYROIDECTOMY  2009  . VENTRAL HERNIA REPAIR N/A 10/07/2013   Procedure: HERNIA REPAIR VENTRAL ADULT;  Surgeon: Gwenyth Ober, MD;  Location: [facility];  Service: General;  Laterality: N/A;   Family History  Problem Relation Age of Onset  . Hypertension Mother   . Pancreatic cancer Mother   . Breast cancer Maternal Aunt   . Lung cancer Father   . Ovarian cancer Neg Hx   . Endometrial cancer Neg Hx   . Colon cancer Neg Hx     Social History   Tobacco Use  . Smoking status: Never Smoker  . Smokeless tobacco: Never Used  Substance Use Topics  . Alcohol use: No   Marital Status: Divorced  ROS  Review of Systems  Cardiovascular: Positive for dyspnea on exertion and leg swelling (improved). Negative for chest pain, claudication and palpitations.  Respiratory: Positive  for snoring (has sleep apnea unable to wear CPAP).   Musculoskeletal: Positive for joint pain (knee and hips chronic).   Objective  Blood pressure 125/73, pulse 80, resp. rate 15, height '5\' 4"'$  (1.626 m), weight 250 lb 3.2 oz (113.5 kg), last menstrual period 10/22/2011, SpO2 98 %.  Vitals with BMI 04/18/2020 03/07/2020 03/05/2020  Height '5\' 4"'$  '5\' 4"'$  '5\' 4"'$   Weight 250 lbs 3 oz 251 lbs 10 oz 250 lbs 10 oz  BMI 42.93 93.71 69.67  Systolic 893 810 175  Diastolic 73 82 59  Pulse 80 71 83     Physical Exam Constitutional:      General: She is not in  acute distress.    Appearance: She is well-developed.  Cardiovascular:     Rate and Rhythm: Normal rate and regular rhythm.     Pulses: Intact distal pulses.     Heart sounds: Murmur heard.  Early systolic murmur is present with a grade of 1/6 at the apex.  No gallop.      Comments: 1-2+ bilateral lower extremity, superficial varicose veins noted. Chronic venostasis pigmentation noted. No JVD  Pulmonary:     Effort: Pulmonary effort is normal. No accessory muscle usage or respiratory distress.     Breath sounds: Normal breath sounds.  Abdominal:     General: Bowel sounds are normal.     Palpations: Abdomen is soft.    Laboratory examination:   Recent Labs    02/24/20 0409 02/25/20 0338 02/26/20 0246  NA 141 142 138  K 3.8 3.8 3.1*  CL 93* 90* 90*  CO2 40* 42* 40*  GLUCOSE 102* 100* 107*  BUN 33* 31* 26*  CREATININE 1.21* 1.28* 1.24*  CALCIUM 9.4 9.2 9.3  GFRNONAA 47* 44* 46*  GFRAA 55* 51* 53*   CrCl cannot be calculated (Patient's most recent lab result is older than the maximum 21 days allowed.).  CMP Latest Ref Rng & Units 02/26/2020 02/25/2020 02/24/2020  Glucose 70 - 99 mg/dL 107(H) 100(H) 102(H)  BUN 8 - 23 mg/dL 26(H) 31(H) 33(H)  Creatinine 0.44 - 1.00 mg/dL 1.24(H) 1.28(H) 1.21(H)  Sodium 135 - 145 mmol/L 138 142 141  Potassium 3.5 - 5.1 mmol/L 3.1(L) 3.8 3.8  Chloride 98 - 111 mmol/L 90(L) 90(L) 93(L)  CO2 22 - 32 mmol/L 40(H) 42(H) 40(H)  Calcium 8.9 - 10.3 mg/dL 9.3 9.2 9.4  Total Protein 6.5 - 8.1 g/dL - - -  Total Bilirubin 0.3 - 1.2 mg/dL - - -  Alkaline Phos 38 - 126 U/L - - -  AST 15 - 41 U/L - - -  ALT 0 - 44 U/L - - -   CBC Latest Ref Rng & Units 02/25/2020 02/23/2020 02/22/2020  WBC 4.0 - 10.5 K/uL 7.2 7.4 9.5  Hemoglobin 12.0 - 15.0 g/dL 8.9(L) 8.8(L) 9.0(L)  Hematocrit 36 - 46 % 30.4(L) 29.9(L) 30.2(L)  Platelets 150 - 400 K/uL 175 173 164   TSH Recent Labs    02/20/20 2149  TSH 1.263   BNP (last 3 results) Recent Labs    02/20/20 1253    BNP 562.6*   Magnesium:  Recent Labs    02/24/20 0409 02/25/20 0338 02/26/20 0246  MG 1.8 1.8 1.9    External labs:   Labs 04/14/2020: Serum glucose 95 mg, BUN 35, creatinine 1.47, EGFR 37 mL.  02/22/20:   Vitamin B12 347.  Iron 26 (L), TIBC 288, Saturation Ratios 9 (L), UIBC 262.    Labs 12/05/2019: Total  cholesterol 158, triglycerides 102, HDL 53, LDL 86.    Medications and allergies  No Known Allergies   Current Outpatient Medications  Medication Instructions  . albuterol (PROVENTIL HFA;VENTOLIN HFA) 108 (90 Base) MCG/ACT inhaler 2 puffs, Inhalation, Every 4 hours PRN  . aspirin EC 81 mg, Oral, Daily  . Calcium Carbonate-Vitamin D (CALCIUM 600+D PO) 1 tablet, Oral, Daily  . cetirizine (ZYRTEC) 10 mg, Oral, Daily  . DULERA 100-5 MCG/ACT AERO SMARTSIG:2 Puff(s) By Mouth Twice Daily  . FeFum-FePoly-FA-B Cmp-C-Biot (INTEGRA PLUS) CAPS 1 capsule, Oral, Daily  . levothyroxine (SYNTHROID) 150 mcg, Oral, Daily before breakfast  . lisinopril (ZESTRIL) 10 mg, Oral, Daily  . metoprolol succinate (TOPROL-XL) 25 mg, Oral, Daily, Take with or immediately following a meal.  . olopatadine (PATANOL) 0.1 % ophthalmic solution INSTILL 1 DROP INTO EACH EYE TWICE DAILY  . rosuvastatin (CRESTOR) 10 mg, Oral, Daily at bedtime  . senna-docusate (SENOKOT-S) 8.6-50 MG tablet 1 tablet, Oral, 2 times daily PRN  . torsemide (DEMADEX) 20 mg, Oral, Daily, Take extra one tab for leg swelling   Radiology:    CT angiography chest with contrast 02/20/2020: 1. No evidence for acute pulmonary embolus. 2. Patchy ground-glass and consolidative opacities throughout the aerated lungs bilaterally which may represent edema or infection. Additionally, there are scattered areas of consolidation within the lungs bilaterally which may represent atelectasis or infection. Consider follow-up chest CT in 6-8 weeks after resolution of acute symptomatology to ensure the nodularity resolves. 3. Moderate bilateral  pleural effusions. 4. Prominent mediastinal lymph nodes, likely reactive. 5. Dilated main pulmonary artery as can be seen with pulmonary arterial hypertension. 6. Aortic atherosclerosis.  Cardiac Studies:   Lexiscan Sestamibi Stress Test 01/29/2014: There is a very small and very mild fixed defect in the mid anterior wall consistent with breast attenuation.. No reversible  perfusion defects are identified.  Echocardiogram 08/29/2018:  Left ventricle: The cavity size was normal. Systolic function was   normal. The estimated ejection fraction was in the range of 60%   to 65%. Images were inadequate for LV wall motion assessment. Abnormal septal shudder as well as respiratory related leftward   septal shift. Doppler parameters are consistent with high   ventricular filling pressure (E/e&' = 23). - Aortic valve: There was no stenosis. There was no regurgitation.   Mean gradient (S): 6 mm Hg. Valve area (VTI): 1.89 cm^2. Valve   area (Vmax): 1.77 cm^2. Valve area (Vmean): 1.63 cm^2. - Mitral valve: Calcified annulus.  There was mild regurgitation. - Left atrium: The atrium was moderately to severely dilated. - Right ventricle: The cavity size was normal. Wall thickness was   normal. Systolic function was normal. - Right atrium: The atrium was mildly dilated. - Tricuspid valve: There was trivial regurgitation. - Inferior vena cava: The vessel was dilated. The respirophasic  diameter changes were blunted (< 50%), consistent with elevated   central venous pressure. - Pericardium, extracardiac: A trivial pericardial effusion was   identified anterior to the heart. - Increased flow velocities, may be secondary to anemia,   thyrotoxicosis, or high flow state.   Echocardiogram 02/21/2020: 1. Left ventricular ejection fraction, by estimation, is 60 to 65%. The left ventricle has normal function. The left ventricle has no regional wall motion abnormalities. There is mild concentric left ventricular  hypertrophy.  Left ventricular diastolic parameters are consistent with Grade II diastolic dysfunction (pseudonormalization).  Elevated left ventricular end-diastolic pressure.  2. Right ventricular systolic function is normal. The right ventricular size is  normal. There is mildly elevated pulmonary artery systolic pressure.  3. Left atrial size was moderately dilated.  4. The mitral valve is normal in structure. Trivial mitral valve regurgitation. No evidence of mitral stenosis.  5. The aortic valve was not well visualized. Aortic valve regurgitation is not visualized. No aortic stenosis is present.  6. The inferior vena cava is dilated in size with <50% respiratory variability, suggesting right atrial pressure of 15 mmHg.   Lexiscan Sestamibi stress test 03/10/2020: No previous exam available for comparison. Lexiscan nuclear stress test performed using 1-day protocol. Stress EKG is non-diagnostic, as this is pharmacological stress test. Normal myocardial perfusion. Stress LVEF 59%. Low risk study.    EKG    02/20/20: Sinus rhythm. Left posterior fascicular block. Low voltage, precordial leads. Consider anterior infarct.  09/26/2019: Normal sinus rhythm at rate of 82 bpm, normal axis, poor R-wave progression, cannot exclude anteroseptal infarct old.  Low voltage complexes, pulmonary disease pattern.  08/29/2018: Normal sinus rhythm at the rate of 93 bpm, left atrial abnormality, rightward axis, poor R-wave progression, nonspecific ST abnormality, low voltage complexes.  Pulmonary disease pattern.  Prolonged QT.   Assessment     ICD-10-CM   1. Chronic diastolic CHF (congestive heart failure) (HCC)  I50.32 Brain natriuretic peptide    Basic metabolic panel    torsemide (DEMADEX) 20 MG tablet  2. Dyspnea on exertion  R06.00   3. Essential hypertension, benign  I10 lisinopril (ZESTRIL) 10 MG tablet  4. Stage 3b chronic kidney disease  N18.32   5. Bilateral leg edema  R60.0 torsemide  (DEMADEX) 20 MG tablet     Meds ordered this encounter  Medications  . lisinopril (ZESTRIL) 10 MG tablet    Sig: Take 1 tablet (10 mg total) by mouth daily.  Marland Kitchen torsemide (DEMADEX) 20 MG tablet    Sig: Take 1 tablet (20 mg total) by mouth daily. Take extra one tab for leg swelling    Dispense:  90 tablet    Refill:  2    Medications Discontinued During This Encounter  Medication Reason  . furosemide (LASIX) 40 MG tablet Change in therapy  . lisinopril (PRINIVIL,ZESTRIL) 20 MG tablet Reorder  . torsemide (DEMADEX) 20 MG tablet Reorder    Recommendations:   Vestal Crandall Govoni  is a 64 y.o. female with morbid obesity BMI 44, hypertension, restrictive airway disease due to bronchial asthma, hyperlipidemia, HFpEF, OSA and not using CPAP due to feeling claustrophobic is here for 6 month and also post hospital admission follow up. Patient was admitted to ICU at the Hospital on 02/26/2020 for acute respiratory failure due to acute pulmonary edema in the setting of uncontrolled hypertension and diastolic CHF exacerbation.   On her last office visit 6 weeks ago, had switched her from furosemide to torsemide and also discontinued Procardia and switched her to metoprolol succinate.   Patient states that presently she is feeling the best she has in quite a while, there is no clinical evidence of heart failure, leg edema has improved significantly.  I encouraged her to continue to lose weight.  States that she has brought a treadmill and will start exercising soon.  I reviewed the results of the stress test and reassured her.  I reviewed her external labs, she has had mild worsening renal function with aggressive diuresis, advised her to reduce torsemide from twice daily dosing to once a day and to use twice daily dosing only if she has worsening leg edema.  Weight loss and  salt restriction discussed extensively.  Advised her that she could restart lisinopril but will reduce from 20 mg to 10 mg daily,  would like to obtain a BMP and a BMP in 2 weeks to reestablish a baseline.  I would like to see her back in 3 months for close monitoring of heart failure and hypertension and renal failure.  Adrian Prows, MD, [facility] 04/18/2020, 12:50 PM [facility]. [place] Pager: [phone] Office: [phone]

## 2020-04-29 ENCOUNTER — Telehealth: Payer: Self-pay | Admitting: *Deleted

## 2020-04-29 NOTE — Telephone Encounter (Signed)
Left a message for patient and requested a return call.

## 2020-04-29 NOTE — Telephone Encounter (Signed)
Bridget Lamb called back and said the bleeding is like the last few days of a period where it is not very much and a faint pink in color.  Advised her to keep her appointment on 05/01/20 with Dr. Berline Lopes.  She verbalized understanding.

## 2020-04-29 NOTE — Telephone Encounter (Signed)
Patient called and stated "I had surgery with Dr Berline Lopes had in December; ([place] with BSO). I started spotting. At first it was faint then more to a spotting. Now it's heavier. I am wearing a pad. I started feeling cramp/bad like I did before a would start a period. The spotting started about 4-5 days ago. I have no fever, pain discharge or odor." Explained that Dr Berline Lopes is in the OR and the message would be given to [facility] APP

## 2020-05-01 ENCOUNTER — Other Ambulatory Visit: Payer: Self-pay

## 2020-05-01 ENCOUNTER — Inpatient Hospital Stay: Payer: BC Managed Care – PPO

## 2020-05-01 ENCOUNTER — Inpatient Hospital Stay: Payer: BC Managed Care – PPO | Attending: Gynecologic Oncology | Admitting: Gynecologic Oncology

## 2020-05-01 ENCOUNTER — Encounter: Payer: Self-pay | Admitting: Gynecologic Oncology

## 2020-05-01 VITALS — BP 109/59 | HR 65 | Temp 98.6°F | Resp 18 | Ht 64.0 in | Wt 248.0 lb

## 2020-05-01 DIAGNOSIS — N898 Other specified noninflammatory disorders of vagina: Secondary | ICD-10-CM | POA: Insufficient documentation

## 2020-05-01 DIAGNOSIS — D398 Neoplasm of uncertain behavior of other specified female genital organs: Secondary | ICD-10-CM

## 2020-05-01 DIAGNOSIS — N939 Abnormal uterine and vaginal bleeding, unspecified: Secondary | ICD-10-CM | POA: Diagnosis not present

## 2020-05-01 DIAGNOSIS — C541 Malignant neoplasm of endometrium: Secondary | ICD-10-CM

## 2020-05-01 DIAGNOSIS — Z90710 Acquired absence of both cervix and uterus: Secondary | ICD-10-CM | POA: Diagnosis not present

## 2020-05-01 DIAGNOSIS — Z90722 Acquired absence of ovaries, bilateral: Secondary | ICD-10-CM | POA: Insufficient documentation

## 2020-05-01 LAB — BASIC METABOLIC PANEL
Anion gap: 11 (ref 5–15)
BUN: 38 mg/dL — ABNORMAL HIGH (ref 8–23)
CO2: 28 mmol/L (ref 22–32)
Calcium: 10.3 mg/dL (ref 8.9–10.3)
Chloride: 103 mmol/L (ref 98–111)
Creatinine, Ser: 1.72 mg/dL — ABNORMAL HIGH (ref 0.44–1.00)
GFR calc Af Amer: 36 mL/min — ABNORMAL LOW (ref >60)
GFR calc non Af Amer: 31 mL/min — ABNORMAL LOW (ref >60)
Glucose, Bld: 94 mg/dL (ref 70–99)
Potassium: 5.2 mmol/L — ABNORMAL HIGH (ref 3.5–5.1)
Sodium: 142 mmol/L (ref 135–145)

## 2020-05-01 NOTE — Progress Notes (Signed)
Gynecologic Oncology Return Clinic Visit  05/01/20  Reason for Visit: vaginal bleeding in the setting of low-risk early-stage uterine cancer  Treatment History: Oncology History Overview Note  CA-125  10/12/19: 48.9  MSI stable   Endometrial cancer (HCC)  09/19/2019 Initial Diagnosis   Endometrial cancer (HCC)   09/19/2019 Initial Biopsy   EMB: EMCA with clear cell features   10/17/2019 Imaging   CT A/P: Mild endometrial thickening in this patient with known endometrial cancer. No evidence of metastatic disease in the abdomen/pelvis.  CXR 12/28: No evidence of acute cardiopulmonary abnormality.   10/24/2019 Cancer Staging   Staging form: Corpus Uteri - Carcinoma and Carcinosarcoma, AJCC 8th Edition - Clinical stage from 10/24/2019: FIGO Stage IA (cT1a, cN0(sn), cM0) - Signed by Carver Fila, MD on 05/01/2020   10/25/2019 Surgery   TRH/BSO, bil SLN, repair of sulcal tear Findings: On EUA, small cervix, moderately mobile uterus, no adnexal masses.  On intra-abdominal entry, adhesions from the omentum to the anterior wall at the patient's prior hernia repair as well as to the lateral anterior wall bilaterally.  Right aspect of the liver and diaphragm of secured by adhesions.  Left liver edge, diaphragm and stomach normal in appearance.  Uterus 10 cm in bulbous.  Posterior cul-de-sac completely obliterated and bilateral fallopian tubes and adnexa adherent to posterior uterus and sigmoid mesentery.  Fallopian tubes and left ovary somewhat concerning for involvement by tumor versus prior history of pelvic infection or endometriosis.  Mapping successful to bilateral external iliac sentinel lymph nodes.  Small and large bowel normal appearing.  No intra-abdominal evidence of disease.  Significant right sulcal tear noted after delivery of specimen through the vagina, repaired robotically as well as vaginally.   10/25/2019 Cancer Staging   Endometrial cancer, Stage IA, grade 1  endometrioid MI <50%, no LVSI, negative SLNs     Interval History: Patient reports overall being in her normal state of health until the end of June when she initially noticed some dark brown spotting.  Over the course of several days, this lightened.  Since then, she has had intermittent like pink bleeding requiring her to wear a pad.  Most often, there are spots or a streak on the pad when she changes it.  Before the bleeding started, she had a week of pelvic aching, premenstrual symptoms.  She denies any heavy bleeding or any further pain or cramping.  She denies starting any new medications.  She is continued on her baby aspirin.  More recently, she was seen at [facility].  She has started a diet to improve her protein intake and limit sweets and carbohydrates.  She has also started walking on a treadmill.  She denies any heavy lifting or any vaginal trauma.  Past Medical/Surgical History: Past Medical History:  Diagnosis Date  . Allergic rhinitis   . Anemia    hematology, Dr. Arbutus Ped, last 01/2011  . Asthma   . CHF (congestive heart failure) (HCC) 2004  . Chronic kidney disease    protein in urine  . Complication of anesthesia   . Dyslipidemia   . Dyspnea    on excertion  . Endometrial cancer (HCC)   . H/O echocardiogram 01/07/04   mild LVH, nl LV systolic function, EF 60%, left atrial enlargement, mildly thickened aortic and mitral valves  . History of thyroid cancer   . Hypertension   . Incarcerated ventral hernia 10/07/2013  . Obesity, Class III, BMI 40-49.9 (morbid obesity) (HCC) 10/09/2013  .  Osteopenia   . PONV (postoperative nausea and vomiting)   . Umbilical hernia   . Unspecified essential hypertension 10/09/2013  . Unspecified hypothyroidism 10/09/2013    Past Surgical History:  Procedure Laterality Date  . BONE MARROW BIOPSY    . CARDIAC CATHETERIZATION  2005  . CHOLECYSTECTOMY  2003  . COLONOSCOPY  2008  . ROBOTIC ASSISTED TOTAL HYSTERECTOMY WITH  BILATERAL SALPINGO OOPHERECTOMY N/A 10/25/2019   Procedure: XI ROBOTIC ASSISTED TOTAL HYSTERECTOMY WITH BILATERAL SALPINGO OOPHORECTOMY;  Surgeon: Lafonda Mosses, MD;  Location: [facility];  Service: Gynecology;  Laterality: N/A;  . SENTINEL NODE BIOPSY N/A 10/25/2019   Procedure: SENTINEL LYMPH NODE BIOPSY;  Surgeon: Lafonda Mosses, MD;  Location: [facility];  Service: Gynecology;  Laterality: N/A;  . THYROID SURGERY     partial thyroidectomy  . TOTAL THYROIDECTOMY  2009  . VENTRAL HERNIA REPAIR N/A 10/07/2013   Procedure: HERNIA REPAIR VENTRAL ADULT;  Surgeon: Gwenyth Ober, MD;  Location: [place];  Service: General;  Laterality: N/A;    Family History  Problem Relation Age of Onset  . Hypertension Mother   . Pancreatic cancer Mother   . Breast cancer Maternal Aunt   . Lung cancer Father   . Ovarian cancer Neg Hx   . Endometrial cancer Neg Hx   . Colon cancer Neg Hx     Social History   Socioeconomic History  . Marital status: Divorced    Spouse name: Not on file  . Number of children: 1  . Years of education: Not on file  . Highest education level: Not on file  Occupational History  . Not on file  Tobacco Use  . Smoking status: Never Smoker  . Smokeless tobacco: Never Used  Vaping Use  . Vaping Use: Never used  Substance and Sexual Activity  . Alcohol use: No  . Drug use: No  . Sexual activity: Not on file  Other Topics Concern  . Not on file  Social History Narrative   Works at [employer] at [employer], is on her feet for 8-hour shifts   Social Determinants of Health   Financial Resource Strain:   . Difficulty of Paying Living Expenses:   Food Insecurity:   . Worried About Charity fundraiser in the Last Year:   . Arboriculturist in the Last Year:   Transportation Needs:   . Film/video editor (Medical):   Marland Kitchen Lack of Transportation (Non-Medical):   Physical Activity:   . Days of Exercise per Week:   . Minutes of Exercise per Session:    Stress:   . Feeling of Stress :   Social Connections:   . Frequency of Communication with Friends and Family:   . Frequency of Social Gatherings with Friends and Family:   . Attends Religious Services:   . Active Member of Clubs or Organizations:   . Attends Archivist Meetings:   Marland Kitchen Marital Status:     Current Medications:  Current Outpatient Medications:  .  albuterol (PROVENTIL HFA;VENTOLIN HFA) 108 (90 Base) MCG/ACT inhaler, Inhale 2 puffs into the lungs every 4 (four) hours as needed for wheezing or shortness of breath., Disp: 1 Inhaler, Rfl: 0 .  aspirin EC 81 MG tablet, Take 81 mg by mouth daily., Disp: , Rfl:  .  Calcium Carbonate-Vitamin D (CALCIUM 600+D PO), Take 1 tablet by mouth daily., Disp: , Rfl:  .  DULERA 100-5 MCG/ACT AERO, SMARTSIG:2 Puff(s) By Mouth Twice  Daily, Disp: , Rfl:  .  FeFum-FePoly-FA-B Cmp-C-Biot (INTEGRA PLUS) CAPS, Take 1 capsule by mouth daily., Disp: , Rfl:  .  levothyroxine (SYNTHROID, LEVOTHROID) 150 MCG tablet, Take 150 mcg by mouth daily before breakfast. , Disp: , Rfl:  .  lisinopril (ZESTRIL) 10 MG tablet, Take 1 tablet (10 mg total) by mouth daily., Disp: , Rfl:  .  loratadine (CLARITIN) 10 MG tablet, Take 10 mg by mouth daily., Disp: , Rfl:  .  metoprolol succinate (TOPROL-XL) 25 MG 24 hr tablet, Take 1 tablet (25 mg total) by mouth daily. Take with or immediately following a meal., Disp: 30 tablet, Rfl: 2 .  olopatadine (PATANOL) 0.1 % ophthalmic solution, INSTILL 1 DROP INTO EACH EYE TWICE DAILY, Disp: , Rfl:  .  rosuvastatin (CRESTOR) 10 MG tablet, Take 10 mg by mouth at bedtime., Disp: , Rfl:  .  senna-docusate (SENOKOT-S) 8.6-50 MG tablet, Take 1 tablet by mouth 2 (two) times daily as needed for moderate constipation., Disp:  , Rfl:  .  torsemide (DEMADEX) 20 MG tablet, Take 1 tablet (20 mg total) by mouth daily. Take extra one tab for leg swelling, Disp: 90 tablet, Rfl: 2  Review of Systems: Pertinent positives as per  HPI. Denies appetite changes, fevers, chills, fatigue, unexplained weight changes. Denies hearing loss, neck lumps or masses, mouth sores, ringing in ears or voice changes. Denies cough or wheezing.  Denies shortness of breath. Denies chest pain or palpitations. Denies leg swelling. Denies abdominal distention, pain, blood in stools, constipation, diarrhea, nausea, vomiting, or early satiety. Denies pain with intercourse, dysuria, frequency, hematuria or incontinence. Denies hot flashes, pelvic pain, or vaginal discharge.   Denies joint pain, back pain or muscle pain/cramps. Denies itching, rash, or wounds. Denies dizziness, headaches, numbness or seizures. Denies swollen lymph nodes or glands, denies easy bruising or bleeding. Denies anxiety, depression, confusion, or decreased concentration.  Physical Exam: BP (!) 109/59 (BP Location: Left Arm, Patient Position: Sitting)   Pulse 65   Temp 98.6 F (37 C) (Oral)   Resp 18   Ht 5\' 4"  (1.626 m)   Wt 248 lb (112.5 kg)   LMP 10/22/2011   SpO2 100%   BMI 42.57 kg/m  General: Alert, oriented, no acute distress. HEENT: Normocephalic, atraumatic, sclera anicteric. Chest: Clear to auscultation bilaterally.  No wheezes or rhonchi.. Cardiovascular: Regular rate and rhythm, no murmurs. Abdomen: Obese, soft, nontender.  Normoactive bowel sounds.  No masses or hepatosplenomegaly appreciated.  Well-healed laparoscopic incisions. Extremities: Grossly normal range of motion.  Warm, well perfused.  No edema bilaterally. Skin: No rashes or lesions noted. Lymphatics: No cervical, supraclavicular, or inguinal adenopathy. GU: Normal appearing external genitalia without erythema, excoriation, or lesions.  Speculum exam reveals small amount of blood in the vaginal vault.  The apex is narrowed centrally and there is some very friable frondular appearing tissue along the midportion of the cuff.  With the patient's verbal consent for a vaginal cuff mass  biopsy, this area was cleansed with Betadine x3 and biopsied several times with ring forcep.  Monsel's and pressure were then used to achieve hemostasis.  Bimanual exam reveals some pedunculated tissue felt at the vaginal cuff, no discrete mass or nodularity.  Rectovaginal exam confirms these findings although is somewhat limited by patient's body habitus..  Laboratory & Radiologic Studies: None new  Assessment & Plan: Bridget Lamb is a 64 y.o. woman with early stage low risk endometrial cancer treated with surgery just over 6 months ago who  presents for vaginal bleeding and polypoid mass at the top of the vagina concerning for possible recurrence.  Vaginal cuff biopsy taken today.  I discussed my concern that this could be a recurrence with the patient.  I will call her once we have the biopsy results back.  In the meantime, I have ordered a CT of the abdomen and pelvis to assess for extent of disease if this is a recurrence.  This would be somewhat abnormal given her final pathology although her endometrial biopsy showed clear features on initial diagnosis.  Because of her final pathology, the patient did not receive adjuvant therapy.  I commended the patient on her recent visit with [facility] and the diet and exercise changes she has made.  22 minutes of total time was spent for this patient encounter, including preparation, face-to-face counseling with the patient and coordination of care, and documentation of the encounter.  Jeral Pinch, MD  Division of Gynecologic Oncology  Department of Obstetrics and Gynecology  [facility]

## 2020-05-01 NOTE — Patient Instructions (Signed)
I will call you with the results of your biopsy.  In the meantime, we will work on getting a CT scheduled.  I am concerned that this may be cancer that has come back at the top of the vagina although I do not feel a mass on your exam.

## 2020-05-02 ENCOUNTER — Other Ambulatory Visit: Payer: Self-pay | Admitting: Gynecologic Oncology

## 2020-05-02 ENCOUNTER — Telehealth: Payer: Self-pay

## 2020-05-02 ENCOUNTER — Encounter: Payer: Self-pay | Admitting: Gynecologic Oncology

## 2020-05-02 ENCOUNTER — Telehealth: Payer: Self-pay | Admitting: *Deleted

## 2020-05-02 DIAGNOSIS — C541 Malignant neoplasm of endometrium: Secondary | ICD-10-CM

## 2020-05-02 LAB — SURGICAL PATHOLOGY

## 2020-05-02 NOTE — Telephone Encounter (Signed)
Patient called regarding her CT scan. Explained that insurance will not approve the scan to be done at [facility], but will for [place]. Gave new appt date/time at [facility] imaging. Appt scheduled for 7/23 to 2:40 pm. Explained to the patient the [place] said to call them everyday to see if there is a cancellation

## 2020-05-02 NOTE — Progress Notes (Signed)
Bmet ordered 

## 2020-05-02 NOTE — Progress Notes (Signed)
On phone with insurance for 20 minutes about patient's CT scan.  Per rep, no facility was selected for the scan which was holding up the authorization.  [facility] and [facility] are out of network for her so the insurance recommends using [facility] which is in network. CT authorized with Auth# [id number] valid 05/01/20 through 06/29/2020. [facility] auth department notified. CT will be changed to [facility].

## 2020-05-02 NOTE — Telephone Encounter (Signed)
Per Dr. Jeral Pinch: Just talk to Bridget Lamb. She knows about plan to come in on Monday. She goes to work at nine, so I told her to come to the lab at 8 AM. Would it be OK to switch the appointment to that early?  Told her we would let her know with the results. If her kidney function continues to worsen even after hydration, and she knows that we're going to have her go into the emergency department to get a scan and perhaps some other intervention depending on what that shows

## 2020-05-03 ENCOUNTER — Other Ambulatory Visit (HOSPITAL_COMMUNITY)
Admission: RE | Admit: 2020-05-03 | Discharge: 2020-05-03 | Disposition: A | Discharge status: Home / Self Care | Payer: BC Managed Care – PPO | Source: Ambulatory Visit | Attending: Pulmonary Disease | Admitting: Pulmonary Disease

## 2020-05-03 DIAGNOSIS — Z20822 Contact with and (suspected) exposure to covid-19: Secondary | ICD-10-CM | POA: Insufficient documentation

## 2020-05-03 DIAGNOSIS — Z01812 Encounter for preprocedural laboratory examination: Secondary | ICD-10-CM | POA: Insufficient documentation

## 2020-05-03 LAB — SARS CORONAVIRUS 2 (TAT 6-24 HRS): SARS Coronavirus 2: NEGATIVE

## 2020-05-05 ENCOUNTER — Other Ambulatory Visit: Payer: Self-pay

## 2020-05-05 ENCOUNTER — Encounter (HOSPITAL_COMMUNITY): Payer: Self-pay

## 2020-05-05 ENCOUNTER — Emergency Department (HOSPITAL_COMMUNITY)
Admission: EM | Admit: 2020-05-05 | Discharge: 2020-05-05 | Disposition: A | Discharge status: Home / Self Care | Payer: BC Managed Care – PPO | Attending: Emergency Medicine | Admitting: Emergency Medicine

## 2020-05-05 ENCOUNTER — Emergency Department (HOSPITAL_COMMUNITY): Payer: BC Managed Care – PPO

## 2020-05-05 ENCOUNTER — Inpatient Hospital Stay: Payer: BC Managed Care – PPO

## 2020-05-05 DIAGNOSIS — Z79899 Other long term (current) drug therapy: Secondary | ICD-10-CM | POA: Diagnosis not present

## 2020-05-05 DIAGNOSIS — N179 Acute kidney failure, unspecified: Secondary | ICD-10-CM | POA: Diagnosis not present

## 2020-05-05 DIAGNOSIS — C541 Malignant neoplasm of endometrium: Secondary | ICD-10-CM | POA: Diagnosis not present

## 2020-05-05 DIAGNOSIS — I5032 Chronic diastolic (congestive) heart failure: Secondary | ICD-10-CM | POA: Insufficient documentation

## 2020-05-05 DIAGNOSIS — C73 Malignant neoplasm of thyroid gland: Secondary | ICD-10-CM | POA: Diagnosis not present

## 2020-05-05 DIAGNOSIS — J45909 Unspecified asthma, uncomplicated: Secondary | ICD-10-CM | POA: Insufficient documentation

## 2020-05-05 DIAGNOSIS — Z7951 Long term (current) use of inhaled steroids: Secondary | ICD-10-CM | POA: Insufficient documentation

## 2020-05-05 DIAGNOSIS — N189 Chronic kidney disease, unspecified: Secondary | ICD-10-CM | POA: Diagnosis not present

## 2020-05-05 DIAGNOSIS — E039 Hypothyroidism, unspecified: Secondary | ICD-10-CM | POA: Insufficient documentation

## 2020-05-05 DIAGNOSIS — I130 Hypertensive heart and chronic kidney disease with heart failure and stage 1 through stage 4 chronic kidney disease, or unspecified chronic kidney disease: Secondary | ICD-10-CM | POA: Insufficient documentation

## 2020-05-05 DIAGNOSIS — R7989 Other specified abnormal findings of blood chemistry: Secondary | ICD-10-CM | POA: Diagnosis present

## 2020-05-05 HISTORY — DX: Sleep apnea, unspecified: G47.30

## 2020-05-05 LAB — CBC WITH DIFFERENTIAL/PLATELET
Abs Immature Granulocytes: 0.03 10*3/uL (ref 0.00–0.07)
Basophils Absolute: 0 10*3/uL (ref 0.0–0.1)
Basophils Relative: 0 %
Eosinophils Absolute: 0.1 10*3/uL (ref 0.0–0.5)
Eosinophils Relative: 1 %
HCT: 27.3 % — ABNORMAL LOW (ref 36.0–46.0)
Hemoglobin: 8.8 g/dL — ABNORMAL LOW (ref 12.0–15.0)
Immature Granulocytes: 0 %
Lymphocytes Relative: 15 %
Lymphs Abs: 1.4 10*3/uL (ref 0.7–4.0)
MCH: 31.2 pg (ref 26.0–34.0)
MCHC: 32.2 g/dL (ref 30.0–36.0)
MCV: 96.8 fL (ref 80.0–100.0)
Monocytes Absolute: 0.5 10*3/uL (ref 0.1–1.0)
Monocytes Relative: 5 %
Neutro Abs: 7.2 10*3/uL (ref 1.7–7.7)
Neutrophils Relative %: 79 %
Platelets: 190 10*3/uL (ref 150–400)
RBC: 2.82 MIL/uL — ABNORMAL LOW (ref 3.87–5.11)
RDW: 15 % (ref 11.5–15.5)
WBC: 9.2 10*3/uL (ref 4.0–10.5)
nRBC: 0 % (ref 0.0–0.2)

## 2020-05-05 LAB — BASIC METABOLIC PANEL
Anion gap: 10 (ref 5–15)
BUN: 51 mg/dL — ABNORMAL HIGH (ref 8–23)
CO2: 27 mmol/L (ref 22–32)
Calcium: 9.7 mg/dL (ref 8.9–10.3)
Chloride: 102 mmol/L (ref 98–111)
Creatinine, Ser: 1.93 mg/dL — ABNORMAL HIGH (ref 0.44–1.00)
GFR calc Af Amer: 31 mL/min — ABNORMAL LOW (ref >60)
GFR calc non Af Amer: 27 mL/min — ABNORMAL LOW (ref >60)
Glucose, Bld: 95 mg/dL (ref 70–99)
Potassium: 5.2 mmol/L — ABNORMAL HIGH (ref 3.5–5.1)
Sodium: 139 mmol/L (ref 135–145)

## 2020-05-05 NOTE — Telephone Encounter (Signed)
Told Crystal the information noted below about pt with Acute renal failure in the setting of recurrent endometrial cancer and the need for a urgent CT scan to evaluate possible tumor impinging on ureters. Crystal will relay information.  Told Crystal pt may be arriveing ~ 1 pm due to work.

## 2020-05-05 NOTE — ED Triage Notes (Signed)
Patient states she was sent to the ED for K=-5.2, BUN-51, and Creatinine-1.93. patient states she is scheduled for CT scan later this month,but her physician wanted her to have a CT scan today.

## 2020-05-05 NOTE — Telephone Encounter (Signed)
Spoke with Bridget Lamb and discussed the results of the labs and recommendation of Dr. Berline Lamb as noted below.  She is at work and gets off at 6 pm.  Relayed to her the urgency of being evaluated sooner then later today. Offered to speak with employer to discuss situation. Pt declined. Bridget Vandervort stated that she maybe able to leave work at ~ 12 pm and go to the ED then. Encouraged her to go ASAP. Pt last ate at 0900.  Told her not to eat until she gets CT scan. Pt verbalized understanding and appreciated the call.

## 2020-05-05 NOTE — Discharge Instructions (Addendum)
Please follow closely with Dr. Berline Lopes and your PCP for further evaluation of your worsening renal function. Please return to the emergency department for any new or worsening symptoms.

## 2020-05-05 NOTE — ED Notes (Signed)
Patient transported to CT 

## 2020-05-05 NOTE — ED Provider Notes (Signed)
[facility] DEPT Provider Note   CSN: [id number] Arrival date & time: 05/05/20  1305     History Chief Complaint  Patient presents with  . Abnormal Lab    Bridget Lamb is a 64 y.o. female w PMHx endometrial cancer s/p total abdominal hysterectomy in Dec 2020, HTN, CHF, presenting to the ED with abnormal creatinine. Pt followed by cancer center, Dr. Berline Lopes.  Patient states Bridget Lamb was recently diagnosed with endometrial cancer in December and had a total hysterectomy later in the month.  Bridget Lamb states Bridget Lamb did not receive any chemotherapy or radiation treatment because her oncologist removed all of the tumor.  Bridget Lamb states the end of June Bridget Lamb began having a vaginal bleeding, and her oncologist saw concerning findings on a pelvic exam which was biopsied resulted as cancerous.  Bridget Lamb has been having light vaginal bleeding since that time.  Bridget Lamb was scheduled for an outpatient CT scan of her abdomen and pelvis for later in the month with concern for possible metastasis as cause of gradually worsening renal function.  Bridget Lamb states they took her off lisinopril and adjusted her doses in an attempt to improve her renal function however that was not successful.  Bridget Lamb had her creatinine rechecked today, creatinine continues to increase, is 1.93 today.  Bridget Lamb was called on recommended to come to the ED for more urgent CT scan for further evaluation.  Bridget Lamb denies any new pain, nausea, vomiting, poor appetite or urinary symptoms.  Bridget Lamb states Bridget Lamb did not take her blood pressure medication today.  Bridget Lamb overall feels well.    The history is provided by the patient and medical records.       Past Medical History:  Diagnosis Date  . Allergic rhinitis   . Anemia    hematology, Dr. Julien Nordmann, last 01/2011  . Asthma   . CHF (congestive heart failure) ([place]) 2004  . Chronic kidney disease    protein in urine  . Complication of anesthesia   . Dyslipidemia   . Dyspnea    on excertion  .  Endometrial cancer ([place])   . H/O echocardiogram 01/07/04   mild LVH, nl LV systolic function, EF 06%, left atrial enlargement, mildly thickened aortic and mitral valves  . History of thyroid cancer   . Hypertension   . Incarcerated ventral hernia 10/07/2013  . Obesity, Class III, BMI 40-49.9 (morbid obesity) ([place]) 10/09/2013  . Osteopenia   . PONV (postoperative nausea and vomiting)   . Sleep apnea   . Umbilical hernia   . Unspecified essential hypertension 10/09/2013  . Unspecified hypothyroidism 10/09/2013    Patient Active Problem List   Diagnosis Date Noted  . Vaginal lesion 05/01/2020  . OSA (obstructive sleep apnea) 03/07/2020  . Abnormal findings on diagnostic imaging of lung 03/07/2020  . Acute pulmonary edema ([place]) 02/20/2020  . Hypertensive urgency   . Dyspnea   . Endometrial cancer ([employer]) 10/12/2019  . Bilateral leg edema 12/20/2018  . Chronic heart failure with preserved ejection fraction ([place]) 12/20/2018  . Asthma, chronic, unspecified asthma severity, with acute exacerbation 08/29/2018  . Acute respiratory failure with hypoxia and hypercapnia ([place]) 08/29/2018  . Iron deficiency anemia 02/04/2015  . Chronic diastolic CHF (congestive heart failure) ([place]) 12/24/2013  . Obesity, Class III, BMI 40-49.9 (morbid obesity) ([place]) 10/09/2013  . Essential hypertension, benign 01/31/2012  . Dyslipidemia 01/31/2012  . Hypothyroidism 01/31/2012  . Anemia 01/31/2012    Past Surgical History:  Procedure Laterality Date  . BONE MARROW BIOPSY    .  CARDIAC CATHETERIZATION  2005  . CHOLECYSTECTOMY  2003  . COLONOSCOPY  2008  . ROBOTIC ASSISTED TOTAL HYSTERECTOMY WITH BILATERAL SALPINGO OOPHERECTOMY N/A 10/25/2019   Procedure: XI ROBOTIC ASSISTED TOTAL HYSTERECTOMY WITH BILATERAL SALPINGO OOPHORECTOMY;  Surgeon: Lafonda Mosses, MD;  Location: [facility];  Service: Gynecology;  Laterality: N/A;  . SENTINEL NODE BIOPSY N/A 10/25/2019   Procedure: SENTINEL LYMPH NODE BIOPSY;   Surgeon: Lafonda Mosses, MD;  Location: [facility];  Service: Gynecology;  Laterality: N/A;  . THYROID SURGERY     partial thyroidectomy  . TOTAL THYROIDECTOMY  2009  . VENTRAL HERNIA REPAIR N/A 10/07/2013   Procedure: HERNIA REPAIR VENTRAL ADULT;  Surgeon: Gwenyth Ober, MD;  Location: [facility];  Service: General;  Laterality: N/A;     OB History    Gravida  3   Para  1   Term      Preterm      AB  2   Living  1     SAB      TAB      Ectopic      Multiple      Live Births           Obstetric Comments  Last mammogram: 12/25/2018 Last colonoscopy: 2012 Last Pap smear: 08/15/2019, negative, HPV negative History of abnormal Pap smears: No History of sexually transmitted diseases: Denies Age at menarche: 5        Family History  Problem Relation Age of Onset  . Hypertension Mother   . Pancreatic cancer Mother   . Breast cancer Maternal Aunt   . Lung cancer Father   . Ovarian cancer Neg Hx   . Endometrial cancer Neg Hx   . Colon cancer Neg Hx     Social History   Tobacco Use  . Smoking status: Never Smoker  . Smokeless tobacco: Never Used  Vaping Use  . Vaping Use: Never used  Substance Use Topics  . Alcohol use: No  . Drug use: No    Home Medications Prior to Admission medications   Medication Sig Start Date End Date Taking? Authorizing Provider  albuterol (PROVENTIL HFA;VENTOLIN HFA) 108 (90 Base) MCG/ACT inhaler Inhale 2 puffs into the lungs every 4 (four) hours as needed for wheezing or shortness of breath. 09/01/18  Yes Aline August, MD  aspirin EC 81 MG tablet Take 81 mg by mouth daily.   Yes [provider]  Calcium Carbonate-Vitamin D (CALCIUM 600+D PO) Take 1 tablet by mouth daily.   Yes [provider]  DULERA 100-5 MCG/ACT AERO Inhale 2 puffs into the lungs in the morning and at bedtime.  03/05/20  Yes [provider]  FeFum-FePoly-FA-B Cmp-C-Biot (INTEGRA PLUS) CAPS Take 1 capsule by mouth daily.   Yes [provider]  levothyroxine (SYNTHROID, LEVOTHROID) 150 MCG tablet Take 150 mcg by mouth daily before breakfast.    Yes [provider]  lisinopril (ZESTRIL) 10 MG tablet Take 1 tablet (10 mg total) by mouth daily. 04/18/20  Yes Adrian Prows, MD  loratadine (CLARITIN) 10 MG tablet Take 10 mg by mouth daily.   Yes [provider]  metoprolol succinate (TOPROL-XL) 25 MG 24 hr tablet Take 1 tablet (25 mg total) by mouth daily. Take with or immediately following a meal. 03/05/20 06/03/20 Yes Adrian Prows, MD  olopatadine (PATANOL) 0.1 % ophthalmic solution Place 1 drop into both eyes 2 (two) times daily.  03/26/20  Yes [provider]  rosuvastatin (CRESTOR) 10  MG tablet Take 10 mg by mouth at bedtime. 11/28/17  Yes [provider]  senna-docusate (SENOKOT-S) 8.6-50 MG tablet Take 1 tablet by mouth 2 (two) times daily as needed for moderate constipation. 02/26/20  Yes Mercy Riding, MD  torsemide (DEMADEX) 20 MG tablet Take 1 tablet (20 mg total) by mouth daily. Take extra one tab for leg swelling 04/18/20  Yes Adrian Prows, MD    Allergies    Patient has no known allergies.  Review of Systems   Review of Systems  All other systems reviewed and are negative.   Physical Exam Updated Vital Signs BP (!) 151/77 (BP Location: Left Arm)   Pulse 69   Temp 99.4 F (37.4 C) (Oral)   Resp 16   Ht _0  (1.626 m)   Wt 112.5 kg   LMP 10/22/2011   SpO2 99%   BMI 42.57 kg/m   Physical Exam Vitals and nursing note reviewed.  Constitutional:      General: Bridget Lamb is not in acute distress.    Appearance: Bridget Lamb is well-developed. Bridget Lamb is not ill-appearing.  HENT:     Head: Normocephalic and atraumatic.  Eyes:     Conjunctiva/sclera: Conjunctivae normal.  Cardiovascular:     Rate and Rhythm: Normal rate and regular rhythm.  Pulmonary:     Effort: Pulmonary effort is normal. No respiratory distress.     Breath sounds: Normal breath sounds.  Abdominal:     General: Bowel sounds  are normal.     Palpations: Abdomen is soft.     Tenderness: There is no abdominal tenderness.  Musculoskeletal:     Comments: 1+ pitting BLE (chronic, unchanging per pt)  Skin:    General: Skin is warm.  Neurological:     Mental Status: Bridget Lamb is alert.  Psychiatric:        Behavior: Behavior normal.     ED Results / Procedures / Treatments   Labs (all labs ordered are listed, but only abnormal results are displayed) Labs Reviewed  CBC WITH DIFFERENTIAL/PLATELET - Abnormal; Notable for the following components:      Result Value   RBC 2.82 (*)    Hemoglobin 8.8 (*)    HCT 27.3 (*)    All other components within normal limits    EKG None  Radiology CT Abdomen Pelvis Wo Contrast  Result Date: 05/05/2020 CLINICAL DATA:  High potassium and creatinine history of uterine cancer EXAM: CT ABDOMEN AND PELVIS WITHOUT CONTRAST TECHNIQUE: Multidetector CT imaging of the abdomen and pelvis was performed following the standard protocol without IV contrast. COMPARISON:  October 16, 2019 FINDINGS: Lower chest: The visualized heart size within normal limits. No pericardial fluid/thickening. No hiatal hernia. The visualized portions of the lungs are clear. Hepatobiliary: Although limited due to the lack of intravenous contrast, normal in appearance without gross focal abnormality. The patient is status post cholecystectomy. No biliary ductal dilation. Pancreas:  Unremarkable.  No surrounding inflammatory changes. Spleen: Normal in size. Although limited due to the lack of intravenous contrast, normal in appearance. Adrenals/Urinary Tract: Both adrenal glands appear normal. The kidneys and collecting system appear normal without evidence of urinary tract calculus or hydronephrosis. Bladder is unremarkable. Stomach/Bowel: The stomach, small bowel, and colon are normal in appearance. No inflammatory changes or obstructive findings. Scattered colonic diverticula are noted without diverticulitis.  Vascular/Lymphatic: There are no enlarged abdominal or pelvic lymph nodes. Scattered aortic atherosclerotic calcifications are seen without aneurysmal dilatation. Reproductive: The patient is status post hysterectomy. Other: No  evidence of abdominal wall mass or hernia. Musculoskeletal: No acute or significant osseous findings. Mild degenerative changes in the lower lumbar spine. IMPRESSION: No acute intra-abdominal or pelvic pathology to explain the patient's symptoms. Aortic Atherosclerosis (ICD10-I70.0). Electronically Signed   By: Prudencio Pair M.D.   On: 05/05/2020 16:57    Procedures Procedures (including critical care time)  Medications Ordered in ED Medications - No data to display  ED Course  I have reviewed the triage vital signs and the nursing notes.  Pertinent labs & imaging results that were available during my care of the patient were reviewed by me and considered in my medical decision making (see chart for details).  Clinical Course as of May 05 1805  Mon May 05, 2020  1715 Discussed reassuring CT results with Dr. Denman George, with gyn onc.  Bridget Lamb is agreeable with plan for discharge with close outpatient follow-up for further evaluation of patient's worsening renal function.  Patient is agreeable with this plan as well.   [JR]    Clinical Course User Index [JR] Ryka Beighley, [place] N, PA-C   MDM Rules/Calculators/A&P                          Pt with recent diagnosis of endometrial cancer in November 2020, s/p total hysterectomy in December, presenting with worsening renal function.  Patient has had her creatinine followed by her gyn oncologist Dr. Berline Lopes, last check today noted to be 1.93.  It has been gradually increasing.  Bridget Lamb was sent today for urgent CT scan to evaluate for possible mass causing an obstructive nephropathy.  Patient is overall well, has no current complaints, with the exception of her ongoing light vaginal bleeding that is being followed by Dr. Berline Lopes.   CT scan is  negative.  CBC with stable hemoglobin.  Followed up with Dr. Denman George, colleagues with Dr. Berline Lopes.  Patient is appropriate for outpatient management for further evaluation.  Bridget Lamb may need evaluation of her medications as possible cause, with slight hyperkalemia- Lisinopril? Torsemide?  Pt agreeable with plan, appropriate for discharge.  Final Clinical Impression(s) / ED Diagnoses Final diagnoses:  AKI (acute kidney injury) [facility])    Rx / [place] Orders ED Discharge Orders    None       Mussa Groesbeck, [place] N, PA-C 05/05/20 1806    Dorie Rank, MD 05/06/20 1129

## 2020-05-05 NOTE — Telephone Encounter (Signed)
Told Dr.Tucker that Bridget Lamb's Creatine is 1.93 today. Dr. Berline Lopes wants Bridget Lamb to go to the EDto be evaluated for acute renal failure in the setting of recurrent endometrial cancer at the vaginal cuff. She needs a CT of A/P sooner then 7-23 to evaluate for tumor possibly impinging on ureters.

## 2020-05-06 ENCOUNTER — Ambulatory Visit (HOSPITAL_COMMUNITY): Payer: BC Managed Care – PPO

## 2020-05-06 ENCOUNTER — Telehealth: Payer: Self-pay

## 2020-05-06 NOTE — Telephone Encounter (Signed)
LM for Bridget Lamb stating that her ED notes, ct scan report, labs from 05-05-20 were faxed to Bridget Lamb PA_C to review and set up an office visit asap to review medications and f/u kidney function. Requested that Bridget Lamb call her PCP office as well to follow up on getting an appointment set up.

## 2020-05-07 ENCOUNTER — Ambulatory Visit (INDEPENDENT_AMBULATORY_CARE_PROVIDER_SITE_OTHER): Payer: BC Managed Care – PPO | Admitting: Pulmonary Disease

## 2020-05-07 ENCOUNTER — Encounter: Payer: Self-pay | Admitting: Gynecologic Oncology

## 2020-05-07 ENCOUNTER — Other Ambulatory Visit: Payer: Self-pay

## 2020-05-07 ENCOUNTER — Other Ambulatory Visit: Payer: Self-pay | Admitting: *Deleted

## 2020-05-07 ENCOUNTER — Encounter: Payer: Self-pay | Admitting: Oncology

## 2020-05-07 DIAGNOSIS — R0602 Shortness of breath: Secondary | ICD-10-CM

## 2020-05-07 DIAGNOSIS — C541 Malignant neoplasm of endometrium: Secondary | ICD-10-CM

## 2020-05-07 LAB — PULMONARY FUNCTION TEST
DL/VA % pred: 146 %
DL/VA: 6.14 ml/min/mmHg/L
DLCO cor % pred: 97 %
DLCO cor: 19.49 ml/min/mmHg
DLCO unc % pred: 80 %
DLCO unc: 16.04 ml/min/mmHg
FEF 25-75 Post: 1.51 L/sec
FEF 25-75 Pre: 1.2 L/sec
FEF2575-%Change-Post: 25 %
FEF2575-%Pred-Post: 69 %
FEF2575-%Pred-Pre: 55 %
FEV1-%Change-Post: 5 %
FEV1-%Pred-Post: 56 %
FEV1-%Pred-Pre: 53 %
FEV1-Post: 1.39 L
FEV1-Pre: 1.31 L
FEV1FVC-%Change-Post: 7 %
FEV1FVC-%Pred-Pre: 102 %
FEV6-%Change-Post: -2 %
FEV6-%Pred-Post: 52 %
FEV6-%Pred-Pre: 54 %
FEV6-Post: 1.62 L
FEV6-Pre: 1.66 L
FEV6FVC-%Pred-Post: 104 %
FEV6FVC-%Pred-Pre: 104 %
FVC-%Change-Post: -2 %
FVC-%Pred-Post: 51 %
FVC-%Pred-Pre: 52 %
FVC-Post: 1.63 L
FVC-Pre: 1.66 L
Post FEV1/FVC ratio: 85 %
Post FEV6/FVC ratio: 100 %
Pre FEV1/FVC ratio: 79 %
Pre FEV6/FVC Ratio: 100 %
RV % pred: 88 %
RV: 1.83 L
TLC % pred: 69 %
TLC: 3.54 L

## 2020-05-07 LAB — NITRIC OXIDE: Nitric Oxide: 10

## 2020-05-07 NOTE — Progress Notes (Signed)
Virtual Visit via Telephone Note  I connected with Bridget Lamb on 05/08/20 at  9:30 AM EDT by telephone and verified that I am speaking with the correct person using two identifiers.  Location: Patient: Home Provider: Office [facility] - [address], [place], [place], [place] [address]   I discussed the limitations, risks, security and privacy concerns of performing an evaluation and management service by telephone and the availability of in person appointments. I also discussed with the patient that there may be a patient responsible charge related to this service. The patient expressed understanding and agreed to proceed.  Patient consented to consult via telephone: Yes People present and their role in pt care: Pt   History of Present Illness:  64 year old female never smoker followed in our office for asthma  PMH: Hypertension, dyslipidemia, hypothyroidism, obesity, diastolic heart failure Smoker/ Smoking History: Never smoker Maintenance:  Dulera 100 Pt of: Dr. Ander Slade  Chief complaint: PFT results   64 year old female never smoker followed in our office for asthma as well as dyspnea on exertion.  Patient recently completed pulmonary function testing.  Those results are listed below:  05/07/2020-pulmonary function test-FVC 1.66 (52% predicted), postbronchodilator ratio 85, postbronchodilator FEV1 1.9 (56% predicted), no bronchodilator response, TLC 3.54 (69% predicted), DLCO 16.04 (80% predicted)  Patient reporting that her shortness of breath is about the same.  She remains adherent to her [facility].  We never received a repeat chest x-ray as previously ordered in May.  We will discuss and organized today.  Patient did start CPAP therapy as previously discussed.  CPAP compliance report listed below:  04/07/2020-05/06/2020-30 had a last 30 days used, 27 of those days greater than 4 hours, average usage 6 hours and 12 minutes, APAP setting 5-15, AHI 1.7  Patient  reports that she is tolerating using CPAP therapy well.  She does wake up sometimes having a stuffy nose.  She reports that this resolves fairly quickly.  She suspects this is related to the fact that she is using a nasal mask. She is using humidified air.  Observations/Objective:  02/20/2020-CTA chest-no evidence of acute PE, patchy groundglass and consolidative opacities throughout aerated lungs bilaterally which may represent edema or infection, consider follow-up chest CT in 6 to 8 weeks after resolution of acute symptomology to ensure that nodularity resolves, moderate bilateral pleural effusions, dilated main pulmonary arteries as can be seen with pulmonary arterial hypertension  02/21/2020-echocardiogram-LV ejection fraction 60 to 65%, elevated left ventricular end-diastolic pressure, grade 2 diastolic dysfunction, right ventricle systolic function is normal, mildly elevated pulmonary systolic pressure  03/28/3328-JJOAC-ZYSAY sleep study-AHI 86.8  05/07/2020-pulmonary function test-FVC 1.66 (52% predicted), postbronchodilator ratio 85, postbronchodilator FEV1 1.9 (56% predicted), no bronchodilator response, TLC 3.54 (69% predicted), DLCO 16.04 (80% predicted)  Social History   Tobacco Use  Smoking Status Never Smoker  Smokeless Tobacco Never Used   Immunization History  Administered Date(s) Administered  . Influenza,inj,Quad PF,6+ Mos 06/26/2019  . Influenza,inj,Quad PF,6-35 Mos 06/26/2019  . Moderna SARS-COVID-2 Vaccination 02/11/2020  . Pneumococcal Polysaccharide-23 08/05/2015  . Pneumococcal-Unspecified 08/05/2015  . Tdap 01/22/2013, 01/12/2016  . Zoster Recombinat (Shingrix) 02/18/2018, 05/30/2018    Assessment and Plan:  OSA (obstructive sleep apnea) Excellent CPAP compliance report Tolerating CPAP therapy well Well-controlled AHI 1.7  Plan: Continue CPAP therapy Tolerating well 2 to 65-month follow-up with Dr. Jenetta Downer  Abnormal findings on diagnostic imaging of lung Chest  x-ray never completed from May/2021 office visit CTA chest shows multiple groundglass opacities from April/2021  Plan: CT chest ordered to compare to April/2021 to ensure resolution of GGO  Dyspnea Suspect the shortness of breath is likely multifactorial as patient has morbid obesity, diastolic heart failure and asthma.  Pulmonary function testing today showing likely restrictive lung disease.  Patient now is adequately treated severe obstructive sleep apnea  Plan: We will continue to clinically monitor Continue Dulera 100 Work on increasing your overall physical activity to work to reduce your BMI Continue follow-up with cardiology CT chest ordered to compare to April/2021 CT chest  Asthma, chronic, unspecified asthma severity, with acute exacerbation Plan: Continue Dulera 100   Follow Up Instructions:  Return in about 2 months (around 07/09/2020), or if symptoms worsen or fail to improve, for Follow up with Dr. Ander Slade.   I discussed the assessment and treatment plan with the patient. The patient was provided an opportunity to ask questions and all were answered. The patient agreed with the plan and demonstrated an understanding of the instructions.   The patient was advised to call back or seek an in-person evaluation if the symptoms worsen or if the condition fails to improve as anticipated.  I provided 24 minutes of non-face-to-face time during this encounter.   Lauraine Rinne, NP

## 2020-05-07 NOTE — Progress Notes (Signed)
Full PFT and Feno performed today 

## 2020-05-08 ENCOUNTER — Encounter: Payer: Self-pay | Admitting: Pulmonary Disease

## 2020-05-08 ENCOUNTER — Ambulatory Visit: Payer: BC Managed Care – PPO | Admitting: Pulmonary Disease

## 2020-05-08 ENCOUNTER — Ambulatory Visit (INDEPENDENT_AMBULATORY_CARE_PROVIDER_SITE_OTHER): Payer: BC Managed Care – PPO | Admitting: Pulmonary Disease

## 2020-05-08 DIAGNOSIS — R918 Other nonspecific abnormal finding of lung field: Secondary | ICD-10-CM | POA: Diagnosis not present

## 2020-05-08 DIAGNOSIS — J45901 Unspecified asthma with (acute) exacerbation: Secondary | ICD-10-CM | POA: Diagnosis not present

## 2020-05-08 DIAGNOSIS — G4733 Obstructive sleep apnea (adult) (pediatric): Secondary | ICD-10-CM | POA: Diagnosis not present

## 2020-05-08 DIAGNOSIS — R0602 Shortness of breath: Secondary | ICD-10-CM | POA: Diagnosis not present

## 2020-05-08 NOTE — Patient Instructions (Signed)
You were seen today by Lauraine Rinne, NP  for:   1. Shortness of breath 2. Abnormal findings on diagnostic imaging of lung  - CT Chest Wo Contrast; Future  Your pulmonary function test shows that you are having difficulty taking a full deep breath in.  This could be related to your BMI being 42 or this could be related to the inflammation that we saw on your CT imaging in April/2021.  We will repeat a CT to ensure that this has cleared.  For right now would like for you to increase your physical activity the best that you are able to.  Continue to monitor your oxygen levels  3. Asthma, chronic  Dulera 100 >>> 2 puffs in the morning right when you wake up, rinse out your mouth after use, 12 hours later 2 puffs, rinse after use >>> Take this daily, no matter what >>> This is not a rescue inhaler     4. OSA (obstructive sleep apnea)  We recommend that you continue using your CPAP daily >>>Keep up the hard work using your device >>> Goal should be wearing this for the entire night that you are sleeping, at least 4 to 6 hours  Remember:  . Do not drive or operate heavy machinery if tired or drowsy.  . Please notify the supply company and office if you are unable to use your device regularly due to missing supplies or machine being broken.  . Work on maintaining a healthy weight and following your recommended nutrition plan  . Maintain proper daily exercise and movement  . Maintaining proper use of your device can also help improve management of other chronic illnesses such as: Blood pressure, blood sugars, and weight management.   BiPAP/ CPAP Cleaning:  >>>Clean weekly, with Dawn soap, and bottle brush.  Set up to air dry. >>> Wipe mask out daily with wet wipe or towelette    We recommend today:  Orders Placed This Encounter  Procedures  . CT Chest Wo Contrast    Complete within next 1-2 weeks    Standing Status:   Future    Standing Expiration Date:   05/08/2021    Order  Specific Question:   Preferred imaging location?    Answer:   [place]    Order Specific Question:   Radiology Contrast Protocol - do NOT remove file path    Answer:   [email or web address]   Orders Placed This Encounter  Procedures  . CT Chest Wo Contrast   No orders of the defined types were placed in this encounter.   Follow Up:    Return in about 2 months (around 07/09/2020), or if symptoms worsen or fail to improve, for Follow up with Dr. Ander Slade.   Please do your part to reduce the spread of COVID-19:      Reduce your risk of any infection  and COVID19 by using the similar precautions used for avoiding the common cold or flu:  Marland Kitchen Wash your hands often with soap and warm water for at least 20 seconds.  If soap and water are not readily available, use an alcohol-based hand sanitizer with at least 60% alcohol.  . If coughing or sneezing, cover your mouth and nose by coughing or sneezing into the elbow areas of your shirt or coat, into a tissue or into your sleeve (not your hands). Langley Gauss A MASK when in public  . Avoid shaking hands with others and consider head nods or  verbal greetings only. . Avoid touching your eyes, nose, or mouth with unwashed hands.  . Avoid close contact with people who are sick. . Avoid places or events with large numbers of people in one location, like concerts or sporting events. . If you have some symptoms but not all symptoms, continue to monitor at home and seek medical attention if your symptoms worsen. . If you are having a medical emergency, call 911.   [place] / e-Visit: [email or web address]         [facility]: [facility]: [phone]                   [facility]: [phone]     It is flu season:   >>> Best ways to protect herself from the  flu: Receive the yearly flu vaccine, practice good hand hygiene washing with soap and also using hand sanitizer when available, eat a nutritious meals, get adequate rest, hydrate appropriately   Please contact the office if your symptoms worsen or you have concerns that you are not improving.   Thank you for choosing [facility] for your healthcare, and for allowing [place] to partner with you on your healthcare journey. I am thankful to be able to provide care to you today.   Wyn Quaker FNP-C

## 2020-05-08 NOTE — Assessment & Plan Note (Signed)
Chest x-ray never completed from May/2021 office visit CTA chest shows multiple groundglass opacities from [place]: CT chest ordered to compare to April/2021 to ensure resolution of GGO

## 2020-05-08 NOTE — Progress Notes (Signed)
Reviewed today with patient during televisit.Nothing further needed.Wyn Quaker, FNP

## 2020-05-08 NOTE — Assessment & Plan Note (Signed)
Suspect the shortness of breath is likely multifactorial as patient has morbid obesity, diastolic heart failure and asthma.  Pulmonary function testing today showing likely restrictive lung disease.  Patient now is adequately treated severe obstructive sleep apnea  Plan: We will continue to clinically monitor Continue Dulera 100 Work on increasing your overall physical activity to work to reduce your BMI Continue follow-up with cardiology CT chest ordered to compare to April/2021 CT chest

## 2020-05-08 NOTE — Assessment & Plan Note (Signed)
Plan: Continue Dulera 100

## 2020-05-08 NOTE — Assessment & Plan Note (Signed)
Excellent CPAP compliance report Tolerating CPAP therapy well Well-controlled AHI 1.7  Plan: Continue CPAP therapy Tolerating well 2 to 59-month follow-up with Dr. Jenetta Downer

## 2020-05-15 NOTE — Progress Notes (Signed)
Radiation Oncology         [phone] ________________________________  Initial Outpatient Consultation  Name: Bridget Lamb MRN: [id number]  Date: 05/19/2020  DOB: [birth year]  BW:LSLHTDS, Domingo Pulse, PA-C  Lafonda Mosses, MD   REFERRING PHYSICIAN: Lafonda Mosses, MD  DIAGNOSIS: The encounter diagnosis was Endometrial cancer [facility]).  Recurrent FIGO stage IA (cT1a, cN0, cM0) endometrial adenocarcinoma, grade 1  HISTORY OF PRESENT ILLNESS::Bridget Lamb is a 64 y.o. female who is seen as a courtesy of Dr. Berline Lopes for an opinion concerning radiation therapy as part of management for her recurrent endometrial cancer. Today, she is accompanied by no one. The patient initially presented to Mattie Marlin, PA-C, in October of 2020 for postmenopausal bleeding. Pelvic ultrasound on 09/04/2019 showed an anteverted uterus that measured 9.2 x 4.6 x 4.2 cm with thickened endometrial lining that measured 2.14 cm and was very hypervascular. On 09/19/2019, the patient underwent an endometrial biopsy with uterus sounding to 9 cm. Final pathology showed endometrial adenocarcinoma with clear cell features.   The patient was referred to Dr. Berline Lopes and was seen in consultation on 10/12/2019. At that time, it was recommended that the patient proceed with CT of abdomen and pelvis followed by surgical staging with total hysterectomy, bilateral salpingo-oophorectomy, and lymph node assessment.  CT of abdomen and pelvis on 10/16/2019 showed mild endometrial thickening. There was no evidence of metastatic disease.  The patient underwent a robotic-assisted laparoscopic total hysterectomy with bilateral salpingo-oophorectomy, sentinel lymph node biopsy, and repair of right sulcal vaginal tear on 10/25/2019 that was performed by Dr. Berline Lopes. Pathology from the procedure revealed FIGO grade I endometrioid adenocarcinoma of the uterus that spanned 3.5 cm. The tumor invaded less than one half of the  myometrium. The serosa was unremarkable. The cervix showed benign squamous and endocervical mucosa without dysplasia or malignancy. Bilateral ovaries were unremarkable. Bilateral fallopian tubes showed paratubal cysts without malignancy. One right and right left external iliac sentinel lymph node was biopsied, both of which were negative for carcinoma.  The patient was seen in follow-up with Dr. Berline Lopes on 10/30/2019. Given that she had low-risk disease, sentinel lymph nodes were negative for metastatic disease, and there was no lymphovascular invasion present, the above surgery was considered definitive treatment and the patient was placed under close observation.  Of note, the patient was seen in the ED on 02/20/2020 with chief complaint of shortness of breath. Chest x-ray at that time showed an ill-defined airspace opacity in the lateral let base that was concerning for pneumonia. CTA of chest showed patchy ground-glass and consolidative opacities throughout the aerated lungs bilaterally representing edema or infection. Additionally, there were scattered areas of consolidation within the lungs bilaterally that may have been atelectasis versus infection. Finally, there were moderate bilateral pleural effusions, prominent mediastinal lymph nodes that were likely reactive, a dilated main pulmonary artery that can be seen with pulmonary arterial hypertension, and aortic atherosclerosis. The patient was admitted to the hospital for further management of acute pulmonary edema, was stabilized, and was discharged home on 02/26/2020.  The patient underwent a bilateral screening mammogram on 04/16/2020 that did not show any mammographic evidence of malignancy.  The patient was seen by Dr. Berline Lopes on 05/01/2020 with chief complaint of vaginal bleeding in the setting of low-risk early-stage uterine cancer. On examination, the patient was noted to have a small amount of blood in the vaginal vault. The apex was narrowed  centrally and there was some very friable frondular-appearing tissue along the  midportion of the cuff. A vaginal cuff mass biopsy was taken, which showed adenocarcinoma.   The patient was seen in the ED again on 05/05/2020 for evaluation of abnormal creatinine. CT of abdomen and pelvis at that time did not show any acute intra-abdominal or pelvic pathology. Dr. Denman George was consulted and agreed that the patient was appropriate for outpatient management for further evaluation.  PREVIOUS RADIATION THERAPY: No  PAST MEDICAL HISTORY:  Past Medical History:  Diagnosis Date  . Allergic rhinitis   . Anemia    hematology, Dr. Julien Nordmann, last 01/2011  . Asthma   . CHF (congestive heart failure) ([place]) 2004  . Chronic kidney disease    protein in urine  . Complication of anesthesia   . Dyslipidemia   . Dyspnea    on excertion  . Endometrial cancer ([place])   . H/O echocardiogram 01/07/04   mild LVH, nl LV systolic function, EF 68%, left atrial enlargement, mildly thickened aortic and mitral valves  . History of thyroid cancer   . Hypertension   . Incarcerated ventral hernia 10/07/2013  . Obesity, Class III, BMI 40-49.9 (morbid obesity) ([place]) 10/09/2013  . Osteopenia   . PONV (postoperative nausea and vomiting)   . Sleep apnea   . Umbilical hernia   . Unspecified essential hypertension 10/09/2013  . Unspecified hypothyroidism 10/09/2013    PAST SURGICAL HISTORY: Past Surgical History:  Procedure Laterality Date  . BONE MARROW BIOPSY    . CARDIAC CATHETERIZATION  2005  . CHOLECYSTECTOMY  2003  . COLONOSCOPY  2008  . ROBOTIC ASSISTED TOTAL HYSTERECTOMY WITH BILATERAL SALPINGO OOPHERECTOMY N/A 10/25/2019   Procedure: XI ROBOTIC ASSISTED TOTAL HYSTERECTOMY WITH BILATERAL SALPINGO OOPHORECTOMY;  Surgeon: Lafonda Mosses, MD;  Location: [facility];  Service: Gynecology;  Laterality: N/A;  . SENTINEL NODE BIOPSY N/A 10/25/2019   Procedure: SENTINEL LYMPH NODE BIOPSY;  Surgeon: Lafonda Mosses,  MD;  Location: [facility];  Service: Gynecology;  Laterality: N/A;  . THYROID SURGERY     partial thyroidectomy  . TOTAL THYROIDECTOMY  2009  . VENTRAL HERNIA REPAIR N/A 10/07/2013   Procedure: HERNIA REPAIR VENTRAL ADULT;  Surgeon: Gwenyth Ober, MD;  Location: [place];  Service: General;  Laterality: N/A;    FAMILY HISTORY:  Family History  Problem Relation Age of Onset  . Hypertension Mother   . Pancreatic cancer Mother   . Breast cancer Maternal Aunt   . Lung cancer Father   . Ovarian cancer Neg Hx   . Endometrial cancer Neg Hx   . Colon cancer Neg Hx     SOCIAL HISTORY:  Social History   Tobacco Use  . Smoking status: Never Smoker  . Smokeless tobacco: Never Used  Vaping Use  . Vaping Use: Never used  Substance Use Topics  . Alcohol use: No  . Drug use: No    ALLERGIES: No Known Allergies  MEDICATIONS:  Current Outpatient Medications  Medication Sig Dispense Refill  . albuterol (PROVENTIL HFA;VENTOLIN HFA) 108 (90 Base) MCG/ACT inhaler Inhale 2 puffs into the lungs every 4 (four) hours as needed for wheezing or shortness of breath. 1 Inhaler 0  . aspirin EC 81 MG tablet Take 81 mg by mouth daily.    . Calcium Carbonate-Vitamin D (CALCIUM 600+D PO) Take 1 tablet by mouth daily.    . DULERA 100-5 MCG/ACT AERO Inhale 2 puffs into the lungs in the morning and at bedtime.     . FeFum-FePoly-FA-B Cmp-C-Biot (INTEGRA PLUS) CAPS Take 1 capsule  by mouth daily.    Marland Kitchen levothyroxine (SYNTHROID, LEVOTHROID) 150 MCG tablet Take 150 mcg by mouth daily before breakfast.     . loratadine (CLARITIN) 10 MG tablet Take 10 mg by mouth daily.    Marland Kitchen olopatadine (PATANOL) 0.1 % ophthalmic solution Place 1 drop into both eyes 2 (two) times daily.     . rosuvastatin (CRESTOR) 10 MG tablet Take 10 mg by mouth at bedtime.    . senna-docusate (SENOKOT-S) 8.6-50 MG tablet Take 1 tablet by mouth 2 (two) times daily as needed for moderate constipation.    . torsemide (DEMADEX) 20 MG tablet Take 1 tablet  (20 mg total) by mouth daily. Take extra one tab for leg swelling 90 tablet 2  . lisinopril (ZESTRIL) 10 MG tablet Take 1 tablet (10 mg total) by mouth daily. (Patient not taking: Reported on 05/19/2020)    . metoprolol succinate (TOPROL-XL) 25 MG 24 hr tablet Take 1 tablet (25 mg total) by mouth daily. Take with or immediately following a meal. 30 tablet 2   No current facility-administered medications for this encounter.    REVIEW OF SYSTEMS:  A 10+ POINT REVIEW OF SYSTEMS WAS OBTAINED including neurology, dermatology, psychiatry, cardiac, respiratory, lymph, extremities, GI, GU, musculoskeletal, constitutional, reproductive, HEENT.  She reports ongoing vaginal bleeding, minimal at this time.  She uses 1-2 pads for this issue.  She denies any pelvic pain.  She does have some problems with constipation   PHYSICAL EXAM:  height is 5' 4" (1.626 m) and weight is 247 lb 6.4 oz (112.2 kg) (abnormal). Her temperature is 98.6 F (37 C). Her blood pressure is 130/62 (abnormal) and her pulse is 72. Her respiration is 20 and oxygen saturation is 100%.   General: Alert and oriented, in no acute distress HEENT: Head is normocephalic. Extraocular movements are intact.  Neck: Neck is supple, no palpable cervical or supraclavicular lymphadenopathy. Heart: Regular in rate and rhythm with no murmurs, rubs, or gallops. Chest: Clear to auscultation bilaterally, with no rhonchi, wheezes, or rales. Abdomen: Soft, nontender, nondistended, with no rigidity or guarding. Extremities: No cyanosis or edema. Lymphatics: see Neck Exam Skin: No concerning lesions. Musculoskeletal: symmetric strength and muscle tone throughout. Neurologic: Cranial nerves II through XII are grossly intact. No obvious focalities. Speech is fluent. Coordination is intact. Psychiatric: Judgment and insight are intact. Affect is appropriate. On pelvic examination the external genitalia were unremarkable. A speculum exam was performed.  Patient  has significant hard stool in the rectal vault which compromises exam somewhat.  It is difficult to see the vaginal cuff in light of this issue but on palpation the patient has a palpable mass at the vaginal cuff which bleeds easily with examination  ECOG = 1  0 - Asymptomatic (Fully active, able to carry on all predisease activities without restriction)  1 - Symptomatic but completely ambulatory (Restricted in physically strenuous activity but ambulatory and able to carry out work of a light or sedentary nature. For example, light housework, office work)  2 - Symptomatic, <50% in bed during the day (Ambulatory and capable of all self care but unable to carry out any work activities. Up and about more than 50% of waking hours)  3 - Symptomatic, >50% in bed, but not bedbound (Capable of only limited self-care, confined to bed or chair 50% or more of waking hours)  4 - Bedbound (Completely disabled. Cannot carry on any self-care. Totally confined to bed or chair)  5 - Death   Eustace Pen  MM, Creech RH, Tormey DC, et al. [phone]). "Toxicity and response criteria of the [facility]". [place] Oncol. 5 (6): 649-55  LABORATORY DATA:  Lab Results  Component Value Date   WBC 9.2 05/05/2020   HGB 8.8 (L) 05/05/2020   HCT 27.3 (L) 05/05/2020   MCV 96.8 05/05/2020   PLT 190 05/05/2020   NEUTROABS 7.2 05/05/2020   Lab Results  Component Value Date   NA 139 05/05/2020   K 5.2 (H) 05/05/2020   CL 102 05/05/2020   CO2 27 05/05/2020   GLUCOSE 95 05/05/2020   CREATININE 1.93 (H) 05/05/2020   CALCIUM 9.7 05/05/2020      RADIOGRAPHY: CT Abdomen Pelvis Wo Contrast  Result Date: 05/05/2020 CLINICAL DATA:  High potassium and creatinine history of uterine cancer EXAM: CT ABDOMEN AND PELVIS WITHOUT CONTRAST TECHNIQUE: Multidetector CT imaging of the abdomen and pelvis was performed following the standard protocol without IV contrast. COMPARISON:  October 16, 2019 FINDINGS: Lower  chest: The visualized heart size within normal limits. No pericardial fluid/thickening. No hiatal hernia. The visualized portions of the lungs are clear. Hepatobiliary: Although limited due to the lack of intravenous contrast, normal in appearance without gross focal abnormality. The patient is status post cholecystectomy. No biliary ductal dilation. Pancreas:  Unremarkable.  No surrounding inflammatory changes. Spleen: Normal in size. Although limited due to the lack of intravenous contrast, normal in appearance. Adrenals/Urinary Tract: Both adrenal glands appear normal. The kidneys and collecting system appear normal without evidence of urinary tract calculus or hydronephrosis. Bladder is unremarkable. Stomach/Bowel: The stomach, small bowel, and colon are normal in appearance. No inflammatory changes or obstructive findings. Scattered colonic diverticula are noted without diverticulitis. Vascular/Lymphatic: There are no enlarged abdominal or pelvic lymph nodes. Scattered aortic atherosclerotic calcifications are seen without aneurysmal dilatation. Reproductive: The patient is status post hysterectomy. Other: No evidence of abdominal wall mass or hernia. Musculoskeletal: No acute or significant osseous findings. Mild degenerative changes in the lower lumbar spine. IMPRESSION: No acute intra-abdominal or pelvic pathology to explain the patient's symptoms. Aortic Atherosclerosis (ICD10-I70.0). Electronically Signed   By: Prudencio Pair M.D.   On: 05/05/2020 16:57      IMPRESSION: Recurrent FIGO stage IA (cT1a, cN0, cM0) endometrial adenocarcinoma, grade 1  Patient will be a good candidate for definitive course of radiation therapy.  This would include  a combination of 5 weeks of external beam radiation therapy followed by 4 intracavitary brachytherapy treatments directed at the vaginal cuff mass.   Today, I talked to the patient and about the findings and work-up thus far.  We discussed the natural history of  recurrent endometrial cancer and general treatment, highlighting the role of radiotherapy in the management.  We discussed the available radiation techniques, and focused on the details of logistics and delivery.  We reviewed the anticipated acute and late sequelae associated with radiation in this setting.  The patient was encouraged to ask questions that I answered to the best of my ability.  A patient consent form was discussed and signed.  We retained a copy for our records.  The patient would like to proceed with radiation and will be scheduled for CT simulation.  PLAN: The patient will proceed with CT simulation later today with treatments to begin in approximately a week.  Anticipate 5 weeks of external beam radiation therapy directed to the pelvis region.  IMRT will be used to limit dose to the small bowel and surrounding pelvic bone marrow.  After  completion of external beam radiation therapy the patient will proceed with for brachytherapy treatments directed at the residual vaginal cuff mass.  Total time spent in this encounter was 65 minutes which included reviewing the patient's most recent consultations, imaging (chest x-rays, CT scans), ED visits, hospitalization, surgery/biopsies, pathology reports, follow-ups, physical examination, and documentation.    ------------------------------------------------  Blair Promise, PhD, MD  This document serves as a record of services personally performed by Gery Pray, MD. It was created on his behalf by Clerance Lav, a trained medical scribe. The creation of this record is based on the scribe's personal observations and the provider's statements to them. This document has been checked and approved by the attending provider.

## 2020-05-16 ENCOUNTER — Other Ambulatory Visit: Payer: BC Managed Care – PPO

## 2020-05-16 NOTE — Progress Notes (Signed)
Patient here for a consult with Dr. Sondra Come. REFERRING PHYSICIAN: Lafonda Mosses, MD  DIAGNOSIS: There were no encounter diagnoses.    HISTORY OF PRESENT ILLNESS:   PREVIOUS RADIATION THERAPY:no PAST MEDICAL HISTORY:      Past Medical History:  Diagnosis Date  . Allergic rhinitis   . Anemia    hematology, Dr. Julien Nordmann, last 01/2011  . Asthma   . CHF (congestive heart failure) ([place]) 2004  . Chronic kidney disease    protein in urine  . Complication of anesthesia   . Dyslipidemia   . Dyspnea    on excertion  . Endometrial cancer ([place])   . H/O echocardiogram 01/07/04   mild LVH, nl LV systolic function, EF 84%, left atrial enlargement, mildly thickened aortic and mitral valves  . History of thyroid cancer   . Hypertension   . Incarcerated ventral hernia 10/07/2013  . Obesity, Class III, BMI 40-49.9 (morbid obesity) ([place]) 10/09/2013  . Osteopenia   . PONV (postoperative nausea and vomiting)   . Sleep apnea   . Umbilical hernia   . Unspecified essential hypertension 10/09/2013  . Unspecified hypothyroidism 10/09/2013    PAST SURGICAL HISTORY:      Past Surgical History:  Procedure Laterality Date  . BONE MARROW BIOPSY    . CARDIAC CATHETERIZATION  2005  . CHOLECYSTECTOMY  2003  . COLONOSCOPY  2008  . ROBOTIC ASSISTED TOTAL HYSTERECTOMY WITH BILATERAL SALPINGO OOPHERECTOMY N/A 10/25/2019   Procedure: XI ROBOTIC ASSISTED TOTAL HYSTERECTOMY WITH BILATERAL SALPINGO OOPHORECTOMY;  Surgeon: Lafonda Mosses, MD;  [facility];  Service: Gynecology;  Laterality: N/A;  . SENTINEL NODE BIOPSY N/A 10/25/2019   Procedure: SENTINEL LYMPH NODE BIOPSY;  Surgeon: Lafonda Mosses, MD;  [facility];  Service: Gynecology;  Laterality: N/A;  . THYROID SURGERY     partial thyroidectomy  . TOTAL THYROIDECTOMY  2009  . VENTRAL HERNIA REPAIR N/A 10/07/2013   Procedure: HERNIA REPAIR VENTRAL ADULT;  Surgeon: Gwenyth Ober, MD;   Location: [facility];  Service: General;  Laterality: N/A;      Past/Anticipated interventions by Gyn/Onc surgery, if any: Hysterectomy 10/25/19  Past/Anticipated interventions by medical oncology, if any: no  Weight changes, if any: no  Bowel/Bladder complaints, if any: Nausea/Vomiting, if any: no  Pain issues, if any: 0  SAFETY ISSUES:  Prior radiation? no  Pacemaker/ICD? no  Possible current pregnancy? Hysterectomy 09/2020  Is the patient on methotrexate? no  Current Complaints / other details:  Had pelvic exam 05/02/2020. Reports dark pink vaginal discharge.  BP (!) 130/62 (BP Location: Left Arm, Patient Position: Sitting, Cuff Size: Large)   Pulse 72   Temp 98.6 F (37 C)   Resp 20   Ht '5\' 4"'$  (1.626 m)   Wt (!) 247 lb 6.4 oz (112.2 kg)   LMP 10/22/2011   SpO2 100%   BMI 42.47 kg/m    Wt Readings from Last 3 Encounters:  05/19/20 (!) 247 lb 6.4 oz (112.2 kg)  05/05/20 248 lb (112.5 kg)  05/01/20 248 lb (112.5 kg)

## 2020-05-19 ENCOUNTER — Other Ambulatory Visit: Payer: Self-pay

## 2020-05-19 ENCOUNTER — Encounter: Payer: Self-pay | Admitting: Radiation Oncology

## 2020-05-19 ENCOUNTER — Ambulatory Visit
Admission: RE | Admit: 2020-05-19 | Discharge: 2020-05-19 | Disposition: A | Discharge status: Home / Self Care | Payer: BC Managed Care – PPO | Source: Ambulatory Visit | Attending: Radiation Oncology | Admitting: Radiation Oncology

## 2020-05-19 ENCOUNTER — Encounter: Payer: Self-pay | Admitting: *Deleted

## 2020-05-19 DIAGNOSIS — Z7982 Long term (current) use of aspirin: Secondary | ICD-10-CM | POA: Diagnosis not present

## 2020-05-19 DIAGNOSIS — Z510 Encounter for antineoplastic radiation therapy: Secondary | ICD-10-CM | POA: Insufficient documentation

## 2020-05-19 DIAGNOSIS — Z90722 Acquired absence of ovaries, bilateral: Secondary | ICD-10-CM | POA: Insufficient documentation

## 2020-05-19 DIAGNOSIS — Z90710 Acquired absence of both cervix and uterus: Secondary | ICD-10-CM | POA: Diagnosis not present

## 2020-05-19 DIAGNOSIS — J90 Pleural effusion, not elsewhere classified: Secondary | ICD-10-CM | POA: Insufficient documentation

## 2020-05-19 DIAGNOSIS — C541 Malignant neoplasm of endometrium: Secondary | ICD-10-CM | POA: Diagnosis present

## 2020-05-19 DIAGNOSIS — Z800 Family history of malignant neoplasm of digestive organs: Secondary | ICD-10-CM | POA: Insufficient documentation

## 2020-05-19 DIAGNOSIS — Z9049 Acquired absence of other specified parts of digestive tract: Secondary | ICD-10-CM | POA: Diagnosis not present

## 2020-05-19 DIAGNOSIS — R918 Other nonspecific abnormal finding of lung field: Secondary | ICD-10-CM | POA: Insufficient documentation

## 2020-05-19 DIAGNOSIS — I2721 Secondary pulmonary arterial hypertension: Secondary | ICD-10-CM | POA: Insufficient documentation

## 2020-05-19 DIAGNOSIS — Z803 Family history of malignant neoplasm of breast: Secondary | ICD-10-CM | POA: Diagnosis not present

## 2020-05-19 DIAGNOSIS — M47816 Spondylosis without myelopathy or radiculopathy, lumbar region: Secondary | ICD-10-CM | POA: Diagnosis not present

## 2020-05-19 DIAGNOSIS — J45909 Unspecified asthma, uncomplicated: Secondary | ICD-10-CM | POA: Insufficient documentation

## 2020-05-19 DIAGNOSIS — E039 Hypothyroidism, unspecified: Secondary | ICD-10-CM | POA: Diagnosis not present

## 2020-05-19 DIAGNOSIS — Z85850 Personal history of malignant neoplasm of thyroid: Secondary | ICD-10-CM | POA: Insufficient documentation

## 2020-05-19 DIAGNOSIS — R944 Abnormal results of kidney function studies: Secondary | ICD-10-CM | POA: Insufficient documentation

## 2020-05-19 DIAGNOSIS — Z79899 Other long term (current) drug therapy: Secondary | ICD-10-CM | POA: Diagnosis not present

## 2020-05-19 DIAGNOSIS — I130 Hypertensive heart and chronic kidney disease with heart failure and stage 1 through stage 4 chronic kidney disease, or unspecified chronic kidney disease: Secondary | ICD-10-CM | POA: Diagnosis not present

## 2020-05-19 DIAGNOSIS — E785 Hyperlipidemia, unspecified: Secondary | ICD-10-CM | POA: Insufficient documentation

## 2020-05-19 DIAGNOSIS — I700 Atherosclerosis of aorta: Secondary | ICD-10-CM | POA: Diagnosis not present

## 2020-05-19 DIAGNOSIS — I509 Heart failure, unspecified: Secondary | ICD-10-CM | POA: Insufficient documentation

## 2020-05-19 DIAGNOSIS — G4730 Sleep apnea, unspecified: Secondary | ICD-10-CM | POA: Diagnosis not present

## 2020-05-19 DIAGNOSIS — M8580 Other specified disorders of bone density and structure, unspecified site: Secondary | ICD-10-CM | POA: Diagnosis not present

## 2020-05-19 NOTE — Progress Notes (Signed)
[place] Psychosocial Distress Screening Clinical Social Work  Clinical Social Work was referred by distress screening protocol.  The patient scored a 7 on the Psychosocial Distress Thermometer which indicates moderate distress. Patient declined follow up by Clinical Education officer, museum.   ONCBCN DISTRESS SCREENING 05/19/2020  Distress experienced in past week (1-10) 7  Referral to clinical social work No  Other declined social work referral    Johnnye Lana, MSW, [place], OSW-C Clinical Social Worker [employer] [phone]

## 2020-05-20 ENCOUNTER — Ambulatory Visit (INDEPENDENT_AMBULATORY_CARE_PROVIDER_SITE_OTHER)
Admission: RE | Admit: 2020-05-20 | Discharge: 2020-05-20 | Disposition: A | Discharge status: Home / Self Care | Payer: BC Managed Care – PPO | Source: Ambulatory Visit | Attending: Pulmonary Disease | Admitting: Pulmonary Disease

## 2020-05-20 DIAGNOSIS — R918 Other nonspecific abnormal finding of lung field: Secondary | ICD-10-CM | POA: Diagnosis not present

## 2020-05-20 NOTE — Telephone Encounter (Signed)
PFT/Feno performed

## 2020-05-21 LAB — BASIC METABOLIC PANEL
BUN/Creatinine Ratio: 21 (ref 12–28)
BUN: 31 mg/dL — ABNORMAL HIGH (ref 8–27)
CO2: 26 mmol/L (ref 20–29)
Calcium: 10 mg/dL (ref 8.7–10.3)
Chloride: 99 mmol/L (ref 96–106)
Creatinine, Ser: 1.49 mg/dL — ABNORMAL HIGH (ref 0.57–1.00)
GFR calc Af Amer: 42 mL/min/{1.73_m2} — ABNORMAL LOW (ref >59)
GFR calc non Af Amer: 37 mL/min/{1.73_m2} — ABNORMAL LOW (ref >59)
Glucose: 101 mg/dL — ABNORMAL HIGH (ref 65–99)
Potassium: 5.2 mmol/L (ref 3.5–5.2)
Sodium: 140 mmol/L (ref 134–144)

## 2020-05-21 LAB — BRAIN NATRIURETIC PEPTIDE: BNP: 82.7 pg/mL (ref 0.0–100.0)

## 2020-05-21 NOTE — Progress Notes (Signed)
Spoke with pt and notified of results per Wyn Quaker. Pt verbalized understanding and denied any questions. Copy to Roe Coombs and Dr Nadyne Coombes.

## 2020-05-22 ENCOUNTER — Telehealth: Payer: Self-pay | Admitting: Radiation Oncology

## 2020-05-22 NOTE — Telephone Encounter (Signed)
Received request for medical information to support disability via fax. Placed request in bin for [facility].

## 2020-05-24 DIAGNOSIS — C541 Malignant neoplasm of endometrium: Secondary | ICD-10-CM | POA: Diagnosis not present

## 2020-05-27 ENCOUNTER — Ambulatory Visit: Payer: BC Managed Care – PPO | Admitting: Radiation Oncology

## 2020-05-28 ENCOUNTER — Ambulatory Visit: Payer: BC Managed Care – PPO

## 2020-05-28 ENCOUNTER — Other Ambulatory Visit: Payer: Self-pay

## 2020-05-28 ENCOUNTER — Ambulatory Visit
Admission: RE | Admit: 2020-05-28 | Discharge: 2020-05-28 | Disposition: A | Discharge status: Home / Self Care | Payer: BC Managed Care – PPO | Source: Ambulatory Visit | Attending: Radiation Oncology | Admitting: Radiation Oncology

## 2020-05-28 DIAGNOSIS — Z510 Encounter for antineoplastic radiation therapy: Secondary | ICD-10-CM | POA: Insufficient documentation

## 2020-05-28 DIAGNOSIS — C541 Malignant neoplasm of endometrium: Secondary | ICD-10-CM | POA: Diagnosis not present

## 2020-05-29 ENCOUNTER — Ambulatory Visit
Admission: RE | Admit: 2020-05-29 | Discharge: 2020-05-29 | Disposition: A | Discharge status: Home / Self Care | Payer: BC Managed Care – PPO | Source: Ambulatory Visit | Attending: Radiation Oncology | Admitting: Radiation Oncology

## 2020-05-29 ENCOUNTER — Other Ambulatory Visit: Payer: Self-pay

## 2020-05-29 DIAGNOSIS — C541 Malignant neoplasm of endometrium: Secondary | ICD-10-CM | POA: Diagnosis not present

## 2020-05-30 ENCOUNTER — Ambulatory Visit
Admission: RE | Admit: 2020-05-30 | Discharge: 2020-05-30 | Disposition: A | Discharge status: Home / Self Care | Payer: BC Managed Care – PPO | Source: Ambulatory Visit | Attending: Radiation Oncology | Admitting: Radiation Oncology

## 2020-05-30 DIAGNOSIS — C541 Malignant neoplasm of endometrium: Secondary | ICD-10-CM | POA: Diagnosis not present

## 2020-06-01 ENCOUNTER — Other Ambulatory Visit: Payer: Self-pay | Admitting: Cardiology

## 2020-06-01 DIAGNOSIS — I10 Essential (primary) hypertension: Secondary | ICD-10-CM

## 2020-06-02 ENCOUNTER — Other Ambulatory Visit: Payer: Self-pay

## 2020-06-02 ENCOUNTER — Ambulatory Visit
Admission: RE | Admit: 2020-06-02 | Discharge: 2020-06-02 | Disposition: A | Discharge status: Home / Self Care | Payer: BC Managed Care – PPO | Source: Ambulatory Visit | Attending: Radiation Oncology | Admitting: Radiation Oncology

## 2020-06-02 DIAGNOSIS — C541 Malignant neoplasm of endometrium: Secondary | ICD-10-CM | POA: Diagnosis not present

## 2020-06-03 ENCOUNTER — Ambulatory Visit
Admission: RE | Admit: 2020-06-03 | Discharge: 2020-06-03 | Disposition: A | Discharge status: Home / Self Care | Payer: BC Managed Care – PPO | Source: Ambulatory Visit | Attending: Radiation Oncology | Admitting: Radiation Oncology

## 2020-06-03 ENCOUNTER — Other Ambulatory Visit: Payer: Self-pay

## 2020-06-03 DIAGNOSIS — C541 Malignant neoplasm of endometrium: Secondary | ICD-10-CM | POA: Diagnosis not present

## 2020-06-04 ENCOUNTER — Ambulatory Visit
Admission: RE | Admit: 2020-06-04 | Discharge: 2020-06-04 | Disposition: A | Discharge status: Home / Self Care | Payer: BC Managed Care – PPO | Source: Ambulatory Visit | Attending: Radiation Oncology | Admitting: Radiation Oncology

## 2020-06-04 ENCOUNTER — Other Ambulatory Visit: Payer: Self-pay

## 2020-06-04 DIAGNOSIS — C541 Malignant neoplasm of endometrium: Secondary | ICD-10-CM | POA: Diagnosis not present

## 2020-06-05 ENCOUNTER — Telehealth: Payer: Self-pay | Admitting: Oncology

## 2020-06-05 ENCOUNTER — Ambulatory Visit
Admission: RE | Admit: 2020-06-05 | Discharge: 2020-06-05 | Disposition: A | Discharge status: Home / Self Care | Payer: BC Managed Care – PPO | Source: Ambulatory Visit | Attending: Radiation Oncology | Admitting: Radiation Oncology

## 2020-06-05 DIAGNOSIS — C541 Malignant neoplasm of endometrium: Secondary | ICD-10-CM | POA: Diagnosis not present

## 2020-06-05 NOTE — Telephone Encounter (Signed)
Bridget Lamb and scheduled follow up appointment with Dr. Berline Lopes on 07/09/20 at 2:00.  She verbalized understanding and agreement of appointment.

## 2020-06-06 ENCOUNTER — Ambulatory Visit
Admission: RE | Admit: 2020-06-06 | Discharge: 2020-06-06 | Disposition: A | Discharge status: Home / Self Care | Payer: BC Managed Care – PPO | Source: Ambulatory Visit | Attending: Radiation Oncology | Admitting: Radiation Oncology

## 2020-06-06 ENCOUNTER — Other Ambulatory Visit: Payer: Self-pay

## 2020-06-06 DIAGNOSIS — C541 Malignant neoplasm of endometrium: Secondary | ICD-10-CM | POA: Diagnosis not present

## 2020-06-09 ENCOUNTER — Ambulatory Visit
Admission: RE | Admit: 2020-06-09 | Discharge: 2020-06-09 | Disposition: A | Discharge status: Home / Self Care | Payer: BC Managed Care – PPO | Source: Ambulatory Visit | Attending: Radiation Oncology | Admitting: Radiation Oncology

## 2020-06-09 ENCOUNTER — Other Ambulatory Visit: Payer: Self-pay

## 2020-06-09 DIAGNOSIS — C541 Malignant neoplasm of endometrium: Secondary | ICD-10-CM | POA: Diagnosis not present

## 2020-06-10 ENCOUNTER — Ambulatory Visit
Admission: RE | Admit: 2020-06-10 | Discharge: 2020-06-10 | Disposition: A | Discharge status: Home / Self Care | Payer: BC Managed Care – PPO | Source: Ambulatory Visit | Attending: Radiation Oncology | Admitting: Radiation Oncology

## 2020-06-10 ENCOUNTER — Other Ambulatory Visit: Payer: Self-pay

## 2020-06-10 DIAGNOSIS — C541 Malignant neoplasm of endometrium: Secondary | ICD-10-CM | POA: Diagnosis not present

## 2020-06-11 ENCOUNTER — Other Ambulatory Visit: Payer: Self-pay

## 2020-06-11 ENCOUNTER — Telehealth: Payer: Self-pay | Admitting: *Deleted

## 2020-06-11 ENCOUNTER — Ambulatory Visit
Admission: RE | Admit: 2020-06-11 | Discharge: 2020-06-11 | Disposition: A | Discharge status: Home / Self Care | Payer: BC Managed Care – PPO | Source: Ambulatory Visit | Attending: Radiation Oncology | Admitting: Radiation Oncology

## 2020-06-11 ENCOUNTER — Ambulatory Visit: Payer: BC Managed Care – PPO

## 2020-06-11 DIAGNOSIS — C541 Malignant neoplasm of endometrium: Secondary | ICD-10-CM | POA: Diagnosis not present

## 2020-06-11 NOTE — Telephone Encounter (Signed)
Patient called and canceled her appt with Dr Berline Lopes on 9/15. Patient stated "Dr Sondra Come said I will have five specialized treatments that week. She will call back to reschedule once those appts are scheduled

## 2020-06-12 ENCOUNTER — Ambulatory Visit
Admission: RE | Admit: 2020-06-12 | Discharge: 2020-06-12 | Disposition: A | Discharge status: Home / Self Care | Payer: BC Managed Care – PPO | Source: Ambulatory Visit | Attending: Radiation Oncology | Admitting: Radiation Oncology

## 2020-06-12 ENCOUNTER — Other Ambulatory Visit: Payer: Self-pay

## 2020-06-12 DIAGNOSIS — C541 Malignant neoplasm of endometrium: Secondary | ICD-10-CM | POA: Diagnosis not present

## 2020-06-13 ENCOUNTER — Ambulatory Visit
Admission: RE | Admit: 2020-06-13 | Discharge: 2020-06-13 | Disposition: A | Discharge status: Home / Self Care | Payer: BC Managed Care – PPO | Source: Ambulatory Visit | Attending: Radiation Oncology | Admitting: Radiation Oncology

## 2020-06-13 ENCOUNTER — Other Ambulatory Visit: Payer: Self-pay

## 2020-06-13 DIAGNOSIS — C541 Malignant neoplasm of endometrium: Secondary | ICD-10-CM | POA: Diagnosis not present

## 2020-06-16 ENCOUNTER — Other Ambulatory Visit: Payer: Self-pay

## 2020-06-16 ENCOUNTER — Ambulatory Visit
Admission: RE | Admit: 2020-06-16 | Discharge: 2020-06-16 | Disposition: A | Discharge status: Home / Self Care | Payer: BC Managed Care – PPO | Source: Ambulatory Visit | Attending: Radiation Oncology | Admitting: Radiation Oncology

## 2020-06-16 DIAGNOSIS — C541 Malignant neoplasm of endometrium: Secondary | ICD-10-CM | POA: Diagnosis not present

## 2020-06-17 ENCOUNTER — Other Ambulatory Visit: Payer: Self-pay | Admitting: Radiation Oncology

## 2020-06-17 ENCOUNTER — Other Ambulatory Visit: Payer: Self-pay

## 2020-06-17 ENCOUNTER — Ambulatory Visit
Admission: RE | Admit: 2020-06-17 | Discharge: 2020-06-17 | Disposition: A | Discharge status: Home / Self Care | Payer: BC Managed Care – PPO | Source: Ambulatory Visit | Attending: Radiation Oncology | Admitting: Radiation Oncology

## 2020-06-17 DIAGNOSIS — C541 Malignant neoplasm of endometrium: Secondary | ICD-10-CM | POA: Diagnosis not present

## 2020-06-17 MED ORDER — ONDANSETRON HCL 4 MG PO TABS: 4.0000 mg | ORAL_TABLET | Freq: Three times a day (TID) | ORAL | 0 refills | Status: DC | PRN

## 2020-06-18 ENCOUNTER — Ambulatory Visit: Payer: BC Managed Care – PPO

## 2020-06-18 ENCOUNTER — Ambulatory Visit
Admission: RE | Admit: 2020-06-18 | Discharge: 2020-06-18 | Disposition: A | Discharge status: Home / Self Care | Payer: BC Managed Care – PPO | Source: Ambulatory Visit | Attending: Radiation Oncology | Admitting: Radiation Oncology

## 2020-06-18 DIAGNOSIS — C541 Malignant neoplasm of endometrium: Secondary | ICD-10-CM | POA: Diagnosis not present

## 2020-06-19 ENCOUNTER — Ambulatory Visit
Admission: RE | Admit: 2020-06-19 | Discharge: 2020-06-19 | Disposition: A | Discharge status: Home / Self Care | Payer: BC Managed Care – PPO | Source: Ambulatory Visit | Attending: Radiation Oncology | Admitting: Radiation Oncology

## 2020-06-19 ENCOUNTER — Other Ambulatory Visit: Payer: Self-pay

## 2020-06-19 DIAGNOSIS — C541 Malignant neoplasm of endometrium: Secondary | ICD-10-CM | POA: Diagnosis not present

## 2020-06-20 ENCOUNTER — Other Ambulatory Visit: Payer: Self-pay

## 2020-06-20 ENCOUNTER — Ambulatory Visit
Admission: RE | Admit: 2020-06-20 | Discharge: 2020-06-20 | Disposition: A | Discharge status: Home / Self Care | Payer: BC Managed Care – PPO | Source: Ambulatory Visit | Attending: Radiation Oncology | Admitting: Radiation Oncology

## 2020-06-20 DIAGNOSIS — C541 Malignant neoplasm of endometrium: Secondary | ICD-10-CM | POA: Diagnosis not present

## 2020-06-23 ENCOUNTER — Other Ambulatory Visit: Payer: Self-pay

## 2020-06-23 ENCOUNTER — Ambulatory Visit
Admission: RE | Admit: 2020-06-23 | Discharge: 2020-06-23 | Disposition: A | Discharge status: Home / Self Care | Payer: BC Managed Care – PPO | Source: Ambulatory Visit | Attending: Radiation Oncology | Admitting: Radiation Oncology

## 2020-06-23 ENCOUNTER — Telehealth: Payer: Self-pay | Admitting: *Deleted

## 2020-06-23 ENCOUNTER — Encounter: Payer: Self-pay | Admitting: Gynecologic Oncology

## 2020-06-23 DIAGNOSIS — C541 Malignant neoplasm of endometrium: Secondary | ICD-10-CM | POA: Diagnosis not present

## 2020-06-23 NOTE — Telephone Encounter (Signed)
Patient called and requested "I need a letter stating that I had cancer in December 2020. I that I was cancer free after surgery until my cancer recurrence in July 2021. It's for my critical care insurance policy.

## 2020-06-24 ENCOUNTER — Ambulatory Visit
Admission: RE | Admit: 2020-06-24 | Discharge: 2020-06-24 | Disposition: A | Discharge status: Home / Self Care | Payer: BC Managed Care – PPO | Source: Ambulatory Visit | Attending: Radiation Oncology | Admitting: Radiation Oncology

## 2020-06-24 ENCOUNTER — Other Ambulatory Visit: Payer: Self-pay

## 2020-06-24 DIAGNOSIS — C541 Malignant neoplasm of endometrium: Secondary | ICD-10-CM | POA: Diagnosis not present

## 2020-06-25 ENCOUNTER — Ambulatory Visit
Admission: RE | Admit: 2020-06-25 | Discharge: 2020-06-25 | Disposition: A | Discharge status: Home / Self Care | Payer: BC Managed Care – PPO | Source: Ambulatory Visit | Attending: Radiation Oncology | Admitting: Radiation Oncology

## 2020-06-25 ENCOUNTER — Ambulatory Visit: Payer: BC Managed Care – PPO

## 2020-06-25 ENCOUNTER — Other Ambulatory Visit: Payer: Self-pay

## 2020-06-25 ENCOUNTER — Telehealth: Payer: Self-pay | Admitting: *Deleted

## 2020-06-25 DIAGNOSIS — Z510 Encounter for antineoplastic radiation therapy: Secondary | ICD-10-CM | POA: Diagnosis not present

## 2020-06-25 DIAGNOSIS — C541 Malignant neoplasm of endometrium: Secondary | ICD-10-CM | POA: Diagnosis present

## 2020-06-25 NOTE — Telephone Encounter (Signed)
Called patient to inform of [facility], lvm for a return call 

## 2020-06-26 ENCOUNTER — Telehealth: Payer: Self-pay | Admitting: *Deleted

## 2020-06-26 ENCOUNTER — Ambulatory Visit
Admission: RE | Admit: 2020-06-26 | Discharge: 2020-06-26 | Disposition: A | Discharge status: Home / Self Care | Payer: BC Managed Care – PPO | Source: Ambulatory Visit | Attending: Radiation Oncology | Admitting: Radiation Oncology

## 2020-06-26 DIAGNOSIS — C541 Malignant neoplasm of endometrium: Secondary | ICD-10-CM | POA: Diagnosis not present

## 2020-06-26 NOTE — Telephone Encounter (Signed)
Pt called requesting pathology reports while she was here in the hospital. Path Reports printed and given to patient.

## 2020-06-27 ENCOUNTER — Ambulatory Visit
Admission: RE | Admit: 2020-06-27 | Discharge: 2020-06-27 | Disposition: A | Discharge status: Home / Self Care | Payer: BC Managed Care – PPO | Source: Ambulatory Visit | Attending: Radiation Oncology | Admitting: Radiation Oncology

## 2020-06-27 ENCOUNTER — Other Ambulatory Visit: Payer: Self-pay

## 2020-06-27 DIAGNOSIS — C541 Malignant neoplasm of endometrium: Secondary | ICD-10-CM | POA: Diagnosis not present

## 2020-06-29 ENCOUNTER — Other Ambulatory Visit: Payer: Self-pay | Admitting: Cardiology

## 2020-06-29 DIAGNOSIS — I10 Essential (primary) hypertension: Secondary | ICD-10-CM

## 2020-07-01 ENCOUNTER — Ambulatory Visit: Payer: BC Managed Care – PPO

## 2020-07-01 ENCOUNTER — Ambulatory Visit
Admission: RE | Admit: 2020-07-01 | Discharge: 2020-07-01 | Disposition: A | Discharge status: Home / Self Care | Payer: BC Managed Care – PPO | Source: Ambulatory Visit | Attending: Radiation Oncology | Admitting: Radiation Oncology

## 2020-07-01 ENCOUNTER — Other Ambulatory Visit: Payer: Self-pay

## 2020-07-01 DIAGNOSIS — C541 Malignant neoplasm of endometrium: Secondary | ICD-10-CM | POA: Diagnosis not present

## 2020-07-02 ENCOUNTER — Ambulatory Visit
Admission: RE | Admit: 2020-07-02 | Discharge: 2020-07-02 | Disposition: A | Discharge status: Home / Self Care | Payer: BC Managed Care – PPO | Source: Ambulatory Visit | Attending: Radiation Oncology | Admitting: Radiation Oncology

## 2020-07-02 ENCOUNTER — Telehealth: Payer: Self-pay | Admitting: *Deleted

## 2020-07-02 ENCOUNTER — Other Ambulatory Visit: Payer: Self-pay

## 2020-07-02 ENCOUNTER — Encounter: Payer: Self-pay | Admitting: Radiation Oncology

## 2020-07-02 DIAGNOSIS — C541 Malignant neoplasm of endometrium: Secondary | ICD-10-CM | POA: Diagnosis not present

## 2020-07-02 NOTE — Progress Notes (Signed)
  Radiation Oncology         [phone] ________________________________  Name: Bridget Lamb MRN: [id number]  Date: 07/03/2020  DOB: 1955-12-28  SIMULATION AND TREATMENT PLANNING NOTE HDR BRACHYTHERAPY  DIAGNOSIS: Recurrent FIGO stage IA (cT1a, cN0, cM0) endometrial adenocarcinoma, grade 1  NARRATIVE:  The patient was brought to the [facility].  Identity was confirmed.  All relevant records and images related to the planned course of therapy were reviewed.  The patient freely provided informed written consent to proceed with treatment after reviewing the details related to the planned course of therapy. The consent form was witnessed and verified by the simulation staff.  Then, the patient was set-up in a stable reproducible  supine position for radiation therapy.  CT images were obtained.  Surface markings were placed.  The CT images were loaded into the planning software.  Then the target and avoidance structures were contoured.  Treatment planning then occurred.  The radiation prescription was entered and confirmed.   I have requested : Brachytherapy Isodose Plan and Dosimetry Calculations to plan the radiation distribution.    PLAN:  The patient will receive 24 Gy in 4 fractions of 6 Gy each.  The patient will be treated with a 2.5 cm diameter cylinder with a treatment length of 3 cm.  Iridium 192 will be the high-dose-rate source.  ________________________________   Blair Promise, PhD, MD  This document serves as a record of services personally performed by Gery Pray, MD. It was created on his behalf by Clerance Lav, a trained medical scribe. The creation of this record is based on the scribe's personal observations and the provider's statements to them. This document has been checked and approved by the attending provider.

## 2020-07-02 NOTE — Progress Notes (Signed)
Radiation Oncology         [phone] ________________________________  Name: Bridget Lamb MRN: [id number]  Date: 07/03/2020  DOB: 01/24/1956  Vaginal Brachytherapy Procedure Note  CC: Lindie Spruce, MD    ICD-10-CM   1. Endometrial cancer ([place])  C54.1     Diagnosis: Recurrent FIGO stage IA (cT1a, cN0, cM0) endometrial adenocarcinoma, grade 1  Radiation Treatment Dates: 07/03/2020, 07/09/2020, 07/17/2020, and 07/24/2020  Narrative: She returns today for vaginal cylinder fitting. She underwent IMRT with 45 Gy delivered in 25 fractions of 1.8 Gy each directed at the pelvis from 08/042021 - 07/02/2020. She tolerated treatment relatively well with the exception of some mild fatigue, spotting that improved, and one episode of diarrhea. She is now to undergo brachytherapy with 24 Gy delivered in 4 fractions of 6 Gy each.  On review of systems, she reports some diarrhea yesterday. She denies vaginal bleeding, pelvic pain, and urinary symptoms.  ALLERGIES: has No Known Allergies.  Meds: Current Outpatient Medications  Medication Sig Dispense Refill  . albuterol (PROVENTIL HFA;VENTOLIN HFA) 108 (90 Base) MCG/ACT inhaler Inhale 2 puffs into the lungs every 4 (four) hours as needed for wheezing or shortness of breath. 1 Inhaler 0  . aspirin EC 81 MG tablet Take 81 mg by mouth daily.    . Calcium Carbonate-Vitamin D (CALCIUM 600+D PO) Take 1 tablet by mouth daily.    . DULERA 100-5 MCG/ACT AERO Inhale 2 puffs into the lungs in the morning and at bedtime.     . FeFum-FePoly-FA-B Cmp-C-Biot (INTEGRA PLUS) CAPS Take 1 capsule by mouth daily.    Marland Kitchen levothyroxine (SYNTHROID, LEVOTHROID) 150 MCG tablet Take 150 mcg by mouth daily before breakfast.     . lisinopril (ZESTRIL) 10 MG tablet Take 1 tablet (10 mg total) by mouth daily. (Patient not taking: Reported on 05/19/2020)    . loratadine (CLARITIN) 10 MG tablet Take 10 mg by mouth daily.    . metoprolol  succinate (TOPROL-XL) 25 MG 24 hr tablet TAKE 1 TABLET BY MOUTH ONCE DAILY WITH  OR  IMMEDIATELY  FOLLOWING  A  MEAL 30 tablet 0  . olopatadine (PATANOL) 0.1 % ophthalmic solution Place 1 drop into both eyes 2 (two) times daily.     . ondansetron (ZOFRAN) 4 MG tablet Take 1 tablet (4 mg total) by mouth every 8 (eight) hours as needed for nausea or vomiting. 20 tablet 0  . rosuvastatin (CRESTOR) 10 MG tablet Take 10 mg by mouth at bedtime.    . senna-docusate (SENOKOT-S) 8.6-50 MG tablet Take 1 tablet by mouth 2 (two) times daily as needed for moderate constipation.    . torsemide (DEMADEX) 20 MG tablet Take 1 tablet (20 mg total) by mouth daily. Take extra one tab for leg swelling 90 tablet 2   No current facility-administered medications for this encounter.    Physical Findings: The patient is in no acute distress. Patient is alert and oriented.  height is 5\' 4"  (1.626 m) and weight is 250 lb 4 oz (113.5 kg). Her oral temperature is 99.8 F (37.7 C). Her blood pressure is 150/54 (abnormal) and her pulse is 72. Her respiration is 18 and oxygen saturation is 100%.   No palpable cervical, supraclavicular or axillary lymphoadenopathy. The heart has a regular rate and rhythm. The lungs are clear to auscultation. Abdomen soft and non-tender.  On pelvic examination the external genitalia were unremarkable. A speculum exam was performed. Vaginal cuff intact, recurrent lesion  has decreased significantly in size.  Estimated to be approximately 5 x 7 mm. on bimanual exam there is minimal palpable tumor in the vaginal cuff region.  Lab Findings: Lab Results  Component Value Date   WBC 9.2 05/05/2020   HGB 8.8 (L) 05/05/2020   HCT 27.3 (L) 05/05/2020   MCV 96.8 05/05/2020   PLT 190 05/05/2020    Radiographic Findings: No results found.  Impression: Recurrent FIGO stage IA (cT1a, cN0, cM0) endometrial adenocarcinoma, grade 1  Patient was fitted for a vaginal cylinder. The patient will be treated  with a 2.5 cm diameter cylinder with a treatment length of 3.0 cm. This distended the vaginal vault without undue discomfort. The patient tolerated the procedure well.  The patient was successfully fitted for a vaginal cylinder. The patient is appropriate to begin vaginal brachytherapy.   Plan: The patient will proceed with CT simulation and vaginal brachytherapy today.    _______________________________   Blair Promise, PhD, MD  This document serves as a record of services personally performed by Gery Pray, MD. It was created on his behalf by Clerance Lav, a trained medical scribe. The creation of this record is based on the scribe's personal observations and the provider's statements to them. This document has been checked and approved by the attending provider.

## 2020-07-02 NOTE — Telephone Encounter (Signed)
CALLED PATIENT TO REMIND OF NEW [facility]DR [place] FOR 07-03-20, LVM FOR A RETURN CALL

## 2020-07-02 NOTE — Progress Notes (Signed)
  Radiation Oncology         [phone] ________________________________  Name: Bridget Lamb MRN: [id number]  Date: 07/03/2020  DOB: 1956-05-20  CC: [name], PA-C  [name], PA-C  HDR BRACHYTHERAPY NOTE  DIAGNOSIS: Recurrent FIGO stage IA (cT1a, cN0, cM0) endometrial adenocarcinoma, grade 1   Simple treatment device note: Patient had construction of her custom vaginal cylinder. She will be treated with a 2.5 cm diameter segmented cylinder. This conforms to her anatomy without undue discomfort.  Vaginal brachytherapy procedure node: The patient was brought to the [place] suite. Identity was confirmed. All relevant records and images related to the planned course of therapy were reviewed. The patient freely provided informed written consent to proceed with treatment after reviewing the details related to the planned course of therapy. The consent form was witnessed and verified by the simulation staff. Then, the patient was set-up in a stable reproducible supine position for radiation therapy. Pelvic exam revealed the vaginal cuff to be intact. . The patient's custom vaginal cylinder was placed in the proximal vagina. This was affixed to the CT/MR stabilization plate to prevent slippage. Patient tolerated the placement well.  Verification simulation note:  A fiducial marker was placed within the vaginal cylinder. An AP and lateral film was then obtained through the pelvis area. This documented accurate position of the vaginal cylinder for treatment.  HDR BRACHYTHERAPY TREATMENT  The remote afterloading device was affixed to the vaginal cylinder by catheter. Patient then proceeded to undergo her first high-dose-rate treatment directed at the proximal vagina. The patient was prescribed a dose of 6.0 gray to be delivered to the mucosal surface. Treatment length was 3.0 cm. Patient was treated with 1 channel using 7 dwell positions. Treatment time was 243.8 seconds. Iridium 192 was  the high-dose-rate source for treatment. The patient tolerated the treatment well. After completion of her therapy, a radiation survey was performed documenting return of the iridium source into the GammaMed safe.   PLAN: The patient will return next week for her second high-dose-rate treatment.  ________________________________    Blair Promise, PhD, MD  This document serves as a record of services personally performed by Gery Pray, MD. It was created on his behalf by Clerance Lav, a trained medical scribe. The creation of this record is based on the scribe's personal observations and the provider's statements to them. This document has been checked and approved by the attending provider.

## 2020-07-03 ENCOUNTER — Other Ambulatory Visit: Payer: Self-pay

## 2020-07-03 ENCOUNTER — Ambulatory Visit
Admission: RE | Admit: 2020-07-03 | Discharge: 2020-07-03 | Disposition: A | Discharge status: Home / Self Care | Payer: BC Managed Care – PPO | Source: Ambulatory Visit | Attending: Radiation Oncology | Admitting: Radiation Oncology

## 2020-07-03 ENCOUNTER — Encounter: Payer: Self-pay | Admitting: Radiation Oncology

## 2020-07-03 VITALS — BP 150/54 | HR 72 | Temp 99.8°F | Resp 18 | Ht 64.0 in | Wt 250.2 lb

## 2020-07-03 DIAGNOSIS — C541 Malignant neoplasm of endometrium: Secondary | ICD-10-CM

## 2020-07-03 NOTE — Progress Notes (Signed)
Patient here for an [facility] visit with Dr. Sondra Come. Patient reports finishing her treatments yesterday. She denies bleeding or pain. Denies bladder problems and does report a small amount of diarrhea  Yesterday.  BP (!) 150/54 (BP Location: Left Arm, Patient Position: Sitting)   Pulse 72   Temp 99.8 F (37.7 C) (Oral)   Resp 18   Ht 5\' 4"  (1.626 m)   Wt 250 lb 4 oz (113.5 kg)   LMP 10/22/2011   SpO2 100%   BMI 42.96 kg/m   Wt Readings from Last 3 Encounters:  07/03/20 250 lb 4 oz (113.5 kg)  05/19/20 (!) 247 lb 6.4 oz (112.2 kg)  05/05/20 248 lb (112.5 kg)

## 2020-07-08 ENCOUNTER — Ambulatory Visit: Payer: BC Managed Care – PPO | Admitting: Gynecologic Oncology

## 2020-07-09 ENCOUNTER — Ambulatory Visit
Admission: RE | Admit: 2020-07-09 | Discharge: 2020-07-09 | Disposition: A | Discharge status: Home / Self Care | Payer: BC Managed Care – PPO | Source: Ambulatory Visit | Attending: Radiation Oncology | Admitting: Radiation Oncology

## 2020-07-09 ENCOUNTER — Ambulatory Visit: Payer: BC Managed Care – PPO | Admitting: Gynecologic Oncology

## 2020-07-09 ENCOUNTER — Other Ambulatory Visit: Payer: Self-pay

## 2020-07-09 DIAGNOSIS — C541 Malignant neoplasm of endometrium: Secondary | ICD-10-CM

## 2020-07-09 NOTE — Progress Notes (Signed)
  Radiation Oncology         [phone] ________________________________  Name: Bridget Lamb MRN: [id number]  Date: 07/09/2020  DOB: 08/28/1956  CC: [name], PA-C  HDR BRACHYTHERAPY NOTE  DIAGNOSIS: Recurrent FIGO stage IA (cT1a, cN0, cM0) endometrial adenocarcinoma, grade 1   Simple treatment device note: Patient had construction of her custom vaginal cylinder. She will be treated with a 2.5 cm diameter segmented cylinder. This conforms to her anatomy without undue discomfort.  Vaginal brachytherapy procedure node: The patient was brought to the [place] suite. Identity was confirmed. All relevant records and images related to the planned course of therapy were reviewed. The patient freely provided informed written consent to proceed with treatment after reviewing the details related to the planned course of therapy. The consent form was witnessed and verified by the simulation staff. Then, the patient was set-up in a stable reproducible supine position for radiation therapy. Pelvic exam revealed the vaginal cuff to be intact. . The patient's custom vaginal cylinder was placed in the proximal vagina. This was affixed to the CT/MR stabilization plate to prevent slippage. Patient tolerated the placement well.  Verification simulation note:  A fiducial marker was placed within the vaginal cylinder. An AP and lateral film was then obtained through the pelvis area. This documented accurate position of the vaginal cylinder for treatment.  HDR BRACHYTHERAPY TREATMENT  The remote afterloading device was affixed to the vaginal cylinder by catheter. Patient then proceeded to undergo her second high-dose-rate treatment directed at the proximal vagina. The patient was prescribed a dose of 6.0 gray to be delivered to the mucosal surface. Treatment length was 3.0 cm. Patient was treated with 1 channel using 7 dwell positions. Treatment time was 257.6 seconds. Iridium 192  was the high-dose-rate source for treatment. The patient tolerated the treatment well. After completion of her therapy, a radiation survey was performed documenting return of the iridium source into the GammaMed safe.   PLAN: The patient will return next week for her third high-dose-rate treatment.  ________________________________    Blair Promise, PhD, MD  This document serves as a record of services personally performed by Gery Pray, MD. It was created on his behalf by Clerance Lav, a trained medical scribe. The creation of this record is based on the scribe's personal observations and the provider's statements to them. This document has been checked and approved by the attending provider.

## 2020-07-16 ENCOUNTER — Telehealth: Payer: Self-pay | Admitting: *Deleted

## 2020-07-16 NOTE — Telephone Encounter (Signed)
Called patient to remind of Bridget Lamb. For 07-17-20 @ 3 pm, spoke with patient and she Is aware of this appt.

## 2020-07-16 NOTE — Progress Notes (Signed)
  Radiation Oncology         [phone] ________________________________  Name: Bridget Lamb MRN: [id number]  Date: 07/17/2020  DOB: 05-13-1956  CC: [name], PA-C  [name], PA-C  HDR BRACHYTHERAPY NOTE  DIAGNOSIS: Recurrent FIGO stage IA (cT1a, cN0, cM0) endometrial adenocarcinoma, grade 1   Simple treatment device note: Patient had construction of her custom vaginal cylinder. She will be treated with a 2.5 cm diameter segmented cylinder. This conforms to her anatomy without undue discomfort.  Vaginal brachytherapy procedure node: The patient was brought to the [place] suite. Identity was confirmed. All relevant records and images related to the planned course of therapy were reviewed. The patient freely provided informed written consent to proceed with treatment after reviewing the details related to the planned course of therapy. The consent form was witnessed and verified by the simulation staff. Then, the patient was set-up in a stable reproducible supine position for radiation therapy. Pelvic exam revealed the vaginal cuff to be intact. . The patient's custom vaginal cylinder was placed in the proximal vagina. This was affixed to the CT/MR stabilization plate to prevent slippage. Patient tolerated the placement well.  Verification simulation note:  A fiducial marker was placed within the vaginal cylinder. An AP and lateral film was then obtained through the pelvis area. This documented accurate position of the vaginal cylinder for treatment.  HDR BRACHYTHERAPY TREATMENT  The remote afterloading device was affixed to the vaginal cylinder by catheter. Patient then proceeded to undergo her third high-dose-rate treatment directed at the proximal vagina. The patient was prescribed a dose of 6.0 gray to be delivered to the mucosal surface. Treatment length was 3.0 cm. Patient was treated with 1 channel using 7 dwell positions. Treatment time was 278.0 seconds. Iridium 192 was  the high-dose-rate source for treatment. The patient tolerated the treatment well. After completion of her therapy, a radiation survey was performed documenting return of the iridium source into the GammaMed safe.   PLAN: The patient will return in one week for her fourth and final high-dose-rate treatment.  ________________________________    Blair Promise, PhD, MD  This document serves as a record of services personally performed by Gery Pray, MD. It was created on his behalf by Clerance Lav, a trained medical scribe. The creation of this record is based on the scribe's personal observations and the provider's statements to them. This document has been checked and approved by the attending provider.

## 2020-07-17 ENCOUNTER — Other Ambulatory Visit: Payer: Self-pay

## 2020-07-17 ENCOUNTER — Ambulatory Visit
Admission: RE | Admit: 2020-07-17 | Discharge: 2020-07-17 | Disposition: A | Discharge status: Home / Self Care | Payer: BC Managed Care – PPO | Source: Ambulatory Visit | Attending: Radiation Oncology | Admitting: Radiation Oncology

## 2020-07-17 DIAGNOSIS — C541 Malignant neoplasm of endometrium: Secondary | ICD-10-CM | POA: Diagnosis not present

## 2020-07-23 ENCOUNTER — Encounter: Payer: Self-pay | Admitting: Cardiology

## 2020-07-23 ENCOUNTER — Other Ambulatory Visit: Payer: Self-pay

## 2020-07-23 ENCOUNTER — Telehealth: Payer: Self-pay | Admitting: *Deleted

## 2020-07-23 ENCOUNTER — Ambulatory Visit: Payer: BC Managed Care – PPO | Admitting: Cardiology

## 2020-07-23 VITALS — BP 158/78 | HR 75 | Resp 16 | Ht 64.0 in | Wt 250.0 lb

## 2020-07-23 DIAGNOSIS — Z6841 Body Mass Index (BMI) 40.0 and over, adult: Secondary | ICD-10-CM

## 2020-07-23 DIAGNOSIS — N1832 Chronic kidney disease, stage 3b: Secondary | ICD-10-CM

## 2020-07-23 DIAGNOSIS — I5032 Chronic diastolic (congestive) heart failure: Secondary | ICD-10-CM

## 2020-07-23 DIAGNOSIS — I10 Essential (primary) hypertension: Secondary | ICD-10-CM

## 2020-07-23 MED ORDER — BYSTOLIC 20 MG PO TABS: 1.0000 | ORAL_TABLET | Freq: Every day | ORAL | 2 refills | Status: DC

## 2020-07-23 NOTE — Progress Notes (Signed)
  Radiation Oncology         ([phone] ________________________________  Name: Bridget Lamb MRN: [id number]  Date: 07/24/2020  DOB: 12-29-1955  CC: [name], PA-C  [name], PA-C  HDR BRACHYTHERAPY NOTE  DIAGNOSIS: Recurrent FIGO stage IA (cT1a, cN0, cM0) endometrial adenocarcinoma, grade 1   Simple treatment device note: Patient had construction of her custom vaginal cylinder. She will be treated with a 2.5 cm diameter segmented cylinder. This conforms to her anatomy without undue discomfort.  Vaginal brachytherapy procedure node: The patient was brought to the [place] suite. Identity was confirmed. All relevant records and images related to the planned course of therapy were reviewed. The patient freely provided informed written consent to proceed with treatment after reviewing the details related to the planned course of therapy. The consent form was witnessed and verified by the simulation staff. Then, the patient was set-up in a stable reproducible supine position for radiation therapy. Pelvic exam revealed the vaginal cuff to be intact. . The patient's custom vaginal cylinder was placed in the proximal vagina. This was affixed to the CT/MR stabilization plate to prevent slippage. Patient tolerated the placement well.  Verification simulation note:  A fiducial marker was placed within the vaginal cylinder. An AP and lateral film was then obtained through the pelvis area. This documented accurate position of the vaginal cylinder for treatment.  HDR BRACHYTHERAPY TREATMENT  The remote afterloading device was affixed to the vaginal cylinder by catheter. Patient then proceeded to undergo her fourth high-dose-rate treatment directed at the proximal vagina. The patient was prescribed a dose of 6.0 gray to be delivered to the mucosal surface. Treatment length was 3.0 cm. Patient was treated with 1 channel using 7 dwell positions. Treatment time was 296.9 seconds. Iridium 192  was the high-dose-rate source for treatment. The patient tolerated the treatment well. After completion of her therapy, a radiation survey was performed documenting return of the iridium source into the GammaMed safe.   PLAN: The patient will return in one month for routine follow-up. ________________________________    Blair Promise, PhD, MD  This document serves as a record of services personally performed by Gery Pray, MD. It was created on his behalf by Clerance Lav, a trained medical scribe. The creation of this record is based on the scribe's personal observations and the provider's statements to them. This document has been checked and approved by the attending provider.

## 2020-07-23 NOTE — Telephone Encounter (Signed)
CALLED PATIENT TO REMIND OF HDR [place] 07-24-20 @ 1 PM, LVM FOR A RETURN CALL

## 2020-07-23 NOTE — Progress Notes (Signed)
Primary Physician/Referring:  [name], PA-C  Patient ID: Bridget Lamb, female    DOB: May 04, 1956, 64 y.o.   MRN: [id number]  Chief Complaint  Patient presents with  . Congestive Heart Failure  . Hypertension  . Leg Swelling  . Follow-up    3 month   HPI:    Bridget Lamb  is a 64 y.o. female with morbid obesity BMI 44, hypertension, restrictive airway disease due to bronchial asthma, hyperlipidemia, HFpEF, OSA and not using CPAP due to feeling claustrophobic is here for 6 month and also post hospital admission follow up. Patient was admitted to ICU at the Hospital on 02/26/2020 for acute respiratory failure due to acute pulmonary edema in the setting of uncontrolled hypertension and diastolic CHF exacerbation.  This is a 38-monthoffice visit, states that her dyspnea has remained stable.  She has noticed no recurrence of leg edema except at the end of the day she may have trace ankle edema.  No PND or orthopnea and overall feeling well.  No significant change in her weight.     Past Medical History:  Diagnosis Date  . Allergic rhinitis   . Anemia    hematology, Dr. MJulien Nordmann last 01/2011  . Asthma   . [place]ervix [place]   . [place]  . Chronic kidney disease    protein in urine  . Complication of anesthesia   . Dyslipidemia   . Dyspnea    on excertion  . [place]omet[place]   . H/O echocardiogram 01/07/04   mild LVH, nl LV systolic function, EF 632% left atrial enlargement, mildly thickened aortic and mitral valves  . History of thyroid cancer   . Hypertension   . Incarcerated ventral hernia 10/07/2013  . Obesity, Class III, BMI 40-49.9 (morbid obesity) ([place] 10/09/2013  . Osteopenia   . PONV (postoperative nausea and vomiting)   . Sleep apnea   . Umbilical hernia   . Unspecified essential hypertension 10/09/2013  . Unspecified hypothyroidism 10/09/2013   Past Surgical History:  Procedure Laterality Date  . BONE  MARROW BIOPSY    . CARDIAC CATHETERIZATION  2005  . CHOLECYSTECTOMY  2003  . COLONOSCOPY  2008  . ROBOTIC ASSISTED TOTAL HYSTERECTOMY WITH BILATERAL SALPINGO OOPHERECTOMY N/A 10/25/2019   Procedure: XI ROBOTIC ASSISTED TOTAL HYSTERECTOMY WITH BILATERAL SALPINGO OOPHORECTOMY;  Surgeon: TLafonda Mosses MD;  Location: [facility];  Service: Gynecology;  Laterality: N/A;  . SENTINEL NODE BIOPSY N/A 10/25/2019   Procedure: SENTINEL LYMPH NODE BIOPSY;  Surgeon: TLafonda Mosses MD;  Location: [facility];  Service: Gynecology;  Laterality: N/A;  . THYROID SURGERY     partial thyroidectomy  . TOTAL THYROIDECTOMY  2009  . VENTRAL HERNIA REPAIR N/A 10/07/2013   Procedure: HERNIA REPAIR VENTRAL ADULT;  Surgeon: JGwenyth Ober MD;  [place]  Service: General;  Laterality: N/A;   Family History  Problem Relation Age of Onset  . Hypertension Mother   . Pancreatic cancer Mother   . Breast cancer Maternal Aunt   . Lung cancer Father   . Ovarian cancer Neg Hx   . Endometrial cancer Neg Hx   . Colon cancer Neg Hx     Social History   Tobacco Use  . Smoking status: Never Smoker  . Smokeless tobacco: Never Used  Substance Use Topics  . Alcohol use: No   Marital Status: Divorced  ROS  Review of Systems  Cardiovascular: Positive for dyspnea on  exertion and leg swelling (minimal). Negative for chest pain, claudication and palpitations.  Respiratory: Positive for snoring (has sleep apnea unable to wear CPAP).   Musculoskeletal: Positive for joint pain (knee and hips chronic).   Objective  Blood pressure (!) 158/78, pulse 75, resp. rate 16, height _0  (1.626 m), weight 250 lb (113.4 kg), last menstrual period 10/22/2011, SpO2 96 %.  Vitals with BMI 07/23/2020 07/03/2020 05/19/2020  Height _1  _2  _3   Weight 250 lbs 250 lbs 4 oz 247 lbs 6 oz  BMI 42.89 53.79 43.27  Systolic 614 709 295  Diastolic 78 54 62  Pulse 75 72 72     Physical Exam Constitutional:      General: She is not  in acute distress.    Appearance: She is well-developed.     Comments: Morbidly obese  Cardiovascular:     Rate and Rhythm: Normal rate and regular rhythm.     Pulses: Intact distal pulses.     Heart sounds: Murmur heard.  Early systolic murmur is present with a grade of 1/6 at the apex.  No gallop.      Comments: 1-2+ bilateral lower extremity, superficial varicose veins noted. Chronic venostasis pigmentation noted. No JVD  Pulmonary:     Effort: Pulmonary effort is normal. No accessory muscle usage or respiratory distress.     Breath sounds: Normal breath sounds.  Abdominal:     General: Bowel sounds are normal.     Palpations: Abdomen is soft.     Comments: Pannus present    Laboratory examination:   Recent Labs    05/01/20 1420 05/05/20 0809 05/20/20 0931  NA 142 139 140  K 5.2* 5.2* 5.2  CL 103 102 99  CO2 _4 GLUCOSE 94 95 101*  BUN 38* 51* 31*  CREATININE 1.72* 1.93* 1.49*  CALCIUM 10.3 9.7 10.0  GFRNONAA 31* 27* 37*  GFRAA 36* 31* 42*   CrCl cannot be calculated (Patient's most recent lab result is older than the maximum 21 days allowed.).  CMP Latest Ref Rng & Units 05/20/2020 05/05/2020 05/01/2020  Glucose 65 - 99 mg/dL 101(H) 95 94  BUN 8 - 27 mg/dL 31(H) 51(H) 38(H)  Creatinine 0.57 - 1.00 mg/dL 1.49(H) 1.93(H) 1.72(H)  Sodium 134 - 144 mmol/L 140 139 142  Potassium 3.5 - 5.2 mmol/L 5.2 5.2(H) 5.2(H)  Chloride 96 - 106 mmol/L 99 102 103  CO2 20 - 29 mmol/L _5 Calcium 8.7 - 10.3 mg/dL 10.0 9.7 10.3  Total Protein 6.5 - 8.1 g/dL - - -  Total Bilirubin 0.3 - 1.2 mg/dL - - -  Alkaline Phos 38 - 126 U/L - - -  AST 15 - 41 U/L - - -  ALT 0 - 44 U/L - - -   CBC Latest Ref Rng & Units 05/05/2020 02/25/2020 02/23/2020  WBC 4.0 - 10.5 K/uL 9.2 7.2 7.4  Hemoglobin 12.0 - 15.0 g/dL 8.8(L) 8.9(L) 8.8(L)  Hematocrit 36 - 46 % 27.3(L) 30.4(L) 29.9(L)  Platelets 150 - 400 K/uL 190 175 173   TSH Recent Labs    02/20/20 2149  TSH 1.263   BNP (last 3  results) Recent Labs    02/20/20 1253 05/20/20 0931  BNP 562.6* 82.7   Magnesium:  Recent Labs    02/24/20 0409 02/25/20 0338 02/26/20 0246  MG 1.8 1.8 1.9    External labs:   Lab 07/17/2020: BUN 15, creatinine 1.28, EGFR 44 mL.  Potassium 3.8, sodium 142.  TSH 7.220, mildly elevated.  Labs 04/14/2020: Serum glucose 95 mg, BUN 35, creatinine 1.47, EGFR 37 mL.  02/22/20:   Vitamin B12 347.  Iron 26 (L), TIBC 288, Saturation Ratios 9 (L), UIBC 262.    Labs 12/05/2019: Total cholesterol 158, triglycerides 102, HDL 53, LDL 86.    Medications and allergies   Allergies  Allergen Reactions  . Procardia [Nifedipine] Other (See Comments)    Leg edema    Current Outpatient Medications on File Prior to Visit  Medication Sig Dispense Refill  . albuterol (PROVENTIL HFA;VENTOLIN HFA) 108 (90 Base) MCG/ACT inhaler Inhale 2 puffs into the lungs every 4 (four) hours as needed for wheezing or shortness of breath. 1 Inhaler 0  . aspirin EC 81 MG tablet Take 81 mg by mouth daily.    . Calcium Carbonate-Vitamin D (CALCIUM 600+D PO) Take 1 tablet by mouth daily.    Marland Kitchen FeFum-FePoly-FA-B Cmp-C-Biot (INTEGRA PLUS) CAPS Take 1 capsule by mouth daily.    Marland Kitchen levothyroxine (SYNTHROID, LEVOTHROID) 150 MCG tablet Take 150 mcg by mouth daily before breakfast.     . loratadine (CLARITIN) 10 MG tablet Take 10 mg by mouth daily.    . mometasone-formoterol (DULERA) 200-5 MCG/ACT AERO Inhale 2 puffs into the lungs 2 (two) times daily.    Marland Kitchen olopatadine (PATANOL) 0.1 % ophthalmic solution Place 1 drop into both eyes 2 (two) times daily.     . ondansetron (ZOFRAN) 4 MG tablet Take 1 tablet (4 mg total) by mouth every 8 (eight) hours as needed for nausea or vomiting. 20 tablet 0  . rosuvastatin (CRESTOR) 10 MG tablet Take 10 mg by mouth at bedtime.    . senna-docusate (SENOKOT-S) 8.6-50 MG tablet Take 1 tablet by mouth 2 (two) times daily as needed for moderate constipation.    . torsemide (DEMADEX) 20 MG  tablet Take 1 tablet (20 mg total) by mouth daily. Take extra one tab for leg swelling 90 tablet 2  . DULERA 100-5 MCG/ACT AERO Inhale 2 puffs into the lungs in the morning and at bedtime.      No current facility-administered medications on file prior to visit.    Radiology:    CT angiography chest with contrast 02/20/2020: 1. No evidence for acute pulmonary embolus. 2. Patchy ground-glass and consolidative opacities throughout the aerated lungs bilaterally which may represent edema or infection. Additionally, there are scattered areas of consolidation within the lungs bilaterally which may represent atelectasis or infection. Consider follow-up chest CT in 6-8 weeks after resolution of acute symptomatology to ensure the nodularity resolves. 3. Moderate bilateral pleural effusions. 4. Prominent mediastinal lymph nodes, likely reactive. 5. Dilated main pulmonary artery as can be seen with pulmonary arterial hypertension. 6. Aortic atherosclerosis.  Cardiac Studies:   Lexiscan Sestamibi Stress Test 01/29/2014: There is a very small and very mild fixed defect in the mid anterior wall consistent with breast attenuation.. No reversible  perfusion defects are identified.  Echocardiogram 08/29/2018:  Left ventricle: The cavity size was normal. Systolic function was   normal. The estimated ejection fraction was in the range of 60%   to 65%. Images were inadequate for LV wall motion assessment. Abnormal septal shudder as well as respiratory related leftward   septal shift. Doppler parameters are consistent with high   ventricular filling pressure (E/e&' = 23). - Aortic valve: There was no stenosis. There was no regurgitation.   Mean gradient (S): 6 mm Hg. Valve area (VTI): 1.89 cm^2. Valve  area (Vmax): 1.77 cm^2. Valve area (Vmean): 1.63 cm^2. - Mitral valve: Calcified annulus.  There was mild regurgitation. - Left atrium: The atrium was moderately to severely dilated. - Right ventricle: The cavity  size was normal. Wall thickness was   normal. Systolic function was normal. - Right atrium: The atrium was mildly dilated. - Tricuspid valve: There was trivial regurgitation. - Inferior vena cava: The vessel was dilated. The respirophasic  diameter changes were blunted (< 50%), consistent with elevated   central venous pressure. - Pericardium, extracardiac: A trivial pericardial effusion was   identified anterior to the heart. - Increased flow velocities, may be secondary to anemia,   thyrotoxicosis, or high flow state.  Echocardiogram 02/21/2020: 1. Left ventricular ejection fraction, by estimation, is 60 to 65%. The left ventricle has normal function. The left ventricle has no regional wall motion abnormalities. There is mild concentric left ventricular hypertrophy.  Left ventricular diastolic parameters are consistent with Grade II diastolic dysfunction (pseudonormalization).  Elevated left ventricular end-diastolic pressure.  2. Right ventricular systolic function is normal. The right ventricular size is normal. There is mildly elevated pulmonary artery systolic pressure.  3. Left atrial size was moderately dilated.  4. The mitral valve is normal in structure. Trivial mitral valve regurgitation. No evidence of mitral stenosis.  5. The aortic valve was not well visualized. Aortic valve regurgitation is not visualized. No aortic stenosis is present.  6. The inferior vena cava is dilated in size with <50% respiratory variability, suggesting right atrial pressure of 15 mmHg.   Lexiscan Sestamibi stress test 03/10/2020: No previous exam available for comparison. Lexiscan nuclear stress test performed using 1-day protocol. Stress EKG is non-diagnostic, as this is pharmacological stress test. Normal myocardial perfusion. Stress LVEF 59%. Low risk study.    EKG    EKG 07/23/2020: Normal sinus rhythm at the rate of 73 bpm, normal axis.  Incomplete right bundle branch block.  Poor R wave  progression, cannot exclude anteroseptal infarct old.  Low-voltage complexes.  Pulmonary disease pattern.   No significant change from 02/20/20  Assessment     ICD-10-CM   1. Chronic diastolic CHF (congestive heart failure) (HCC)  I50.32 EKG 12-Lead    Nebivolol HCl (BYSTOLIC) 20 MG TABS  2. Essential hypertension, benign  I10 Nebivolol HCl (BYSTOLIC) 20 MG TABS  3. Stage 3b chronic kidney disease  N18.32   4. Class 3 severe obesity due to excess calories without serious comorbidity with body mass index (BMI) of 40.0 to 44.9 in adult [facility])  E66.01    Z68.41      Meds ordered this encounter  Medications  . Nebivolol HCl (BYSTOLIC) 20 MG TABS    Sig: Take 1 tablet (20 mg total) by mouth daily.    Dispense:  30 tablet    Refill:  2    Medications Discontinued During This Encounter  Medication Reason  . lisinopril (ZESTRIL) 10 MG tablet Side effect (s)  . metoprolol succinate (TOPROL-XL) 25 MG 24 hr tablet Change in therapy    Recommendations:   Atira Borello Brannum  is a 64 y.o. female with morbid obesity BMI 44, hypertension, restrictive airway disease due to bronchial asthma, hyperlipidemia, HFpEF, OSA and not using CPAP due to feeling claustrophobic is here for 6 month and also post hospital admission follow up. Patient was admitted to ICU at the Hospital on 02/26/2020 for acute respiratory failure due to acute pulmonary edema in the setting of uncontrolled hypertension and diastolic CHF exacerbation.   Patient  is here on a 79-monthoffice visit for follow-up of chronic diastolic heart failure and hypertension.  Since discontinuing Procardia leg edema is essentially completely resolved.  I reviewed her labs, she continues to have stage IIIb chronic kidney disease, hence we need to be extremely aggressive with her hypertension control both for chronic diastolic heart failure and also for renal protection.  She has underlying bronchial asthma and hence I will discontinue metoprolol  succinate and switch her to Bystolic which is highly selective beta-blocker at 20 mg once daily.  She could certainly try 10 mg for a week to have goal blood pressure 130/80 mmHg or less.  I would like to see her back in 6 weeks for follow-up.  I reviewed her labs, renal function is essentially remained stable.  Given 10 mg of lisinopril renal function has not deteriorated much hence we could potentially consider restarting lisinopril at 10 mg daily for renal protection.  However I will leave this to her PCP.

## 2020-07-23 NOTE — Progress Notes (Incomplete)
  Patient Name: Bridget Lamb MRN: [id number] DOB: [birth year] Referring Physician: Jeral Pinch Date of Service: 07/02/2020 [facility], [place]                                                        End Of Treatment Note  Diagnoses: C54.1-Malignant neoplasm of endometrium  Cancer Staging: Recurrent FIGO stage IA (cT1a, cN0, cM0) endometrial adenocarcinoma, grade 1  Intent: Curative  Radiation Treatment Dates: 05/28/2020 through 07/02/2020 Site Technique Total Dose (Gy) Dose per Fx (Gy) Completed Fx Beam Energies  Pelvis: Pelvis IMRT 45/45 1.8 25/25 6X   Narrative: The patient tolerated radiation therapy relatively well. She did report some mild fatigue, some nausea at night, one episode of diarrhea, and light pink vaginal bleeding at the beginning of treatment that resolved. She denied vaginal/pelvic pain, hematuria, dysuria, rectal bleeding, constipation, skin changes, urinary frequency, urinary urgency, poor appetite, and vomiting.  Plan: The patient will begin vaginal brachytherapy tomorrow, 07/03/2020. Anticipate 24 Gyn in 4 fractions of 6 Gy each.  ________________________________________________   Blair Promise, PhD, MD  This document serves as a record of services personally performed by Gery Pray, MD. It was created on his behalf by Clerance Lav, a trained medical scribe. The creation of this record is based on the scribe's personal observations and the provider's statements to them. This document has been checked and approved by the attending provider.

## 2020-07-24 ENCOUNTER — Ambulatory Visit
Admission: RE | Admit: 2020-07-24 | Discharge: 2020-07-24 | Disposition: A | Discharge status: Home / Self Care | Payer: BC Managed Care – PPO | Source: Ambulatory Visit | Attending: Radiation Oncology | Admitting: Radiation Oncology

## 2020-07-24 ENCOUNTER — Other Ambulatory Visit: Payer: Self-pay

## 2020-07-24 ENCOUNTER — Encounter: Payer: Self-pay | Admitting: Radiation Oncology

## 2020-07-24 DIAGNOSIS — C541 Malignant neoplasm of endometrium: Secondary | ICD-10-CM | POA: Diagnosis not present

## 2020-07-30 ENCOUNTER — Other Ambulatory Visit: Payer: Self-pay | Admitting: Cardiology

## 2020-07-30 DIAGNOSIS — I10 Essential (primary) hypertension: Secondary | ICD-10-CM

## 2020-08-20 NOTE — Progress Notes (Signed)
Radiation Oncology         [phone] ________________________________  Name: KAIULANI SITTON MRN: [id number]  Date: 08/21/2020  DOB: 08-14-1956  Follow-Up Visit Note  CC: [name], PA-C  [name], PA-C    ICD-10-CM   1. Endometrial cancer (HCC)  C54.1     Diagnosis: Recurrent FIGO stage IA (cT1a, cN0, cM0) endometrial adenocarcinoma, grade 1  Interval Since Last Radiation: Four weeks  Radiation Treatment Dates: 05/28/2020 through 07/24/2020 Site Technique Total Dose (Gy) Dose per Fx (Gy) Completed Fx Beam Energies  Pelvis: Pelvis IMRT 45/45 1.8 25/25 6X  Vagina: Pelvis_Bst HDR-brachy 24/24 6 4/4 Ir-192    Narrative:  The patient returns today for routine follow-up. No significant interval history since the end of treatment.  On review of systems, she reports mild fatigue. She denies vaginal bleeding, pelvic pain abdominal bloating or urinary symptoms she continues to work full-time at [employer].                           ALLERGIES:  is allergic to procardia [nifedipine].  Meds: Current Outpatient Medications  Medication Sig Dispense Refill  . albuterol (PROVENTIL HFA;VENTOLIN HFA) 108 (90 Base) MCG/ACT inhaler Inhale 2 puffs into the lungs every 4 (four) hours as needed for wheezing or shortness of breath. 1 Inhaler 0  . aspirin EC 81 MG tablet Take 81 mg by mouth daily.    . Calcium Carbonate-Vitamin D (CALCIUM 600+D PO) Take 1 tablet by mouth daily.    . DULERA 100-5 MCG/ACT AERO Inhale 2 puffs into the lungs in the morning and at bedtime.     . FeFum-FePoly-FA-B Cmp-C-Biot (INTEGRA PLUS) CAPS Take 1 capsule by mouth daily.    Marland Kitchen levothyroxine (SYNTHROID, LEVOTHROID) 150 MCG tablet Take 150 mcg by mouth daily before breakfast.     . loratadine (CLARITIN) 10 MG tablet Take 10 mg by mouth daily.    . mometasone-formoterol (DULERA) 200-5 MCG/ACT AERO Inhale 2 puffs into the lungs 2 (two) times daily.    . Nebivolol HCl (BYSTOLIC) 20 MG TABS Take 1 tablet (20  mg total) by mouth daily. 30 tablet 2  . olopatadine (PATANOL) 0.1 % ophthalmic solution Place 1 drop into both eyes 2 (two) times daily.     . ondansetron (ZOFRAN) 4 MG tablet Take 1 tablet (4 mg total) by mouth every 8 (eight) hours as needed for nausea or vomiting. 20 tablet 0  . rosuvastatin (CRESTOR) 10 MG tablet Take 10 mg by mouth at bedtime.    . senna-docusate (SENOKOT-S) 8.6-50 MG tablet Take 1 tablet by mouth 2 (two) times daily as needed for moderate constipation.    . torsemide (DEMADEX) 20 MG tablet Take 1 tablet (20 mg total) by mouth daily. Take extra one tab for leg swelling 90 tablet 2   No current facility-administered medications for this encounter.    Physical Findings: The patient is in no acute distress. Patient is alert and oriented.  height is 5\' 4"  (1.626 m) and weight is 249 lb (112.9 kg). Her temporal temperature is 97.5 F (36.4 C) (abnormal). Her blood pressure is 164/67 (abnormal) and her pulse is 67. Her respiration is 18 and oxygen saturation is 100%.   Lungs are clear to auscultation bilaterally. Heart has regular rate and rhythm. No palpable cervical, supraclavicular, or axillary adenopathy. Abdomen soft, non-tender, normal bowel sounds. Pelvic exam deferred in light of recent radiation therapy.  Lab Findings: Lab Results  Component Value Date   WBC 9.2 05/05/2020   HGB 8.8 (L) 05/05/2020   HCT 27.3 (L) 05/05/2020   MCV 96.8 05/05/2020   PLT 190 05/05/2020    Radiographic Findings: No results found.  Impression: Recurrent FIGO stage IA (cT1a, cN0, cM0) endometrial adenocarcinoma, grade 1  The patient is recovering from the effects of radiation.  She overall tolerated the treatment well  Plan: The patient will follow up with radiation oncology in 5 months.  She will follow-up with Dr. Berline Lopes in 2 months for detailed pelvic exam.  Today the patient was given a vaginal dilator and instructions on its use in light of her pelvic radiation therapy and  vaginal brachytherapy treatments    ____________________________________   Blair Promise, PhD, MD  This document serves as a record of services personally performed by Gery Pray, MD. It was created on his behalf by Clerance Lav, a trained medical scribe. The creation of this record is based on the scribe's personal observations and the provider's statements to them. This document has been checked and approved by the attending provider.

## 2020-08-20 NOTE — Progress Notes (Incomplete)
  Patient Name: Bridget Lamb MRN: [id number] DOB: 1955-11-11 Referring Physician: Roe Coombs (Profile Not Attached) Date of Service: 07/24/2020 [facility], [place]                                                        End Of Treatment Note  Diagnoses: C54.1-Malignant neoplasm of endometrium  Cancer Staging: Recurrent FIGO stage IA (cT1a, cN0, cM0) endometrial adenocarcinoma, grade 1  Intent: Curative  Radiation Treatment Dates: 05/28/2020 through 07/24/2020 Site Technique Total Dose (Gy) Dose per Fx (Gy) Completed Fx Beam Energies  Pelvis: Pelvis IMRT 45/45 1.8 25/25 6X  Vagina: Pelvis_Bst HDR-brachy 24/24 6 4/4 Ir-192   Narrative: The patient tolerated radiation therapy relatively well. During IMRT from 05/28/2020 - 07/02/2020, she did report some mild fatigue, some nausea at night, one episode of diarrhea, and light pink vaginal bleeding at the beginning of treatment that resolved. She denied vaginal/pelvic pain, hematuria, dysuria, rectal bleeding, constipation, skin changes, urinary frequency, urinary urgency, poor appetite, and vomiting. During HDR brachytherapy from 07/03/2020 - 07/24/2020, she did not report any complaints.  Plan: The patient will follow-up with radiation oncology in one month.  ________________________________________________   Blair Promise, PhD, MD  This document serves as a record of services personally performed by Gery Pray, MD. It was created on his behalf by Clerance Lav, a trained medical scribe. The creation of this record is based on the scribe's personal observations and the provider's statements to them. This document has been checked and approved by the attending provider.

## 2020-08-21 ENCOUNTER — Ambulatory Visit
Admission: RE | Admit: 2020-08-21 | Discharge: 2020-08-21 | Disposition: A | Discharge status: Home / Self Care | Payer: BC Managed Care – PPO | Source: Ambulatory Visit | Attending: Radiation Oncology | Admitting: Radiation Oncology

## 2020-08-21 ENCOUNTER — Encounter: Payer: Self-pay | Admitting: Radiation Oncology

## 2020-08-21 ENCOUNTER — Other Ambulatory Visit: Payer: Self-pay

## 2020-08-21 DIAGNOSIS — Z923 Personal history of irradiation: Secondary | ICD-10-CM | POA: Insufficient documentation

## 2020-08-21 DIAGNOSIS — Z79899 Other long term (current) drug therapy: Secondary | ICD-10-CM | POA: Diagnosis not present

## 2020-08-21 DIAGNOSIS — C541 Malignant neoplasm of endometrium: Secondary | ICD-10-CM | POA: Diagnosis not present

## 2020-08-21 DIAGNOSIS — R5383 Other fatigue: Secondary | ICD-10-CM | POA: Diagnosis not present

## 2020-08-21 DIAGNOSIS — Z7982 Long term (current) use of aspirin: Secondary | ICD-10-CM | POA: Insufficient documentation

## 2020-08-21 NOTE — Addendum Note (Signed)
Encounter addended by: De Burrs, RN on: 08/21/2020 2:11 PM  Actions taken: Clinical Note Signed

## 2020-08-21 NOTE — Progress Notes (Addendum)
Patient here for a f/u visit with Dr. Sondra Come. Patient denies bleeding and pain. Denies fatigue, bladder or bowel problems.  BP (!) 164/67 (BP Location: Left Arm, Patient Position: Sitting)   Pulse 67   Temp (!) 97.5 F (36.4 C) (Temporal)   Resp 18   Ht 5\' 4"  (1.626 m)   Wt 249 lb (112.9 kg)   LMP 10/22/2011   SpO2 100%   BMI 42.74 kg/m   Wt Readings from Last 3 Encounters:  08/21/20 249 lb (112.9 kg)  07/23/20 250 lb (113.4 kg)  07/03/20 250 lb 4 oz (113.5 kg)      Home Care Instructions for the Insertion and Care of Your Vaginal Dilator  Why Do I Need a Vaginal Dilator?  Internal radiation therapy may cause scar tissue to form at the top of your vagina (vaginal cuff).  This may make vaginal examinations difficult in the future. You can prevent scar tissue from forming by using a vaginal dilator (a smooth plastic rod), and/or by having regular sexual intercourse.  If not using the dilator you should be having intercourse two or three times a week.  If you are unable to have intercourse, you should use your vaginal dilator.  You may have some spotting or bleeding from your dilator or intercourse the first few times. You may also have some discomfort. If discomfort occurs with intercourse, you and your partner may need to stop for a while and try again later.  How to Use Your Vaginal Dilator  - Wash the dilator with soap and water before and after each use. - Check the dilator to be sure it is smooth. Do not use the dilator if you find any roughspots. - Coat the dilator with K-Y Jelly, Astroglide, or Replens. Do not use Vaseline, baby oil, or other oil based lubricants. They are not water-soluble and can be irritating to the tissues in the vagina. - Lie on your back with your knees bent and legs apart. - Insert the rounded end of the dilator into your vagina as far as it will go without causing pain or discomfort. - Close your knees and slowly straighten your  legs. - Keep the dilator in your vagina for about 10 to 15 minutes.  Please use 3 times a week, for example: Monday, Wednesday and Friday evenings. [facility] your knees, open your legs, and gently remove the dilator. - Gently cleanse the skin around the vaginal opening. - Wash the dilator after each use. -  It is important that you use the dilator routinely until instructed otherwise by your doctor.  Patient given an xs+ and a small dilator with lubricant and a handout with the above information. Handout reviewed with patient and she verbalized understanding.

## 2020-09-02 NOTE — Progress Notes (Signed)
 Primary Physician/Referring:  Spencer, Sara C, PA-C  Patient ID: Bridget Lamb, female    DOB: 12/24/1955, 64 y.o.   MRN: [id number]  Chief Complaint  Patient presents with  . Chronic diastolic CHF (congestive heart failure) (HCC)  . Follow-up    6 week  . Hypertension   HPI:    Bridget Lamb  is a 64 y.o. female with morbid obesity BMI 44, hypertension, restrictive airway disease due to bronchial asthma, hyperlipidemia, HFpEF, OSA and not using CPAP due to feeling claustrophobic is here for 6 month and also post hospital admission follow up. Patient was admitted to ICU at the Hospital on 02/26/2020 for acute respiratory failure due to acute pulmonary edema in the setting of uncontrolled hypertension and diastolic CHF exacerbation.   Patient presents for 6-week follow-up of hypertension.  At last visit stopped metoprolol succinate and switched patient to Bystolic.  Patient reports that for about 1 week she was confused about her medications and was taking both diastolic and metoprolol, at which time she was experiencing worse shortness of breath.  Since stopping metoprolol and taking only 20 mg Bystolic, patient has been tolerating it well.  She does note a dull ache localized to the left side of her chest.  She reports she has noticed this over the last 1 week, and is worse when moving her arms or lifting things at work, and is tender to palpation.  Of note patient does not consistently check her blood pressure at home.  She continues to have chronic stable dyspnea.  Denies PND, orthopnea.  She is now wearing compression stockings on a regular basis for lower leg edema and has noticed some improvement.   Past Medical History:  Diagnosis Date  . Allergic rhinitis   . Anemia    hematology, Dr. Mohamed, last 01/2011  . Asthma   . Cervix cancer (HCC)   . CHF (congestive heart failure) (HCC) 2004  . Chronic kidney disease    protein in urine  . Complication of anesthesia   .  Dyslipidemia   . Dyspnea    on excertion  . Endometrial cancer (HCC)   . H/O echocardiogram 01/07/04   mild LVH, nl LV systolic function, EF 60%, left atrial enlargement, mildly thickened aortic and mitral valves  . History of thyroid cancer   . Hypertension   . Incarcerated ventral hernia 10/07/2013  . Obesity, Class III, BMI 40-49.9 (morbid obesity) (HCC) 10/09/2013  . Osteopenia   . PONV (postoperative nausea and vomiting)   . Sleep apnea   . Umbilical hernia   . Unspecified essential hypertension 10/09/2013  . Unspecified hypothyroidism 10/09/2013   Past Surgical History:  Procedure Laterality Date  . BONE MARROW BIOPSY    . CARDIAC CATHETERIZATION  2005  . CHOLECYSTECTOMY  2003  . COLONOSCOPY  2008  . ROBOTIC ASSISTED TOTAL HYSTERECTOMY WITH BILATERAL SALPINGO OOPHERECTOMY N/A 10/25/2019   Procedure: XI ROBOTIC ASSISTED TOTAL HYSTERECTOMY WITH BILATERAL SALPINGO OOPHORECTOMY;  Surgeon: Tucker, Katherine R, MD;  Location: [facility];  Service: Gynecology;  Laterality: N/A;  . SENTINEL NODE BIOPSY N/A 10/25/2019   Procedure: SENTINEL LYMPH NODE BIOPSY;  Surgeon: Tucker, Katherine R, MD;  Location: [facility];  Service: Gynecology;  Laterality: N/A;  . THYROID SURGERY     partial thyroidectomy  . TOTAL THYROIDECTOMY  2009  . VENTRAL HERNIA REPAIR N/A 10/07/2013   Procedure: HERNIA REPAIR VENTRAL ADULT;  Surgeon: James O Wyatt, MD;  Location: [facility];  Service: General;  Laterality:   N/A;   Family History  Problem Relation Age of Onset  . Hypertension Mother   . Pancreatic cancer Mother   . Breast cancer Maternal Aunt   . Lung cancer Father   . Ovarian cancer Neg Hx   . Endometrial cancer Neg Hx   . Colon cancer Neg Hx     Social History   Tobacco Use  . Smoking status: Never Smoker  . Smokeless tobacco: Never Used  Substance Use Topics  . Alcohol use: No   Marital Status: Divorced  ROS  Review of Systems  Constitutional: Negative for malaise/fatigue and weight gain.   Cardiovascular: Positive for dyspnea on exertion (chronic, stable) and leg swelling (minimal). Negative for claudication, near-syncope, orthopnea, palpitations, paroxysmal nocturnal dyspnea and syncope.  Respiratory: Positive for snoring (has sleep apnea unable to wear CPAP). Negative for shortness of breath.   Hematologic/Lymphatic: Does not bruise/bleed easily.  Musculoskeletal: Positive for joint pain (knee and hips chronic).  Gastrointestinal: Negative for melena.  Neurological: Negative for dizziness and weakness.   Objective  Blood pressure (!) 144/61, pulse 65, resp. rate 16, height 5' 4" (1.626 m), weight 249 lb (112.9 kg), last menstrual period 10/22/2011, SpO2 97 %.  Vitals with BMI 09/03/2020 08/21/2020 07/23/2020  Height 5' 4" 5' 4" 5' 4"  Weight 249 lbs 249 lbs 250 lbs  BMI 42.72 42.72 42.89  Systolic 144 164 158  Diastolic 61 67 78  Pulse 65 67 75     Physical Exam Constitutional:      General: She is not in acute distress.    Appearance: She is well-developed.     Comments: Morbidly obese  Cardiovascular:     Rate and Rhythm: Normal rate and regular rhythm.     Pulses: Intact distal pulses.     Heart sounds: S1 normal and S2 normal. Murmur heard.  Early systolic murmur is present with a grade of 1/6 at the apex.  No gallop.      Comments: 1-2+ bilateral lower extremity, with support stockings on. No JVD  Pulmonary:     Effort: Pulmonary effort is normal. No accessory muscle usage or respiratory distress.     Breath sounds: Normal breath sounds.  Abdominal:     General: Bowel sounds are normal.     Palpations: Abdomen is soft.     Comments: Pannus present  Musculoskeletal:        General: Tenderness (Left sternal boarder at the 3rd intercostal space) present.    Laboratory examination:   Recent Labs    05/01/20 1420 05/05/20 0809 05/20/20 0931  NA 142 139 140  K 5.2* 5.2* 5.2  CL 103 102 99  CO2 28 27 26  GLUCOSE 94 95 101*  BUN 38* 51* 31*   CREATININE 1.72* 1.93* 1.49*  CALCIUM 10.3 9.7 10.0  GFRNONAA 31* 27* 37*  GFRAA 36* 31* 42*   CrCl cannot be calculated (Patient's most recent lab result is older than the maximum 21 days allowed.).  CMP Latest Ref Rng & Units 05/20/2020 05/05/2020 05/01/2020  Glucose 65 - 99 mg/dL 101(H) 95 94  BUN 8 - 27 mg/dL 31(H) 51(H) 38(H)  Creatinine 0.57 - 1.00 mg/dL 1.49(H) 1.93(H) 1.72(H)  Sodium 134 - 144 mmol/L 140 139 142  Potassium 3.5 - 5.2 mmol/L 5.2 5.2(H) 5.2(H)  Chloride 96 - 106 mmol/L 99 102 103  CO2 20 - 29 mmol/L 26 27 28  Calcium 8.7 - 10.3 mg/dL 10.0 9.7 10.3  Total Protein 6.5 - 8.1 g/dL - - -    Total Bilirubin 0.3 - 1.2 mg/dL - - -  Alkaline Phos 38 - 126 U/L - - -  AST 15 - 41 U/L - - -  ALT 0 - 44 U/L - - -   CBC Latest Ref Rng & Units 05/05/2020 02/25/2020 02/23/2020  WBC 4.0 - 10.5 K/uL 9.2 7.2 7.4  Hemoglobin 12.0 - 15.0 g/dL 8.8(L) 8.9(L) 8.8(L)  Hematocrit 36 - 46 % 27.3(L) 30.4(L) 29.9(L)  Platelets 150 - 400 K/uL 190 175 173   TSH Recent Labs    02/20/20 2149  TSH 1.263   BNP (last 3 results) Recent Labs    02/20/20 1253 05/20/20 0931  BNP 562.6* 82.7   Magnesium:  Recent Labs    02/24/20 0409 02/25/20 0338 02/26/20 0246  MG 1.8 1.8 1.9    External labs:   08/28/2020: Creatinine 1.27, EGFR 44, BUN 19, sodium 146, potassium 4.3  07/17/2020: BUN 15, creatinine 1.28, EGFR 44 mL.  Potassium 3.8, sodium 142. TSH 7.220, mildly elevated.  04/14/2020:  Serum glucose 95 mg, BUN 35, creatinine 1.47, EGFR 37 mL.  02/22/20:  Vitamin B12 347. Iron 26 (L), TIBC 288, Saturation Ratios 9 (L), UIBC 262.    12/05/2019:  Total cholesterol 158, triglycerides 102, HDL 53, LDL 86.    Medications and allergies   Allergies  Allergen Reactions  . Procardia [Nifedipine] Other (See Comments)    Leg edema    Current Outpatient Medications on File Prior to Visit  Medication Sig Dispense Refill  . albuterol (PROVENTIL HFA;VENTOLIN HFA) 108 (90 Base) MCG/ACT  inhaler Inhale 2 puffs into the lungs every 4 (four) hours as needed for wheezing or shortness of breath. 1 Inhaler 0  . aspirin EC 81 MG tablet Take 81 mg by mouth daily.    . Calcium Carbonate-Vitamin D (CALCIUM 600+D PO) Take 1 tablet by mouth daily.    . DULERA 100-5 MCG/ACT AERO Inhale 2 puffs into the lungs in the morning and at bedtime.     . FeFum-FePoly-FA-B Cmp-C-Biot (INTEGRA PLUS) CAPS Take 1 capsule by mouth daily.    Marland Kitchen levothyroxine (SYNTHROID, LEVOTHROID) 150 MCG tablet Take 150 mcg by mouth daily before breakfast.     . loratadine (CLARITIN) 10 MG tablet Take 10 mg by mouth daily.    . Nebivolol HCl (BYSTOLIC) 20 MG TABS Take 1 tablet (20 mg total) by mouth daily. 30 tablet 2  . olopatadine (PATANOL) 0.1 % ophthalmic solution Place 1 drop into both eyes 2 (two) times daily.     . ondansetron (ZOFRAN) 4 MG tablet Take 1 tablet (4 mg total) by mouth every 8 (eight) hours as needed for nausea or vomiting. 20 tablet 0  . rosuvastatin (CRESTOR) 10 MG tablet Take 10 mg by mouth at bedtime.    . senna-docusate (SENOKOT-S) 8.6-50 MG tablet Take 1 tablet by mouth 2 (two) times daily as needed for moderate constipation.    . torsemide (DEMADEX) 20 MG tablet Take 1 tablet (20 mg total) by mouth daily. Take extra one tab for leg swelling 90 tablet 2   No current facility-administered medications on file prior to visit.    Radiology:    CT angiography chest with contrast 02/20/2020: 1. No evidence for acute pulmonary embolus. 2. Patchy ground-glass and consolidative opacities throughout the aerated lungs bilaterally which may represent edema or infection. Additionally, there are scattered areas of consolidation within the lungs bilaterally which may represent atelectasis or infection. Consider follow-up chest CT in 6-8 weeks after resolution of  acute symptomatology to ensure the nodularity resolves. 3. Moderate bilateral pleural effusions. 4. Prominent mediastinal lymph nodes, likely  reactive. 5. Dilated main pulmonary artery as can be seen with pulmonary arterial hypertension. 6. Aortic atherosclerosis.  Cardiac Studies:   Lexiscan Sestamibi Stress Test 01/29/2014: There is a very small and very mild fixed defect in the mid anterior wall consistent with breast attenuation.. No reversible  perfusion defects are identified.  Echocardiogram 08/29/2018:  Left ventricle: The cavity size was normal. Systolic function was   normal. The estimated ejection fraction was in the range of 60%   to 65%. Images were inadequate for LV wall motion assessment. Abnormal septal shudder as well as respiratory related leftward   septal shift. Doppler parameters are consistent with high   ventricular filling pressure (E/e&' = 23). - Aortic valve: There was no stenosis. There was no regurgitation.   Mean gradient (S): 6 mm Hg. Valve area (VTI): 1.89 cm^2. Valve   area (Vmax): 1.77 cm^2. Valve area (Vmean): 1.63 cm^2. - Mitral valve: Calcified annulus.  There was mild regurgitation. - Left atrium: The atrium was moderately to severely dilated. - Right ventricle: The cavity size was normal. Wall thickness was   normal. Systolic function was normal. - Right atrium: The atrium was mildly dilated. - Tricuspid valve: There was trivial regurgitation. - Inferior vena cava: The vessel was dilated. The respirophasic  diameter changes were blunted (< 50%), consistent with elevated   central venous pressure. - Pericardium, extracardiac: A trivial pericardial effusion was   identified anterior to the heart. - Increased flow velocities, may be secondary to anemia,   thyrotoxicosis, or high flow state.  Echocardiogram 02/21/2020: 1. Left ventricular ejection fraction, by estimation, is 60 to 65%. The left ventricle has normal function. The left ventricle has no regional wall motion abnormalities. There is mild concentric left ventricular hypertrophy.  Left ventricular diastolic parameters are consistent with  Grade II diastolic dysfunction (pseudonormalization).  Elevated left ventricular end-diastolic pressure.  2. Right ventricular systolic function is normal. The right ventricular size is normal. There is mildly elevated pulmonary artery systolic pressure.  3. Left atrial size was moderately dilated.  4. The mitral valve is normal in structure. Trivial mitral valve regurgitation. No evidence of mitral stenosis.  5. The aortic valve was not well visualized. Aortic valve regurgitation is not visualized. No aortic stenosis is present.  6. The inferior vena cava is dilated in size with <50% respiratory variability, suggesting right atrial pressure of 15 mmHg.   Lexiscan Sestamibi stress test 03/10/2020: No previous exam available for comparison. Lexiscan nuclear stress test performed using 1-day protocol. Stress EKG is non-diagnostic, as this is pharmacological stress test. Normal myocardial perfusion. Stress LVEF 59%. Low risk study.    EKG     EKG 07/23/2020: Normal sinus rhythm at the rate of 73 bpm, normal axis.  Incomplete right bundle branch block.  Poor R wave progression, cannot exclude anteroseptal infarct old.  Low-voltage complexes.  Pulmonary disease pattern.   No significant change from 02/20/20  Assessment     ICD-10-CM   1. Essential hypertension, benign  I10   2. Stage 3b chronic kidney disease (HCC)  N18.32   3. Class 3 severe obesity due to excess calories without serious comorbidity with body mass index (BMI) of 40.0 to 44.9 in adult (HCC)  E66.01    Z68.41      Meds ordered this encounter  Medications  . amLODipine (NORVASC) 5 MG tablet    Sig: Take   1 tablet (5 mg total) by mouth daily.    Dispense:  30 tablet    Refill:  3    Medications Discontinued During This Encounter  Medication Reason  . mometasone-formoterol (DULERA) 200-5 MCG/ACT AERO Duplicate    Recommendations:   Bridget Lamb  is a 64 y.o. female with morbid obesity BMI 44, hypertension,  restrictive airway disease due to bronchial asthma, hyperlipidemia, HFpEF, OSA and not using CPAP due to feeling claustrophobic is here for 6 month and also post hospital admission follow up. Patient was admitted to ICU at the Hospital on 02/26/2020 for acute respiratory failure due to acute pulmonary edema in the setting of uncontrolled hypertension and diastolic CHF exacerbation.   Patient presents for 6-week follow-up of hypertension, she is now tolerating Bystolic 20 mg well however blood pressure remains above goal.  Patient continues to have bilateral lower leg edema, suspect this is multifactorial including obesity and standing for long periods of time at work.  She is now wearing support stockings which have helped.  Patient's kidney function has remained stable, could consider restarting lisinopril 10 mg.  However patient has an appointment with [facility]ruska [facility] next [facility], will defer decision regarding lisinopril to nephrology.    As we need to be aggressive with hypertension control both for chronic diastolic heart failure and renal protection, will start patient on 5 mg amlodipine daily.  In the past nifedipine was stopped as it was suspected to be contributing to lower leg edema.  However patient is now wearing support stockings regularly and ideally amlodipine will better control blood pressure without worsening leg edema.   Encouraged patient to notify our office if leg edema gets worse with initiation of amlodipine. Also instructed her to monitor blood pressure at home regularly and to bring a log with her to her next visit.   In regard to chest tenderness, suspect musculoskeletal etiology based on symptoms, physical exam, and physical nature of patient's work at [employer]. Discussed with patient regarding signs and symptoms that would warrant urgent evaluation of chest pain.  Again discussed regarding weight loss as well as diet and lifestyle modifications.   Follow up  in 6 weeks.    Celeste C Cantwell, PA-C 09/03/2020, 10:18 AM Office: [phone]  CC: Dr. Lindsey Kruska 

## 2020-09-03 ENCOUNTER — Other Ambulatory Visit: Payer: Self-pay

## 2020-09-03 ENCOUNTER — Ambulatory Visit: Payer: BC Managed Care – PPO | Admitting: Student

## 2020-09-03 ENCOUNTER — Encounter: Payer: Self-pay | Admitting: Student

## 2020-09-03 VITALS — BP 144/61 | HR 65 | Resp 16 | Ht 64.0 in | Wt 249.0 lb

## 2020-09-03 DIAGNOSIS — I10 Essential (primary) hypertension: Secondary | ICD-10-CM

## 2020-09-03 DIAGNOSIS — N1832 Chronic kidney disease, stage 3b: Secondary | ICD-10-CM

## 2020-09-03 MED ORDER — AMLODIPINE BESYLATE 5 MG PO TABS
5.0000 mg | ORAL_TABLET | Freq: Every day | ORAL | 3 refills | Status: DC
Start: 2020-09-03 — End: 2020-10-15

## 2020-09-08 ENCOUNTER — Other Ambulatory Visit: Payer: Self-pay | Admitting: Physician Assistant

## 2020-09-08 DIAGNOSIS — E2839 Other primary ovarian failure: Secondary | ICD-10-CM

## 2020-09-10 ENCOUNTER — Telehealth: Payer: Self-pay | Admitting: Oncology

## 2020-09-10 NOTE — Telephone Encounter (Signed)
Bridget Lamb with appointment to follow up with Dr. Berline Lopes on 10/08/20 at 2:15.  She verbalized understanding and agreement.

## 2020-09-17 ENCOUNTER — Telehealth: Payer: Self-pay | Admitting: Student

## 2020-09-17 NOTE — Telephone Encounter (Signed)
Patient left a message to inform me that her nephrologist is comfortable with restarting lisinopril 10mg . However, I recently started her on amlodipine, will hold off on restarting lisinopril at this time and reevaluate in 1 month at follow up appointment.

## 2020-10-08 ENCOUNTER — Other Ambulatory Visit: Payer: Self-pay

## 2020-10-08 ENCOUNTER — Encounter: Payer: Self-pay | Admitting: Gynecologic Oncology

## 2020-10-08 ENCOUNTER — Inpatient Hospital Stay: Payer: BC Managed Care – PPO | Attending: Gynecologic Oncology | Admitting: Gynecologic Oncology

## 2020-10-08 VITALS — BP 129/48 | HR 60 | Temp 97.8°F | Resp 17 | Ht 64.0 in | Wt 252.8 lb

## 2020-10-08 DIAGNOSIS — E039 Hypothyroidism, unspecified: Secondary | ICD-10-CM | POA: Insufficient documentation

## 2020-10-08 DIAGNOSIS — K5900 Constipation, unspecified: Secondary | ICD-10-CM | POA: Insufficient documentation

## 2020-10-08 DIAGNOSIS — E785 Hyperlipidemia, unspecified: Secondary | ICD-10-CM | POA: Diagnosis not present

## 2020-10-08 DIAGNOSIS — C7982 Secondary malignant neoplasm of genital organs: Secondary | ICD-10-CM | POA: Diagnosis not present

## 2020-10-08 DIAGNOSIS — Z7951 Long term (current) use of inhaled steroids: Secondary | ICD-10-CM | POA: Insufficient documentation

## 2020-10-08 DIAGNOSIS — Z90722 Acquired absence of ovaries, bilateral: Secondary | ICD-10-CM | POA: Insufficient documentation

## 2020-10-08 DIAGNOSIS — I130 Hypertensive heart and chronic kidney disease with heart failure and stage 1 through stage 4 chronic kidney disease, or unspecified chronic kidney disease: Secondary | ICD-10-CM | POA: Diagnosis not present

## 2020-10-08 DIAGNOSIS — Z90710 Acquired absence of both cervix and uterus: Secondary | ICD-10-CM | POA: Diagnosis not present

## 2020-10-08 DIAGNOSIS — Z7982 Long term (current) use of aspirin: Secondary | ICD-10-CM | POA: Insufficient documentation

## 2020-10-08 DIAGNOSIS — J45909 Unspecified asthma, uncomplicated: Secondary | ICD-10-CM | POA: Diagnosis not present

## 2020-10-08 DIAGNOSIS — C541 Malignant neoplasm of endometrium: Secondary | ICD-10-CM | POA: Diagnosis present

## 2020-10-08 DIAGNOSIS — Z923 Personal history of irradiation: Secondary | ICD-10-CM

## 2020-10-08 DIAGNOSIS — Z79899 Other long term (current) drug therapy: Secondary | ICD-10-CM | POA: Insufficient documentation

## 2020-10-08 DIAGNOSIS — R600 Localized edema: Secondary | ICD-10-CM | POA: Diagnosis not present

## 2020-10-08 DIAGNOSIS — Z8541 Personal history of malignant neoplasm of cervix uteri: Secondary | ICD-10-CM | POA: Insufficient documentation

## 2020-10-08 DIAGNOSIS — Z6841 Body Mass Index (BMI) 40.0 and over, adult: Secondary | ICD-10-CM | POA: Diagnosis not present

## 2020-10-08 DIAGNOSIS — Z85850 Personal history of malignant neoplasm of thyroid: Secondary | ICD-10-CM | POA: Insufficient documentation

## 2020-10-08 DIAGNOSIS — I509 Heart failure, unspecified: Secondary | ICD-10-CM | POA: Insufficient documentation

## 2020-10-08 DIAGNOSIS — N189 Chronic kidney disease, unspecified: Secondary | ICD-10-CM | POA: Insufficient documentation

## 2020-10-08 NOTE — Progress Notes (Signed)
Gynecologic Oncology Return Clinic Visit  10/08/2020  Reason for Visit: Surveillance visit after completion of treatment for recurrent uterine cancer  Treatment History: Oncology History Overview Note  CA-125  10/12/19: 48.9  MSI stable   Endometrial cancer ([place])  09/19/2019 Initial Diagnosis   Endometrial cancer ([place])   09/19/2019 Initial Biopsy   EMB: EMCA with clear cell features   10/17/2019 Imaging   CT A/P: Mild endometrial thickening in this patient with known endometrial cancer. No evidence of metastatic disease in the abdomen/pelvis.  CXR 12/28: No evidence of acute cardiopulmonary abnormality.   10/24/2019 Cancer Staging   Staging form: Corpus Uteri - Carcinoma and Carcinosarcoma, AJCC 8th Edition - Clinical stage from 10/24/2019: FIGO Stage IA (cT1a, cN0(sn), cM0) - Signed by Lafonda Mosses, MD on 05/01/2020   10/25/2019 Surgery   TRH/BSO, bil SLN, repair of sulcal tear Findings: On EUA, small cervix, moderately mobile uterus, no adnexal masses.  On intra-abdominal entry, adhesions from the omentum to the anterior wall at the patient's prior hernia repair as well as to the lateral anterior wall bilaterally.  Right aspect of the liver and diaphragm of secured by adhesions.  Left liver edge, diaphragm and stomach normal in appearance.  Uterus 10 cm in bulbous.  Posterior cul-de-sac completely obliterated and bilateral fallopian tubes and adnexa adherent to posterior uterus and sigmoid mesentery.  Fallopian tubes and left ovary somewhat concerning for involvement by tumor versus prior history of pelvic infection or endometriosis.  Mapping successful to bilateral external iliac sentinel lymph nodes.  Small and large bowel normal appearing.  No intra-abdominal evidence of disease.  Significant right sulcal tear noted after delivery of specimen through the vagina, repaired robotically as well as vaginally.   10/25/2019 Cancer Staging   Endometrial cancer, Stage IA, grade 1  endometrioid MI <50%, no LVSI, negative SLNs   05/01/2020 Relapse/Recurrence   A. VAGINAL CUFF, BIOPSY:  - Adenocarcinoma, see comment.   COMMENT:   The adenocarcinoma is similar in appearance to the patient's prior  endometrioid adenocarcinoma ([id number], reviewed).    05/05/2020 Imaging   CT C/A/P: No acute intra-abdominal or pelvic pathology.   05/28/2020 - 07/24/2020 Radiation Therapy    Radiation Treatment Dates: 05/28/2020 through 07/24/2020 Site Technique Total Dose (Gy) Dose per Fx (Gy) Completed Fx Beam Energies  Pelvis: Pelvis IMRT 45/45 1.8 25/25 6X  Vagina: Pelvis_Bst HDR-brachy 24/24 6 4/4 Ir-192        Interval History: Patient finished pelvic radiation with a vaginal boost at the end of September after being diagnosed with recurrence at her cuff.  She saw Dr. Sondra Come on 10/28 and was doing well at that time.  Today, she presents for follow-up and reports feeling very well since finishing radiation.  She had minimal side effects during treatment including occasional nausea.  She denies any significant diarrhea or urinary symptoms.  She denies vaginal bleeding or discharge since finishing treatment.  She endorses having a good appetite.  She has occasional constipation due to some of the medication she takes.  She will take Senokot as needed to help with the symptoms.  She continues to have some difficulty with feeling short of breath with ambulation only when wearing a mask.  She continues to have lower extremity edema, worse when she is on her feet during the day.  When she is working, she typically wears compression socks.  Past Medical/Surgical History: Past Medical History:  Diagnosis Date  . Allergic rhinitis   . Anemia    hematology,  Dr. Julien Nordmann, last 01/2011  . Asthma   . Cervix cancer ([place])   . CHF (congestive heart failure) ([place]) 2004  . Chronic kidney disease    protein in urine  . Complication of anesthesia   . Dyslipidemia   . Dyspnea    on excertion  .  Endometrial cancer ([place])   . H/O echocardiogram 01/07/04   mild LVH, nl LV systolic function, EF 99%, left atrial enlargement, mildly thickened aortic and mitral valves  . History of thyroid cancer   . Hypertension   . Incarcerated ventral hernia 10/07/2013  . Obesity, Class III, BMI 40-49.9 (morbid obesity) ([place]) 10/09/2013  . Osteopenia   . PONV (postoperative nausea and vomiting)   . Sleep apnea   . Umbilical hernia   . Unspecified essential hypertension 10/09/2013  . Unspecified hypothyroidism 10/09/2013    Past Surgical History:  Procedure Laterality Date  . BONE MARROW BIOPSY    . CARDIAC CATHETERIZATION  2005  . CHOLECYSTECTOMY  2003  . COLONOSCOPY  2008  . ROBOTIC ASSISTED TOTAL HYSTERECTOMY WITH BILATERAL SALPINGO OOPHERECTOMY N/A 10/25/2019   Procedure: XI ROBOTIC ASSISTED TOTAL HYSTERECTOMY WITH BILATERAL SALPINGO OOPHORECTOMY;  Surgeon: Lafonda Mosses, MD;  Location: [facility];  Service: Gynecology;  Laterality: N/A;  . SENTINEL NODE BIOPSY N/A 10/25/2019   Procedure: SENTINEL LYMPH NODE BIOPSY;  Surgeon: Lafonda Mosses, MD;  Location: [facility];  Service: Gynecology;  Laterality: N/A;  . THYROID SURGERY     partial thyroidectomy  . TOTAL THYROIDECTOMY  2009  . VENTRAL HERNIA REPAIR N/A 10/07/2013   Procedure: HERNIA REPAIR VENTRAL ADULT;  Surgeon: Gwenyth Ober, MD;  Location: [facility];  Service: General;  Laterality: N/A;    Family History  Problem Relation Age of Onset  . Hypertension Mother   . Pancreatic cancer Mother   . Breast cancer Maternal Aunt   . Lung cancer Father   . Ovarian cancer Neg Hx   . Endometrial cancer Neg Hx   . Colon cancer Neg Hx     Social History   Socioeconomic History  . Marital status: Divorced    Spouse name: Not on file  . Number of children: 1  . Years of education: Not on file  . Highest education level: Not on file  Occupational History  . Not on file  Tobacco Use  . Smoking status: Never Smoker  . Smokeless  tobacco: Never Used  Vaping Use  . Vaping Use: Never used  Substance and Sexual Activity  . Alcohol use: No  . Drug use: No  . Sexual activity: Not on file  Other Topics Concern  . Not on file  Social History Narrative   Works at the [employer] at [employer], is on her feet for 8-hour shifts   Social Determinants of Health   Financial Resource Strain: Not on file  Food Insecurity: Not on file  Transportation Needs: Not on file  Physical Activity: Not on file  Stress: Not on file  Social Connections: Not on file    Current Medications:  Current Outpatient Medications:  .  amLODipine (NORVASC) 5 MG tablet, Take 1 tablet (5 mg total) by mouth daily., Disp: 30 tablet, Rfl: 3 .  aspirin EC 81 MG tablet, Take 81 mg by mouth daily., Disp: , Rfl:  .  Calcium Carbonate-Vitamin D (CALCIUM 600+D PO), Take 1 tablet by mouth 2 (two) times daily., Disp: , Rfl:  .  DULERA 100-5 MCG/ACT AERO, Inhale 2 puffs into the  lungs in the morning and at bedtime. , Disp: , Rfl:  .  FeFum-FePoly-FA-B Cmp-C-Biot (INTEGRA PLUS) CAPS, Take 1 capsule by mouth 2 (two) times daily., Disp: , Rfl:  .  levothyroxine (SYNTHROID, LEVOTHROID) 150 MCG tablet, Take 150 mcg by mouth daily before breakfast. , Disp: , Rfl:  .  loratadine (CLARITIN) 10 MG tablet, Take 10 mg by mouth daily., Disp: , Rfl:  .  Nebivolol HCl (BYSTOLIC) 20 MG TABS, Take 1 tablet (20 mg total) by mouth daily., Disp: 30 tablet, Rfl: 2 .  olopatadine (PATANOL) 0.1 % ophthalmic solution, Place 1 drop into both eyes 2 (two) times daily. , Disp: , Rfl:  .  rosuvastatin (CRESTOR) 10 MG tablet, Take 10 mg by mouth at bedtime., Disp: , Rfl:  .  torsemide (DEMADEX) 20 MG tablet, Take 1 tablet (20 mg total) by mouth daily. Take extra one tab for leg swelling, Disp: 90 tablet, Rfl: 2 .  albuterol (PROVENTIL HFA;VENTOLIN HFA) 108 (90 Base) MCG/ACT inhaler, Inhale 2 puffs into the lungs every 4 (four) hours as needed for wheezing or shortness of breath.  (Patient not taking: Reported on 10/08/2020), Disp: 1 Inhaler, Rfl: 0 .  senna-docusate (SENOKOT-S) 8.6-50 MG tablet, Take 1 tablet by mouth 2 (two) times daily as needed for moderate constipation. (Patient not taking: Reported on 10/08/2020), Disp:  , Rfl:   Review of Systems: Pertinent positives include shortness of breath, leg swelling, joint pain, headaches Denies appetite changes, fevers, chills, fatigue, unexplained weight changes. Denies hearing loss, neck lumps or masses, mouth sores, ringing in ears or voice changes. Denies cough or wheezing.  Denies chest pain or palpitations.  Denies abdominal distention, pain, blood in stools, constipation, diarrhea, nausea, vomiting, or early satiety. Denies pain with intercourse, dysuria, frequency, hematuria or incontinence. Denies hot flashes, pelvic pain, vaginal bleeding or vaginal discharge.   Denies back pain or muscle pain/cramps. Denies itching, rash, or wounds. Denies dizziness, numbness or seizures. Denies swollen lymph nodes or glands, denies easy bruising or bleeding. Denies anxiety, depression, confusion, or decreased concentration.  Physical Exam: BP (!) 129/48 (BP Location: Left Arm, Patient Position: Sitting)   Pulse 60   Temp 97.8 F (36.6 C) (Tympanic)   Resp 17   Ht 5' 4" (1.626 m)   Wt 252 lb 12.8 oz (114.7 kg)   LMP 10/22/2011   SpO2 97%   BMI 43.39 kg/m  General: Alert, oriented, no acute distress. HEENT: Posterior oropharynx clear, sclera anicteric. Chest: Unlabored breathing on room air. Abdomen: Obese, soft, nontender.  Normoactive bowel sounds.  No masses or hepatosplenomegaly appreciated.  Well-healed incisions. Extremities: Grossly normal range of motion.  Warm, well perfused.  1-2+ edema bilaterally. Skin: No rashes or lesions noted. Lymphatics: No cervical, supraclavicular, or inguinal adenopathy. GU: Normal appearing external genitalia without erythema, excoriation, or lesions.  Speculum exam reveals  mildly atrophic vaginal mucosa, radiation changes noted especially at the apex.  No lesions or masses seen.  No bleeding or discharge.  Bimanual exam reveals cuff smooth, no nodularity or masses.  Rectovaginal exam confirms these findings.  Laboratory & Radiologic Studies: None new  Assessment & Plan: Bridget Lamb is a 64 y.o. woman with early stage uterine cancer who presented in July of this year with vaginal bleeding found to have a recurrence at the vaginal cuff, now approximately 2 and half months status post radiation.  Patient is doing very well since completing treatment.  She is NED on exam today.  We discussed signs and  symptoms that would be concerning for disease recurrence and she knows to call the clinic if she develops any of these.  Otherwise, we will plan on visits every 3 months alternating between my clinic and radiation oncology.  She is scheduled to be seen in radiation oncology in March and will follow up with me in June.  Given no findings on her imaging at the time of recurrence diagnosis, I do not think that any imaging is indicated now that she has finished treatment.  25 minutes of total time was spent for this patient encounter, including preparation, face-to-face counseling with the patient and coordination of care, and documentation of the encounter.  Jeral Pinch, MD  Division of Gynecologic Oncology  Department of Obstetrics and Gynecology  [facility]

## 2020-10-08 NOTE — Patient Instructions (Signed)
Everything looks normal on your exam today!  You are scheduled to go back to radiation oncology for follow-up in 3 months.  I will see you in June.  If you develop any new symptoms, like vaginal bleeding, discharge, pelvic pain, unintentional weight loss, or change to your bowel function, please call the clinic to be seen sooner.

## 2020-10-13 NOTE — Progress Notes (Signed)
Primary Physician/Referring:  [name], PA-C  Patient ID: Bridget Lamb, female    DOB: [birth year], 64 y.o.   MRN: [id number]  Chief Complaint  Patient presents with  . Hypertension  . Follow-up   HPI:    Bridget Lamb  is a 64 y.o. female with morbid obesity BMI 44, hypertension, restrictive airway disease due to bronchial asthma, hyperlipidemia, HFpEF, OSA and not using CPAP due to feeling claustrophobic is here for 6 month and also post hospital admission follow up. Patient was admitted to ICU at the Hospital on 02/26/2020 for acute respiratory failure due to acute pulmonary edema in the setting of uncontrolled hypertension and diastolic CHF exacerbation, she has relatively recuperated well.   Pateint presents for 6 week follow up of hypertension. At last visit started amlodipine 5 mg daily. Since last visit she was seen by her nephrologist who is comfortable restarting lisinopril if needed for further blood pressure control. She has been feeling well since last visit. Denies chest pain, dizziness, dyspnea, PND, orthopnea. She continues to have stable chronic lower leg edema for which she wears compression stockings on a regular basis. She reports home blood pressure readings averaging >409 mmHg systolic.   Past Medical History:  Diagnosis Date  . Allergic rhinitis   . Anemia    hematology, Dr. Julien Nordmann, last 01/2011  . Asthma   . Cervix cancer ([place])   . CHF (congestive heart failure) ([place]) 2004  . Chronic kidney disease    protein in urine  . Complication of anesthesia   . Dyslipidemia   . Dyspnea    on excertion  . Endometrial cancer ([place])   . H/O echocardiogram 01/07/04   mild LVH, nl LV systolic function, EF 81%, left atrial enlargement, mildly thickened aortic and mitral valves  . History of thyroid cancer   . Hypertension   . Incarcerated ventral hernia 10/07/2013  . Obesity, Class III, BMI 40-49.9 (morbid obesity) ([place]) 10/09/2013  . Osteopenia   .  PONV (postoperative nausea and vomiting)   . Sleep apnea   . Umbilical hernia   . Unspecified essential hypertension 10/09/2013  . Unspecified hypothyroidism 10/09/2013   Past Surgical History:  Procedure Laterality Date  . BONE MARROW BIOPSY    . CARDIAC CATHETERIZATION  2005  . CHOLECYSTECTOMY  2003  . COLONOSCOPY  2008  . ROBOTIC ASSISTED TOTAL HYSTERECTOMY WITH BILATERAL SALPINGO OOPHERECTOMY N/A 10/25/2019   Procedure: XI ROBOTIC ASSISTED TOTAL HYSTERECTOMY WITH BILATERAL SALPINGO OOPHORECTOMY;  Surgeon: Lafonda Mosses, MD;  Location: [facility];  Service: Gynecology;  Laterality: N/A;  . SENTINEL NODE BIOPSY N/A 10/25/2019   Procedure: SENTINEL LYMPH NODE BIOPSY;  Surgeon: Lafonda Mosses, MD;  Location: [facility];  Service: Gynecology;  Laterality: N/A;  . THYROID SURGERY     partial thyroidectomy  . TOTAL THYROIDECTOMY  2009  . VENTRAL HERNIA REPAIR N/A 10/07/2013   Procedure: HERNIA REPAIR VENTRAL ADULT;  Surgeon: Gwenyth Ober, MD;  Location: [facility];  Service: General;  Laterality: N/A;   Family History  Problem Relation Age of Onset  . Hypertension Mother   . Pancreatic cancer Mother   . Breast cancer Maternal Aunt   . Lung cancer Father   . Ovarian cancer Neg Hx   . Endometrial cancer Neg Hx   . Colon cancer Neg Hx     Social History   Tobacco Use  . Smoking status: Never Smoker  . Smokeless tobacco: Never Used  Substance Use Topics  .  Alcohol use: No   Marital Status: Divorced  ROS  Review of Systems  Constitutional: Negative for malaise/fatigue and weight gain.  Cardiovascular: Positive for dyspnea on exertion (chronic, stable) and leg swelling (minimal). Negative for chest pain, claudication, near-syncope, orthopnea, palpitations, paroxysmal nocturnal dyspnea and syncope.  Respiratory: Positive for snoring (has sleep apnea unable to wear CPAP). Negative for shortness of breath.   Hematologic/Lymphatic: Does not bruise/bleed easily.  Musculoskeletal:  Positive for joint pain (knee and hips chronic).  Gastrointestinal: Negative for melena.  Neurological: Negative for dizziness and weakness.   Objective  Blood pressure (!) 142/66, pulse 66, height $RemoveBe'5\' 4"'tlvkhjCmE$  (1.626 m), weight 249 lb 6.4 oz (113.1 kg), last menstrual period 10/22/2011.  Vitals with BMI 10/15/2020 10/08/2020 09/03/2020  Height $Remov'5\' 4"'zjiOks$  $Remove'5\' 4"'sOoAlpS$  $RemoveB'5\' 4"'TqLhIIty$   Weight 249 lbs 6 oz 252 lbs 13 oz 249 lbs  BMI 42.79 21.97 58.83  Systolic 254 982 641  Diastolic 66 48 61  Pulse 66 60 65     Physical Exam Vitals reviewed.  Constitutional:      General: She is not in acute distress.    Appearance: She is well-developed.     Comments: Morbidly obese  HENT:     Head: Normocephalic and atraumatic.  Cardiovascular:     Rate and Rhythm: Normal rate and regular rhythm.     Pulses: Intact distal pulses.     Heart sounds: S1 normal and S2 normal. Murmur heard.   Early systolic murmur is present with a grade of 1/6 at the apex. No gallop.      Comments: 1-2+ bilateral lower extremity, with support stockings on. No JVD  Pulmonary:     Effort: Pulmonary effort is normal. No accessory muscle usage or respiratory distress.     Breath sounds: Normal breath sounds. No wheezing, rhonchi or rales.  Abdominal:     General: Bowel sounds are normal.     Palpations: Abdomen is soft.     Comments: Pannus present  Musculoskeletal:        General: No tenderness.  Neurological:     Mental Status: She is alert.    Laboratory examination:   Recent Labs    05/01/20 1420 05/05/20 0809 05/20/20 0931  NA 142 139 140  K 5.2* 5.2* 5.2  CL 103 102 99  CO2 $Re'28 27 26  'WlH$ GLUCOSE 94 95 101*  BUN 38* 51* 31*  CREATININE 1.72* 1.93* 1.49*  CALCIUM 10.3 9.7 10.0  GFRNONAA 31* 27* 37*  GFRAA 36* 31* 42*   CrCl cannot be calculated (Patient's most recent lab result is older than the maximum 21 days allowed.).  CMP Latest Ref Rng & Units 05/20/2020 05/05/2020 05/01/2020  Glucose 65 - 99 mg/dL 101(H) 95 94  BUN 8 -  27 mg/dL 31(H) 51(H) 38(H)  Creatinine 0.57 - 1.00 mg/dL 1.49(H) 1.93(H) 1.72(H)  Sodium 134 - 144 mmol/L 140 139 142  Potassium 3.5 - 5.2 mmol/L 5.2 5.2(H) 5.2(H)  Chloride 96 - 106 mmol/L 99 102 103  CO2 20 - 29 mmol/L $RemoveB'26 27 28  'TkeDsRkf$ Calcium 8.7 - 10.3 mg/dL 10.0 9.7 10.3  Total Protein 6.5 - 8.1 g/dL - - -  Total Bilirubin 0.3 - 1.2 mg/dL - - -  Alkaline Phos 38 - 126 U/L - - -  AST 15 - 41 U/L - - -  ALT 0 - 44 U/L - - -   CBC Latest Ref Rng & Units 05/05/2020 02/25/2020 02/23/2020  WBC 4.0 - 10.5 K/uL 9.2 7.2  7.4  Hemoglobin 12.0 - 15.0 g/dL 8.8(L) 8.9(L) 8.8(L)  Hematocrit 36.0 - 46.0 % 27.3(L) 30.4(L) 29.9(L)  Platelets 150 - 400 K/uL 190 175 173   TSH Recent Labs    02/20/20 2149  TSH 1.263   BNP (last 3 results) Recent Labs    02/20/20 1253 05/20/20 0931  BNP 562.6* 82.7   Magnesium:  Recent Labs    02/24/20 0409 02/25/20 0338 02/26/20 0246  MG 1.8 1.8 1.9    External labs:   08/28/2020: Creatinine 1.27, EGFR 44, BUN 19, sodium 146, potassium 4.3  07/17/2020: BUN 15, creatinine 1.28, EGFR 44 mL.  Potassium 3.8, sodium 142. TSH 7.220, mildly elevated.  04/14/2020:  Serum glucose 95 mg, BUN 35, creatinine 1.47, EGFR 37 mL.  02/22/20:  Vitamin B12 347. Iron 26 (L), TIBC 288, Saturation Ratios 9 (L), UIBC 262.    12/05/2019:  Total cholesterol 158, triglycerides 102, HDL 53, LDL 86.    Medications and allergies   Allergies  Allergen Reactions  . Procardia [Nifedipine] Other (See Comments)    Ineffective     Current Outpatient Medications on File Prior to Visit  Medication Sig Dispense Refill  . albuterol (PROVENTIL HFA;VENTOLIN HFA) 108 (90 Base) MCG/ACT inhaler Inhale 2 puffs into the lungs every 4 (four) hours as needed for wheezing or shortness of breath. 1 Inhaler 0  . aspirin EC 81 MG tablet Take 81 mg by mouth daily.    . Calcium Carbonate-Vitamin D (CALCIUM 600+D PO) Take 1 tablet by mouth 2 (two) times daily.    . DULERA 100-5 MCG/ACT AERO  Inhale 2 puffs into the lungs in the morning and at bedtime.     . FeFum-FePoly-FA-B Cmp-C-Biot (INTEGRA PLUS) CAPS Take 1 capsule by mouth 2 (two) times daily.    Marland Kitchen levothyroxine (SYNTHROID, LEVOTHROID) 150 MCG tablet Take 150 mcg by mouth daily before breakfast.     . loratadine (CLARITIN) 10 MG tablet Take 10 mg by mouth daily.    . Nebivolol HCl (BYSTOLIC) 20 MG TABS Take 1 tablet (20 mg total) by mouth daily. 30 tablet 2  . olopatadine (PATANOL) 0.1 % ophthalmic solution Place 1 drop into both eyes 2 (two) times daily.     . rosuvastatin (CRESTOR) 10 MG tablet Take 10 mg by mouth at bedtime.    . senna-docusate (SENOKOT-S) 8.6-50 MG tablet Take 1 tablet by mouth 2 (two) times daily as needed for moderate constipation.    . torsemide (DEMADEX) 20 MG tablet Take 1 tablet (20 mg total) by mouth daily. Take extra one tab for leg swelling 90 tablet 2   No current facility-administered medications on file prior to visit.    Radiology:    CT angiography chest with contrast 02/20/2020: 1. No evidence for acute pulmonary embolus. 2. Patchy ground-glass and consolidative opacities throughout the aerated lungs bilaterally which may represent edema or infection. Additionally, there are scattered areas of consolidation within the lungs bilaterally which may represent atelectasis or infection. Consider follow-up chest CT in 6-8 weeks after resolution of acute symptomatology to ensure the nodularity resolves. 3. Moderate bilateral pleural effusions. 4. Prominent mediastinal lymph nodes, likely reactive. 5. Dilated main pulmonary artery as can be seen with pulmonary arterial hypertension. 6. Aortic atherosclerosis.  Cardiac Studies:   Lexiscan Sestamibi Stress Test 01/29/2014: There is a very small and very mild fixed defect in the mid anterior wall consistent with breast attenuation.. No reversible  perfusion defects are identified.  Echocardiogram 08/29/2018:  Left ventricle: The  cavity size was  normal. Systolic function was   normal. The estimated ejection fraction was in the range of 60%   to 65%. Images were inadequate for LV wall motion assessment. Abnormal septal shudder as well as respiratory related leftward   septal shift. Doppler parameters are consistent with high   ventricular filling pressure (E/e&' = 23). - Aortic valve: There was no stenosis. There was no regurgitation.   Mean gradient (S): 6 mm Hg. Valve area (VTI): 1.89 cm^2. Valve   area (Vmax): 1.77 cm^2. Valve area (Vmean): 1.63 cm^2. - Mitral valve: Calcified annulus.  There was mild regurgitation. - Left atrium: The atrium was moderately to severely dilated. - Right ventricle: The cavity size was normal. Wall thickness was   normal. Systolic function was normal. - Right atrium: The atrium was mildly dilated. - Tricuspid valve: There was trivial regurgitation. - Inferior vena cava: The vessel was dilated. The respirophasic  diameter changes were blunted (< 50%), consistent with elevated   central venous pressure. - Pericardium, extracardiac: A trivial pericardial effusion was   identified anterior to the heart. - Increased flow velocities, may be secondary to anemia,   thyrotoxicosis, or high flow state.  Echocardiogram 02/21/2020: 1. Left ventricular ejection fraction, by estimation, is 60 to 65%. The left ventricle has normal function. The left ventricle has no regional wall motion abnormalities. There is mild concentric left ventricular hypertrophy.  Left ventricular diastolic parameters are consistent with Grade II diastolic dysfunction (pseudonormalization).  Elevated left ventricular end-diastolic pressure.  2. Right ventricular systolic function is normal. The right ventricular size is normal. There is mildly elevated pulmonary artery systolic pressure.  3. Left atrial size was moderately dilated.  4. The mitral valve is normal in structure. Trivial mitral valve regurgitation. No evidence of mitral stenosis.  5.  The aortic valve was not well visualized. Aortic valve regurgitation is not visualized. No aortic stenosis is present.  6. The inferior vena cava is dilated in size with <50% respiratory variability, suggesting right atrial pressure of 15 mmHg.   Lexiscan Sestamibi stress test 03/10/2020: No previous exam available for comparison. Lexiscan nuclear stress test performed using 1-day protocol. Stress EKG is non-diagnostic, as this is pharmacological stress test. Normal myocardial perfusion. Stress LVEF 59%. Low risk study.    EKG     EKG 07/23/2020: Normal sinus rhythm at the rate of 73 bpm, normal axis.  Incomplete right bundle branch block.  Poor R wave progression, cannot exclude anteroseptal infarct old.  Low-voltage complexes.  Pulmonary disease pattern.   No significant change from 02/20/20  Assessment     ICD-10-CM   1. Essential hypertension, benign  I10   2. Stage 3b chronic kidney disease (HCC)  N18.32   3. Class 3 severe obesity due to excess calories without serious comorbidity with body mass index (BMI) of 40.0 to 44.9 in adult (HCC)  E66.01    Z68.41   4. Chronic diastolic CHF (congestive heart failure) (HCC)  I50.32      Meds ordered this encounter  Medications  . DISCONTD: amLODipine (NORVASC) 5 MG tablet    Sig: Take 2 tablets (10 mg total) by mouth daily.    Dispense:  30 tablet    Refill:  3  . amLODipine (NORVASC) 5 MG tablet    Sig: Take 2 tablets (10 mg total) by mouth at bedtime.    Dispense:  30 tablet    Refill:  3    Medications Discontinued During This Encounter  Medication  Reason  . amLODipine (NORVASC) 5 MG tablet   . amLODipine (NORVASC) 5 MG tablet     Recommendations:   Bridget Lamb  is a 64 y.o. female with morbid obesity BMI 44, hypertension, restrictive airway disease due to bronchial asthma, hyperlipidemia, HFpEF, OSA and not using CPAP due to feeling claustrophobic is here for 6 month and also post hospital admission follow up. Patient  was admitted to ICU at the Hospital on 02/26/2020 for acute respiratory failure due to acute pulmonary edema in the setting of uncontrolled hypertension and diastolic CHF exacerbation she has relatively recuperated well.   Patient presents for 6 week follow up of hypertension. Home blood pressure readings have improved some with amlodipine, but remain above goal. Discussed with patient regarding increasing amlodipine vs adding lisinopril for further improvement of blood pressure control. Discussed ineffectiveness of nifedipine for patient in the past as well as increased leg swelling as a side effect of amlodipine. Discussed risks and benefits of restarting lisinopril at this time as patient's nephrologist is okay with this if needed. Patient prefers to hold off on restarting lisinopril 10 mg daily at this time. Shared decision making was used to proceed with increasing amlodipine from 5 mg to 10 mg daily. Patient has had no recurrence of musculoskeletal chest pain since last visit.   Again discussed regarding weight loss as well as diet and lifestyle modifications.   Follow up for hypertension in 8 weeks.    Alethia Berthold, PA-C 10/15/2020, 1:06 PM Office: [phone]

## 2020-10-15 ENCOUNTER — Ambulatory Visit: Payer: BC Managed Care – PPO | Admitting: Student

## 2020-10-15 ENCOUNTER — Encounter: Payer: Self-pay | Admitting: Student

## 2020-10-15 ENCOUNTER — Other Ambulatory Visit: Payer: Self-pay

## 2020-10-15 VITALS — BP 142/66 | HR 66 | Ht 64.0 in | Wt 249.4 lb

## 2020-10-15 DIAGNOSIS — I10 Essential (primary) hypertension: Secondary | ICD-10-CM

## 2020-10-15 DIAGNOSIS — Z6841 Body Mass Index (BMI) 40.0 and over, adult: Secondary | ICD-10-CM

## 2020-10-15 DIAGNOSIS — N1832 Chronic kidney disease, stage 3b: Secondary | ICD-10-CM

## 2020-10-15 DIAGNOSIS — I5032 Chronic diastolic (congestive) heart failure: Secondary | ICD-10-CM

## 2020-10-15 MED ORDER — AMLODIPINE BESYLATE 5 MG PO TABS
10.0000 mg | ORAL_TABLET | Freq: Every evening | ORAL | 3 refills | Status: DC
Start: 2020-10-15 — End: 2020-12-30

## 2020-10-15 MED ORDER — AMLODIPINE BESYLATE 5 MG PO TABS
10.0000 mg | ORAL_TABLET | Freq: Every day | ORAL | 3 refills | Status: DC
Start: 2020-10-15 — End: 2020-10-15

## 2020-11-13 ENCOUNTER — Other Ambulatory Visit: Payer: Self-pay | Admitting: Cardiology

## 2020-11-13 DIAGNOSIS — I5032 Chronic diastolic (congestive) heart failure: Secondary | ICD-10-CM

## 2020-11-13 DIAGNOSIS — I10 Essential (primary) hypertension: Secondary | ICD-10-CM

## 2020-12-09 NOTE — Progress Notes (Signed)
Primary Physician/Referring:  [name], PA-C  Patient ID: Bridget Lamb, female    DOB: 24-Apr-1956, 65 y.o.   MRN: [id number]  Chief Complaint  Patient presents with  . Hypertension    Chf  . Congestive Heart Failure   HPI:    Bridget Lamb  is a 65 y.o. female with morbid obesity BMI 44, hypertension, restrictive airway disease due to bronchial asthma, hyperlipidemia, HFpEF, OSA and not using CPAP due to feeling claustrophobic.  Patient was admitted to ICU at the Hospital on 02/26/2020 for acute respiratory failure due to acute pulmonary edema in the setting of uncontrolled hypertension and diastolic CHF exacerbation, she has relatively recuperated well.   Patient presents for 8-week follow-up of hypertension.  At last visit increased amlodipine from 5 mg to 10 mg daily.  Unfortunately patient has not been monitoring her blood pressure on a regular uses at home.  However in the office today and at her PCP office last week blood pressures well controlled.  However patient has noticed over the last 2 weeks increasing leg swelling as well as orthopnea and shortness of breath.  She advised by her PCP last week to increase torsemide to 20 mg twice daily as BNP was elevated.  Patient has noticed mild improvement of leg swelling over the last 3 days since increasing torsemide.  Denies chest pain, dizziness, PND.  Past Medical History:  Diagnosis Date  . Allergic rhinitis   . Anemia    hematology, Dr. Julien Nordmann, last 01/2011  . Asthma   . Cervix cancer ([place])   . CHF (congestive heart failure) ([place]) 2004  . Chronic kidney disease    protein in urine  . Complication of anesthesia   . Dyslipidemia   . Dyspnea    on excertion  . Endometrial cancer ([place])   . H/O echocardiogram 01/07/04   mild LVH, nl LV systolic function, EF 76%, left atrial enlargement, mildly thickened aortic and mitral valves  . History of thyroid cancer   . Hypertension   . Incarcerated ventral hernia  10/07/2013  . Obesity, Class III, BMI 40-49.9 (morbid obesity) ([place]) 10/09/2013  . Osteopenia   . PONV (postoperative nausea and vomiting)   . Sleep apnea   . Umbilical hernia   . Unspecified essential hypertension 10/09/2013  . Unspecified hypothyroidism 10/09/2013   Past Surgical History:  Procedure Laterality Date  . BONE MARROW BIOPSY    . CARDIAC CATHETERIZATION  2005  . CHOLECYSTECTOMY  2003  . COLONOSCOPY  2008  . ROBOTIC ASSISTED TOTAL HYSTERECTOMY WITH BILATERAL SALPINGO OOPHERECTOMY N/A 10/25/2019   Procedure: XI ROBOTIC ASSISTED TOTAL HYSTERECTOMY WITH BILATERAL SALPINGO OOPHORECTOMY;  Surgeon: Lafonda Mosses, MD;  Location: [facility];  Service: Gynecology;  Laterality: N/A;  . SENTINEL NODE BIOPSY N/A 10/25/2019   Procedure: SENTINEL LYMPH NODE BIOPSY;  Surgeon: Lafonda Mosses, MD;  Location: [facility];  Service: Gynecology;  Laterality: N/A;  . THYROID SURGERY     partial thyroidectomy  . TOTAL THYROIDECTOMY  2009  . VENTRAL HERNIA REPAIR N/A 10/07/2013   Procedure: HERNIA REPAIR VENTRAL ADULT;  Surgeon: Gwenyth Ober, MD;  Location: [facility];  Service: General;  Laterality: N/A;   Family History  Problem Relation Age of Onset  . Hypertension Mother   . Pancreatic cancer Mother   . Breast cancer Maternal Aunt   . Lung cancer Father   . Ovarian cancer Neg Hx   . Endometrial cancer Neg Hx   . Colon cancer  Neg Hx     Social History   Tobacco Use  . Smoking status: Never Smoker  . Smokeless tobacco: Never Used  Substance Use Topics  . Alcohol use: No   Marital Status: Divorced  ROS  Review of Systems  Constitutional: Negative for malaise/fatigue and weight gain.  Cardiovascular: Positive for dyspnea on exertion (chronic, stable), leg swelling (minimal) and orthopnea. Negative for chest pain, claudication, near-syncope, palpitations, paroxysmal nocturnal dyspnea and syncope.  Respiratory: Positive for shortness of breath and snoring (has sleep apnea unable to  wear CPAP).   Hematologic/Lymphatic: Does not bruise/bleed easily.  Musculoskeletal: Positive for joint pain (knee and hips chronic).  Gastrointestinal: Negative for melena.  Neurological: Negative for dizziness and weakness.   Objective  Blood pressure 124/62, pulse 64, temperature 97.9 F (36.6 C), height $RemoveBe'5\' 4"'jDwMgaQNJ$  (1.626 m), weight 248 lb (112.5 kg), last menstrual period 10/22/2011, SpO2 99 %.  Vitals with BMI 12/10/2020 10/15/2020 10/08/2020  Height $Remov'5\' 4"'rwDJTY$  $Remove'5\' 4"'OoMYalE$  $RemoveB'5\' 4"'WyIJzrws$   Weight 248 lbs 249 lbs 6 oz 252 lbs 13 oz  BMI 42.55 16.10 96.04  Systolic 540 981 191  Diastolic 62 66 48  Pulse 64 66 60     Physical Exam Vitals reviewed.  Constitutional:      General: She is not in acute distress.    Appearance: She is well-developed.     Comments: Morbidly obese  HENT:     Head: Normocephalic and atraumatic.  Cardiovascular:     Rate and Rhythm: Normal rate and regular rhythm.     Pulses: Intact distal pulses.     Heart sounds: S1 normal and S2 normal. Murmur heard.   Early systolic murmur is present with a grade of 1/6 at the apex.  Harsh mid to late systolic murmuris also present at the upper right sternal border. No gallop.      Comments: Wearing support stockings. No JVD  Pulmonary:     Effort: Pulmonary effort is normal. No accessory muscle usage or respiratory distress.     Breath sounds: Normal breath sounds. No wheezing, rhonchi or rales.  Abdominal:     General: Bowel sounds are normal.     Palpations: Abdomen is soft.     Comments: Pannus present  Musculoskeletal:        General: No tenderness.     Right lower leg: Edema (1-2+ pitting ) present.     Left lower leg: Edema (1-2+ pitting ) present.  Neurological:     Mental Status: She is alert.    Laboratory examination:   Recent Labs    05/01/20 1420 05/05/20 0809 05/20/20 0931  NA 142 139 140  K 5.2* 5.2* 5.2  CL 103 102 99  CO2 $Re'28 27 26  'ylN$ GLUCOSE 94 95 101*  BUN 38* 51* 31*  CREATININE 1.72* 1.93* 1.49*   CALCIUM 10.3 9.7 10.0  GFRNONAA 31* 27* 37*  GFRAA 36* 31* 42*   CrCl cannot be calculated (Patient's most recent lab result is older than the maximum 21 days allowed.).  CMP Latest Ref Rng & Units 05/20/2020 05/05/2020 05/01/2020  Glucose 65 - 99 mg/dL 101(H) 95 94  BUN 8 - 27 mg/dL 31(H) 51(H) 38(H)  Creatinine 0.57 - 1.00 mg/dL 1.49(H) 1.93(H) 1.72(H)  Sodium 134 - 144 mmol/L 140 139 142  Potassium 3.5 - 5.2 mmol/L 5.2 5.2(H) 5.2(H)  Chloride 96 - 106 mmol/L 99 102 103  CO2 20 - 29 mmol/L $RemoveB'26 27 28  'PrvsOZWp$ Calcium 8.7 - 10.3 mg/dL 10.0 9.7  10.3  Total Protein 6.5 - 8.1 g/dL - - -  Total Bilirubin 0.3 - 1.2 mg/dL - - -  Alkaline Phos 38 - 126 U/L - - -  AST 15 - 41 U/L - - -  ALT 0 - 44 U/L - - -   CBC Latest Ref Rng & Units 05/05/2020 02/25/2020 02/23/2020  WBC 4.0 - 10.5 K/uL 9.2 7.2 7.4  Hemoglobin 12.0 - 15.0 g/dL 8.8(L) 8.9(L) 8.8(L)  Hematocrit 36.0 - 46.0 % 27.3(L) 30.4(L) 29.9(L)  Platelets 150 - 400 K/uL 190 175 173   TSH Recent Labs    02/20/20 2149  TSH 1.263   BNP (last 3 results) Recent Labs    02/20/20 1253 05/20/20 0931  BNP 562.6* 82.7   Magnesium:  Recent Labs    02/24/20 0409 02/25/20 0338 02/26/20 0246  MG 1.8 1.8 1.9    External labs:  12/09/2020: proBNP 1805 Total cholesterol 120, triglycerides 82, HDL 48, LDL 56, non-HDL 72 Glucose 100, BUN 25, creatinine 1.5, GFR 36, sodium 144, potassium 4.6  08/28/2020: Creatinine 1.27, EGFR 44, BUN 19, sodium 146, potassium 4.3  07/17/2020: BUN 15, creatinine 1.28, EGFR 44 mL.  Potassium 3.8, sodium 142. TSH 7.220, mildly elevated.  04/14/2020:  Serum glucose 95 mg, BUN 35, creatinine 1.47, EGFR 37 mL.  02/22/20:  Vitamin B12 347. Iron 26 (L), TIBC 288, Saturation Ratios 9 (L), UIBC 262.    12/05/2019:  Total cholesterol 158, triglycerides 102, HDL 53, LDL 86.    Medications and allergies   Allergies  Allergen Reactions  . Procardia [Nifedipine] Other (See Comments)    Ineffective     Current  Outpatient Medications on File Prior to Visit  Medication Sig Dispense Refill  . albuterol (PROVENTIL HFA;VENTOLIN HFA) 108 (90 Base) MCG/ACT inhaler Inhale 2 puffs into the lungs every 4 (four) hours as needed for wheezing or shortness of breath. 1 Inhaler 0  . amLODipine (NORVASC) 5 MG tablet Take 2 tablets (10 mg total) by mouth at bedtime. 30 tablet 3  . aspirin EC 81 MG tablet Take 81 mg by mouth daily.    . Calcium Carbonate-Vitamin D (CALCIUM 600+D PO) Take 1 tablet by mouth 2 (two) times daily.    . DULERA 100-5 MCG/ACT AERO Inhale 2 puffs into the lungs in the morning and at bedtime.     . FeFum-FePoly-FA-B Cmp-C-Biot (INTEGRA PLUS) CAPS Take 1 capsule by mouth 2 (two) times daily.    Marland Kitchen levothyroxine (SYNTHROID, LEVOTHROID) 150 MCG tablet Take 150 mcg by mouth daily before breakfast.     . loratadine (CLARITIN) 10 MG tablet Take 10 mg by mouth daily.    . Nebivolol HCl 20 MG TABS Take 1 tablet by mouth once daily (Patient taking differently: Take 20 mg by mouth daily.) 30 tablet 0  . olopatadine (PATANOL) 0.1 % ophthalmic solution Place 1 drop into both eyes 2 (two) times daily.     . rosuvastatin (CRESTOR) 10 MG tablet Take 10 mg by mouth at bedtime.    . senna-docusate (SENOKOT-S) 8.6-50 MG tablet Take 1 tablet by mouth 2 (two) times daily as needed for moderate constipation.    . torsemide (DEMADEX) 20 MG tablet Take 1 tablet (20 mg total) by mouth daily. Take extra one tab for leg swelling (Patient taking differently: Take 20 mg by mouth 2 (two) times daily. Take extra one tab for leg swelling) 90 tablet 2   No current facility-administered medications on file prior to visit.  Radiology:    CT angiography chest with contrast 02/20/2020: 1. No evidence for acute pulmonary embolus. 2. Patchy ground-glass and consolidative opacities throughout the aerated lungs bilaterally which may represent edema or infection. Additionally, there are scattered areas of consolidation within the  lungs bilaterally which may represent atelectasis or infection. Consider follow-up chest CT in 6-8 weeks after resolution of acute symptomatology to ensure the nodularity resolves. 3. Moderate bilateral pleural effusions. 4. Prominent mediastinal lymph nodes, likely reactive. 5. Dilated main pulmonary artery as can be seen with pulmonary arterial hypertension. 6. Aortic atherosclerosis.  Cardiac Studies:   Lexiscan Sestamibi Stress Test 01/29/2014: There is a very small and very mild fixed defect in the mid anterior wall consistent with breast attenuation.. No reversible  perfusion defects are identified.  Echocardiogram 02/21/2020: 1. Left ventricular ejection fraction, by estimation, is 60 to 65%. The left ventricle has normal function. The left ventricle has no regional wall motion abnormalities. There is mild concentric left ventricular hypertrophy.  Left ventricular diastolic parameters are consistent with Grade II diastolic dysfunction (pseudonormalization).  Elevated left ventricular end-diastolic pressure.  2. Right ventricular systolic function is normal. The right ventricular size is normal. There is mildly elevated pulmonary artery systolic pressure.  3. Left atrial size was moderately dilated.  4. The mitral valve is normal in structure. Trivial mitral valve regurgitation. No evidence of mitral stenosis.  5. The aortic valve was not well visualized. Aortic valve regurgitation is not visualized. No aortic stenosis is present.  6. The inferior vena cava is dilated in size with <50% respiratory variability, suggesting right atrial pressure of 15 mmHg.   Lexiscan Sestamibi stress test 03/10/2020: No previous exam available for comparison. Lexiscan nuclear stress test performed using 1-day protocol. Stress EKG is non-diagnostic, as this is pharmacological stress test. Normal myocardial perfusion. Stress LVEF 59%. Low risk study.    EKG   EKG sinus rhythm with first-degree AV block  at a rate of 65 bpm.  Normal axis.  Poor R wave progression, cannot exclude anteroseptal infarct old.  Incomplete right bundle branch block.  Low voltage complexes, underlying pulmonary disease pattern.  Compared to EKG 07/23/2020, no significant change.  Assessment     ICD-10-CM   1. Acute on chronic diastolic heart failure (HCC)  I50.33 EKG [id number]    Basic metabolic panel    Pro b natriuretic peptide ([facility]NP)[facility]  2. Essential hypertension, benign  I10 EKG 63-ZCHY    Basic metabolic panel  3. Stage 3b chronic kidney disease (HCC)  N18.32   4. Dyspnea on exertion  R06.00   5. Bilateral leg edema  R60.0      No orders of the defined types were placed in this encounter.   There are no discontinued medications.  Recommendations:   Bridget Lamb  is a 65 y.o. female with morbid obesity BMI 44, hypertension, restrictive airway disease due to bronchial asthma, hyperlipidemia, HFpEF, OSA and not using CPAP due to feeling claustrophobic.  Patient was admitted to ICU at the Hospital on 02/26/2020 for acute respiratory failure due to acute pulmonary edema in the setting of uncontrolled hypertension and diastolic CHF exacerbation she has relatively recuperated well.   Patient presents for 8-week follow-up of hypertension.  Blood pressure is now well controlled, will continue amlodipine, nebivolol.  Could consider addition of lisinopril 10 mg daily if additional antihypertensive medication is needed.  Patient is complaining of increased leg swelling, shortness of breath, orthopnea and she appears volume overloaded on exam.  Therefore will increase torsemide to 40 mg twice daily for 3 days.  We will repeat BMP and BNP in 5 days.  Will monitor renal function closely in view of patient's stage IIIb chronic kidney disease.  After 3 days of torsemide 40 mg twice daily patient will reduce to taking 20 mg twice daily.  Counseled patient to notify our office if she experiences  worsening symptoms, she verbalized understanding agreement.  Recommend close follow-up in the office as patient is at high risk for hospitalization with acute on chronic diastolic heart failure. Again discussed with patient regarding importance of Dash diet compliance.  Follow up in 1 week, sooner if needed, for acute on chronic heart failure and hypertension.   Alethia Berthold, PA-C 12/10/2020, 9:57 AM Office: [phone]

## 2020-12-10 ENCOUNTER — Ambulatory Visit (INDEPENDENT_AMBULATORY_CARE_PROVIDER_SITE_OTHER): Payer: BC Managed Care – PPO | Admitting: Student

## 2020-12-10 ENCOUNTER — Encounter: Payer: Self-pay | Admitting: Student

## 2020-12-10 ENCOUNTER — Other Ambulatory Visit: Payer: Self-pay

## 2020-12-10 VITALS — BP 124/62 | HR 64 | Temp 97.9°F | Ht 64.0 in | Wt 248.0 lb

## 2020-12-10 DIAGNOSIS — R600 Localized edema: Secondary | ICD-10-CM

## 2020-12-10 DIAGNOSIS — R0609 Other forms of dyspnea: Secondary | ICD-10-CM

## 2020-12-10 DIAGNOSIS — I5033 Acute on chronic diastolic (congestive) heart failure: Secondary | ICD-10-CM

## 2020-12-10 DIAGNOSIS — R0600 Dyspnea, unspecified: Secondary | ICD-10-CM

## 2020-12-10 DIAGNOSIS — N1832 Chronic kidney disease, stage 3b: Secondary | ICD-10-CM

## 2020-12-10 DIAGNOSIS — I10 Essential (primary) hypertension: Secondary | ICD-10-CM

## 2020-12-11 ENCOUNTER — Other Ambulatory Visit: Payer: Self-pay | Admitting: Cardiology

## 2020-12-11 DIAGNOSIS — I5032 Chronic diastolic (congestive) heart failure: Secondary | ICD-10-CM

## 2020-12-11 DIAGNOSIS — I10 Essential (primary) hypertension: Secondary | ICD-10-CM

## 2020-12-17 NOTE — Progress Notes (Signed)
Primary Physician/Referring:  [name], PA-C  Patient ID: Bridget Lamb, female    DOB: [birth year], 65 y.o.   MRN: [id number]  Chief Complaint  Patient presents with  . Acute on chronic diastolic heart failure  . Hypertension   HPI:    Bridget Lamb  is a 65 y.o. female with morbid obesity BMI 44, hypertension, restrictive airway disease due to bronchial asthma, hyperlipidemia, HFpEF, OSA and not using CPAP due to feeling claustrophobic.  Patient was admitted to ICU at the Hospital on 02/26/2020 for acute respiratory failure due to acute pulmonary edema in the setting of uncontrolled hypertension and diastolic CHF exacerbation, she has relatively recuperated well.   Patient presents for 1 week follow-up of acute on chronic diastolic heart failure.  At last visit increase torsemide to 40 mg twice daily for 3 days with repeat BMP and BNP, recommend patient to return to taking 20 mg twice daily after 3 days unless symptoms were continuing.  Patient reports she does continue to have dyspnea on exertion and orthopnea although bilateral lower leg edema has significantly improved since last visit.  She is currently taking torsemide 20 mg twice daily.  Brings with her written log of recent blood pressure readings, which are under excellent control.  Patient's BNP has improved, but is still elevated.  Her renal function electrolytes have remained stable.  Denies chest pain, dizziness, syncope, near syncope, PND.  Past Medical History:  Diagnosis Date  . Allergic rhinitis   . Anemia    hematology, Dr. Julien Nordmann, last 01/2011  . Asthma   . Cervix cancer ([place])   . CHF (congestive heart failure) ([place]) 2004  . Chronic kidney disease    protein in urine  . Complication of anesthesia   . Dyslipidemia   . Dyspnea    on excertion  . Endometrial cancer ([place])   . H/O echocardiogram 01/07/04   mild LVH, nl LV systolic function, EF 87%, left atrial enlargement, mildly thickened aortic and  mitral valves  . History of thyroid cancer   . Hypertension   . Incarcerated ventral hernia 10/07/2013  . Obesity, Class III, BMI 40-49.9 (morbid obesity) ([place]) 10/09/2013  . Osteopenia   . PONV (postoperative nausea and vomiting)   . Sleep apnea   . Umbilical hernia   . Unspecified essential hypertension 10/09/2013  . Unspecified hypothyroidism 10/09/2013   Past Surgical History:  Procedure Laterality Date  . BONE MARROW BIOPSY    . CARDIAC CATHETERIZATION  2005  . CHOLECYSTECTOMY  2003  . COLONOSCOPY  2008  . ROBOTIC ASSISTED TOTAL HYSTERECTOMY WITH BILATERAL SALPINGO OOPHERECTOMY N/A 10/25/2019   Procedure: XI ROBOTIC ASSISTED TOTAL HYSTERECTOMY WITH BILATERAL SALPINGO OOPHORECTOMY;  Surgeon: Lafonda Mosses, MD;  Location: [facility];  Service: Gynecology;  Laterality: N/A;  . SENTINEL NODE BIOPSY N/A 10/25/2019   Procedure: SENTINEL LYMPH NODE BIOPSY;  Surgeon: Lafonda Mosses, MD;  Location: [facility];  Service: Gynecology;  Laterality: N/A;  . THYROID SURGERY     partial thyroidectomy  . TOTAL THYROIDECTOMY  2009  . VENTRAL HERNIA REPAIR N/A 10/07/2013   Procedure: HERNIA REPAIR VENTRAL ADULT;  Surgeon: Gwenyth Ober, MD;  Location: [facility];  Service: General;  Laterality: N/A;   Family History  Problem Relation Age of Onset  . Hypertension Mother   . Pancreatic cancer Mother   . Breast cancer Maternal Aunt   . Lung cancer Father   . Ovarian cancer Neg Hx   . Endometrial cancer  Neg Hx   . Colon cancer Neg Hx     Social History   Tobacco Use  . Smoking status: Never Smoker  . Smokeless tobacco: Never Used  Substance Use Topics  . Alcohol use: No   Marital Status: Divorced  ROS  Review of Systems  Constitutional: Negative for malaise/fatigue and weight gain.  Cardiovascular: Positive for dyspnea on exertion (chronic, stable), leg swelling (minimal) and orthopnea. Negative for chest pain, claudication, near-syncope, palpitations, paroxysmal nocturnal dyspnea and  syncope.  Respiratory: Positive for shortness of breath and snoring (has sleep apnea unable to wear CPAP).   Hematologic/Lymphatic: Does not bruise/bleed easily.  Musculoskeletal: Positive for joint pain (knee and hips chronic).  Gastrointestinal: Negative for melena.  Neurological: Negative for dizziness and weakness.   Objective  Blood pressure (!) 142/56, pulse (!) 56, height $RemoveBe'5\' 4"'edbigcEyJ$  (1.626 m), weight 245 lb (111.1 kg), last menstrual period 10/22/2011, SpO2 99 %.  Vitals with BMI 12/18/2020 12/10/2020 10/15/2020  Height $Remov'5\' 4"'NRVqou$  $Remove'5\' 4"'ucsZFIH$  $RemoveB'5\' 4"'LisyfJnE$   Weight 245 lbs 248 lbs 249 lbs 6 oz  BMI 42.03 32.67 12.45  Systolic 809 983 382  Diastolic 56 62 66  Pulse 56 64 66     Physical Exam Vitals reviewed.  Constitutional:      General: She is not in acute distress.    Appearance: She is well-developed.     Comments: Morbidly obese  HENT:     Head: Normocephalic and atraumatic.  Cardiovascular:     Rate and Rhythm: Normal rate and regular rhythm.     Pulses: Intact distal pulses.     Heart sounds: S1 normal and S2 normal. Murmur heard.   Early systolic murmur is present with a grade of 1/6 at the apex.  Harsh mid to late systolic murmuris also present at the upper right sternal border. No gallop.      Comments: Wearing support stockings. No JVD  Pulmonary:     Effort: Pulmonary effort is normal. No accessory muscle usage or respiratory distress.     Breath sounds: Normal breath sounds. No wheezing, rhonchi or rales.  Abdominal:     General: Bowel sounds are normal.     Palpations: Abdomen is soft.     Comments: Pannus present  Musculoskeletal:        General: No tenderness.     Right lower leg: Edema (1+ pitting ) present.     Left lower leg: Edema (1+ pitting ) present.  Neurological:     Mental Status: She is alert.    Laboratory examination:   Recent Labs    05/01/20 1420 05/05/20 0809 05/20/20 0931  NA 142 139 140  K 5.2* 5.2* 5.2  CL 103 102 99  CO2 $Re'28 27 26  'yqw$ GLUCOSE 94  95 101*  BUN 38* 51* 31*  CREATININE 1.72* 1.93* 1.49*  CALCIUM 10.3 9.7 10.0  GFRNONAA 31* 27* 37*  GFRAA 36* 31* 42*   CrCl cannot be calculated (Patient's most recent lab result is older than the maximum 21 days allowed.).  CMP Latest Ref Rng & Units 05/20/2020 05/05/2020 05/01/2020  Glucose 65 - 99 mg/dL 101(H) 95 94  BUN 8 - 27 mg/dL 31(H) 51(H) 38(H)  Creatinine 0.57 - 1.00 mg/dL 1.49(H) 1.93(H) 1.72(H)  Sodium 134 - 144 mmol/L 140 139 142  Potassium 3.5 - 5.2 mmol/L 5.2 5.2(H) 5.2(H)  Chloride 96 - 106 mmol/L 99 102 103  CO2 20 - 29 mmol/L $RemoveB'26 27 28  'QPKtULqi$ Calcium 8.7 - 10.3 mg/dL  10.0 9.7 10.3  Total Protein 6.5 - 8.1 g/dL - - -  Total Bilirubin 0.3 - 1.2 mg/dL - - -  Alkaline Phos 38 - 126 U/L - - -  AST 15 - 41 U/L - - -  ALT 0 - 44 U/L - - -   CBC Latest Ref Rng & Units 05/05/2020 02/25/2020 02/23/2020  WBC 4.0 - 10.5 K/uL 9.2 7.2 7.4  Hemoglobin 12.0 - 15.0 g/dL 1.2(R) 8.9(L) 8.8(L)  Hematocrit 36.0 - 46.0 % 27.3(L) 30.4(L) 29.9(L)  Platelets 150 - 400 K/uL 190 175 173   TSH Recent Labs    02/20/20 2149  TSH 1.263   BNP (last 3 results) Recent Labs    02/20/20 1253 05/20/20 0931  BNP 562.6* 82.7   Magnesium:  Recent Labs    02/24/20 0409 02/25/20 0338 02/26/20 0246  MG 1.8 1.8 1.9    External labs:  12/09/2020: proBNP 1805 Total cholesterol 120, triglycerides 82, HDL 48, LDL 56, non-HDL 72 Glucose 100, BUN 25, creatinine 1.5, GFR 36, sodium 144, potassium 4.6  08/28/2020: Creatinine 1.27, EGFR 44, BUN 19, sodium 146, potassium 4.3  07/17/2020: BUN 15, creatinine 1.28, EGFR 44 mL.  Potassium 3.8, sodium 142. TSH 7.220, mildly elevated.  04/14/2020:  Serum glucose 95 mg, BUN 35, creatinine 1.47, EGFR 37 mL.  02/22/20:  Vitamin B12 347. Iron 26 (L), TIBC 288, Saturation Ratios 9 (L), UIBC 262.    12/05/2019:  Total cholesterol 158, triglycerides 102, HDL 53, LDL 86.    Medications and allergies   Allergies  Allergen Reactions  . Procardia  [Nifedipine] Other (See Comments)    Ineffective     Current Outpatient Medications on File Prior to Visit  Medication Sig Dispense Refill  . albuterol (PROVENTIL HFA;VENTOLIN HFA) 108 (90 Base) MCG/ACT inhaler Inhale 2 puffs into the lungs every 4 (four) hours as needed for wheezing or shortness of breath. 1 Inhaler 0  . amLODipine (NORVASC) 5 MG tablet Take 2 tablets (10 mg total) by mouth at bedtime. 30 tablet 3  . aspirin EC 81 MG tablet Take 81 mg by mouth daily.    . Calcium Carbonate-Vitamin D (CALCIUM 600+D PO) Take 1 tablet by mouth 2 (two) times daily.    . DULERA 100-5 MCG/ACT AERO Inhale 2 puffs into the lungs in the morning and at bedtime.     . FeFum-FePoly-FA-B Cmp-C-Biot (INTEGRA PLUS) CAPS Take 1 capsule by mouth 2 (two) times daily.    Marland Kitchen levothyroxine (SYNTHROID, LEVOTHROID) 150 MCG tablet Take 150 mcg by mouth daily before breakfast.     . loratadine (CLARITIN) 10 MG tablet Take 10 mg by mouth daily.    . Nebivolol HCl 20 MG TABS Take 1 tablet by mouth once daily 30 tablet 0  . olopatadine (PATANOL) 0.1 % ophthalmic solution Place 1 drop into both eyes 2 (two) times daily.     . rosuvastatin (CRESTOR) 10 MG tablet Take 10 mg by mouth at bedtime.    . senna-docusate (SENOKOT-S) 8.6-50 MG tablet Take 1 tablet by mouth 2 (two) times daily as needed for moderate constipation.    . torsemide (DEMADEX) 20 MG tablet Take 1 tablet (20 mg total) by mouth daily. Take extra one tab for leg swelling (Patient taking differently: Take 20 mg by mouth 2 (two) times daily. Take extra one tab for leg swelling) 90 tablet 2   No current facility-administered medications on file prior to visit.    Radiology:    CT angiography chest  with contrast 02/20/2020: 1. No evidence for acute pulmonary embolus. 2. Patchy ground-glass and consolidative opacities throughout the aerated lungs bilaterally which may represent edema or infection. Additionally, there are scattered areas of consolidation within  the lungs bilaterally which may represent atelectasis or infection. Consider follow-up chest CT in 6-8 weeks after resolution of acute symptomatology to ensure the nodularity resolves. 3. Moderate bilateral pleural effusions. 4. Prominent mediastinal lymph nodes, likely reactive. 5. Dilated main pulmonary artery as can be seen with pulmonary arterial hypertension. 6. Aortic atherosclerosis.  Cardiac Studies:   Lexiscan Sestamibi Stress Test 01/29/2014: There is a very small and very mild fixed defect in the mid anterior wall consistent with breast attenuation.. No reversible  perfusion defects are identified.  Echocardiogram 02/21/2020: 1. Left ventricular ejection fraction, by estimation, is 60 to 65%. The left ventricle has normal function. The left ventricle has no regional wall motion abnormalities. There is mild concentric left ventricular hypertrophy.  Left ventricular diastolic parameters are consistent with Grade II diastolic dysfunction (pseudonormalization).  Elevated left ventricular end-diastolic pressure.  2. Right ventricular systolic function is normal. The right ventricular size is normal. There is mildly elevated pulmonary artery systolic pressure.  3. Left atrial size was moderately dilated.  4. The mitral valve is normal in structure. Trivial mitral valve regurgitation. No evidence of mitral stenosis.  5. The aortic valve was not well visualized. Aortic valve regurgitation is not visualized. No aortic stenosis is present.  6. The inferior vena cava is dilated in size with <50% respiratory variability, suggesting right atrial pressure of 15 mmHg.   Lexiscan Sestamibi stress test 03/10/2020: No previous exam available for comparison. Lexiscan nuclear stress test performed using 1-day protocol. Stress EKG is non-diagnostic, as this is pharmacological stress test. Normal myocardial perfusion. Stress LVEF 59%. Low risk study.    EKG   EKG sinus rhythm with first-degree AV  block at a rate of 65 bpm.  Normal axis.  Poor R wave progression, cannot exclude anteroseptal infarct old.  Incomplete right bundle branch block.  Low voltage complexes, underlying pulmonary disease pattern.  Compared to EKG 07/23/2020, no significant change.  Assessment     ICD-10-CM   1. Acute on chronic diastolic heart failure (HCC)  I50.33   2. Essential hypertension, benign  I10   3. Stage 3b chronic kidney disease (HCC)  N18.32      No orders of the defined types were placed in this encounter.   There are no discontinued medications.  Recommendations:   Bridget Lamb  is a 65 y.o. female with morbid obesity BMI 44, hypertension, restrictive airway disease due to bronchial asthma, hyperlipidemia, HFpEF, OSA and not using CPAP due to feeling claustrophobic.  Patient was admitted to ICU at the Hospital on 02/26/2020 for acute respiratory failure due to acute pulmonary edema in the setting of uncontrolled hypertension and diastolic CHF exacerbation she has relatively recuperated well.   Patient presents for 1 week follow-up of acute on chronic diastolic heart failure and hypertension.  Patient's blood pressure is under excellent control, will not make changes to her antihypertensive medications at this time.  However she continues to remain volume overloaded on exam and complains of continued shortness of breath and orthopnea.  Advised patient to increase torsemide to 40 mg twice daily for 1 week.  We will repeat BMP.  Again discussed with patient regarding importance of diet compliance, as she admits to high sodium intake.  Follow-up in 2 weeks, sooner if needed, for acute on  chronic heart failure.   Alethia Berthold, PA-C 12/18/2020, 5:05 PM Office: [phone]

## 2020-12-18 ENCOUNTER — Other Ambulatory Visit: Payer: Self-pay

## 2020-12-18 ENCOUNTER — Ambulatory Visit: Payer: BC Managed Care – PPO | Admitting: Student

## 2020-12-18 ENCOUNTER — Encounter: Payer: Self-pay | Admitting: Student

## 2020-12-18 VITALS — BP 142/56 | HR 56 | Ht 64.0 in | Wt 245.0 lb

## 2020-12-18 DIAGNOSIS — I10 Essential (primary) hypertension: Secondary | ICD-10-CM

## 2020-12-18 DIAGNOSIS — I5033 Acute on chronic diastolic (congestive) heart failure: Secondary | ICD-10-CM

## 2020-12-18 DIAGNOSIS — N1832 Chronic kidney disease, stage 3b: Secondary | ICD-10-CM

## 2020-12-30 ENCOUNTER — Other Ambulatory Visit: Payer: Self-pay | Admitting: Cardiology

## 2020-12-30 ENCOUNTER — Other Ambulatory Visit: Payer: Self-pay | Admitting: Student

## 2020-12-30 DIAGNOSIS — I5032 Chronic diastolic (congestive) heart failure: Secondary | ICD-10-CM

## 2020-12-30 DIAGNOSIS — R600 Localized edema: Secondary | ICD-10-CM

## 2020-12-30 NOTE — Telephone Encounter (Signed)
I sent you this by accident this morning, I could have done it. Sorry!

## 2020-12-30 NOTE — Progress Notes (Signed)
Primary Physician/Referring:  [name], PA-C  Patient ID: Bridget Lamb, female    DOB: 08/03/1956, 65 y.o.   MRN: [id number]  Chief Complaint  Patient presents with  . Acute on chronic diastolic heart failure  . Follow-up    2 weeks   HPI:    Bridget Lamb  is a 65 y.o. female with morbid obesity BMI 44, hypertension, restrictive airway disease due to bronchial asthma, hyperlipidemia, HFpEF, OSA now compliant with CPAP.  Patient was admitted to ICU at the Hospital on 02/26/2020 for acute respiratory failure due to acute pulmonary edema in the setting of uncontrolled hypertension and diastolic CHF exacerbation, she has relatively recuperated well.   Patient presents for 2-week follow-up of acute on chronic heart failure.  At last visit advised patient to increase torsemide to 40 mg twice daily for 1 week.  Patient has not returned to taking torsemide 20 mg twice daily.  She reports symptoms have essentially resolved, significant improvement of shortness of breath, orthopnea, and leg swelling.  She does continue to have mild bilateral lower leg edema as well as dyspnea on exertion, which is now baseline.  Patient remains compliant with CPAP.  She also states allergies have been bothering her over the last several days, which seems to exacerbate dyspnea on exertion.  Patient brings with her written log of home blood pressure readings which are under excellent control. Denies chest pain, dizziness, syncope, near syncope, PND.  Past Medical History:  Diagnosis Date  . Allergic rhinitis   . Anemia    hematology, Dr. Julien Nordmann, last 01/2011  . Asthma   . Cervix cancer ([place])   . CHF (congestive heart failure) ([place]) 2004  . Chronic kidney disease    protein in urine  . Complication of anesthesia   . Dyslipidemia   . Dyspnea    on excertion  . Endometrial cancer ([place])   . H/O echocardiogram 01/07/04   mild LVH, nl LV systolic function, EF 22%, left atrial enlargement, mildly  thickened aortic and mitral valves  . History of thyroid cancer   . Hypertension   . Incarcerated ventral hernia 10/07/2013  . Obesity, Class III, BMI 40-49.9 (morbid obesity) ([place]) 10/09/2013  . Osteopenia   . PONV (postoperative nausea and vomiting)   . Sleep apnea   . Umbilical hernia   . Unspecified essential hypertension 10/09/2013  . Unspecified hypothyroidism 10/09/2013   Past Surgical History:  Procedure Laterality Date  . BONE MARROW BIOPSY    . CARDIAC CATHETERIZATION  2005  . CHOLECYSTECTOMY  2003  . COLONOSCOPY  2008  . ROBOTIC ASSISTED TOTAL HYSTERECTOMY WITH BILATERAL SALPINGO OOPHERECTOMY N/A 10/25/2019   Procedure: XI ROBOTIC ASSISTED TOTAL HYSTERECTOMY WITH BILATERAL SALPINGO OOPHORECTOMY;  Surgeon: Lafonda Mosses, MD;  Location: [facility];  Service: Gynecology;  Laterality: N/A;  . SENTINEL NODE BIOPSY N/A 10/25/2019   Procedure: SENTINEL LYMPH NODE BIOPSY;  Surgeon: Lafonda Mosses, MD;  Location: [facility];  Service: Gynecology;  Laterality: N/A;  . THYROID SURGERY     partial thyroidectomy  . TOTAL THYROIDECTOMY  2009  . VENTRAL HERNIA REPAIR N/A 10/07/2013   Procedure: HERNIA REPAIR VENTRAL ADULT;  Surgeon: Gwenyth Ober, MD;  Location: [facility];  Service: General;  Laterality: N/A;   Family History  Problem Relation Age of Onset  . Hypertension Mother   . Pancreatic cancer Mother   . Breast cancer Maternal Aunt   . Lung cancer Father   . Ovarian cancer Neg  Hx   . Endometrial cancer Neg Hx   . Colon cancer Neg Hx     Social History   Tobacco Use  . Smoking status: Never Smoker  . Smokeless tobacco: Never Used  Substance Use Topics  . Alcohol use: No   Marital Status: Divorced  ROS  Review of Systems  Constitutional: Negative for malaise/fatigue and weight gain.  Cardiovascular: Positive for dyspnea on exertion (chronic, stable) and leg swelling (minimal). Negative for chest pain, claudication, near-syncope, orthopnea (resolved), palpitations,  paroxysmal nocturnal dyspnea and syncope.  Respiratory: Positive for snoring (has sleep apnea unable to wear CPAP). Negative for shortness of breath (resolved).   Hematologic/Lymphatic: Does not bruise/bleed easily.  Musculoskeletal: Positive for joint pain (knee and hips chronic).  Gastrointestinal: Negative for melena.  Neurological: Negative for dizziness and weakness.   Objective  Blood pressure (!) 142/67, pulse 69, temperature 98.1 F (36.7 C), resp. rate 16, height $RemoveBe'5\' 4"'SInwXCGlk$  (1.626 m), weight 245 lb (111.1 kg), last menstrual period 10/22/2011, SpO2 97 %.  Vitals with BMI 12/31/2020 12/18/2020 12/10/2020  Height $Remov'5\' 4"'QYFJKG$  $Remove'5\' 4"'zTIZugU$  $RemoveB'5\' 4"'MmEjpXbT$   Weight 245 lbs 245 lbs 248 lbs  BMI 42.03 10.25 85.27  Systolic 782 423 536  Diastolic 67 56 62  Pulse 69 56 64     Physical Exam Vitals reviewed.  Constitutional:      General: She is not in acute distress.    Appearance: She is well-developed.     Comments: Morbidly obese  HENT:     Head: Normocephalic and atraumatic.  Cardiovascular:     Rate and Rhythm: Normal rate and regular rhythm.     Pulses: Intact distal pulses.     Heart sounds: S1 normal and S2 normal. Murmur heard.   Early systolic murmur is present with a grade of 1/6 at the apex.  Harsh mid to late systolic murmuris also present at the upper right sternal border. No gallop.      Comments: Wearing support stockings. No JVD  Pulmonary:     Effort: Pulmonary effort is normal. No accessory muscle usage or respiratory distress.     Breath sounds: Normal breath sounds. No wheezing, rhonchi or rales.  Abdominal:     General: Bowel sounds are normal.     Palpations: Abdomen is soft.     Comments: Pannus present  Musculoskeletal:        General: No tenderness.     Right lower leg: Edema (trace) present.     Left lower leg: Edema (trace) present.  Skin:    General: Skin is warm and dry.  Neurological:     Mental Status: She is alert.    Laboratory examination:   Recent Labs     05/01/20 1420 05/05/20 0809 05/20/20 0931  NA 142 139 140  K 5.2* 5.2* 5.2  CL 103 102 99  CO2 $Re'28 27 26  'jpN$ GLUCOSE 94 95 101*  BUN 38* 51* 31*  CREATININE 1.72* 1.93* 1.49*  CALCIUM 10.3 9.7 10.0  GFRNONAA 31* 27* 37*  GFRAA 36* 31* 42*   CrCl cannot be calculated (Patient's most recent lab result is older than the maximum 21 days allowed.).  CMP Latest Ref Rng & Units 05/20/2020 05/05/2020 05/01/2020  Glucose 65 - 99 mg/dL 101(H) 95 94  BUN 8 - 27 mg/dL 31(H) 51(H) 38(H)  Creatinine 0.57 - 1.00 mg/dL 1.49(H) 1.93(H) 1.72(H)  Sodium 134 - 144 mmol/L 140 139 142  Potassium 3.5 - 5.2 mmol/L 5.2 5.2(H) 5.2(H)  Chloride 96 -  106 mmol/L 99 102 103  CO2 20 - 29 mmol/L $RemoveB'26 27 28  'podnyZpl$ Calcium 8.7 - 10.3 mg/dL 10.0 9.7 10.3  Total Protein 6.5 - 8.1 g/dL - - -  Total Bilirubin 0.3 - 1.2 mg/dL - - -  Alkaline Phos 38 - 126 U/L - - -  AST 15 - 41 U/L - - -  ALT 0 - 44 U/L - - -   CBC Latest Ref Rng & Units 05/05/2020 02/25/2020 02/23/2020  WBC 4.0 - 10.5 K/uL 9.2 7.2 7.4  Hemoglobin 12.0 - 15.0 g/dL 8.8(L) 8.9(L) 8.8(L)  Hematocrit 36.0 - 46.0 % 27.3(L) 30.4(L) 29.9(L)  Platelets 150 - 400 K/uL 190 175 173   TSH Recent Labs    02/20/20 2149  TSH 1.263   BNP (last 3 results) Recent Labs    02/20/20 1253 05/20/20 0931  BNP 562.6* 82.7   Magnesium:  Recent Labs    02/24/20 0409 02/25/20 0338 02/26/20 0246  MG 1.8 1.8 1.9    External labs:  12/15/2020: NT proBNP 820 Glucose 96, BUN 37, creatinine 1.52, GFR 36, sodium 146, potassium 4.4  12/09/2020: proBNP 1805 Total cholesterol 120, triglycerides 82, HDL 48, LDL 56, non-HDL 72 Glucose 100, BUN 25, creatinine 1.5, GFR 36, sodium 144, potassium 4.6  08/28/2020: Creatinine 1.27, EGFR 44, BUN 19, sodium 146, potassium 4.3  07/17/2020: BUN 15, creatinine 1.28, EGFR 44 mL.  Potassium 3.8, sodium 142. TSH 7.220, mildly elevated.  04/14/2020:  Serum glucose 95 mg, BUN 35, creatinine 1.47, EGFR 37 mL.  02/22/20:  Vitamin B12  347. Iron 26 (L), TIBC 288, Saturation Ratios 9 (L), UIBC 262.    12/05/2019:  Total cholesterol 158, triglycerides 102, HDL 53, LDL 86.    Medications and allergies   Allergies  Allergen Reactions  . Procardia [Nifedipine] Other (See Comments)    Ineffective     Current Outpatient Medications on File Prior to Visit  Medication Sig Dispense Refill  . albuterol (PROVENTIL HFA;VENTOLIN HFA) 108 (90 Base) MCG/ACT inhaler Inhale 2 puffs into the lungs every 4 (four) hours as needed for wheezing or shortness of breath. 1 Inhaler 0  . amLODipine (NORVASC) 5 MG tablet Take 2 tablets by mouth once daily 30 tablet 0  . aspirin EC 81 MG tablet Take 81 mg by mouth daily.    . Calcium Carbonate-Vitamin D (CALCIUM 600+D PO) Take 1 tablet by mouth 2 (two) times daily.    . DULERA 100-5 MCG/ACT AERO Inhale 2 puffs into the lungs in the morning and at bedtime.     . FeFum-FePoly-FA-B Cmp-C-Biot (INTEGRA PLUS) CAPS Take 1 capsule by mouth 2 (two) times daily.    Marland Kitchen levothyroxine (SYNTHROID, LEVOTHROID) 150 MCG tablet Take 150 mcg by mouth daily before breakfast.     . loratadine (CLARITIN) 10 MG tablet Take 10 mg by mouth daily.    . Nebivolol HCl 20 MG TABS Take 1 tablet by mouth once daily 30 tablet 0  . olopatadine (PATANOL) 0.1 % ophthalmic solution Place 1 drop into both eyes 2 (two) times daily.     . rosuvastatin (CRESTOR) 10 MG tablet Take 10 mg by mouth at bedtime.    . senna-docusate (SENOKOT-S) 8.6-50 MG tablet Take 1 tablet by mouth 2 (two) times daily as needed for moderate constipation.     No current facility-administered medications on file prior to visit.    Radiology:    CT angiography chest with contrast 02/20/2020: 1. No evidence for acute pulmonary embolus. 2.  Patchy ground-glass and consolidative opacities throughout the aerated lungs bilaterally which may represent edema or infection. Additionally, there are scattered areas of consolidation within the lungs bilaterally which  may represent atelectasis or infection. Consider follow-up chest CT in 6-8 weeks after resolution of acute symptomatology to ensure the nodularity resolves. 3. Moderate bilateral pleural effusions. 4. Prominent mediastinal lymph nodes, likely reactive. 5. Dilated main pulmonary artery as can be seen with pulmonary arterial hypertension. 6. Aortic atherosclerosis.  Cardiac Studies:   Lexiscan Sestamibi Stress Test 01/29/2014: There is a very small and very mild fixed defect in the mid anterior wall consistent with breast attenuation.. No reversible  perfusion defects are identified.  Echocardiogram 02/21/2020: 1. Left ventricular ejection fraction, by estimation, is 60 to 65%. The left ventricle has normal function. The left ventricle has no regional wall motion abnormalities. There is mild concentric left ventricular hypertrophy.  Left ventricular diastolic parameters are consistent with Grade II diastolic dysfunction (pseudonormalization).  Elevated left ventricular end-diastolic pressure.  2. Right ventricular systolic function is normal. The right ventricular size is normal. There is mildly elevated pulmonary artery systolic pressure.  3. Left atrial size was moderately dilated.  4. The mitral valve is normal in structure. Trivial mitral valve regurgitation. No evidence of mitral stenosis.  5. The aortic valve was not well visualized. Aortic valve regurgitation is not visualized. No aortic stenosis is present.  6. The inferior vena cava is dilated in size with <50% respiratory variability, suggesting right atrial pressure of 15 mmHg.   Lexiscan Sestamibi stress test 03/10/2020: No previous exam available for comparison. Lexiscan nuclear stress test performed using 1-day protocol. Stress EKG is non-diagnostic, as this is pharmacological stress test. Normal myocardial perfusion. Stress LVEF 59%. Low risk study.    EKG   EKG 12/18/2020: sinus rhythm with first-degree AV block at a rate of  65 bpm.  Normal axis.  Poor R wave progression, cannot exclude anteroseptal infarct old.  Incomplete right bundle branch block.  Low voltage complexes, underlying pulmonary disease pattern.  Compared to EKG 07/23/2020, no significant change.  Assessment     ICD-10-CM   1. Acute on chronic diastolic heart failure (HCC)  J00.93 Basic metabolic panel    Pro b natriuretic peptide ([facility]NP)[facility])  2. Essential hypertension, benign  G18 Basic metabolic panel  3. Stage 3b chronic kidney disease (HCC)  N18.32   4. Chronic diastolic CHF (congestive heart failure) (HCC)  I50.32 torsemide (DEMADEX) 20 MG tablet  5. Bilateral leg edema  R60.0 torsemide (DEMADEX) 20 MG tablet     Meds ordered this encounter  Medications  . torsemide (DEMADEX) 20 MG tablet    Sig: Take 1 tablet (20 mg total) by mouth 2 (two) times daily.    Dispense:  90 tablet    Refill:  3    Medications Discontinued During This Encounter  Medication Reason  . torsemide (DEMADEX) 20 MG tablet      Recommendations:   Tishanna Dunford Pair  is a 65 y.o. female with morbid obesity BMI 44, hypertension, restrictive airway disease due to bronchial asthma, hyperlipidemia, HFpEF, OSA and using CPAP due to feeling claustrophobic.  Patient was admitted to ICU at the Hospital on 02/26/2020 for acute respiratory failure due to acute pulmonary edema in the setting of uncontrolled hypertension and diastolic CHF exacerbation she has relatively recuperated well.   Patient presents for 2-week follow-up of acute on chronic diastolic heart failure.  There are no clinical signs of acute decompensated heart  failure at this time.  BNP has trended down and patient symptoms have essentially resolved.  Patient to continue torsemide 20 mg twice daily, with additional doses as needed, as her renal function electrolytes have remained stable.  In regard to hypertension, blood pressure is elevated in the office today, however patient brings  with her written log of home blood pressure readings which are under excellent control.  Will not make changes to her antihypertensive medications at this time.  Patient remains at high risk for heart failure exacerbation and hospitalization.  She continues to struggle with diet compliance and weight loss.  Again discussed at length regarding the importance of Dash diet, weight loss, and compliance with CPAP and medications.  Patient verbalized understanding and agreement, admits that she continues to struggle with high sodium intake.  Follow-up in 3 months, sooner if needed, for hypertension, heart failure, hyperlipidemia.   Alethia Berthold, PA-C 12/31/2020, 1:44 PM Office: [phone]

## 2020-12-30 NOTE — Telephone Encounter (Signed)
Yes, but I dont have an approve button. Do I need to do anything additional?

## 2020-12-30 NOTE — Telephone Encounter (Signed)
Okay to refill? 

## 2020-12-31 ENCOUNTER — Encounter: Payer: Self-pay | Admitting: Student

## 2020-12-31 ENCOUNTER — Other Ambulatory Visit: Payer: Self-pay

## 2020-12-31 ENCOUNTER — Ambulatory Visit: Payer: BC Managed Care – PPO | Admitting: Student

## 2020-12-31 VITALS — BP 142/67 | HR 69 | Temp 98.1°F | Resp 16 | Ht 64.0 in | Wt 245.0 lb

## 2020-12-31 DIAGNOSIS — I5033 Acute on chronic diastolic (congestive) heart failure: Secondary | ICD-10-CM

## 2020-12-31 DIAGNOSIS — I10 Essential (primary) hypertension: Secondary | ICD-10-CM

## 2020-12-31 DIAGNOSIS — N1832 Chronic kidney disease, stage 3b: Secondary | ICD-10-CM

## 2020-12-31 DIAGNOSIS — I5032 Chronic diastolic (congestive) heart failure: Secondary | ICD-10-CM

## 2020-12-31 DIAGNOSIS — R600 Localized edema: Secondary | ICD-10-CM

## 2020-12-31 MED ORDER — TORSEMIDE 20 MG PO TABS: 20.0000 mg | ORAL_TABLET | Freq: Two times a day (BID) | ORAL | 3 refills | Status: DC

## 2021-01-16 ENCOUNTER — Other Ambulatory Visit: Payer: Self-pay | Admitting: Student

## 2021-01-16 DIAGNOSIS — I5032 Chronic diastolic (congestive) heart failure: Secondary | ICD-10-CM

## 2021-01-16 DIAGNOSIS — I10 Essential (primary) hypertension: Secondary | ICD-10-CM

## 2021-01-19 ENCOUNTER — Ambulatory Visit
Admission: RE | Admit: 2021-01-19 | Discharge: 2021-01-19 | Disposition: A | Discharge status: Home / Self Care | Payer: BC Managed Care – PPO | Source: Ambulatory Visit | Attending: Radiation Oncology | Admitting: Radiation Oncology

## 2021-01-19 ENCOUNTER — Other Ambulatory Visit: Payer: Self-pay

## 2021-01-19 ENCOUNTER — Encounter: Payer: Self-pay | Admitting: Radiation Oncology

## 2021-01-19 DIAGNOSIS — Z7982 Long term (current) use of aspirin: Secondary | ICD-10-CM | POA: Diagnosis not present

## 2021-01-19 DIAGNOSIS — M7989 Other specified soft tissue disorders: Secondary | ICD-10-CM | POA: Insufficient documentation

## 2021-01-19 DIAGNOSIS — Z79899 Other long term (current) drug therapy: Secondary | ICD-10-CM | POA: Insufficient documentation

## 2021-01-19 DIAGNOSIS — C541 Malignant neoplasm of endometrium: Secondary | ICD-10-CM

## 2021-01-19 DIAGNOSIS — Z8542 Personal history of malignant neoplasm of other parts of uterus: Secondary | ICD-10-CM | POA: Insufficient documentation

## 2021-01-19 NOTE — Progress Notes (Signed)
Bridget Lamb is here today for follow up post radiation to the pelvic.  They completed their radiation on: 07/24/20  Does the patient complain of any of the following:  . Pain denies pain . Abdominal bloating:no . Diarrhea/Constipation: no . Nausea/Vomiting: no . Vaginal Discharge: no . Blood in Urine or Stool: no . Urinary Issues (dysuria/incomplete emptying/ incontinence/ increased frequency/urgency): denies any complaints with urinary issue. . Does patient report using vaginal dilator 2-3 times a week and/or sexually active 2-3 weeks: patient report she has not used in the past few weeks. Marland Kitchen Post radiation skin changes: denies any skin issues.   Additional comments if applicable: Vitals:   93/90/30 1014  BP: (!) 138/57  Pulse: 61  Resp: 20  Temp: 97.7 F (36.5 C)  SpO2: 96%  Weight: 251 lb 12.8 oz (114.2 kg)  Height: 5\' 4"  (1.626 m)

## 2021-01-19 NOTE — Progress Notes (Signed)
Radiation Oncology         [phone] ________________________________  Name: Bridget Lamb MRN: [id number]  Date: 01/19/2021  DOB: 06-May-1956  Follow-Up Visit Note  CC: [name], PA-C  [name], PA-C    ICD-10-CM   1. Endometrial cancer (HCC)  C54.1     Diagnosis: Recurrent FIGO stage IA (cT1a, cN0, cM0) endometrial adenocarcinoma, grade 1  Interval Since Last Radiation: Six months  Radiation Treatment Dates: 05/28/2020 through 07/24/2020 Site Technique Total Dose (Gy) Dose per Fx (Gy) Completed Fx Beam Energies  Pelvis: Pelvis IMRT 45/45 1.8 25/25 6X  Vagina: Pelvis_Bst HDR-brachy 24/24 6 4/4 Ir-192    Narrative:  The patient returns today for routine follow-up. Since her last visit, she was seen by Dr. Berline Lopes on 10/08/2020 and had no evidence of disease on examination.  On review of systems, she reports no new medical issues since last follow-up. She denies pelvic pain vaginal bleeding or discharge.  She denies any hematuria or rectal bleeding.  She has been somewhat inconsistent with using her vaginal dilator and we reviewed the recommendations concerning this issue.  She continues to have some problems with swelling in her legs after standing for long hours.  She is using compression socks which help this issue.                    ALLERGIES:  is allergic to procardia [nifedipine].  Meds: Current Outpatient Medications  Medication Sig Dispense Refill  . albuterol (PROVENTIL HFA;VENTOLIN HFA) 108 (90 Base) MCG/ACT inhaler Inhale 2 puffs into the lungs every 4 (four) hours as needed for wheezing or shortness of breath. 1 Inhaler 0  . amLODipine (NORVASC) 5 MG tablet Take 2 tablets by mouth once daily 180 tablet 0  . aspirin EC 81 MG tablet Take 81 mg by mouth daily.    . Calcium Carbonate-Vitamin D (CALCIUM 600+D PO) Take 1 tablet by mouth 2 (two) times daily.    . DULERA 100-5 MCG/ACT AERO Inhale 2 puffs into the lungs in the morning and at bedtime.     .  FeFum-FePoly-FA-B Cmp-C-Biot (INTEGRA PLUS) CAPS Take 1 capsule by mouth 2 (two) times daily.    Marland Kitchen levothyroxine (SYNTHROID, LEVOTHROID) 150 MCG tablet Take 150 mcg by mouth daily before breakfast.     . loratadine (CLARITIN) 10 MG tablet Take 10 mg by mouth daily.    . Nebivolol HCl 20 MG TABS Take 1 tablet by mouth once daily 90 tablet 0  . olopatadine (PATANOL) 0.1 % ophthalmic solution Place 1 drop into both eyes 2 (two) times daily.     . rosuvastatin (CRESTOR) 10 MG tablet Take 10 mg by mouth at bedtime.    . senna-docusate (SENOKOT-S) 8.6-50 MG tablet Take 1 tablet by mouth 2 (two) times daily as needed for moderate constipation.    . torsemide (DEMADEX) 20 MG tablet Take 1 tablet (20 mg total) by mouth 2 (two) times daily. 90 tablet 3   No current facility-administered medications for this encounter.    Physical Findings: The patient is in no acute distress. Patient is alert and oriented.  height is 5\' 4"  (1.626 m) and weight is 251 lb 12.8 oz (114.2 kg). Her temperature is 97.7 F (36.5 C). Her blood pressure is 138/57 (abnormal) and her pulse is 61. Her respiration is 20 and oxygen saturation is 96%.   Lungs are clear to auscultation bilaterally. Heart has regular rate and rhythm. No palpable cervical, supraclavicular, or  axillary adenopathy. Abdomen soft, non-tender, normal bowel sounds. On pelvic examination the external genitalia were unremarkable. A speculum exam was performed. There are no mucosal lesions noted in the vaginal vault.  Some radiation changes noted at the vaginal cuff. on bimanual and rectovaginal examination there were no pelvic masses appreciated.  Rectal sphincter tone good.  Lab Findings: Lab Results  Component Value Date   WBC 9.2 05/05/2020   HGB 8.8 (L) 05/05/2020   HCT 27.3 (L) 05/05/2020   MCV 96.8 05/05/2020   PLT 190 05/05/2020    Radiographic Findings: No results found.  Impression: Recurrent FIGO stage IA (cT1a, cN0, cM0) endometrial  adenocarcinoma, grade 1  No evidence of recurrence on clinical exam today.  Clinically doing well with no new medical issues  Plan: The patient will follow up with Dr. Berline Lopes in three months and with radiation oncology in six months.  Total time spent in this encounter was 20 minutes which included reviewing the patient's most recent follow-up with Dr. Berline Lopes, physical examination, and documentation.  ____________________________________   Blair Promise, PhD, MD  This document serves as a record of services personally performed by Gery Pray, MD. It was created on his behalf by Clerance Lav, a trained medical scribe. The creation of this record is based on the scribe's personal observations and the provider's statements to them. This document has been checked and approved by the attending provider.

## 2021-02-27 ENCOUNTER — Inpatient Hospital Stay (HOSPITAL_COMMUNITY): Payer: BC Managed Care – PPO

## 2021-02-27 ENCOUNTER — Inpatient Hospital Stay (HOSPITAL_COMMUNITY)
Admission: EM | Admit: 2021-02-27 | Discharge: 2021-03-04 | DRG: 291 | Disposition: A | Discharge status: Home / Self Care | Payer: BC Managed Care – PPO | Attending: Family Medicine | Admitting: Family Medicine

## 2021-02-27 ENCOUNTER — Emergency Department (HOSPITAL_COMMUNITY): Payer: BC Managed Care – PPO

## 2021-02-27 ENCOUNTER — Encounter (HOSPITAL_COMMUNITY): Payer: Self-pay | Admitting: Emergency Medicine

## 2021-02-27 DIAGNOSIS — Z6841 Body Mass Index (BMI) 40.0 and over, adult: Secondary | ICD-10-CM | POA: Diagnosis not present

## 2021-02-27 DIAGNOSIS — Z79899 Other long term (current) drug therapy: Secondary | ICD-10-CM | POA: Diagnosis not present

## 2021-02-27 DIAGNOSIS — Z7951 Long term (current) use of inhaled steroids: Secondary | ICD-10-CM | POA: Diagnosis not present

## 2021-02-27 DIAGNOSIS — Z923 Personal history of irradiation: Secondary | ICD-10-CM

## 2021-02-27 DIAGNOSIS — R0902 Hypoxemia: Secondary | ICD-10-CM

## 2021-02-27 DIAGNOSIS — Z8542 Personal history of malignant neoplasm of other parts of uterus: Secondary | ICD-10-CM | POA: Diagnosis not present

## 2021-02-27 DIAGNOSIS — Z9111 Patient's noncompliance with dietary regimen: Secondary | ICD-10-CM

## 2021-02-27 DIAGNOSIS — D509 Iron deficiency anemia, unspecified: Secondary | ICD-10-CM | POA: Diagnosis present

## 2021-02-27 DIAGNOSIS — J45909 Unspecified asthma, uncomplicated: Secondary | ICD-10-CM | POA: Diagnosis present

## 2021-02-27 DIAGNOSIS — J9601 Acute respiratory failure with hypoxia: Secondary | ICD-10-CM | POA: Diagnosis present

## 2021-02-27 DIAGNOSIS — Z8249 Family history of ischemic heart disease and other diseases of the circulatory system: Secondary | ICD-10-CM | POA: Diagnosis not present

## 2021-02-27 DIAGNOSIS — Z7982 Long term (current) use of aspirin: Secondary | ICD-10-CM | POA: Diagnosis not present

## 2021-02-27 DIAGNOSIS — I5033 Acute on chronic diastolic (congestive) heart failure: Secondary | ICD-10-CM | POA: Diagnosis present

## 2021-02-27 DIAGNOSIS — G4733 Obstructive sleep apnea (adult) (pediatric): Secondary | ICD-10-CM | POA: Diagnosis present

## 2021-02-27 DIAGNOSIS — N1831 Chronic kidney disease, stage 3a: Secondary | ICD-10-CM | POA: Diagnosis present

## 2021-02-27 DIAGNOSIS — I129 Hypertensive chronic kidney disease with stage 1 through stage 4 chronic kidney disease, or unspecified chronic kidney disease: Secondary | ICD-10-CM

## 2021-02-27 DIAGNOSIS — I10 Essential (primary) hypertension: Secondary | ICD-10-CM | POA: Diagnosis not present

## 2021-02-27 DIAGNOSIS — E782 Mixed hyperlipidemia: Secondary | ICD-10-CM | POA: Diagnosis present

## 2021-02-27 DIAGNOSIS — E8770 Fluid overload, unspecified: Secondary | ICD-10-CM | POA: Diagnosis present

## 2021-02-27 DIAGNOSIS — Z79890 Hormone replacement therapy (postmenopausal): Secondary | ICD-10-CM | POA: Diagnosis not present

## 2021-02-27 DIAGNOSIS — Z85850 Personal history of malignant neoplasm of thyroid: Secondary | ICD-10-CM | POA: Diagnosis not present

## 2021-02-27 DIAGNOSIS — N183 Chronic kidney disease, stage 3 unspecified: Secondary | ICD-10-CM

## 2021-02-27 DIAGNOSIS — N179 Acute kidney failure, unspecified: Secondary | ICD-10-CM | POA: Diagnosis present

## 2021-02-27 DIAGNOSIS — Z9114 Patient's other noncompliance with medication regimen: Secondary | ICD-10-CM | POA: Diagnosis not present

## 2021-02-27 DIAGNOSIS — D649 Anemia, unspecified: Secondary | ICD-10-CM

## 2021-02-27 DIAGNOSIS — I700 Atherosclerosis of aorta: Secondary | ICD-10-CM

## 2021-02-27 DIAGNOSIS — Z8541 Personal history of malignant neoplasm of cervix uteri: Secondary | ICD-10-CM

## 2021-02-27 DIAGNOSIS — Z20822 Contact with and (suspected) exposure to covid-19: Secondary | ICD-10-CM | POA: Diagnosis present

## 2021-02-27 DIAGNOSIS — I130 Hypertensive heart and chronic kidney disease with heart failure and stage 1 through stage 4 chronic kidney disease, or unspecified chronic kidney disease: Secondary | ICD-10-CM | POA: Diagnosis not present

## 2021-02-27 DIAGNOSIS — I509 Heart failure, unspecified: Secondary | ICD-10-CM

## 2021-02-27 DIAGNOSIS — I2729 Other secondary pulmonary hypertension: Secondary | ICD-10-CM | POA: Diagnosis present

## 2021-02-27 DIAGNOSIS — I2781 Cor pulmonale (chronic): Secondary | ICD-10-CM | POA: Diagnosis present

## 2021-02-27 DIAGNOSIS — E039 Hypothyroidism, unspecified: Secondary | ICD-10-CM

## 2021-02-27 DIAGNOSIS — D631 Anemia in chronic kidney disease: Secondary | ICD-10-CM | POA: Diagnosis present

## 2021-02-27 LAB — BASIC METABOLIC PANEL
Anion gap: 7 (ref 5–15)
BUN: 25 mg/dL — ABNORMAL HIGH (ref 8–23)
CO2: 29 mmol/L (ref 22–32)
Calcium: 9.4 mg/dL (ref 8.9–10.3)
Chloride: 105 mmol/L (ref 98–111)
Creatinine, Ser: 1.23 mg/dL — ABNORMAL HIGH (ref 0.44–1.00)
GFR, Estimated: 49 mL/min — ABNORMAL LOW (ref >60)
Glucose, Bld: 123 mg/dL — ABNORMAL HIGH (ref 70–99)
Potassium: 4.6 mmol/L (ref 3.5–5.1)
Sodium: 141 mmol/L (ref 135–145)

## 2021-02-27 LAB — CBC
HCT: 31.1 % — ABNORMAL LOW (ref 36.0–46.0)
HCT: 31.2 % — ABNORMAL LOW (ref 36.0–46.0)
Hemoglobin: 9.6 g/dL — ABNORMAL LOW (ref 12.0–15.0)
Hemoglobin: 9.5 g/dL — ABNORMAL LOW (ref 12.0–15.0)
MCH: 31.3 pg (ref 26.0–34.0)
MCH: 31.8 pg (ref 26.0–34.0)
MCHC: 30.9 g/dL (ref 30.0–36.0)
MCHC: 30.4 g/dL (ref 30.0–36.0)
MCV: 101.3 fL — ABNORMAL HIGH (ref 80.0–100.0)
MCV: 104.3 fL — ABNORMAL HIGH (ref 80.0–100.0)
Platelets: 134 10*3/uL — ABNORMAL LOW (ref 150–400)
Platelets: 120 10*3/uL — ABNORMAL LOW (ref 150–400)
RBC: 3.07 MIL/uL — ABNORMAL LOW (ref 3.87–5.11)
RBC: 2.99 MIL/uL — ABNORMAL LOW (ref 3.87–5.11)
RDW: 14.4 % (ref 11.5–15.5)
RDW: 14.3 % (ref 11.5–15.5)
WBC: 8.7 10*3/uL (ref 4.0–10.5)
WBC: 5.6 10*3/uL (ref 4.0–10.5)
nRBC: 0 % (ref 0.0–0.2)
nRBC: 0 % (ref 0.0–0.2)

## 2021-02-27 LAB — BRAIN NATRIURETIC PEPTIDE: B Natriuretic Peptide: 758.6 pg/mL — ABNORMAL HIGH (ref 0.0–100.0)

## 2021-02-27 LAB — CREATININE, SERUM
Creatinine, Ser: 1.24 mg/dL — ABNORMAL HIGH (ref 0.44–1.00)
GFR, Estimated: 48 mL/min — ABNORMAL LOW (ref >60)

## 2021-02-27 LAB — TROPONIN I (HIGH SENSITIVITY)
Troponin I (High Sensitivity): 9 ng/L (ref <18)
Troponin I (High Sensitivity): 10 ng/L (ref <18)

## 2021-02-27 LAB — D-DIMER, QUANTITATIVE: D-Dimer, Quant: 1 ug/mL-FEU — ABNORMAL HIGH (ref 0.00–0.50)

## 2021-02-27 LAB — RESP PANEL BY RT-PCR (FLU A&B, COVID) ARPGX2
Influenza A by PCR: NEGATIVE
Influenza B by PCR: NEGATIVE
SARS Coronavirus 2 by RT PCR: NEGATIVE

## 2021-02-27 LAB — HIV ANTIBODY (ROUTINE TESTING W REFLEX): HIV Screen 4th Generation wRfx: NONREACTIVE

## 2021-02-27 MED ORDER — SODIUM CHLORIDE 0.9% FLUSH: 3.0000 mL | INTRAVENOUS | Status: DC | PRN

## 2021-02-27 MED ORDER — ACETAMINOPHEN 325 MG PO TABS
650.0000 mg | ORAL_TABLET | ORAL | Status: DC | PRN
  Administered 2021-03-03: 650 mg via ORAL
  Filled 2021-02-27: qty 2

## 2021-02-27 MED ORDER — ASPIRIN EC 81 MG PO TBEC
81.0000 mg | DELAYED_RELEASE_TABLET | Freq: Every day | ORAL | Status: DC
  Administered 2021-02-27 – 2021-03-04 (×6): 81 mg via ORAL
  Filled 2021-02-27 (×6): qty 1

## 2021-02-27 MED ORDER — NEBIVOLOL HCL 10 MG PO TABS
20.0000 mg | ORAL_TABLET | Freq: Every day | ORAL | Status: DC
  Administered 2021-02-28 – 2021-03-04 (×5): 20 mg via ORAL
  Filled 2021-02-27 (×5): qty 2

## 2021-02-27 MED ORDER — SODIUM CHLORIDE 0.9 % IV SOLN: 250.0000 mL | INTRAVENOUS | Status: DC | PRN

## 2021-02-27 MED ORDER — LEVOTHYROXINE SODIUM 75 MCG PO TABS
150.0000 ug | ORAL_TABLET | Freq: Every day | ORAL | Status: DC
  Administered 2021-02-28 – 2021-03-04 (×5): 150 ug via ORAL
  Filled 2021-02-27 (×5): qty 2

## 2021-02-27 MED ORDER — ALBUTEROL SULFATE HFA 108 (90 BASE) MCG/ACT IN AERS
2.0000 | INHALATION_SPRAY | RESPIRATORY_TRACT | Status: DC | PRN
  Filled 2021-02-27: qty 6.7

## 2021-02-27 MED ORDER — ENOXAPARIN SODIUM 40 MG/0.4ML IJ SOSY
40.0000 mg | PREFILLED_SYRINGE | INTRAMUSCULAR | Status: DC
  Administered 2021-02-27 – 2021-03-01 (×3): 40 mg via SUBCUTANEOUS
  Filled 2021-02-27 (×3): qty 0.4

## 2021-02-27 MED ORDER — FERROUS SULFATE 325 (65 FE) MG PO TABS
325.0000 mg | ORAL_TABLET | Freq: Two times a day (BID) | ORAL | Status: DC
  Administered 2021-02-28 – 2021-03-04 (×8): 325 mg via ORAL
  Filled 2021-02-27 (×8): qty 1

## 2021-02-27 MED ORDER — FUROSEMIDE 10 MG/ML IJ SOLN
40.0000 mg | Freq: Two times a day (BID) | INTRAMUSCULAR | Status: DC
  Administered 2021-02-27 – 2021-02-28 (×3): 40 mg via INTRAVENOUS
  Filled 2021-02-27 (×3): qty 4

## 2021-02-27 MED ORDER — NEBIVOLOL HCL 20 MG PO TABS: 1.0000 | ORAL_TABLET | Freq: Every day | ORAL | Status: DC

## 2021-02-27 MED ORDER — IOHEXOL 350 MG/ML SOLN
75.0000 mL | Freq: Once | INTRAVENOUS | Status: AC | PRN
  Administered 2021-02-27: 75 mL via INTRAVENOUS

## 2021-02-27 MED ORDER — FUROSEMIDE 10 MG/ML IJ SOLN
60.0000 mg | Freq: Once | INTRAMUSCULAR | Status: AC
  Administered 2021-02-27: 60 mg via INTRAVENOUS
  Filled 2021-02-27: qty 6

## 2021-02-27 MED ORDER — INTEGRA PLUS PO CAPS: 1.0000 | ORAL_CAPSULE | Freq: Two times a day (BID) | ORAL | Status: DC

## 2021-02-27 MED ORDER — SODIUM CHLORIDE 0.9% FLUSH
3.0000 mL | Freq: Two times a day (BID) | INTRAVENOUS | Status: DC
  Administered 2021-02-27 – 2021-03-04 (×9): 3 mL via INTRAVENOUS

## 2021-02-27 MED ORDER — ONDANSETRON HCL 4 MG/2ML IJ SOLN: 4.0000 mg | Freq: Four times a day (QID) | INTRAMUSCULAR | Status: DC | PRN

## 2021-02-27 MED ORDER — LORATADINE 10 MG PO TABS
10.0000 mg | ORAL_TABLET | Freq: Every day | ORAL | Status: DC
  Administered 2021-02-28 – 2021-03-04 (×5): 10 mg via ORAL
  Filled 2021-02-27 (×5): qty 1

## 2021-02-27 MED ORDER — SENNOSIDES-DOCUSATE SODIUM 8.6-50 MG PO TABS: 1.0000 | ORAL_TABLET | Freq: Two times a day (BID) | ORAL | Status: DC | PRN

## 2021-02-27 MED ORDER — ROSUVASTATIN CALCIUM 5 MG PO TABS
10.0000 mg | ORAL_TABLET | Freq: Every day | ORAL | Status: DC
  Administered 2021-02-27 – 2021-03-03 (×5): 10 mg via ORAL
  Filled 2021-02-27 (×6): qty 2

## 2021-02-27 MED ORDER — OLOPATADINE HCL 0.1 % OP SOLN
1.0000 [drp] | Freq: Two times a day (BID) | OPHTHALMIC | Status: DC
  Administered 2021-02-27 – 2021-03-04 (×10): 1 [drp] via OPHTHALMIC
  Filled 2021-02-27: qty 5

## 2021-02-27 MED ORDER — AMLODIPINE BESYLATE 10 MG PO TABS
10.0000 mg | ORAL_TABLET | Freq: Every day | ORAL | Status: DC
  Administered 2021-02-27 – 2021-02-28 (×2): 10 mg via ORAL
  Filled 2021-02-27 (×2): qty 1

## 2021-02-27 MED ORDER — MOMETASONE FURO-FORMOTEROL FUM 100-5 MCG/ACT IN AERO
2.0000 | INHALATION_SPRAY | Freq: Two times a day (BID) | RESPIRATORY_TRACT | Status: DC
  Administered 2021-02-28 – 2021-03-04 (×6): 2 via RESPIRATORY_TRACT
  Filled 2021-02-27 (×2): qty 8.8

## 2021-02-27 NOTE — ED Notes (Signed)
Attempted to call report. RN told nurse was still giving report. Return phone number given.

## 2021-02-27 NOTE — ED Notes (Signed)
Ambulated pt in the room, patient's O2 dropped to 93% while ambulating and talking. Patient was on 3 L of O2.

## 2021-02-27 NOTE — ED Provider Notes (Addendum)
3:42 PM Patient taken in signout from [place].  Patient here is increased volume overload despite her torsemide twice daily.  She has been hypoxic into the 70s here is 87% on room air currently requiring oxygen supplementation.  Patient given latex here.  Currently awaiting evaluation by Dr. Tanja Port of [facility] cardiovascular.  He plans on coming to see her after his clinic ends.   .Critical Care Performed by: Margarita Mail, PA-C Authorized by: Margarita Mail, PA-C   Critical care provider statement:    Critical care time (minutes):  50   Critical care time was exclusive of:  Separately billable procedures and treating other patients   Critical care was necessary to treat or prevent imminent or life-threatening deterioration of the following conditions:  Respiratory failure   Critical care was time spent personally by me on the following activities:  Discussions with consultants, evaluation of patient's response to treatment, examination of patient, ordering and performing treatments and interventions, ordering and review of laboratory studies, ordering and review of radiographic studies, pulse oximetry, re-evaluation of patient's condition, obtaining history from patient or surrogate and review of old charts   Patient here with obvious volume overload requiring 02 for hypoxia.  On my questioning in conjunction with Dr. Terri Skains pt reports that she has been out of her medication for several days and recently took a long train ride.  Will obtain D-dimer- admit for CHF exacerbation. Strict intake and output.  Case discussed with Dr. Almon Hercules follow up on dimer results  Patient dimer is positive- will obtain CTA   CTA negative.   DDX-The emergent differential diagnosis for shortness of breath includes, but is not limited to, Pulmonary edema, bronchoconstriction, Pneumonia, Pulmonary embolism, Pneumotherax/ Hemothorax, Dysrythmia, ACS.   Patient here with known CHF- obvious volume  overload- hypoxic resp failure- negative for PE. Patient admitted for diuresis and respiratory support. All labs and imaging reviewed and interpreted.   Margarita Mail, PA-C 02/28/21 0957    Margarita Mail, PA-C 02/28/21 4782    Quintella Reichert, MD 02/28/21 1850

## 2021-02-27 NOTE — Consult Note (Signed)
CARDIOLOGY CONSULT NOTE  Patient ID: Bridget Lamb MRN: [id number] DOB/AGE: 05/12/1956 64 y.o.  Admit date: 02/27/2021 Attending physician: Default, Provider, MD Primary Physician:  Spencer, Sara C, PA-C  Outpatient Cardiologist: Dr. Jay Ganji Inpatient Cardiologist: Sunit Tolia, DO, FACC  Chief complaint: Shortness of breath  Reason of consultation: Shortness of breath Referring provider: Hina Khatri, PA-C  HPI:  Bridget Lamb is a 64 y.o. Caucasian female who presents with a chief complaint of " shortness of breath." Her past medical history and cardiovascular risk factors include: HFpEF, asthma, hypertension with chronic kidney disease, hyperlipidemia, cervical cancer, postmenopausal female, advanced age, obesity due to excess calories..  Patient states that she is been experiencing shortness of breath for the last several weeks but has been getting progressively worse.  This morning she checked her oxygen saturation at home and it would range anywhere between 60-78% on room air.  She is currently not on home O2.  Along with her shortness of breath present at rest and with effort late activities she started experiencing chest tightness and therefore EMS was called the patient was brought to the hospital for further evaluation and management.  Upon further questioning patient states that she has been low on her torsemide and instead of taking it twice a day she is taking it once a day.  Questionable dietary indiscretion as she has been traveling.  But states that she has been taking her other cardiac medications as prescribed.  Patient recently traveled on a train from here to [place] with multiple stops / detours and has been immobile for prolonged period of time as well.  No prior history of DVT or PE.  EKG on arrival notes normal sinus rhythm without underlying injury pattern.  High sensitive troponin is negative x2.  Patient is a BNP is elevated at 758.  Cardiology is asked  to see the patient for further recommendations with regard to shortness of breath.  ALLERGIES: Allergies  Allergen Reactions  . Procardia [Nifedipine] Other (See Comments)    Ineffective     PAST MEDICAL HISTORY: Past Medical History:  Diagnosis Date  . Allergic rhinitis   . Anemia    hematology, Dr. Mohamed, last 01/2011  . Asthma   . Cervix cancer (HCC)   . CHF (congestive heart failure) (HCC) 2004  . Chronic kidney disease    protein in urine  . Complication of anesthesia   . Dyslipidemia   . Dyspnea    on excertion  . Endometrial cancer (HCC)   . H/O echocardiogram 01/07/04   mild LVH, nl LV systolic function, EF 60%, left atrial enlargement, mildly thickened aortic and mitral valves  . History of radiation therapy 8/4-21-07/24/20   Pelvis IMRT and HDR, Dr. James Kinard  . History of thyroid cancer   . Hypertension   . Incarcerated ventral hernia 10/07/2013  . Obesity, Class III, BMI 40-49.9 (morbid obesity) (HCC) 10/09/2013  . Osteopenia   . PONV (postoperative nausea and vomiting)   . Sleep apnea   . Umbilical hernia   . Unspecified essential hypertension 10/09/2013  . Unspecified hypothyroidism 10/09/2013    PAST SURGICAL HISTORY: Past Surgical History:  Procedure Laterality Date  . BONE MARROW BIOPSY    . CARDIAC CATHETERIZATION  2005  . CHOLECYSTECTOMY  2003  . COLONOSCOPY  2008  . ROBOTIC ASSISTED TOTAL HYSTERECTOMY WITH BILATERAL SALPINGO OOPHERECTOMY N/A 10/25/2019   Procedure: XI ROBOTIC ASSISTED TOTAL HYSTERECTOMY WITH BILATERAL SALPINGO OOPHORECTOMY;  Surgeon: Tucker, Katherine R, MD;  Location:   [facility];  Service: Gynecology;  Laterality: N/A;  . SENTINEL NODE BIOPSY N/A 10/25/2019   Procedure: SENTINEL LYMPH NODE BIOPSY;  Surgeon: Lafonda Mosses, MD;  Location: [facility];  Service: Gynecology;  Laterality: N/A;  . THYROID SURGERY     partial thyroidectomy  . TOTAL THYROIDECTOMY  2009  . VENTRAL HERNIA REPAIR N/A 10/07/2013   Procedure: HERNIA  REPAIR VENTRAL ADULT;  Surgeon: Gwenyth Ober, MD;  Location: [place];  Service: General;  Laterality: N/A;    FAMILY HISTORY: The patient family history includes Breast cancer in her maternal aunt; Hypertension in her mother; Lung cancer in her father; Pancreatic cancer in her mother.   SOCIAL HISTORY:  The patient  reports that she has never smoked. She has never used smokeless tobacco. She reports that she does not drink alcohol and does not use drugs.  MEDICATIONS: Current Outpatient Medications  Medication Instructions  . albuterol (PROVENTIL HFA;VENTOLIN HFA) 108 (90 Base) MCG/ACT inhaler 2 puffs, Inhalation, Every 4 hours PRN  . amLODipine (NORVASC) 5 MG tablet Take 2 tablets by mouth once daily  . aspirin EC 81 mg, Oral, Daily  . Calcium Carbonate-Vitamin D (CALCIUM 600+D PO) 1 tablet, Oral, 2 times daily  . DULERA 100-5 MCG/ACT AERO 2 puffs, Inhalation, 2 times daily  . FeFum-FePoly-FA-B Cmp-C-Biot (INTEGRA PLUS) CAPS 1 capsule, Oral, 2 times daily  . levothyroxine (SYNTHROID) 150 mcg, Oral, Daily before breakfast  . loratadine (CLARITIN) 10 mg, Oral, Daily  . Nebivolol HCl 20 MG TABS Take 1 tablet by mouth once daily  . olopatadine (PATANOL) 0.1 % ophthalmic solution 1 drop, Both Eyes, 2 times daily  . rosuvastatin (CRESTOR) 10 mg, Oral, Daily at bedtime  . senna-docusate (SENOKOT-S) 8.6-50 MG tablet 1 tablet, Oral, 2 times daily PRN  . torsemide (DEMADEX) 20 mg, Oral, 2 times daily    REVIEW OF SYSTEMS: Review of Systems  Constitutional: Negative for chills and fever.  HENT: Negative for hoarse voice and nosebleeds.   Eyes: Negative for discharge, double vision and pain.  Cardiovascular: Positive for dyspnea on exertion and leg swelling. Negative for chest pain, claudication, near-syncope, orthopnea, palpitations, paroxysmal nocturnal dyspnea and syncope.  Respiratory: Positive for shortness of breath. Negative for hemoptysis.   Musculoskeletal: Negative for muscle cramps  and myalgias.  Gastrointestinal: Negative for abdominal pain, constipation, diarrhea, hematemesis, hematochezia, melena, nausea and vomiting.  Neurological: Negative for dizziness and light-headedness.    PHYSICAL EXAM: Vitals with BMI 02/27/2021 02/27/2021 02/27/2021  Height - - -  Weight - - -  BMI - - -  Systolic - - 109  Diastolic - - 58  Pulse 70 72 66    No intake or output data in the 24 hours ending 02/27/21 1648  Net IO Since Admission: No IO data has been entered for this period [02/27/21 1648]  CONSTITUTIONAL: Appears older than stated age, hemodynamically stable, No acute distress.  SKIN: Skin is warm and dry. No rash noted. No cyanosis. No pallor. No jaundice HEAD: Normocephalic and atraumatic.  EYES: No scleral icterus MOUTH/THROAT: Moist oral membranes.  NECK: JVD present. No thyromegaly noted. No carotid bruits  LYMPHATIC: No visible cervical adenopathy.  CHEST Normal respiratory effort. No intercostal retractions  LUNGS: Decreased air entry bilaterally with rales present over the left posterior chest wall, No wheezes.  CARDIOVASCULAR: Regular, positive S1-S2, no murmurs rubs or gallops appreciated. ABDOMINAL: Obese, soft, nontender, nondistended, positive bowel sounds in all 4 quadrants no apparent ascites.  EXTREMITIES: +2 bilateral pitting edema  up to the knees.  Warm to touch bilaterally.  Pulses present but faint HEMATOLOGIC: No significant bruising NEUROLOGIC: Oriented to person, place, and time. Nonfocal. Normal muscle tone.  PSYCHIATRIC: Normal mood and affect. Normal behavior. Cooperative  RADIOLOGY: DG Chest 2 View  Result Date: 02/27/2021 CLINICAL DATA:  sob and wheezing ,hx chf,asthmachf/sob EXAM: CHEST - 2 VIEW COMPARISON:  Radiograph 02/24/2020 FINDINGS: Stable enlarged cardiac silhouette. There is central venous congestion in mild peripheral interstitial markings increased from prior. Small effusions. No focal consolidation. Degenerative osteophytosis of  the spine. IMPRESSION: Cardiomegaly central venous congestion, interstitial prominence, and small effusions suggest volume overload/congestive heart failure. Electronically Signed   By: Suzy Bouchard M.D.   On: 02/27/2021 10:19    LABORATORY DATA: Lab Results  Component Value Date   WBC 8.7 02/27/2021   HGB 9.6 (L) 02/27/2021   HCT 31.1 (L) 02/27/2021   MCV 101.3 (H) 02/27/2021   PLT 134 (L) 02/27/2021    Recent Labs  Lab 02/27/21 0955  NA 141  K 4.6  CL 105  CO2 29  BUN 25*  CREATININE 1.23*  CALCIUM 9.4  GLUCOSE 123*    Lipid Panel     Component Value Date/Time   CHOL 190 01/31/2012 0909   TRIG 84 01/31/2012 0909   HDL 55 01/31/2012 0909   CHOLHDL 3.5 01/31/2012 0909   VLDL 17 01/31/2012 0909   LDLCALC 118 (H) 01/31/2012 0909    BNP (last 3 results) Recent Labs    05/20/20 0931 02/27/21 0956  BNP 82.7 758.6*    HEMOGLOBIN A1C No results found for: HGBA1C, MPG  Cardiac Panel (last 3 results) Recent Labs    02/27/21 0955 02/27/21 1214  TROPONINIHS 9 10   TSH No results for input(s): TSH in the last 8760 hours.    CARDIAC DATABASE: EKG: 02/27/2021: Normal sinus rhythm, 77 bpm, normal axis, without underlying ischemia or injury pattern  Echocardiogram: 02/21/2020: 1. Left ventricular ejection fraction, by estimation, is 60 to 65%. The left ventricle has normal function. The left ventricle has no regional wall motion abnormalities. There is mild concentric left ventricular hypertrophy.  Left ventricular diastolic parameters are consistent with Grade II diastolic dysfunction (pseudonormalization).  Elevated left ventricular end-diastolic pressure.  2. Right ventricular systolic function is normal. The right ventricular size is normal. There is mildly elevated pulmonary artery systolic pressure.  3. Left atrial size was moderately dilated.  4. The mitral valve is normal in structure. Trivial mitral valve regurgitation. No evidence of mitral stenosis.   5. The aortic valve was not well visualized. Aortic valve regurgitation is not visualized. No aortic stenosis is present.  6. The inferior vena cava is dilated in size with <50% respiratory variability, suggesting right atrial pressure of 15 mmHg.   Lexiscan Sestamibi stress test 03/10/2020: No previous exam available for comparison. Lexiscan nuclear stress test performed using 1-day protocol. Stress EKG is non-diagnostic, as this is pharmacological stress test. Normal myocardial perfusion. Stress LVEF 59%. Low risk study.   IMPRESSION & RECOMMENDATIONS: Bridget Lamb is a 65 y.o. Caucasian female whose past medical history and cardiovascular risk factors include: HFpEF, asthma, hypertension with chronic kidney disease, hyperlipidemia, cervical cancer, postmenopausal female, advanced age, obesity due to excess calories..  Shortness of breath: Multifactorial (acute HFpEF exacerbation, anemia, prolonged immobilization, dietary indiscretion, degree of medical noncompliance) Acute heart failure with preserved EF, stage C, NYHA class III Hypertension with chronic kidney disease stage III Hyperlipidemia Obesity due to excess calories. History of cervical cancer,  currently in remission  Patient is currently in acute exacerbation of heart failure with preserved EF.  On clinical examination patient has evidence of JVP, volume overload with lower extremity swelling up to the knees bilaterally, pulmonary vascular congestion on chest x-ray, and BNP elevated compared to her baseline.  Recommend inpatient observation to help facilitate diuresis with parenteral diuretics with close monitoring of electrolytes.  Strict I's and O's, daily weights.  Resume home medications.  We will further uptitrate to GDMT during this hospitalization.  Educated on the importance of medication compliance, decrease salt intake.  With her recent travel and prolonged immobilization recommend checking a D-dimer.  If elevated  would recommend checking CT PE protocol to rule out PE.  Echocardiogram will be ordered to evaluate for structural heart disease and left ventricular systolic function.  Patient symptoms of chest tightness appear to be noncardiac in etiology and her high sensitive troponins are negative x2.  EKG shows normal sinus rhythm without underlying injury pattern.  She had a recent nuclear stress test was overall low risk probability for obstructive CAD.  Continue to monitor.  Will defer the workup for anemia to primary team.   Continue management of her chronic comorbid conditions as per primary team.  Plan of care discussed with ER provider Margarita Mail who was present at the time of evaluation at bedside.  Thank you for allowing [place] to participate in the care of this patient we will follow the patient with you.   Total encounter time 88 minutes. *Total Encounter Time as defined by the Centers for Medicare and Medicaid Services includes, in addition to the face-to-face time of a patient visit (documented in the note above) non-face-to-face time: obtaining and reviewing outside history, ordering and reviewing medications, tests or procedures, care coordination (communications with other health care professionals or caregivers) and documentation in the medical record.  Patient's questions and concerns were addressed to her satisfaction. She voices understanding of the instructions provided during this encounter.   This note was created using a voice recognition software as a result there may be grammatical errors inadvertently enclosed that do not reflect the nature of this encounter. Every attempt is made to correct such errors.  Mechele Claude [facility]  Pager: [phone] Office: [phone] 02/27/2021, 4:48 PM

## 2021-02-27 NOTE — ED Triage Notes (Signed)
Pt here from home with c/o sob and wheezing , pt received 125 solumedrol and albuterol from EMS , pt thinks that she was suppose to go home on O2 last time she was here but did not get it

## 2021-02-27 NOTE — H&P (Signed)
HISTORY AND PHYSICAL       PATIENT DETAILS Name: Bridget Lamb Age: 65 y.o. Sex: female Date of Birth: [birth year] Admit Date: 02/27/2021 JIR:CVELFYB, Domingo Pulse, PA-C   Patient coming from: Shortness of breath   CHIEF COMPLAINT:  Worsening shortness of breath/worsening leg edema x1 week  HPI: Bridget Lamb is a 65 y.o. female with medical history significant of chronic diastolic heart failure, asthma, HLD, HTN, endometrial/cervical cancer-who presented to the ED for progressively worsening shortness of breath.  Per patient-she does have some amount of chronic shortness of breath at baseline, however for the past week or so-she has had progressively worsening shortness of breath.  Her shortness of breath is mostly with activity.  She also has worsening lower extremity edema.  When she checked her pulse ox at home and it was around 60/70's-EMS was called-she was subsequently brought to the emergency room-when she first arrived-she was on 6 L of oxygen to maintain O2 saturations.Patient also acknowledges intermittent exertional chest pain as well.  Per patient-she is taking her torsemide only once a day when she is supposed to be taking twice a day, since she was about to run out of it-she only started taking it once a day.  She recently has been traveling and has had dietary indiscretion as well.  No history of nausea, vomiting or diarrhea.  ED Course: Found to have CHF exacerbation-BNP significantly elevated-chest x-ray findings consistent with CHF.  Cardiology consulted.  Given 1 dose of IV Lasix-hospitalist asked to admit this patient.  Note: Lives at: Home Mobility:  Independent Chronic Indwelling Foley:no   REVIEW OF SYSTEMS:  Constitutional:   No  weight loss, night sweats,  Fevers, chills, fatigue.  HEENT:    No headaches, Dysphagia,Tooth/dental problems,Sore throat  Cardio-vascular: No chest pain,Orthopnea, PND,lower extremity edema, anasarca,  palpitations  GI:  No heartburn, indigestion, abdominal pain, nausea, vomiting, diarrhea, melena or hematochezia  Resp: No shortness of breath, cough, hemoptysis,plueritic chest pain.   Skin:  No rash or lesions.  GU:  No dysuria, change in color of urine, no urgency or frequency.  No flank pain.  Musculoskeletal: No joint pain or swelling.  No decreased range of motion.  No back pain.  Endocrine: No heat intolerance, no cold intolerance, no polyuria, no polydipsia  Psych: No change in mood or affect. No depression or anxiety.  No memory loss.   ALLERGIES:   Allergies  Allergen Reactions  . Procardia [Nifedipine] Other (See Comments)    Ineffective     PAST MEDICAL HISTORY: Past Medical History:  Diagnosis Date  . Allergic rhinitis   . Anemia    hematology, Dr. Julien Nordmann, last 01/2011  . Asthma   . Cervix cancer ([place])   . CHF (congestive heart failure) ([place]) 2004  . Chronic kidney disease    protein in urine  . Complication of anesthesia   . Dyslipidemia   . Dyspnea    on excertion  . Endometrial cancer ([place])   . H/O echocardiogram 01/07/04   mild LVH, nl LV systolic function, EF 01%, left atrial enlargement, mildly thickened aortic and mitral valves  . History of radiation therapy 8/4-21-07/24/20   Pelvis IMRT and HDR, Dr. Gery Pray  . History of thyroid cancer   . Hypertension   . Incarcerated ventral hernia 10/07/2013  . Obesity, Class III, BMI 40-49.9 (morbid obesity) ([place]) 10/09/2013  . Osteopenia   . PONV (postoperative nausea and vomiting)   . Sleep  apnea   . Umbilical hernia   . Unspecified essential hypertension 10/09/2013  . Unspecified hypothyroidism 10/09/2013    PAST SURGICAL HISTORY: Past Surgical History:  Procedure Laterality Date  . BONE MARROW BIOPSY    . CARDIAC CATHETERIZATION  2005  . CHOLECYSTECTOMY  2003  . COLONOSCOPY  2008  . ROBOTIC ASSISTED TOTAL HYSTERECTOMY WITH BILATERAL SALPINGO OOPHERECTOMY N/A 10/25/2019    Procedure: XI ROBOTIC ASSISTED TOTAL HYSTERECTOMY WITH BILATERAL SALPINGO OOPHORECTOMY;  Surgeon: Lafonda Mosses, MD;  Location: [facility];  Service: Gynecology;  Laterality: N/A;  . SENTINEL NODE BIOPSY N/A 10/25/2019   Procedure: SENTINEL LYMPH NODE BIOPSY;  Surgeon: Lafonda Mosses, MD;  Location: [facility];  Service: Gynecology;  Laterality: N/A;  . THYROID SURGERY     partial thyroidectomy  . TOTAL THYROIDECTOMY  2009  . VENTRAL HERNIA REPAIR N/A 10/07/2013   Procedure: HERNIA REPAIR VENTRAL ADULT;  Surgeon: Gwenyth Ober, MD;  Location: [facility];  Service: General;  Laterality: N/A;    MEDICATIONS AT HOME: Prior to Admission medications   Medication Sig Start Date End Date Taking? Authorizing Provider  albuterol (PROVENTIL HFA;VENTOLIN HFA) 108 (90 Base) MCG/ACT inhaler Inhale 2 puffs into the lungs every 4 (four) hours as needed for wheezing or shortness of breath. 09/01/18   Aline August, MD  amLODipine (NORVASC) 5 MG tablet Take 2 tablets by mouth once daily 01/16/21   Cantwell, Celeste C, PA-C  aspirin EC 81 MG tablet Take 81 mg by mouth daily.    [provider]  Calcium Carbonate-Vitamin D (CALCIUM 600+D PO) Take 1 tablet by mouth 2 (two) times daily.    [provider]  DULERA 100-5 MCG/ACT AERO Inhale 2 puffs into the lungs in the morning and at bedtime.  03/05/20   [provider]  FeFum-FePoly-FA-B Cmp-C-Biot (INTEGRA PLUS) CAPS Take 1 capsule by mouth 2 (two) times daily.    [provider]  levothyroxine (SYNTHROID, LEVOTHROID) 150 MCG tablet Take 150 mcg by mouth daily before breakfast.     [provider]  loratadine (CLARITIN) 10 MG tablet Take 10 mg by mouth daily.    [provider]  Nebivolol HCl 20 MG TABS Take 1 tablet by mouth once daily 01/16/21   Cantwell, Celeste C, PA-C  olopatadine (PATANOL) 0.1 % ophthalmic solution Place 1 drop into both eyes 2 (two) times daily.  03/26/20   [provider]   rosuvastatin (CRESTOR) 10 MG tablet Take 10 mg by mouth at bedtime. 11/28/17   [provider]  senna-docusate (SENOKOT-S) 8.6-50 MG tablet Take 1 tablet by mouth 2 (two) times daily as needed for moderate constipation. 02/26/20   Mercy Riding, MD  torsemide (DEMADEX) 20 MG tablet Take 1 tablet (20 mg total) by mouth 2 (two) times daily. 12/31/20   Cantwell, Celeste C, PA-C    FAMILY HISTORY: Family History  Problem Relation Age of Onset  . Hypertension Mother   . Pancreatic cancer Mother   . Breast cancer Maternal Aunt   . Lung cancer Father   . Ovarian cancer Neg Hx   . Endometrial cancer Neg Hx   . Colon cancer Neg Hx      SOCIAL HISTORY:  reports that she has never smoked. She has never used smokeless tobacco. She reports that she does not drink alcohol and does not use drugs.  PHYSICAL EXAM: Blood pressure (!) 121/107, pulse 69, temperature 99.4 F (37.4 C), temperature source Oral, resp. rate (!) 25, last menstrual  period 10/22/2011, SpO2 100 %.  General appearance :Awake, alert, not in any distress.  Eyes:, pupils equally reactive to light and accomodation,no scleral icterus.Pink conjunctiva HEENT: Atraumatic and Normocephalic Neck: supple, + JVD.  Resp: Good air entry-bibasilar rales CVS: S1 S2 regular, no murmurs.  GI: Bowel sounds present, Non tender and not distended with no gaurding, rigidity or rebound. Extremities:+++ Edema Neurology:  speech clear,Non focal, sensation is grossly intact. Psychiatric: Normal judgment and insight. Alert and oriented x 3. Normal mood. Musculoskeletal:gait appears to be normal.No digital cyanosis Skin:No Rash, warm and dry Wounds:N/A  LABS ON ADMISSION:  I have personally reviewed following labs and imaging studies  CBC: Recent Labs  Lab 02/27/21 0955  WBC 8.7  HGB 9.6*  HCT 31.1*  MCV 101.3*  PLT 134*    Basic Metabolic Panel: Recent Labs  Lab 02/27/21 0955  NA 141  K 4.6  CL 105  CO2 29  GLUCOSE 123*   BUN 25*  CREATININE 1.23*  CALCIUM 9.4    GFR: CrCl cannot be calculated (Unknown ideal weight.).  Liver Function Tests: No results for input(s): AST, ALT, ALKPHOS, BILITOT, PROT, ALBUMIN in the last 168 hours. No results for input(s): LIPASE, AMYLASE in the last 168 hours. No results for input(s): AMMONIA in the last 168 hours.  Coagulation Profile: No results for input(s): INR, PROTIME in the last 168 hours.  Cardiac Enzymes: No results for input(s): CKTOTAL, CKMB, CKMBINDEX, TROPONINI in the last 168 hours.  BNP (last 3 results) No results for input(s): PROBNP in the last 8760 hours.  HbA1C: No results for input(s): HGBA1C in the last 72 hours.  CBG: No results for input(s): GLUCAP in the last 168 hours.  Lipid Profile: No results for input(s): CHOL, HDL, LDLCALC, TRIG, CHOLHDL, LDLDIRECT in the last 72 hours.  Thyroid Function Tests: No results for input(s): TSH, T4TOTAL, FREET4, T3FREE, THYROIDAB in the last 72 hours.  Anemia Panel: No results for input(s): VITAMINB12, FOLATE, FERRITIN, TIBC, IRON, RETICCTPCT in the last 72 hours.  Urine analysis:    Component Value Date/Time   COLORURINE YELLOW 10/22/2019 [place] 10/22/2019 1153   LABSPEC 1.010 10/22/2019 1153   PHURINE 6.0 10/22/2019 1153   GLUCOSEU NEGATIVE 10/22/2019 1153   HGBUR MODERATE (A) 10/22/2019 1153   BILIRUBINUR NEGATIVE 10/22/2019 1153   KETONESUR NEGATIVE 10/22/2019 1153   PROTEINUR NEGATIVE 10/22/2019 1153   UROBILINOGEN 1.0 10/06/2013 1752   NITRITE NEGATIVE 10/22/2019 1153   LEUKOCYTESUR NEGATIVE 10/22/2019 1153    Sepsis Labs: Lactic Acid, Venous No results found for: [place]   Microbiology: No results found for this or any previous visit (from the past 240 hour(s)).    RADIOLOGIC STUDIES ON ADMISSION: DG Chest 2 View  Result Date: 02/27/2021 CLINICAL DATA:  sob and wheezing ,hx chf,asthmachf/sob EXAM: CHEST - 2 VIEW COMPARISON:  Radiograph 02/24/2020  FINDINGS: Stable enlarged cardiac silhouette. There is central venous congestion in mild peripheral interstitial markings increased from prior. Small effusions. No focal consolidation. Degenerative osteophytosis of the spine. IMPRESSION: Cardiomegaly central venous congestion, interstitial prominence, and small effusions suggest volume overload/congestive heart failure. Electronically Signed   By: Suzy Bouchard M.D.   On: 02/27/2021 10:19    I have personally reviewed images of chest xray -consistent with pulmonary edema  EKG:  Personally reviewed.  NSR  ASSESSMENT AND PLAN: Acute hypoxic respiratory failure due to HFpEF with exacerbation: Likely precipitated by dietary/medication noncompliance-admit to telemetry-start IV Lasix-monitor weights, I/O's, electrolytes and adjust diuretic regimen accordingly.  Obtaining updated echocardiogram.  ED provider ordering a D-dimer given her travel history to rule out PE-however clinical scenario consistent with CHF decompensation.  If D-dimer is elevated-ED provider (spoke with Margarita Mail, PA-C) will order a CT PE protocol.  Chest pain: Likely atypical-EKG/troponins negative.  HTN: Resume amlodipine/nebivolol-follow and adjust.  Anemia: Obtaining anemia panel-no indication of any blood loss at this point.  Bronchial asthma: Continue usual bronchodilator regimen  Hypothyroidism: Continue Synthroid  History of endometrial adenocarcinoma: Stable for outpatient follow-up with radiation oncology/GYN oncology.  Currently under observation.  OSA: CPAP nightly  Further plan will depend as patient's clinical course evolves and further radiologic and laboratory data become available. Patient will be monitored closely.  Above noted plan was discussed with patient face to face at bedside, she was in agreement.   CONSULTS: Cardiology   DVT Prophylaxis: Prophylactic Lovenox   Code Status: Full Code  Disposition Plan: Likely home with home health  services  Admission status:  Inpatient going to tele   The medical decision making on this patient was of high complexity and the patient is at high risk for clinical deterioration, therefore this is a level 3 visit.    Total time spent  55 minutes.Greater than 50% of this time was spent in counseling, explanation of diagnosis, planning of further management, and coordination of care.  Severity of illness: The appropriate patient status for this patient is INPATIENT. Inpatient status is judged to be reasonable and necessary in order to provide the required intensity of service to ensure the patient's safety. The patient's presenting symptoms, physical exam findings, and initial radiographic and laboratory data in the context of their chronic comorbidities is felt to place them at high risk for further clinical deterioration. Furthermore, it is not anticipated that the patient will be medically stable for discharge from the hospital within 2 midnights of admission. The following factors support the patient status of inpatient.   " The patient's presenting symptoms include shortness of breath/leg edema " The worrisome physical exam findings include 3+ leg edema, hypoxia requiring oxygen supplementation " The initial radiographic and laboratory data are worrisome because of CXR consistent with CHF " The chronic co-morbidities include diastolic heart failure, HTN, hypothyroidism, bronchial asthma   * I certify that at the point of admission it is my clinical judgment that the patient will require inpatient hospital care spanning beyond 2 midnights from the point of admission due to high intensity of service, high risk for further deterioration and high frequency of surveillance required.**  Monserrat Vidaurri [facility] Pager [phone]  If 7PM-7AM, please contact night-coverage  Please page via [email or web address]  Go to [email or web address] and use [facility] universal password to access. If you  do not have the password, please contact the hospital operator.  Locate the [facility] provider you are looking for under [facility] and page to a number that you can be directly reached. If you still have difficulty reaching the provider, please page the [facility] (Director on Call) for the Hospitalists listed on amion for assistance.  02/27/2021, 5:25 PM

## 2021-02-27 NOTE — ED Provider Notes (Signed)
Emergency Medicine Provider Triage Evaluation Note  Bridget Lamb , a 65 y.o. female  was evaluated in triage.  Pt complains of shortness of breath.  Patient states that she has a history of CHF and is on torsemide.  She states she has been taking this medication as well as her about 1 year ago and was post to be discharged on home oxygen but never received this and never followed up regarding not receiving this.  She states yesterday her chronic shortness of breath started to worsen and this morning became so severe that she called EMS.  EMS noted a wheeze left size and she was given 125 Solu-Medrol as well as albuterol.  Patient states her symptoms have mildly improved but still reports continued dyspnea.  She arrived on 6 L via nasal cannula.  This was introduced to 2 L via nasal cannula with saturations in the high 90s.  Denies any cough, sore throat, nausea, vomiting, chest pain abdominal pain.    Physical Exam  BP (!) 148/69 (BP Location: Right Arm)   Pulse 80   Temp 99.4 F (37.4 C) (Oral)   Resp 20   LMP 10/22/2011   SpO2 99%  Gen:   Awake, no distress   Resp:  Tachypneic on 2 L via nasal cannula, no wheezing, rales, or rhonchi. MSK:   Moves extremities without difficulty, 1+ edema in the bilateral lower extremities Other:    Medical Decision Making  Medically screening exam initiated at 9:45 AM.  Appropriate orders placed.  Bridget Lamb was informed that the remainder of the evaluation will be completed by another provider, this initial triage assessment does not replace that evaluation, and the importance of remaining in the ED until their evaluation is complete.   Bridget Sexton, PA-C 02/27/21 [id number]    Davonna Belling, MD 02/27/21 [phone]

## 2021-02-27 NOTE — ED Provider Notes (Signed)
[facility] EMERGENCY DEPARTMENT Provider Note   CSN: [id number] Arrival date & time: 02/27/21  0927     History No chief complaint on file.   Bridget Lamb is a 65 y.o. female with a past medical history of CHF on torsemide, anemia, obesity, hypertension presenting to the ED with a chief complaint of shortness of breath.  States that her baseline her oxygen levels run in the low 90s.  She was told maybe about a year ago that she should be discharged on home oxygen but was unable to follow-up about this.  However she has felt pretty well up until a few days ago.  She noticed worsening dyspnea on exertion as well as leg swelling despite elevation.  She has been taking her torsemide twice daily.  She was told that she could double up on this medication if her swelling got worse but has not done so recently.  She reports central chest tightness.  She continues to use her Ruthe Mannan but has not required use of her albuterol rescue inhaler recently.  She denies any hemoptysis.  She has been checking her oxygen saturations at home and they have been running in the 70s.  No sick contacts with similar symptoms.  No abdominal pain, vomiting, fever.  She is scheduled to see her cardiologist in a few weeks. She sees Dr. Einar Gip. HPI     Past Medical History:  Diagnosis Date  . Allergic rhinitis   . Anemia    hematology, Dr. Julien Nordmann, last 01/2011  . Asthma   . Cervix cancer ([place])   . CHF (congestive heart failure) ([place]) 2004  . Chronic kidney disease    protein in urine  . Complication of anesthesia   . Dyslipidemia   . Dyspnea    on excertion  . Endometrial cancer ([place])   . H/O echocardiogram 01/07/04   mild LVH, nl LV systolic function, EF 09%, left atrial enlargement, mildly thickened aortic and mitral valves  . History of radiation therapy 8/4-21-07/24/20   Pelvis IMRT and HDR, Dr. Gery Pray  . History of thyroid cancer   . Hypertension   . Incarcerated ventral hernia  10/07/2013  . Obesity, Class III, BMI 40-49.9 (morbid obesity) ([place]) 10/09/2013  . Osteopenia   . PONV (postoperative nausea and vomiting)   . Sleep apnea   . Umbilical hernia   . Unspecified essential hypertension 10/09/2013  . Unspecified hypothyroidism 10/09/2013    Patient Active Problem List   Diagnosis Date Noted  . Vaginal lesion 05/01/2020  . OSA (obstructive sleep apnea) 03/07/2020  . Abnormal findings on diagnostic imaging of lung 03/07/2020  . Acute pulmonary edema ([place]) 02/20/2020  . Hypertensive urgency   . Dyspnea   . Endometrial cancer ([place]) 10/12/2019  . Bilateral leg edema 12/20/2018  . Chronic heart failure with preserved ejection fraction ([place]) 12/20/2018  . Asthma, chronic, unspecified asthma severity, with acute exacerbation 08/29/2018  . Acute respiratory failure with hypoxia and hypercapnia ([place]) 08/29/2018  . Iron deficiency anemia 02/04/2015  . Chronic diastolic CHF (congestive heart failure) ([place]) 12/24/2013  . Obesity, Class III, BMI 40-49.9 (morbid obesity) ([place]) 10/09/2013  . Essential hypertension, benign 01/31/2012  . Dyslipidemia 01/31/2012  . Hypothyroidism 01/31/2012  . Anemia 01/31/2012    Past Surgical History:  Procedure Laterality Date  . BONE MARROW BIOPSY    . CARDIAC CATHETERIZATION  2005  . CHOLECYSTECTOMY  2003  . COLONOSCOPY  2008  . ROBOTIC ASSISTED TOTAL HYSTERECTOMY WITH BILATERAL SALPINGO OOPHERECTOMY  N/A 10/25/2019   Procedure: XI ROBOTIC ASSISTED TOTAL HYSTERECTOMY WITH BILATERAL SALPINGO OOPHORECTOMY;  Surgeon: Lafonda Mosses, MD;  Location: [facility];  Service: Gynecology;  Laterality: N/A;  . SENTINEL NODE BIOPSY N/A 10/25/2019   Procedure: SENTINEL LYMPH NODE BIOPSY;  Surgeon: Lafonda Mosses, MD;  Location: [facility];  Service: Gynecology;  Laterality: N/A;  . THYROID SURGERY     partial thyroidectomy  . TOTAL THYROIDECTOMY  2009  . VENTRAL HERNIA REPAIR N/A 10/07/2013   Procedure: HERNIA REPAIR VENTRAL ADULT;   Surgeon: Gwenyth Ober, MD;  Location: [facility];  Service: General;  Laterality: N/A;     OB History    Gravida  3   Para  1   Term      Preterm      AB  2   Living  1     SAB      IAB      Ectopic      Multiple      Live Births           Obstetric Comments  Last mammogram: 12/25/2018 Last colonoscopy: 2012 Last Pap smear: 08/15/2019, negative, HPV negative History of abnormal Pap smears: No History of sexually transmitted diseases: Denies Age at menarche: 53        Family History  Problem Relation Age of Onset  . Hypertension Mother   . Pancreatic cancer Mother   . Breast cancer Maternal Aunt   . Lung cancer Father   . Ovarian cancer Neg Hx   . Endometrial cancer Neg Hx   . Colon cancer Neg Hx     Social History   Tobacco Use  . Smoking status: Never Smoker  . Smokeless tobacco: Never Used  Vaping Use  . Vaping Use: Never used  Substance Use Topics  . Alcohol use: No  . Drug use: No    Home Medications Prior to Admission medications   Medication Sig Start Date End Date Taking? Authorizing Provider  albuterol (PROVENTIL HFA;VENTOLIN HFA) 108 (90 Base) MCG/ACT inhaler Inhale 2 puffs into the lungs every 4 (four) hours as needed for wheezing or shortness of breath. 09/01/18   Aline August, MD  amLODipine (NORVASC) 5 MG tablet Take 2 tablets by mouth once daily 01/16/21   Cantwell, Celeste C, PA-C  aspirin EC 81 MG tablet Take 81 mg by mouth daily.    [provider]  Calcium Carbonate-Vitamin D (CALCIUM 600+D PO) Take 1 tablet by mouth 2 (two) times daily.    [provider]  DULERA 100-5 MCG/ACT AERO Inhale 2 puffs into the lungs in the morning and at bedtime.  03/05/20   [provider]  FeFum-FePoly-FA-B Cmp-C-Biot (INTEGRA PLUS) CAPS Take 1 capsule by mouth 2 (two) times daily.    [provider]  levothyroxine (SYNTHROID, LEVOTHROID) 150 MCG tablet Take 150 mcg by mouth daily before breakfast.     [provider]  loratadine (CLARITIN) 10 MG tablet Take 10 mg by mouth daily.    [provider]  Nebivolol HCl 20 MG TABS Take 1 tablet by mouth once daily 01/16/21   Cantwell, Celeste C, PA-C  olopatadine (PATANOL) 0.1 % ophthalmic solution Place 1 drop into both eyes 2 (two) times daily.  03/26/20   [provider]  rosuvastatin (CRESTOR) 10 MG tablet Take 10 mg by mouth at bedtime. 11/28/17   [provider]  senna-docusate (SENOKOT-S) 8.6-50 MG tablet Take 1 tablet by mouth 2 (two) times daily  as needed for moderate constipation. 02/26/20   Mercy Riding, MD  torsemide (DEMADEX) 20 MG tablet Take 1 tablet (20 mg total) by mouth 2 (two) times daily. 12/31/20   Cantwell, Celeste C, PA-C    Allergies    Procardia [nifedipine]  Review of Systems   Review of Systems  Constitutional: Negative for appetite change, chills and fever.  HENT: Negative for ear pain, rhinorrhea, sneezing and sore throat.   Eyes: Negative for photophobia and visual disturbance.  Respiratory: Positive for chest tightness and shortness of breath. Negative for cough and wheezing.   Cardiovascular: Positive for leg swelling. Negative for chest pain and palpitations.  Gastrointestinal: Negative for abdominal pain, blood in stool, constipation, diarrhea, nausea and vomiting.  Genitourinary: Negative for dysuria, hematuria and urgency.  Musculoskeletal: Negative for myalgias.  Skin: Negative for rash.  Neurological: Negative for dizziness, weakness and light-headedness.    Physical Exam Updated Vital Signs BP (!) 149/68   Pulse 65   Temp 99.4 F (37.4 C) (Oral)   Resp (!) 22   LMP 10/22/2011   SpO2 100%   Physical Exam Vitals and nursing note reviewed.  Constitutional:      General: She is not in acute distress.    Appearance: She is well-developed.     Comments: On 3 L of oxygen via nasal cannula.  HENT:     Head: Normocephalic and atraumatic.     Nose: Nose normal.  Eyes:      General: No scleral icterus.       Left eye: No discharge.     Conjunctiva/sclera: Conjunctivae normal.  Cardiovascular:     Rate and Rhythm: Normal rate and regular rhythm.     Heart sounds: Normal heart sounds. No murmur heard. No friction rub. No gallop.   Pulmonary:     Effort: Pulmonary effort is normal. No respiratory distress.     Breath sounds: Normal breath sounds.  Abdominal:     General: Bowel sounds are normal. There is no distension.     Palpations: Abdomen is soft.     Tenderness: There is no abdominal tenderness. There is no guarding.  Musculoskeletal:        General: Normal range of motion.     Cervical back: Normal range of motion and neck supple.     Right lower leg: Edema present.     Left lower leg: Edema present.     Comments: 2+ pitting edema to bilateral lower extremities.  No erythema or calf tenderness bilaterally.  2+ DP pulse noted bilaterally.  Skin:    General: Skin is warm and dry.     Findings: No rash.  Neurological:     Mental Status: She is alert.     Motor: No abnormal muscle tone.     Coordination: Coordination normal.     ED Results / Procedures / Treatments   Labs (all labs ordered are listed, but only abnormal results are displayed) Labs Reviewed  BASIC METABOLIC PANEL - Abnormal; Notable for the following components:      Result Value   Glucose, Bld 123 (*)    BUN 25 (*)    Creatinine, Ser 1.23 (*)    GFR, Estimated 49 (*)    All other components within normal limits  CBC - Abnormal; Notable for the following components:   RBC 3.07 (*)    Hemoglobin 9.6 (*)    HCT 31.1 (*)    MCV 101.3 (*)    Platelets 134 (*)  All other components within normal limits  BRAIN NATRIURETIC PEPTIDE - Abnormal; Notable for the following components:   B Natriuretic Peptide 758.6 (*)    All other components within normal limits  TROPONIN I (HIGH SENSITIVITY)  TROPONIN I (HIGH SENSITIVITY)    EKG None  Radiology DG Chest 2 View  Result  Date: 02/27/2021 CLINICAL DATA:  sob and wheezing ,hx chf,asthmachf/sob EXAM: CHEST - 2 VIEW COMPARISON:  Radiograph 02/24/2020 FINDINGS: Stable enlarged cardiac silhouette. There is central venous congestion in mild peripheral interstitial markings increased from prior. Small effusions. No focal consolidation. Degenerative osteophytosis of the spine. IMPRESSION: Cardiomegaly central venous congestion, interstitial prominence, and small effusions suggest volume overload/congestive heart failure. Electronically Signed   By: Suzy Bouchard M.D.   On: 02/27/2021 10:19    Procedures Procedures   Medications Ordered in ED Medications  furosemide (LASIX) injection 60 mg (60 mg Intravenous Given 02/27/21 1510)    ED Course  I have reviewed the triage vital signs and the nursing notes.  Pertinent labs & imaging results that were available during my care of the patient were reviewed by me and considered in my medical decision making (see chart for details).  Clinical Course as of 02/27/21 1520  Fri Feb 27, 2021  1511 Spoke to Dr. Terri Skains, on-call for [facility] cardiology.  Reviewed patient's chart as well as her echo and recent clinic notes.  He states that he will evaluate the patient in consult but recommends obtaining oxygen saturations with ambulation and reassessing after IV Lasix.  He states that 60 mg x 1 dose is okay for today. [HK]  1517 Oxygen saturations 87% on room air while sitting. [HK]    Clinical Course User Index [HK] Delia Heady, PA-C   MDM Rules/Calculators/A&P                          65 year old female presenting to the ED for shortness of breath and chest tightness for the past few days.  Her oxygen levels at home have been in the 70s which is unusual for her as she typically runs in the 90s.  She does not wear supplemental oxygen at baseline but was told about a year ago that she should have been discharged with home oxygen.  She was unable to follow-up to do so.  She denies any  unilateral leg swelling, hemoptysis or fever but she states that her cough has been chronic. EKG shows sinus rhythm, no ischemic changes, no STEMI.  Chest x-ray with findings consistent with volume overload/CHF.  BMP with creatinine of 1.2 which appears at baseline.  BNP elevated to the 700s.  Troponin is is negative x2.  I will order IV Lasix while awaiting cardiology consult.  Her blood pressures are stable here. Spoke to Dr. Terri Skains who will see the patient at the bedside.   Portions of this note were generated with [employer]. Dictation errors may occur despite best attempts at proofreading.  Final Clinical Impression(s) / ED Diagnoses Final diagnoses:  Hypoxia    Rx / DC Orders ED Discharge Orders    None       Delia Heady, PA-C 02/27/21 1522    Lucrezia Starch, MD 02/27/21 2055

## 2021-02-28 ENCOUNTER — Encounter (HOSPITAL_COMMUNITY): Payer: Self-pay | Admitting: Internal Medicine

## 2021-02-28 ENCOUNTER — Other Ambulatory Visit: Payer: Self-pay

## 2021-02-28 DIAGNOSIS — R0902 Hypoxemia: Secondary | ICD-10-CM

## 2021-02-28 LAB — LIPID PANEL
Cholesterol: 117 mg/dL (ref 0–200)
HDL: 49 mg/dL (ref >40)
LDL Cholesterol: 57 mg/dL (ref 0–99)
Total CHOL/HDL Ratio: 2.4 RATIO
Triglycerides: 54 mg/dL (ref <150)
VLDL: 11 mg/dL (ref 0–40)

## 2021-02-28 LAB — IRON AND TIBC
Iron: 34 ug/dL (ref 28–170)
Saturation Ratios: 13 % (ref 10.4–31.8)
TIBC: 265 ug/dL (ref 250–450)
UIBC: 231 ug/dL

## 2021-02-28 LAB — BASIC METABOLIC PANEL
Anion gap: 11 (ref 5–15)
BUN: 30 mg/dL — ABNORMAL HIGH (ref 8–23)
CO2: 27 mmol/L (ref 22–32)
Calcium: 9.2 mg/dL (ref 8.9–10.3)
Chloride: 103 mmol/L (ref 98–111)
Creatinine, Ser: 1.17 mg/dL — ABNORMAL HIGH (ref 0.44–1.00)
GFR, Estimated: 52 mL/min — ABNORMAL LOW (ref >60)
Glucose, Bld: 136 mg/dL — ABNORMAL HIGH (ref 70–99)
Potassium: 4.1 mmol/L (ref 3.5–5.1)
Sodium: 141 mmol/L (ref 135–145)

## 2021-02-28 LAB — VITAMIN B12: Vitamin B-12: 324 pg/mL (ref 180–914)

## 2021-02-28 LAB — RETICULOCYTES
Immature Retic Fract: 26.1 % — ABNORMAL HIGH (ref 2.3–15.9)
RBC.: 2.96 MIL/uL — ABNORMAL LOW (ref 3.87–5.11)
Retic Count, Absolute: 63.9 10*3/uL (ref 19.0–186.0)
Retic Ct Pct: 2.2 % (ref 0.4–3.1)

## 2021-02-28 LAB — FERRITIN: Ferritin: 336 ng/mL — ABNORMAL HIGH (ref 11–307)

## 2021-02-28 LAB — BRAIN NATRIURETIC PEPTIDE: B Natriuretic Peptide: 873.8 pg/mL — ABNORMAL HIGH (ref 0.0–100.0)

## 2021-02-28 LAB — FOLATE: Folate: 37.3 ng/mL (ref >5.9)

## 2021-02-28 LAB — HEMOGLOBIN A1C
Hgb A1c MFr Bld: 5.4 % (ref 4.8–5.6)
Mean Plasma Glucose: 108.28 mg/dL

## 2021-02-28 MED ORDER — SACUBITRIL-VALSARTAN 24-26 MG PO TABS
1.0000 | ORAL_TABLET | Freq: Two times a day (BID) | ORAL | Status: DC
  Administered 2021-02-28 – 2021-03-01 (×2): 1 via ORAL
  Filled 2021-02-28 (×2): qty 1

## 2021-02-28 NOTE — Plan of Care (Signed)
Pt arrived to the unit from ED. Alert and oriented X4. Denies any pain but reports SOB on exertion. Skin assessment completed. Call bell within reach. Pt oriented to room. Meds given, tolerated well. Will continue monitoring closely.

## 2021-02-28 NOTE — Progress Notes (Addendum)
PROGRESS NOTE    Bridget Lamb  [id number] DOB: 10-23-1956 DOA: 02/27/2021 PCP: [name], PA-C   No chief complaint on file.  Brief Narrative:  Bridget Lamb is Bridget Lamb 65 y.o. female with medical history significant of chronic diastolic heart failure, asthma, HLD, HTN, endometrial/cervical cancer-who presented to the ED for progressively worsening shortness of breath.  Per patient-she does have some amount of chronic shortness of breath at baseline, however for the past week or so-she has had progressively worsening shortness of breath.  Her shortness of breath is mostly with activity.  She also has worsening lower extremity edema.  When she checked her pulse ox at home and it was around 60/70's-EMS was called-she was subsequently brought to the emergency room-when she first arrived-she was on 6 L of oxygen to maintain O2 saturations.Patient also acknowledges intermittent exertional chest pain as well.  Per patient-she is taking her torsemide only once Tarynn Garling day when she is supposed to be taking twice Sivan Cuello day, since she was about to run out of it-she only started taking it once Ashea Winiarski day.  She recently has been traveling and has had dietary indiscretion as well.  No history of nausea, vomiting or diarrhea.  Assessment & Plan:   Active Problems:   Acute on chronic diastolic (congestive) heart failure (HCC)  Acute hypoxic respiratory failure due to HFpEF with exacerbation:  Likely precipitated by dietary/medication noncompliance CT PE protocol at presentation without PE, notable for small effusions and pulm edema, likely CHF Currently on 3 L, gradually improving Continue lasix BID  [facility] Cardiology c/s, appreciate recs Echo pending Strict I/O, daily weights  Chest pain: Likely atypical-EKG/troponins negative.  HTN: Resume amlodipine/nebivolol-follow and adjust as needed.  HLD Statin  Anemia: labs c/w AOCD, iron borderline low as well, likely component of iron def -  follow outpatient w/u additionally as indicated.  Retics hypoproliferative.  Bronchial asthma: Continue usual bronchodilator regimen  Hypothyroidism: Continue Synthroid  History of endometrial adenocarcinoma: Stable for outpatient follow-up with radiation oncology/GYN oncology.  Currently under observation.  OSA: CPAP nightly   DVT prophylaxis: lovenox Code Status: full  Family Communication: none at bedside Disposition:   Status is: Inpatient  Remains inpatient appropriate because:Inpatient level of care appropriate due to severity of illness   Dispo: The patient is from: Home              Anticipated d/c is to: Home              Patient currently is not medically stable to d/c.   Difficult to place patient No       Consultants:   cardiology  Procedures: Echo pending  Antimicrobials: Anti-infectives (From admission, onward)   None         Subjective: Feeling better  Objective: Vitals:   02/28/21 0032 02/28/21 0434 02/28/21 0746 02/28/21 1318  BP: (!) 116/54 126/74 118/64 (!) 107/48  Pulse: 65 63 (!) 59 (!) 57  Resp: 19 20 17 18   Temp: (!) 97.5 F (36.4 C) 97.6 F (36.4 C) 97.7 F (36.5 C) 98.2 F (36.8 C)  TempSrc: Oral Oral Oral Oral  SpO2: 99% 92% 100% 93%  Weight:  110.7 kg    Height:        Intake/Output Summary (Last 24 hours) at 02/28/2021 1531 Last data filed at 02/28/2021 1500 Gross per 24 hour  Intake 720 ml  Output 2050 ml  Net -1330 ml   Filed Weights   02/27/21 2115 02/28/21 0434  Weight: 111.8 kg 110.7 kg    Examination:  General exam: Appears calm and comfortable  Respiratory system: Clear to auscultation. Respiratory effort normal. Cardiovascular system: S1 & S2 heard, RRR. +JVD Gastrointestinal system: Abdomen is nondistended, soft and nontender. Central nervous system: Alert and oriented. No focal neurological deficits. Extremities: bilateral LE edema Skin: No rashes, lesions or ulcers Psychiatry: Judgement and  insight appear normal. Mood & affect appropriate.     Data Reviewed: I have personally reviewed following labs and imaging studies  CBC: Recent Labs  Lab 02/27/21 0955 02/27/21 1822  WBC 8.7 5.6  HGB 9.6* 9.5*  HCT 31.1* 31.2*  MCV 101.3* 104.3*  PLT 134* 120*    Basic Metabolic Panel: Recent Labs  Lab 02/27/21 0955 02/27/21 1822 02/28/21 0239  NA 141  --  141  K 4.6  --  4.1  CL 105  --  103  CO2 29  --  27  GLUCOSE 123*  --  136*  BUN 25*  --  30*  CREATININE 1.23* 1.24* 1.17*  CALCIUM 9.4  --  9.2    GFR: Estimated Creatinine Clearance: 58.3 mL/min (Chanel Mcadams) (by C-G formula based on SCr of 1.17 mg/dL (H)).  Liver Function Tests: No results for input(s): AST, ALT, ALKPHOS, BILITOT, PROT, ALBUMIN in the last 168 hours.  CBG: No results for input(s): GLUCAP in the last 168 hours.   Recent Results (from the past 240 hour(s))  Resp Panel by RT-PCR (Flu Kaliyah Gladman&B, Covid) Nasopharyngeal Swab     Status: None   Collection Time: 02/27/21  6:24 PM   Specimen: Nasopharyngeal Swab; Nasopharyngeal(NP) swabs in vial transport medium  Result Value Ref Range Status   SARS Coronavirus 2 by RT PCR NEGATIVE NEGATIVE Final    Comment: (NOTE) SARS-CoV-2 target nucleic acids are NOT DETECTED.  The SARS-CoV-2 RNA is generally detectable in upper respiratory specimens during the acute phase of infection. The lowest concentration of SARS-CoV-2 viral copies this assay can detect is 138 copies/mL. Brynlei Klausner negative result does not preclude SARS-Cov-2 infection and should not be used as the sole basis for treatment or other patient management decisions. Ashaki Frosch negative result may occur with  improper specimen collection/handling, submission of specimen other than nasopharyngeal swab, presence of viral mutation(s) within the areas targeted by this assay, and inadequate number of viral copies(<138 copies/mL). Antoinette Borgwardt negative result must be combined with clinical observations, patient history, and  epidemiological information. The expected result is Negative.  Fact Sheet for Patients:  EntrepreneurPulse.com.au  Fact Sheet for Healthcare Providers:  IncredibleEmployment.be  This test is no t yet approved or cleared by the [place] FDA and  has been authorized for detection and/or diagnosis of SARS-CoV-2 by FDA under an Emergency Use Authorization (EUA). This EUA will remain  in effect (meaning this test can be used) for the duration of the COVID-19 declaration under Section 564(b)(1) of the Act, 21 U.S.C.section 360bbb-3(b)(1), unless the authorization is terminated  or revoked sooner.       Influenza [place] by PCR NEGATIVE NEGATIVE Final   Influenza B by PCR NEGATIVE NEGATIVE Final    Comment: (NOTE) The Xpert Xpress SARS-CoV-2/FLU/RSV plus assay is intended as an aid in the diagnosis of influenza from Nasopharyngeal swab specimens and should not be used as Enaya Howze sole basis for treatment. Nasal washings and aspirates are unacceptable for Xpert Xpress SARS-CoV-2/FLU/RSV testing.  Fact Sheet for Patients: EntrepreneurPulse.com.au  Fact Sheet for Healthcare Providers: IncredibleEmployment.be  This test is not yet approved or cleared by  the [employer] and has been authorized for detection and/or diagnosis of SARS-CoV-2 by FDA under an Emergency Use Authorization (EUA). This EUA will remain in effect (meaning this test can be used) for the duration of the COVID-19 declaration under Section 564(b)(1) of the Act, 21 U.S.C. section 360bbb-3(b)(1), unless the authorization is terminated or revoked.  Performed at [facility], [place] [address] [place], [place] [address]          Radiology Studies: DG Chest 2 View  Result Date: 02/27/2021 CLINICAL DATA:  sob and wheezing ,hx chf,asthmachf/sob EXAM: CHEST - 2 VIEW COMPARISON:  Radiograph 02/24/2020 FINDINGS: Stable enlarged cardiac silhouette.  There is central venous congestion in mild peripheral interstitial markings increased from prior. Small effusions. No focal consolidation. Degenerative osteophytosis of the spine. IMPRESSION: Cardiomegaly central venous congestion, interstitial prominence, and small effusions suggest volume overload/congestive heart failure. Electronically Signed   By: Suzy Bouchard M.D.   On: 02/27/2021 10:19   CT Angio Chest PE W and/or Wo Contrast  Result Date: 02/27/2021 CLINICAL DATA:  Shortness of breath EXAM: CT ANGIOGRAPHY CHEST WITH CONTRAST TECHNIQUE: Multidetector CT imaging of the chest was performed using the standard protocol during bolus administration of intravenous contrast. Multiplanar CT image reconstructions and MIPs were obtained to evaluate the vascular anatomy. CONTRAST:  82mL OMNIPAQUE IOHEXOL 350 MG/ML SOLN COMPARISON:  None. FINDINGS: Cardiovascular: Contrast injection is sufficient to demonstrate satisfactory opacification of the pulmonary arteries to the segmental level. There is no pulmonary embolus or evidence of right heart strain. The size of the main pulmonary artery is normal. Heart is enlarged. The course and caliber of the aorta are normal. There is mild atherosclerotic calcification. Opacification decreased due to pulmonary arterial phase contrast bolus timing. Mediastinum/Nodes: No mediastinal, hilar or axillary lymphadenopathy. Normal visualized thyroid. Thoracic esophageal course is normal. Lungs/Pleura: Small pleural effusions with basilar atelectasis. Areas of ground-glass opacity within both lungs. Upper Abdomen: Contrast bolus timing is not optimized for evaluation of the abdominal organs. The visualized portions of the organs of the upper abdomen are normal. Musculoskeletal: No chest wall abnormality. No bony spinal canal stenosis. Review of the MIP images confirms the above findings. IMPRESSION: 1. No pulmonary embolus or acute aortic syndrome. 2. Cardiomegaly, small pleural  effusions and pulmonary edema, likely congestive heart failure. Aortic atherosclerosis (ICD10-I70.0). Electronically Signed   By: Ulyses Jarred M.D.   On: 02/27/2021 20:58        Scheduled Meds: . aspirin EC  81 mg Oral Daily  . enoxaparin (LOVENOX) injection  40 mg Subcutaneous Q24H  . ferrous sulfate  325 mg Oral BID WC  . furosemide  40 mg Intravenous Q12H  . levothyroxine  150 mcg Oral QAC breakfast  . loratadine  10 mg Oral Daily  . mometasone-formoterol  2 puff Inhalation BID  . nebivolol  20 mg Oral Daily  . olopatadine  1 drop Both Eyes BID  . rosuvastatin  10 mg Oral QHS  . sacubitril-valsartan  1 tablet Oral BID  . sodium chloride flush  3 mL Intravenous Q12H   Continuous Infusions: . sodium chloride       LOS: 1 day    Time spent: over 30 min    Fayrene Helper, MD [facility]   To contact the attending provider between 7A-7P or the covering provider during after hours 7P-7A, please log into the web site [email or web address] and access using universal [place] password for that web site. If you do not have the password,  please call the hospital operator.  02/28/2021, 3:31 PM

## 2021-02-28 NOTE — Progress Notes (Signed)
Progress Note  Patient Name: Bridget Lamb Date of Encounter: 02/28/2021  Attending physician: Elodia Florence., * Primary care provider: [name], PA-C Primary Cardiologist: Dr. Adrian Prows  Subjective: Bridget Lamb is a 65 y.o. female who was seen and examined at bedside at 2pm. No events overnight. Remains short of breath and requires supplemental oxygen at rest and ambulation.  No chest pain.  Case discussed and reviewed with her nurse.  Objective: Vital Signs in the last 24 hours: Temp:  [97.5 F (36.4 C)-98.2 F (36.8 C)] 98.2 F (36.8 C) (05/07 1318) Pulse Rate:  [57-72] 57 (05/07 1318) Resp:  [17-27] 18 (05/07 1318) BP: (107-149)/(48-107) 107/48 (05/07 1318) SpO2:  [92 %-100 %] 93 % (05/07 1318) Weight:  [110.7 kg-111.8 kg] 110.7 kg (05/07 0434)  Intake/Output:  Intake/Output Summary (Last 24 hours) at 02/28/2021 1427 Last data filed at 02/28/2021 0915 Gross per 24 hour  Intake 480 ml  Output 1750 ml  Net -1270 ml    Net IO Since Admission: -1,270 mL [02/28/21 1427]  Weights:  Filed Weights   02/27/21 2115 02/28/21 0434  Weight: 111.8 kg 110.7 kg    Telemetry: Personally reviewed : SB.   Physical examination: PHYSICAL EXAM: Vitals with BMI 02/28/2021 02/28/2021 02/28/2021  Height - - -  Weight - - 244 lbs 2 oz  BMI - - 02.63  Systolic 785 885 027  Diastolic 48 64 74  Pulse 57 59 63    CONSTITUTIONAL: Appears older than stated age, hemodynamically stable, No acute distress.  SKIN: Skin is warm and dry. No rash noted. No cyanosis. No pallor. No jaundice HEAD: Normocephalic and atraumatic.  EYES: No scleral icterus MOUTH/THROAT: Moist oral membranes.  NECK: JVD present. No thyromegaly noted. No carotid bruits  LYMPHATIC: No visible cervical adenopathy.  CHEST Normal respiratory effort. No intercostal retractions  LUNGS: Decreased air entry bilaterally with rales present over the left posterior chest wall, No wheezes.  CARDIOVASCULAR:  Regular, positive S1-S2, no murmurs rubs or gallops appreciated. ABDOMINAL: Obese, soft, nontender, nondistended, positive bowel sounds in all 4 quadrants no apparent ascites.  EXTREMITIES: +2 bilateral pitting edema up to the knees.  Warm to touch bilaterally.  Pulses present but faint HEMATOLOGIC: No significant bruising NEUROLOGIC: Oriented to person, place, and time. Nonfocal. Normal muscle tone.  PSYCHIATRIC: Normal mood and affect. Normal behavior. Cooperative  Lab Results: Hematology Recent Labs  Lab 02/27/21 0955 02/27/21 1822 02/28/21 0239  WBC 8.7 5.6  --   RBC 3.07* 2.99* 2.96*  HGB 9.6* 9.5*  --   HCT 31.1* 31.2*  --   MCV 101.3* 104.3*  --   MCH 31.3 31.8  --   MCHC 30.9 30.4  --   RDW 14.4 14.3  --   PLT 134* 120*  --     Chemistry Recent Labs  Lab 02/27/21 0955 02/27/21 1822 02/28/21 0239  NA 141  --  141  K 4.6  --  4.1  CL 105  --  103  CO2 29  --  27  GLUCOSE 123*  --  136*  BUN 25*  --  30*  CREATININE 1.23* 1.24* 1.17*  CALCIUM 9.4  --  9.2  GFRNONAA 49* 48* 52*  ANIONGAP 7  --  11     Cardiac Enzymes: Cardiac Panel (last 3 results) Recent Labs    02/27/21 0955 02/27/21 1214  TROPONINIHS 9 10    BNP (last 3 results) Recent Labs    05/20/20 0931  02/27/21 0956 02/28/21 0239  BNP 82.7 758.6* 873.8*    ProBNP (last 3 results) No results for input(s): PROBNP in the last 8760 hours.   DDimer  Recent Labs  Lab 02/27/21 1822  DDIMER 1.00*     Hemoglobin A1c: No results found for: HGBA1C, MPG  TSH No results for input(s): TSH in the last 8760 hours.  Lipid Panel     Component Value Date/Time   CHOL 117 02/28/2021 0239   TRIG 54 02/28/2021 0239   HDL 49 02/28/2021 0239   CHOLHDL 2.4 02/28/2021 0239   VLDL 11 02/28/2021 0239   LDLCALC 57 02/28/2021 0239    Imaging: DG Chest 2 View  Result Date: 02/27/2021 CLINICAL DATA:  sob and wheezing ,hx chf,asthmachf/sob EXAM: CHEST - 2 VIEW COMPARISON:  Radiograph 02/24/2020  FINDINGS: Stable enlarged cardiac silhouette. There is central venous congestion in mild peripheral interstitial markings increased from prior. Small effusions. No focal consolidation. Degenerative osteophytosis of the spine. IMPRESSION: Cardiomegaly central venous congestion, interstitial prominence, and small effusions suggest volume overload/congestive heart failure. Electronically Signed   By: Suzy Bouchard M.D.   On: 02/27/2021 10:19   CT Angio Chest PE W and/or Wo Contrast  Result Date: 02/27/2021 CLINICAL DATA:  Shortness of breath EXAM: CT ANGIOGRAPHY CHEST WITH CONTRAST TECHNIQUE: Multidetector CT imaging of the chest was performed using the standard protocol during bolus administration of intravenous contrast. Multiplanar CT image reconstructions and MIPs were obtained to evaluate the vascular anatomy. CONTRAST:  25mL OMNIPAQUE IOHEXOL 350 MG/ML SOLN COMPARISON:  None. FINDINGS: Cardiovascular: Contrast injection is sufficient to demonstrate satisfactory opacification of the pulmonary arteries to the segmental level. There is no pulmonary embolus or evidence of right heart strain. The size of the main pulmonary artery is normal. Heart is enlarged. The course and caliber of the aorta are normal. There is mild atherosclerotic calcification. Opacification decreased due to pulmonary arterial phase contrast bolus timing. Mediastinum/Nodes: No mediastinal, hilar or axillary lymphadenopathy. Normal visualized thyroid. Thoracic esophageal course is normal. Lungs/Pleura: Small pleural effusions with basilar atelectasis. Areas of ground-glass opacity within both lungs. Upper Abdomen: Contrast bolus timing is not optimized for evaluation of the abdominal organs. The visualized portions of the organs of the upper abdomen are normal. Musculoskeletal: No chest wall abnormality. No bony spinal canal stenosis. Review of the MIP images confirms the above findings. IMPRESSION: 1. No pulmonary embolus or acute aortic  syndrome. 2. Cardiomegaly, small pleural effusions and pulmonary edema, likely congestive heart failure. Aortic atherosclerosis (ICD10-I70.0). Electronically Signed   By: Ulyses Jarred M.D.   On: 02/27/2021 20:58    Cardiac database: EKG: 02/27/2021: Normal sinus rhythm, 77 bpm, normal axis, without underlying ischemia or injury pattern  Echocardiogram: 02/21/2020: 1. Left ventricular ejection fraction, by estimation, is 60 to 65%. The left ventricle has normal function. The left ventricle has no regional wall motion abnormalities. There is mild concentric left ventricular hypertrophy.  Left ventricular diastolic parameters are consistent with Grade II diastolic dysfunction (pseudonormalization).  Elevated left ventricular end-diastolic pressure.  2. Right ventricular systolic function is normal. The right ventricular size is normal. There is mildly elevated pulmonary artery systolic pressure.  3. Left atrial size was moderately dilated.  4. The mitral valve is normal in structure. Trivial mitral valve regurgitation. No evidence of mitral stenosis.  5. The aortic valve was not well visualized. Aortic valve regurgitation is not visualized. No aortic stenosis is present.  6. The inferior vena cava is dilated in size with <50% respiratory variability,  suggesting right atrial pressure of 15 mmHg.   Lexiscan Sestamibi stress test 03/10/2020: No previous exam available for comparison. Lexiscan nuclear stress test performed using 1-day protocol. Stress EKG is non-diagnostic, as this is pharmacological stress test. Normal myocardial perfusion. Stress LVEF 59%. Low risk study.   Scheduled Meds: . aspirin EC  81 mg Oral Daily  . enoxaparin (LOVENOX) injection  40 mg Subcutaneous Q24H  . ferrous sulfate  325 mg Oral BID WC  . furosemide  40 mg Intravenous Q12H  . levothyroxine  150 mcg Oral QAC breakfast  . loratadine  10 mg Oral Daily  . mometasone-formoterol  2 puff Inhalation BID  . nebivolol   20 mg Oral Daily  . olopatadine  1 drop Both Eyes BID  . rosuvastatin  10 mg Oral QHS  . sacubitril-valsartan  1 tablet Oral BID  . sodium chloride flush  3 mL Intravenous Q12H    Continuous Infusions: . sodium chloride      PRN Meds: sodium chloride, acetaminophen, albuterol, ondansetron (ZOFRAN) IV, senna-docusate, sodium chloride flush   IMPRESSION & RECOMMENDATIONS: Bridget Lamb is a 65 y.o. female whose past medical history and cardiac risk factors include: HFpEF, asthma, hypertension with chronic kidney disease, hyperlipidemia, cervical cancer, postmenopausal female, advanced age, obesity due to excess calories.  Acute heart failure with preserved EF, stage C, NYHA class III: Shortness of breath improving but requires oxygen supplementation.  Negative net urine output; but BNP uptrending Continue Lasix 40 mg IV push twice daily. Start Entresto 24/26 mg p.o. twice daily Discontinue amlodipine Continue home dose of beta-blocker therapy May consider addition of Zaroxolyn depending on renal function tomorrow as well as urine output. Recommend strict I's and O's  Shortness of breath: Improving D-dimers were elevated. CT PE protocol negative for PE. Anemia work-up initiated by primary team  Hypertension with chronic kidney disease stage III: Well-controlled, soft reading Discontinue amlodipine due to lower extremity swelling As discussed above initiated Entresto Continue to monitor  Hyperlipidemia: Most recent lipid profile from 02/28/2021 reviewed. LDL less than 70 mg/dL. Continue statin therapy.  Aortic atherosclerosis: Continue aspirin and statin therapy  Total time spent: 35 minutes.  Patient's questions and concerns were addressed to her satisfaction. She voices understanding of the instructions provided during this encounter.   This note was created using a voice recognition software as a result there may be grammatical errors inadvertently enclosed that do  not reflect the nature of this encounter. Every attempt is made to correct such errors.  Rex Kras, DO, [facility]. [place] Office: [phone] 02/28/2021, 2:27 PM

## 2021-02-28 NOTE — Plan of Care (Signed)

## 2021-02-28 NOTE — Evaluation (Signed)
Physical Therapy Evaluation Patient Details Name: Bridget Lamb MRN: [id number] DOB: [birth year] Today's Date: 02/28/2021   History of Present Illness  65 y.o. female [facility] ED on 02/27/2021 with worsening SOB and LE edema. Pt found to be hypoxic by EMS. Pt admitted for CHF exacerbation. PMH includes chronic diastolic heart failure, asthma, HLD, HTN, endometrial/cervical cancer.  Clinical Impression  Pt demonstrates ability to perform bed mobility and transfers without physical assistance. Pt tolerates ambulation of household distances without physical assistance, requiring multiple standing rest breaks due to increased WOB. Pt requires 3 L [place] with mobility and 2 L at rest to maintain oxygen sat levels. Pt will benefit from acute PT to address deficits in endurance, activity tolerance, energy conservation, and improvement in independent mobility. SPT recommends HHPT to implement a walking program and improve activity tolerance to return to prior level of function.     Follow Up Recommendations Home health PT    Equipment Recommendations   (rollator)    Recommendations for Other Services       Precautions / Restrictions Precautions Precautions: Fall Restrictions Weight Bearing Restrictions: No      Mobility  Bed Mobility Overal bed mobility: Modified Independent (HOB elevated)                  Transfers Overall transfer level: Independent Equipment used: None Transfers: Sit to/from Stand Sit to Stand: Independent            Ambulation/Gait Ambulation/Gait assistance: Min guard Gait Distance (Feet): 200 Feet Assistive device: None Gait Pattern/deviations: Decreased stride length;Step-through pattern (Pt relies upon railing in hallway due to increased work of breathing.) Gait velocity: reduced Gait velocity interpretation: 1.31 - 2.62 ft/sec, indicative of limited community ambulator General Gait Details: Pt requires multiple quick standing rest breaks  during gait. Pt on 1 L [place] and desats to 87-88%, returned to 2 L [place] and remains at 88--89%. Pt placed up to 3 L [place] during gait to maintain oxygen sats.  Stairs            Wheelchair Mobility    Modified Rankin (Stroke Patients Only)       Balance Overall balance assessment: Needs assistance Sitting-balance support: Feet supported;No upper extremity supported Sitting balance-Leahy Scale: Fair     Standing balance support: During functional activity;No upper extremity supported;Single extremity supported Standing balance-Leahy Scale: Fair Standing balance comment: Pt tolerates static standing without requiring UE support. Pt leans on wall/railing when fatigued.                             Pertinent Vitals/Pain Pain Assessment: No/denies pain    Home Living Family/patient expects to be discharged to:: Private residence Living Arrangements: Other relatives ([place] children) Available Help at Discharge: Family;Available 24 hours/day Type of Home: Other(Comment) ([facility]) Home Access: Stairs to enter Entrance Stairs-Rails: None Entrance Stairs-Number of Steps: 1 Home Layout: Two level Home Equipment: Cane - single point      Prior Function Level of Independence: Independent         Comments: Pt reports not using oxygen at baseline. 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     Hand Dominance        Extremity/Trunk Assessment   Upper Extremity Assessment Upper Extremity Assessment: Overall WFL for tasks assessed    Lower Extremity Assessment Lower Extremity Assessment: Overall WFL for tasks assessed       Communication  Communication: No difficulties  Cognition Arousal/Alertness: Awake/alert Behavior During Therapy: WFL for tasks assessed/performed Overall Cognitive Status: Within Functional Limits for tasks assessed                                        General Comments General comments (skin integrity, edema,  etc.): Upon PT arrival, pt on 2 L [place]. Pt weaned to 1L and desats. Pt is placed on 3 L [place] to maintain oxygen sat levels during mobility and returned to 2 L at end of session at rest.    Exercises     Assessment/Plan    PT Assessment Patient needs continued PT services  PT Problem List Decreased activity tolerance;Decreased balance;Decreased mobility;Decreased knowledge of use of DME;Cardiopulmonary status limiting activity       PT Treatment Interventions DME instruction;Gait training;Stair training;Functional mobility training;Therapeutic activities;Therapeutic exercise;Balance training;Patient/family education    PT Goals (Current goals can be found in the Care Plan section)  Acute Rehab PT Goals Patient Stated Goal: Be independent again and return home. PT Goal Formulation: With patient Time For Goal Achievement: 03/14/21 Potential to Achieve Goals: Good    Frequency Min 3X/week   Barriers to discharge        Co-evaluation               AM-PAC PT "6 Clicks" Mobility  Outcome Measure Help needed turning from your back to your side while in a flat bed without using bedrails?: None Help needed moving from lying on your back to sitting on the side of a flat bed without using bedrails?: None Help needed moving to and from a bed to a chair (including a wheelchair)?: None Help needed standing up from a chair using your arms (e.g., wheelchair or bedside chair)?: None Help needed to walk in hospital room?: A Little Help needed climbing 3-5 steps with a railing? : A Little 6 Click Score: 22    End of Session Equipment Utilized During Treatment: Gait belt;Oxygen Activity Tolerance: Patient limited by fatigue;Treatment limited secondary to medical complications (Comment) (oxygen sat levels and increaed WOB) Patient left: in chair;with call bell/phone within reach Nurse Communication: Mobility status PT Visit Diagnosis: Other abnormalities of gait and mobility  (R26.89);Unsteadiness on feet (R26.81)    Time: 1026-1050 PT Time Calculation (min) (ACUTE ONLY): 24 min   Charges:   PT Evaluation $PT Eval Low Complexity: 1 Low PT Treatments $Therapeutic Activity: 8-22 mins        Acute Rehab  Pager: [phone]   Garwin Brothers, SPT  02/28/2021, 1:34 PM

## 2021-02-28 NOTE — Progress Notes (Signed)
Pt. States she will notify if she decides to wear cpap.

## 2021-03-01 ENCOUNTER — Other Ambulatory Visit (HOSPITAL_COMMUNITY): Payer: BC Managed Care – PPO

## 2021-03-01 ENCOUNTER — Inpatient Hospital Stay (HOSPITAL_COMMUNITY): Payer: BC Managed Care – PPO

## 2021-03-01 DIAGNOSIS — I509 Heart failure, unspecified: Secondary | ICD-10-CM

## 2021-03-01 DIAGNOSIS — I129 Hypertensive chronic kidney disease with stage 1 through stage 4 chronic kidney disease, or unspecified chronic kidney disease: Secondary | ICD-10-CM

## 2021-03-01 DIAGNOSIS — I700 Atherosclerosis of aorta: Secondary | ICD-10-CM

## 2021-03-01 DIAGNOSIS — E782 Mixed hyperlipidemia: Secondary | ICD-10-CM

## 2021-03-01 DIAGNOSIS — N179 Acute kidney failure, unspecified: Secondary | ICD-10-CM

## 2021-03-01 LAB — COMPREHENSIVE METABOLIC PANEL
ALT: 16 U/L (ref 0–44)
AST: 18 U/L (ref 15–41)
Albumin: 3.3 g/dL — ABNORMAL LOW (ref 3.5–5.0)
Alkaline Phosphatase: 54 U/L (ref 38–126)
Anion gap: 7 (ref 5–15)
BUN: 47 mg/dL — ABNORMAL HIGH (ref 8–23)
CO2: 32 mmol/L (ref 22–32)
Calcium: 8.9 mg/dL (ref 8.9–10.3)
Chloride: 102 mmol/L (ref 98–111)
Creatinine, Ser: 1.51 mg/dL — ABNORMAL HIGH (ref 0.44–1.00)
GFR, Estimated: 38 mL/min — ABNORMAL LOW (ref >60)
Glucose, Bld: 91 mg/dL (ref 70–99)
Potassium: 3.5 mmol/L (ref 3.5–5.1)
Sodium: 141 mmol/L (ref 135–145)
Total Bilirubin: 0.8 mg/dL (ref 0.3–1.2)
Total Protein: 6.2 g/dL — ABNORMAL LOW (ref 6.5–8.1)

## 2021-03-01 LAB — CBC WITH DIFFERENTIAL/PLATELET
Abs Immature Granulocytes: 0.05 10*3/uL (ref 0.00–0.07)
Basophils Absolute: 0 10*3/uL (ref 0.0–0.1)
Basophils Relative: 0 %
Eosinophils Absolute: 0 10*3/uL (ref 0.0–0.5)
Eosinophils Relative: 0 %
HCT: 28.6 % — ABNORMAL LOW (ref 36.0–46.0)
Hemoglobin: 9.1 g/dL — ABNORMAL LOW (ref 12.0–15.0)
Immature Granulocytes: 1 %
Lymphocytes Relative: 6 %
Lymphs Abs: 0.6 10*3/uL — ABNORMAL LOW (ref 0.7–4.0)
MCH: 31.2 pg (ref 26.0–34.0)
MCHC: 31.8 g/dL (ref 30.0–36.0)
MCV: 97.9 fL (ref 80.0–100.0)
Monocytes Absolute: 0.6 10*3/uL (ref 0.1–1.0)
Monocytes Relative: 6 %
Neutro Abs: 8.6 10*3/uL — ABNORMAL HIGH (ref 1.7–7.7)
Neutrophils Relative %: 87 %
Platelets: 145 10*3/uL — ABNORMAL LOW (ref 150–400)
RBC: 2.92 MIL/uL — ABNORMAL LOW (ref 3.87–5.11)
RDW: 14.2 % (ref 11.5–15.5)
WBC: 9.8 10*3/uL (ref 4.0–10.5)
nRBC: 0.2 % (ref 0.0–0.2)

## 2021-03-01 LAB — MAGNESIUM: Magnesium: 2.1 mg/dL (ref 1.7–2.4)

## 2021-03-01 LAB — PHOSPHORUS: Phosphorus: 3.2 mg/dL (ref 2.5–4.6)

## 2021-03-01 MED ORDER — FUROSEMIDE 10 MG/ML IJ SOLN
40.0000 mg | Freq: Every day | INTRAMUSCULAR | Status: DC
  Administered 2021-03-01 – 2021-03-03 (×3): 40 mg via INTRAVENOUS
  Filled 2021-03-01 (×4): qty 4

## 2021-03-01 MED ORDER — FUROSEMIDE 10 MG/ML IJ SOLN: 40.0000 mg | Freq: Once | INTRAMUSCULAR | Status: DC

## 2021-03-01 MED ORDER — FUROSEMIDE 10 MG/ML IJ SOLN: 40.0000 mg | Freq: Two times a day (BID) | INTRAMUSCULAR | Status: DC

## 2021-03-01 NOTE — Progress Notes (Signed)
Progress Note  Patient Name: Bridget Lamb Date of Encounter: 03/01/2021  Attending physician: Elodia Florence., * Primary care provider: [name], PA-C Primary Cardiologist: Dr. Adrian Prows  Subjective: Bridget Lamb is a 65 y.o. female who was seen and examined at bedside at 3 pm. Denies chest pain or shortness of breath at rest. Still requiring 2 L nasal cannula at last and at least 3 L nasal cannula with ambulation per nursing staff. Case discussed and reviewed with her nurse.  Objective: Vital Signs in the last 24 hours: Temp:  [97.9 F (36.6 C)-98.4 F (36.9 C)] 98.3 F (36.8 C) (05/08 0832) Pulse Rate:  [58-61] 59 (05/08 0832) Resp:  [15-21] 17 (05/08 0442) BP: (98-122)/(42-58) 122/58 (05/08 0832) SpO2:  [93 %-97 %] 96 % (05/08 0832) Weight:  [109.5 kg] 109.5 kg (05/08 0442)  Intake/Output:  Intake/Output Summary (Last 24 hours) at 03/01/2021 1428 Last data filed at 03/01/2021 1259 Gross per 24 hour  Intake 1080 ml  Output 3300 ml  Net -2220 ml    Net IO Since Admission: -3,490 mL [03/01/21 1428]  Weights:  Filed Weights   02/27/21 2115 02/28/21 0434 03/01/21 0442  Weight: 111.8 kg 110.7 kg 109.5 kg    Telemetry: Personally reviewed : SB.   Physical examination: PHYSICAL EXAM: Vitals with BMI 03/01/2021 03/01/2021 03/01/2021  Height - - -  Weight - 241 lbs 6 oz -  BMI - 16.10 -  Systolic 960 454 98  Diastolic 58 42 46  Pulse 59 59 58    CONSTITUTIONAL: Appears older than stated age, hemodynamically stable, No acute distress.  SKIN: Skin is warm and dry. No rash noted. No cyanosis. No pallor. No jaundice HEAD: Normocephalic and atraumatic.  EYES: No scleral icterus MOUTH/THROAT: Moist oral membranes.  NECK: JVD present. No thyromegaly noted. No carotid bruits  LYMPHATIC: No visible cervical adenopathy.  CHEST Normal respiratory effort. No intercostal retractions  LUNGS: Decreased air entry bilaterally with rales present over the left  posterior chest wall, No wheezes.  CARDIOVASCULAR: Regular, positive S1-S2, no murmurs rubs or gallops appreciated. ABDOMINAL: Obese, soft, nontender, nondistended, positive bowel sounds in all 4 quadrants no apparent ascites.  EXTREMITIES: +1 bilateral pitting edema.  Warm to touch bilaterally.  Pulses present but faint HEMATOLOGIC: No significant bruising NEUROLOGIC: Oriented to person, place, and time. Nonfocal. Normal muscle tone.  PSYCHIATRIC: Normal mood and affect. Normal behavior. Cooperative  Lab Results: Hematology Recent Labs  Lab 02/27/21 0955 02/27/21 1822 02/28/21 0239 03/01/21 0149  WBC 8.7 5.6  --  9.8  RBC 3.07* 2.99* 2.96* 2.92*  HGB 9.6* 9.5*  --  9.1*  HCT 31.1* 31.2*  --  28.6*  MCV 101.3* 104.3*  --  97.9  MCH 31.3 31.8  --  31.2  MCHC 30.9 30.4  --  31.8  RDW 14.4 14.3  --  14.2  PLT 134* 120*  --  145*    Chemistry Recent Labs  Lab 02/27/21 0955 02/27/21 1822 02/28/21 0239 03/01/21 0149  NA 141  --  141 141  K 4.6  --  4.1 3.5  CL 105  --  103 102  CO2 29  --  27 32  GLUCOSE 123*  --  136* 91  BUN 25*  --  30* 47*  CREATININE 1.23* 1.24* 1.17* 1.51*  CALCIUM 9.4  --  9.2 8.9  PROT  --   --   --  6.2*  ALBUMIN  --   --   --  3.3*  AST  --   --   --  18  ALT  --   --   --  16  ALKPHOS  --   --   --  54  BILITOT  --   --   --  0.8  GFRNONAA 49* 48* 52* 38*  ANIONGAP 7  --  11 7     Cardiac Enzymes: Cardiac Panel (last 3 results) Recent Labs    02/27/21 0955 02/27/21 1214  TROPONINIHS 9 10    BNP (last 3 results) Recent Labs    05/20/20 0931 02/27/21 0956 02/28/21 0239  BNP 82.7 758.6* 873.8*    ProBNP (last 3 results) No results for input(s): PROBNP in the last 8760 hours.   DDimer  Recent Labs  Lab 02/27/21 1822  DDIMER 1.00*     Hemoglobin A1c:  Lab Results  Component Value Date   HGBA1C 5.4 02/28/2021   MPG 108.28 02/28/2021    TSH No results for input(s): TSH in the last 8760 hours.  Lipid Panel      Component Value Date/Time   CHOL 117 02/28/2021 0239   TRIG 54 02/28/2021 0239   HDL 49 02/28/2021 0239   CHOLHDL 2.4 02/28/2021 0239   VLDL 11 02/28/2021 0239   LDLCALC 57 02/28/2021 0239    Imaging: CT Angio Chest PE W and/or Wo Contrast  Result Date: 02/27/2021 CLINICAL DATA:  Shortness of breath EXAM: CT ANGIOGRAPHY CHEST WITH CONTRAST TECHNIQUE: Multidetector CT imaging of the chest was performed using the standard protocol during bolus administration of intravenous contrast. Multiplanar CT image reconstructions and MIPs were obtained to evaluate the vascular anatomy. CONTRAST:  22mL OMNIPAQUE IOHEXOL 350 MG/ML SOLN COMPARISON:  None. FINDINGS: Cardiovascular: Contrast injection is sufficient to demonstrate satisfactory opacification of the pulmonary arteries to the segmental level. There is no pulmonary embolus or evidence of right heart strain. The size of the main pulmonary artery is normal. Heart is enlarged. The course and caliber of the aorta are normal. There is mild atherosclerotic calcification. Opacification decreased due to pulmonary arterial phase contrast bolus timing. Mediastinum/Nodes: No mediastinal, hilar or axillary lymphadenopathy. Normal visualized thyroid. Thoracic esophageal course is normal. Lungs/Pleura: Small pleural effusions with basilar atelectasis. Areas of ground-glass opacity within both lungs. Upper Abdomen: Contrast bolus timing is not optimized for evaluation of the abdominal organs. The visualized portions of the organs of the upper abdomen are normal. Musculoskeletal: No chest wall abnormality. No bony spinal canal stenosis. Review of the MIP images confirms the above findings. IMPRESSION: 1. No pulmonary embolus or acute aortic syndrome. 2. Cardiomegaly, small pleural effusions and pulmonary edema, likely congestive heart failure. Aortic atherosclerosis (ICD10-I70.0). Electronically Signed   By: Ulyses Jarred M.D.   On: 02/27/2021 20:58    Cardiac  database: EKG: 02/27/2021: Normal sinus rhythm, 77 bpm, normal axis, without underlying ischemia or injury pattern  Echocardiogram: 02/21/2020: 1. Left ventricular ejection fraction, by estimation, is 60 to 65%. The left ventricle has normal function. The left ventricle has no regional wall motion abnormalities. There is mild concentric left ventricular hypertrophy.  Left ventricular diastolic parameters are consistent with Grade II diastolic dysfunction (pseudonormalization).  Elevated left ventricular end-diastolic pressure.  2. Right ventricular systolic function is normal. The right ventricular size is normal. There is mildly elevated pulmonary artery systolic pressure.  3. Left atrial size was moderately dilated.  4. The mitral valve is normal in structure. Trivial mitral valve regurgitation. No evidence of mitral stenosis.  5. The aortic  valve was not well visualized. Aortic valve regurgitation is not visualized. No aortic stenosis is present.  6. The inferior vena cava is dilated in size with <50% respiratory variability, suggesting right atrial pressure of 15 mmHg.   Lexiscan Sestamibi stress test 03/10/2020: No previous exam available for comparison. Lexiscan nuclear stress test performed using 1-day protocol. Stress EKG is non-diagnostic, as this is pharmacological stress test. Normal myocardial perfusion. Stress LVEF 59%. Low risk study.   Scheduled Meds: . aspirin EC  81 mg Oral Daily  . enoxaparin (LOVENOX) injection  40 mg Subcutaneous Q24H  . ferrous sulfate  325 mg Oral BID WC  . furosemide  40 mg Intravenous Daily  . levothyroxine  150 mcg Oral QAC breakfast  . loratadine  10 mg Oral Daily  . mometasone-formoterol  2 puff Inhalation BID  . nebivolol  20 mg Oral Daily  . olopatadine  1 drop Both Eyes BID  . rosuvastatin  10 mg Oral QHS  . sodium chloride flush  3 mL Intravenous Q12H    Continuous Infusions: . sodium chloride      PRN Meds: sodium chloride,  acetaminophen, albuterol, ondansetron (ZOFRAN) IV, senna-docusate, sodium chloride flush   IMPRESSION & RECOMMENDATIONS: Jenah Vanasten Loeper is a 65 y.o. female whose past medical history and cardiac risk factors include: HFpEF, asthma, hypertension with chronic kidney disease, hyperlipidemia, cervical cancer, postmenopausal female, advanced age, obesity due to excess calories.  Acute heart failure with preserved EF, stage C, NYHA class III: Shortness of breath improving but requires oxygen supplementation.  Negative net urine output Check BNP in the morning Decrease Lasix 40 mg IV push daily. Hold Entresto 24/26 mg p.o. twice daily Discontinue amlodipine Continue home dose of beta-blocker therapy Recommend strict I's and O's Compression stockings. Elevate the legs while in bed. Echocardiogram pending  Shortness of breath: Improving D-dimers were elevated. CT PE protocol negative for PE. Anemia work-up initiated by primary team Incentive spirometry at bedside  Acute kidney injury: Most likely secondary to diuresis, soft blood pressures, and the initiation of Arni Will decrease Lasix as discussed above. Hold Entresto for now.  Hypertension with chronic kidney disease stage III: Well-controlled, soft reading Discontinue amlodipine due to lower extremity swelling Entresto held secondary to acute kidney injury Continue to monitor  Hyperlipidemia: Most recent lipid profile from 02/28/2021 reviewed. LDL less than 70 mg/dL. Continue statin therapy.  Aortic atherosclerosis: Continue aspirin and statin therapy  Medical management discussed with both nursing staff at bedside as well as primary team.  Total time spent: 37 minutes.  Patient's questions and concerns were addressed to her satisfaction. She voices understanding of the instructions provided during this encounter.   This note was created using a voice recognition software as a result there may be grammatical errors  inadvertently enclosed that do not reflect the nature of this encounter. Every attempt is made to correct such errors.  Rex Kras, DO, [facility]. [place] Office: [phone] 03/01/2021, 2:28 PM

## 2021-03-01 NOTE — Progress Notes (Signed)
PROGRESS NOTE    Bridget Lamb  [id number] DOB: November 11, 1955 DOA: 02/27/2021 PCP: [name], PA-C   No chief complaint on file.  Brief Narrative:  Bridget Lamb is Bridget Lamb 65 y.o. female with medical history significant of chronic diastolic heart failure, asthma, HLD, HTN, endometrial/cervical cancer-who presented to the ED for progressively worsening shortness of breath.  Per patient-she does have some amount of chronic shortness of breath at baseline, however for the past week or so-she has had progressively worsening shortness of breath.  Her shortness of breath is mostly with activity.  She also has worsening lower extremity edema.  When she checked her pulse ox at home and it was around 60/70's-EMS was called-she was subsequently brought to the emergency room-when she first arrived-she was on 6 L of oxygen to maintain O2 saturations.Patient also acknowledges intermittent exertional chest pain as well.  Per patient-she is taking her torsemide only once Bridget Lamb day when she is supposed to be taking twice Bridget Lamb day, since she was about to run out of it-she only started taking it once Bridget Lamb day.  She recently has been traveling and has had dietary indiscretion as well.  No history of nausea, vomiting or diarrhea.  Assessment & Plan:   Active Problems:   Acute on chronic diastolic (congestive) heart failure (HCC)   Hypoxia  Acute hypoxic respiratory failure due to HFpEF with exacerbation:  Likely precipitated by dietary/medication noncompliance CT PE protocol at presentation without PE, notable for small effusions and pulm edema, likely CHF Currently on 2 L, gradually improving Continue lasix daily (with creatinine bump) Entresto (hold with bump in creatinine) Cardiology c/s, appreciate recs Echo pending Strict I/O, daily weights  Acute Kidney Injury Baseline ~1.2 Bumped to 1.51 today In setting of diuresis, entresto Still appears overloaded - hold entresto, lasix once daily for  now, follow closely  Chest pain: Likely atypical-EKG/troponins negative.  HTN: Resume amlodipine/nebivolol-follow and adjust as needed.  HLD Statin  Anemia: labs c/w AOCD, iron borderline low as well, likely component of iron def - follow outpatient w/u additionally as indicated.  Retics hypoproliferative.  Bronchial asthma: Continue usual bronchodilator regimen  Hypothyroidism: Continue Synthroid  History of endometrial adenocarcinoma: Stable for outpatient follow-up with radiation oncology/GYN oncology.  Currently under observation.  OSA: CPAP nightly   DVT prophylaxis: lovenox Code Status: full  Family Communication: none at bedside Disposition:   Status is: Inpatient  Remains inpatient appropriate because:Inpatient level of care appropriate due to severity of illness   Dispo: The patient is from: Home              Anticipated d/c is to: Home              Patient currently is not medically stable to d/c.   Difficult to place patient No       Consultants:   cardiology  Procedures: Echo pending  Antimicrobials: Anti-infectives (From admission, onward)   None         Subjective: No new complaints Hasn't been up and about yet  Objective: Vitals:   03/01/21 0031 03/01/21 0442 03/01/21 0829 03/01/21 0832  BP: (!) 98/46 (!) 104/42  (!) 122/58  Pulse: (!) 58 (!) 59  (!) 59  Resp: 15 17    Temp: 97.9 F (36.6 C) 98.3 F (36.8 C)  98.3 F (36.8 C)  TempSrc: Oral Oral  Oral  SpO2: 94% 95% 97% 96%  Weight:  109.5 kg    Height:  Intake/Output Summary (Last 24 hours) at 03/01/2021 1306 Last data filed at 03/01/2021 1259 Gross per 24 hour  Intake 1080 ml  Output 3300 ml  Net -2220 ml   Filed Weights   02/27/21 2115 02/28/21 0434 03/01/21 0442  Weight: 111.8 kg 110.7 kg 109.5 kg    Examination:  General: No acute distress. Cardiovascular: Heart sounds show Bridget Lamb regular rate, and rhythm. + JVD Lungs: Clear to auscultation  bilaterally Abdomen: Soft, nontender, nondistended  Neurological: Alert and oriented 3. Moves all extremities 4 . Cranial nerves II through XII grossly intact. Skin: Warm and dry. No rashes or lesions. Extremities: 1+ bilateral LE edema  Data Reviewed: I have personally reviewed following labs and imaging studies  CBC: Recent Labs  Lab 02/27/21 0955 02/27/21 1822 03/01/21 0149  WBC 8.7 5.6 9.8  NEUTROABS  --   --  8.6*  HGB 9.6* 9.5* 9.1*  HCT 31.1* 31.2* 28.6*  MCV 101.3* 104.3* 97.9  PLT 134* 120* 145*    Basic Metabolic Panel: Recent Labs  Lab 02/27/21 0955 02/27/21 1822 02/28/21 0239 03/01/21 0149  NA 141  --  141 141  K 4.6  --  4.1 3.5  CL 105  --  103 102  CO2 29  --  27 32  GLUCOSE 123*  --  136* 91  BUN 25*  --  30* 47*  CREATININE 1.23* 1.24* 1.17* 1.51*  CALCIUM 9.4  --  9.2 8.9  MG  --   --   --  2.1  PHOS  --   --   --  3.2    GFR: Estimated Creatinine Clearance: 44.9 mL/min (Bridget Lamb) (by C-G formula based on SCr of 1.51 mg/dL (H)).  Liver Function Tests: Recent Labs  Lab 03/01/21 0149  AST 18  ALT 16  ALKPHOS 54  BILITOT 0.8  PROT 6.2*  ALBUMIN 3.3*    CBG: No results for input(s): GLUCAP in the last 168 hours.   Recent Results (from the past 240 hour(s))  Resp Panel by RT-PCR (Flu Bridget Lamb&B, Covid) Nasopharyngeal Swab     Status: None   Collection Time: 02/27/21  6:24 PM   Specimen: Nasopharyngeal Swab; Nasopharyngeal(NP) swabs in vial transport medium  Result Value Ref Range Status   SARS Coronavirus 2 by RT PCR NEGATIVE NEGATIVE Final    Comment: (NOTE) SARS-CoV-2 target nucleic acids are NOT DETECTED.  The SARS-CoV-2 RNA is generally detectable in upper respiratory specimens during the acute phase of infection. The lowest concentration of SARS-CoV-2 viral copies this assay can detect is 138 copies/mL. Bridget Lamb negative result does not preclude SARS-Cov-2 infection and should not be used as the sole basis for treatment or other patient  management decisions. Bridget Lamb negative result may occur with  improper specimen collection/handling, submission of specimen other than nasopharyngeal swab, presence of viral mutation(s) within the areas targeted by this assay, and inadequate number of viral copies(<138 copies/mL). Taniqua Issa negative result must be combined with clinical observations, patient history, and epidemiological information. The expected result is Negative.  Fact Sheet for Patients:  EntrepreneurPulse.com.au  Fact Sheet for Healthcare Providers:  IncredibleEmployment.be  This test is no t yet approved or cleared by the [place] FDA and  has been authorized for detection and/or diagnosis of SARS-CoV-2 by FDA under an Emergency Use Authorization (EUA). This EUA will remain  in effect (meaning this test can be used) for the duration of the COVID-19 declaration under Section 564(b)(1) of the Act, 21 U.S.C.section 360bbb-3(b)(1), unless the authorization is  terminated  or revoked sooner.       Influenza Jonothan Heberle by PCR NEGATIVE NEGATIVE Final   Influenza B by PCR NEGATIVE NEGATIVE Final    Comment: (NOTE) The Xpert Xpress SARS-CoV-2/FLU/RSV plus assay is intended as an aid in the diagnosis of influenza from Nasopharyngeal swab specimens and should not be used as Winner Valeriano sole basis for treatment. Nasal washings and aspirates are unacceptable for Xpert Xpress SARS-CoV-2/FLU/RSV testing.  Fact Sheet for Patients: EntrepreneurPulse.com.au  Fact Sheet for Healthcare Providers: IncredibleEmployment.be  This test is not yet approved or cleared by the [place] FDA and has been authorized for detection and/or diagnosis of SARS-CoV-2 by FDA under an Emergency Use Authorization (EUA). This EUA will remain in effect (meaning this test can be used) for the duration of the COVID-19 declaration under Section 564(b)(1) of the Act, 21 U.S.C. section 360bbb-3(b)(1),  unless the authorization is terminated or revoked.  Performed at [facility], [place] [address], [place], [place] [address]          Radiology Studies: CT Angio Chest PE W and/or Wo Contrast  Result Date: 02/27/2021 CLINICAL DATA:  Shortness of breath EXAM: CT ANGIOGRAPHY CHEST WITH CONTRAST TECHNIQUE: Multidetector CT imaging of the chest was performed using the standard protocol during bolus administration of intravenous contrast. Multiplanar CT image reconstructions and MIPs were obtained to evaluate the vascular anatomy. CONTRAST:  62mL OMNIPAQUE IOHEXOL 350 MG/ML SOLN COMPARISON:  None. FINDINGS: Cardiovascular: Contrast injection is sufficient to demonstrate satisfactory opacification of the pulmonary arteries to the segmental level. There is no pulmonary embolus or evidence of right heart strain. The size of the main pulmonary artery is normal. Heart is enlarged. The course and caliber of the aorta are normal. There is mild atherosclerotic calcification. Opacification decreased due to pulmonary arterial phase contrast bolus timing. Mediastinum/Nodes: No mediastinal, hilar or axillary lymphadenopathy. Normal visualized thyroid. Thoracic esophageal course is normal. Lungs/Pleura: Small pleural effusions with basilar atelectasis. Areas of ground-glass opacity within both lungs. Upper Abdomen: Contrast bolus timing is not optimized for evaluation of the abdominal organs. The visualized portions of the organs of the upper abdomen are normal. Musculoskeletal: No chest wall abnormality. No bony spinal canal stenosis. Review of the MIP images confirms the above findings. IMPRESSION: 1. No pulmonary embolus or acute aortic syndrome. 2. Cardiomegaly, small pleural effusions and pulmonary edema, likely congestive heart failure. Aortic atherosclerosis (ICD10-I70.0). Electronically Signed   By: Ulyses Jarred M.D.   On: 02/27/2021 20:58        Scheduled Meds: . aspirin EC  81 mg Oral Daily  .  enoxaparin (LOVENOX) injection  40 mg Subcutaneous Q24H  . ferrous sulfate  325 mg Oral BID WC  . furosemide  40 mg Intravenous Daily  . levothyroxine  150 mcg Oral QAC breakfast  . loratadine  10 mg Oral Daily  . mometasone-formoterol  2 puff Inhalation BID  . nebivolol  20 mg Oral Daily  . olopatadine  1 drop Both Eyes BID  . rosuvastatin  10 mg Oral QHS  . sodium chloride flush  3 mL Intravenous Q12H   Continuous Infusions: . sodium chloride       LOS: 2 days    Time spent: over 30 min    Fayrene Helper, MD [facility]   To contact the attending provider between 7A-7P or the covering provider during after hours 7P-7A, please log into the web site [email or web address] and access using universal [place] password for that web site. If you  do not have the password, please call the hospital operator.  03/01/2021, 1:06 PM

## 2021-03-01 NOTE — Plan of Care (Signed)

## 2021-03-01 NOTE — Progress Notes (Signed)
  Echocardiogram 2D Echocardiogram has been performed.  Bridget Lamb 03/01/2021, 5:57 PM

## 2021-03-01 NOTE — Progress Notes (Signed)
Patient refused CPAP tonight 

## 2021-03-02 LAB — COMPREHENSIVE METABOLIC PANEL
ALT: 17 U/L (ref 0–44)
AST: 18 U/L (ref 15–41)
Albumin: 3.1 g/dL — ABNORMAL LOW (ref 3.5–5.0)
Alkaline Phosphatase: 62 U/L (ref 38–126)
Anion gap: 6 (ref 5–15)
BUN: 36 mg/dL — ABNORMAL HIGH (ref 8–23)
CO2: 32 mmol/L (ref 22–32)
Calcium: 8.7 mg/dL — ABNORMAL LOW (ref 8.9–10.3)
Chloride: 103 mmol/L (ref 98–111)
Creatinine, Ser: 1.33 mg/dL — ABNORMAL HIGH (ref 0.44–1.00)
GFR, Estimated: 44 mL/min — ABNORMAL LOW (ref >60)
Glucose, Bld: 129 mg/dL — ABNORMAL HIGH (ref 70–99)
Potassium: 3.6 mmol/L (ref 3.5–5.1)
Sodium: 141 mmol/L (ref 135–145)
Total Bilirubin: 0.4 mg/dL (ref 0.3–1.2)
Total Protein: 5.9 g/dL — ABNORMAL LOW (ref 6.5–8.1)

## 2021-03-02 LAB — CBC WITH DIFFERENTIAL/PLATELET
Abs Immature Granulocytes: 0.03 10*3/uL (ref 0.00–0.07)
Basophils Absolute: 0 10*3/uL (ref 0.0–0.1)
Basophils Relative: 0 %
Eosinophils Absolute: 0.1 10*3/uL (ref 0.0–0.5)
Eosinophils Relative: 2 %
HCT: 29.5 % — ABNORMAL LOW (ref 36.0–46.0)
Hemoglobin: 9.3 g/dL — ABNORMAL LOW (ref 12.0–15.0)
Immature Granulocytes: 1 %
Lymphocytes Relative: 8 %
Lymphs Abs: 0.4 10*3/uL — ABNORMAL LOW (ref 0.7–4.0)
MCH: 31.3 pg (ref 26.0–34.0)
MCHC: 31.5 g/dL (ref 30.0–36.0)
MCV: 99.3 fL (ref 80.0–100.0)
Monocytes Absolute: 0.2 10*3/uL (ref 0.1–1.0)
Monocytes Relative: 4 %
Neutro Abs: 4.6 10*3/uL (ref 1.7–7.7)
Neutrophils Relative %: 85 %
Platelets: 164 10*3/uL (ref 150–400)
RBC: 2.97 MIL/uL — ABNORMAL LOW (ref 3.87–5.11)
RDW: 14.2 % (ref 11.5–15.5)
WBC: 5.4 10*3/uL (ref 4.0–10.5)
nRBC: 0 % (ref 0.0–0.2)

## 2021-03-02 LAB — ECHOCARDIOGRAM COMPLETE
AR max vel: 1.66 cm2
AV Area VTI: 1.69 cm2
AV Area mean vel: 1.63 cm2
AV Mean grad: 10 mmHg
AV Peak grad: 18.5 mmHg
Ao pk vel: 2.15 m/s
Area-P 1/2: 3.85 cm2
Height: 64 in
S' Lateral: 3.4 cm
Weight: 3862.4 oz

## 2021-03-02 LAB — MAGNESIUM: Magnesium: 2.2 mg/dL (ref 1.7–2.4)

## 2021-03-02 MED ORDER — SODIUM CHLORIDE 0.9 % IV SOLN
510.0000 mg | Freq: Once | INTRAVENOUS | Status: AC
  Administered 2021-03-02: 510 mg via INTRAVENOUS
  Filled 2021-03-02: qty 17

## 2021-03-02 MED ORDER — ENOXAPARIN SODIUM 60 MG/0.6ML IJ SOSY
50.0000 mg | PREFILLED_SYRINGE | INTRAMUSCULAR | Status: DC
  Administered 2021-03-02: 50 mg via SUBCUTANEOUS
  Filled 2021-03-02: qty 0.6

## 2021-03-02 MED ORDER — DAPAGLIFLOZIN PROPANEDIOL 5 MG PO TABS
5.0000 mg | ORAL_TABLET | Freq: Every day | ORAL | Status: DC
  Administered 2021-03-02 – 2021-03-03 (×2): 5 mg via ORAL
  Filled 2021-03-02 (×2): qty 1

## 2021-03-02 NOTE — Progress Notes (Signed)
SATURATION QUALIFICATIONS: (This note is used to comply with regulatory documentation for home oxygen)  Patient Saturations on Room Air at Rest =86%  Patient Saturations on Room Air while Ambulating = 84%  Patient Saturations on2Liters of oxygen while Ambulating =97%  Please briefly explain why patient needs home oxygen:Also patient have shortness of breath while ambulating on room air.

## 2021-03-02 NOTE — Progress Notes (Signed)
Subjective:  Still has shortness of breath but feels much better.  Is down by 4 pounds in weight.  Intake/Output from previous day:  I/O last 3 completed shifts: In: 1200 [P.O.:1200] Out: 4900 [Urine:4900] Total I/O In: 240 [P.O.:240] Out: 100 [Urine:100]  Blood pressure (!) 115/48, pulse (!) 55, temperature 98.3 F (36.8 C), temperature source Oral, resp. rate 18, height $RemoveBe'5\' 4"'ssFJgruIl$  (1.626 m), weight 107.8 kg, last menstrual period 10/22/2011, SpO2 97 %.   Intake/Output Summary (Last 24 hours) at 03/02/2021 1207 Last data filed at 03/02/2021 0800 Gross per 24 hour  Intake 960 ml  Output 2200 ml  Net -1240 ml      Physical Exam Constitutional:      General: She is not in acute distress.    Appearance: She is obese. She is ill-appearing.  HENT:     Mouth/Throat:     Mouth: Mucous membranes are moist.  Eyes:     Extraocular Movements: Extraocular movements intact.  Neck:     Vascular: JVD present. No carotid bruit.  Cardiovascular:     Rate and Rhythm: Normal rate and regular rhythm.     Pulses: Intact distal pulses.     Heart sounds: Normal heart sounds. No murmur heard. No gallop.   Pulmonary:     Effort: Pulmonary effort is normal.     Breath sounds: Wheezing (scattered) and rales (bibasilar) present.  Abdominal:     General: Bowel sounds are normal.     Palpations: Abdomen is soft.     Comments: Obese  Musculoskeletal:        General: Swelling (2 + bilateral pitting edema) present. Normal range of motion.     Cervical back: Normal range of motion.  Skin:    Capillary Refill: Capillary refill takes less than 2 seconds.  Neurological:     General: No focal deficit present.    Lab Results: BMP BNP (last 3 results) Recent Labs    05/20/20 0931 02/27/21 0956 02/28/21 0239  BNP 82.7 758.6* 873.8*    No results for input(s): PROBNP in the last 8760 hours. BMP Latest Ref Rng & Units 03/02/2021 03/01/2021 02/28/2021  Glucose 70 - 99 mg/dL 129(H) 91 136(H)  BUN 8 - 23  mg/dL 36(H) 47(H) 30(H)  Creatinine 0.44 - 1.00 mg/dL 1.33(H) 1.51(H) 1.17(H)  BUN/Creat Ratio 12 - 28 - - -  Sodium 135 - 145 mmol/L 141 141 141  Potassium 3.5 - 5.1 mmol/L 3.6 3.5 4.1  Chloride 98 - 111 mmol/L 103 102 103  CO2 22 - 32 mmol/L 32 32 27  Calcium 8.9 - 10.3 mg/dL 8.7(L) 8.9 9.2   Hepatic Function Latest Ref Rng & Units 03/02/2021 03/01/2021 02/26/2020  Total Protein 6.5 - 8.1 g/dL 5.9(L) 6.2(L) -  Albumin 3.5 - 5.0 g/dL 3.1(L) 3.3(L) 3.5  AST 15 - 41 U/L 18 18 -  ALT 0 - 44 U/L 17 16 -  Alk Phosphatase 38 - 126 U/L 62 54 -  Total Bilirubin 0.3 - 1.2 mg/dL 0.4 0.8 -   CBC Latest Ref Rng & Units 03/02/2021 03/01/2021 02/27/2021  WBC 4.0 - 10.5 K/uL 5.4 9.8 5.6  Hemoglobin 12.0 - 15.0 g/dL 9.3(L) 9.1(L) 9.5(L)  Hematocrit 36.0 - 46.0 % 29.5(L) 28.6(L) 31.2(L)  Platelets 150 - 400 K/uL 164 145(L) 120(L)   Lipid Panel     Component Value Date/Time   CHOL 117 02/28/2021 0239   TRIG 54 02/28/2021 0239   HDL 49 02/28/2021 0239   CHOLHDL 2.4 02/28/2021  0239   VLDL 11 02/28/2021 0239   LDLCALC 57 02/28/2021 0239   Cardiac Panel (last 3 results) No results for input(s): CKTOTAL, CKMB, TROPONINI, RELINDX in the last 72 hours.  HEMOGLOBIN A1C Lab Results  Component Value Date   HGBA1C 5.4 02/28/2021   MPG 108.28 02/28/2021   TSH No results for input(s): TSH in the last 8760 hours. Imaging:  CT angiogram chest 02/27/2021: Lungs/Pleura: Small pleural effusions with basilar atelectasis. Areas of ground-glass opacity within both lungs. No PE. Cardiomegaly, small pleural effusions and pulmonary edema, likely congestive heart failure.  Chest x-ray PA and lateral view 02/27/2021: Cardiomegaly central venous congestion, interstitial prominence, and small effusions suggest volume overload/congestive heart failure.   Cardiac Studies:  Echocardiogram: 02/21/2020: 1. Left ventricular ejection fraction, by estimation, is 60 to 65%. The left ventricle has normal function. The left  ventricle has no regional wall motion abnormalities. There is mild concentric left ventricular hypertrophy.  Left ventricular diastolic parameters are consistent with Grade II diastolic dysfunction (pseudonormalization).  Elevated left ventricular end-diastolic pressure.  2. Right ventricular systolic function is normal. The right ventricular size is normal. There is mildly elevated pulmonary artery systolic pressure.  3. Left atrial size was moderately dilated.  4. The mitral valve is normal in structure. Trivial mitral valve regurgitation. No evidence of mitral stenosis.  5. The aortic valve was not well visualized. Aortic valve regurgitation is not visualized. No aortic stenosis is present.  6. The inferior vena cava is dilated in size with <50% respiratory variability, suggesting right atrial pressure of 15 mmHg.   Lexiscan Sestamibi stress test 03/10/2020: No previous exam available for comparison. Lexiscan nuclear stress test performed using 1-day protocol. Stress EKG is non-diagnostic, as this is pharmacological stress test. Normal myocardial perfusion. Stress LVEF 59%. Low risk study.  Echocardiogram 03/02/2021:    1. Left ventricular ejection fraction, by estimation, is 55 to 60%. The left ventricle has normal function. Left ventricular endocardial border not optimally defined to evaluate regional wall motion. There is mild left ventricular hypertrophy. Left  ventricular diastolic parameters are consistent with Grade II diastolic dysfunction (pseudonormalization). Elevated left ventricular end-diastolic pressure.  2. Right ventricular systolic function is normal. The right ventricular size is mildly enlarged. Mildly increased right ventricular wall thickness. There is moderately elevated pulmonary artery systolic pressure.  3. Left atrial size was moderately dilated.  4. Right atrial size was mildly dilated.  5. The mitral valve is normal in structure. Mild to moderate mitral valve  regurgitation.  6. Tricuspid valve regurgitation is mild to moderate.  7. The aortic valve is normal in structure. Aortic valve regurgitation is not visualized. No aortic stenosis is present.  8. The inferior vena cava is dilated in size with <50% respiratory variability, suggesting right atrial pressure of 15 mmHg.   EKG: 02/27/2021: Normal sinus rhythm, 77 bpm, normal axis, without underlying ischemia or injury pattern   Scheduled Meds: . aspirin EC  81 mg Oral Daily  . enoxaparin (LOVENOX) injection  40 mg Subcutaneous Q24H  . ferrous sulfate  325 mg Oral BID WC  . furosemide  40 mg Intravenous Daily  . levothyroxine  150 mcg Oral QAC breakfast  . loratadine  10 mg Oral Daily  . mometasone-formoterol  2 puff Inhalation BID  . nebivolol  20 mg Oral Daily  . olopatadine  1 drop Both Eyes BID  . rosuvastatin  10 mg Oral QHS  . sodium chloride flush  3 mL Intravenous Q12H   Continuous Infusions: .  sodium chloride     PRN Meds:.sodium chloride, acetaminophen, albuterol, ondansetron (ZOFRAN) IV, senna-docusate, sodium chloride flush  Assessment/Plan:   Bridget Lamb is a 65 y.o. Caucasian female patient with morbid obesity BMI 44, hypertension, restrictive airway disease due to bronchial asthma, hyperlipidemia, HFpEF, OSA and not using CPAP due to feeling claustrophobia. Now admitted with acute decompensated diastolic heart failure.  1.  Acute on chronic diastolic heart failure 2.  Morbid obesity due to excess calories, BMI 40-45 range, with significant serious comorbidity, BMI 40+. 3.  Restrictive airway disease due to bronchial asthma 4.  Obstructive sleep apnea not on CPAP contributing to recurrence of heart failure 5.  Stage IIIa chronic kidney disease 6.  Chronic cor pulmonale and moderate pulmonary hypertension related to #1 and #3 7.  Iron deficiency anemia  Recommendation: Patient still has fluid overload state.  Continue IV Lasix today, I will add Farxiga 10 mg daily  for heart failure and hopefully this will help with weight loss as well.  Monitor her renal function on Farxiga and diuretics. Unable to use CPAP, she still has significant restriction in air entry.  Continue oxygen.  She may need home oxygen therapy.  Will evaluate for the same. Will give iron transfusion in view of severe iron deficiency and also underlying CHF.     Adrian Prows, MD, [facility] 03/02/2021, 11:58 AM Office: [phone] Pager: [phone]

## 2021-03-02 NOTE — Progress Notes (Signed)
Physical Therapy Treatment Note  SATURATION QUALIFICATIONS: (This note is used to comply with regulatory documentation for home oxygen)  Patient Saturations on Room Air at Rest = 92%  Patient Saturations on Room Air while Ambulating = 85%  Patient Saturations on 2 Liters of oxygen while Ambulating = 94%  Please briefly explain why patient needs home oxygen: Patient requires supplemental oxygen to maintain oxygen saturations at acceptable, safe levels with physical activity.  (Full PT treatment note to follow)  Roney Marion, [place] Pager [phone] Office [phone]

## 2021-03-02 NOTE — Progress Notes (Addendum)
Physical Therapy Treatment Patient Details Name: Bridget Lamb MRN: [id number] DOB: [birth year] Today's Date: 03/02/2021    History of Present Illness 65 y.o. female presents to [facility] ED on 02/27/2021 with worsening SOB and LE edema. Pt found to be hypoxic by EMS. Pt admitted for CHF exacerbation. PMH includes chronic diastolic heart failure, asthma, HLD, HTN, endometrial/cervical cancer.    PT Comments    Continuing work on functional mobility and activity tolerance;  Showing excellent progress with ambulation, needing less supplemental O2 today (2 L compared 3L last session), increased distance, and less need for rest breaks; consider stairs next session   Follow Up Recommendations  Home health PT;Outpatient PT;Other (comment) (Likely will be able to go to Outpt PT) I'm not sure that pt will be homebound when she leaves the hospital   Equipment Recommendations  Supplemental Oxygen;  (Will consider Rollator RW versus no assistive device)    Recommendations for Other Services       Precautions / Restrictions Precautions Precautions: Fall Precaution Comments: Fall risk is present, but minimal Restrictions Weight Bearing Restrictions: No    Mobility  Bed Mobility Overal bed mobility: Modified Independent (HOB elevated)                  Transfers Overall transfer level: Independent Equipment used: None Transfers: Sit to/from Stand Sit to Stand: Independent            Ambulation/Gait Ambulation/Gait assistance: Supervision Gait Distance (Feet): 275 Feet Assistive device: None Gait Pattern/deviations: Decreased stride length Gait velocity: reduced   General Gait Details: Cues to self-monitor for activity tolerance; Overall steady with amb, needing less rest breaks, and did not use the hallway rail for supoprt   Stairs             Wheelchair Mobility    Modified Rankin (Stroke Patients Only)       Balance     Sitting balance-Leahy Scale: Good        Standing balance-Leahy Scale: Fair (apporaching Good)                              Cognition Arousal/Alertness: Awake/alert Behavior During Therapy: WFL for tasks assessed/performed Overall Cognitive Status: Within Functional Limits for tasks assessed                                        Exercises      General Comments General comments (skin integrity, edema, etc.): See other PT note of this date for O2 qualifying walk note      Pertinent Vitals/Pain Pain Assessment: No/denies pain    Home Living                      Prior Function            PT Goals (current goals can now be found in the care plan section) Acute Rehab PT Goals Patient Stated Goal: Be independent again and return home. PT Goal Formulation: With patient Time For Goal Achievement: 03/14/21 Potential to Achieve Goals: Good Progress towards PT goals: Progressing toward goals    Frequency    Min 3X/week      PT Plan Other (comment) (She is moving well, and likely will not be homebound at dc -- will consider Outpt PT follow up)    Co-evaluation  AM-PAC PT "6 Clicks" Mobility   Outcome Measure  Help needed turning from your back to your side while in a flat bed without using bedrails?: None Help needed moving from lying on your back to sitting on the side of a flat bed without using bedrails?: None Help needed moving to and from a bed to a chair (including a wheelchair)?: None Help needed standing up from a chair using your arms (e.g., wheelchair or bedside chair)?: None Help needed to walk in hospital room?: None Help needed climbing 3-5 steps with a railing? : A Little 6 Click Score: 23    End of Session Equipment Utilized During Treatment: Oxygen Activity Tolerance: Patient tolerated treatment well Patient left: Other (comment) (managing independently in room) Nurse Communication: Mobility status PT Visit Diagnosis: Other  abnormalities of gait and mobility (R26.89);Unsteadiness on feet (R26.81)     Time: 1000-1019 PT Time Calculation (min) (ACUTE ONLY): 19 min  Charges:  $Gait Training: 8-22 mins                     Roney Marion, [place]  Acute Rehabilitation Services Pager [phone] Office [place] 03/02/2021, 11:58 AM

## 2021-03-02 NOTE — Progress Notes (Signed)
PROGRESS NOTE    Bridget Lamb  [id number] DOB: 03-May-1956 DOA: 02/27/2021 PCP: [name], PA-C   No chief complaint on file.  Brief Narrative:  Bridget Lamb is Bridget Lamb 65 y.o. female with medical history significant of chronic diastolic heart failure, asthma, HLD, HTN, endometrial/cervical cancer-who presented to the ED for progressively worsening shortness of breath.  Per patient-she does have some amount of chronic shortness of breath at baseline, however for the past week or so-she has had progressively worsening shortness of breath.  Her shortness of breath is mostly with activity.  She also has worsening lower extremity edema.  When she checked her pulse ox at home and it was around 60/70's-EMS was called-she was subsequently brought to the emergency room-when she first arrived-she was on 6 L of oxygen to maintain O2 saturations.Patient also acknowledges intermittent exertional chest pain as well.  Per patient-she is taking her torsemide only once Konya Fauble day when she is supposed to be taking twice Shulamit Donofrio day, since she was about to run out of it-she only started taking it once Ishika Chesterfield day.  She recently has been traveling and has had dietary indiscretion as well.  No history of nausea, vomiting or diarrhea.  Assessment & Plan:   Active Problems:   Acute on chronic diastolic (congestive) heart failure (HCC)   Hypoxia   Acute on chronic congestive heart failure (HCC)   AKI (acute kidney injury) (HCC)   Benign hypertension with CKD (chronic kidney disease) stage III (HCC)   Mixed hyperlipidemia   Atherosclerosis of aorta (HCC)  Acute hypoxic respiratory failure due to HFpEF with exacerbation:  Likely precipitated by dietary/medication noncompliance CT PE protocol at presentation without PE, notable for small effusions and pulm edema, likely CHF Currently on 2 L, gradually improving Continue lasix daily farxiga per cards Entresto (hold with bump in creatinine) Cardiology c/s,  appreciate recs Echo notable for EF 55-60%, mild LVH, grade II diastolic dysfunction, mod elevated PASP (see report) Strict I/O, daily weights  Acute Kidney Injury Baseline ~1.2 Fluctuating with diuresis, HF meds 1.3 today Follow closely   Chest pain: Likely atypical-EKG/troponins negative.  HTN: Resume amlodipine/nebivolol-follow and adjust as needed.  HLD Statin  Anemia: labs c/w AOCD, iron borderline low as well, likely component of iron def - follow outpatient w/u additionally as indicated.  Retics hypoproliferative. Feraheme per cards  Bronchial asthma: Continue usual bronchodilator regimen  Hypothyroidism: Continue Synthroid  History of endometrial adenocarcinoma: Stable for outpatient follow-up with radiation oncology/GYN oncology.  Currently under observation.  OSA: CPAP nightly   DVT prophylaxis: lovenox Code Status: full  Family Communication: none at bedside Disposition:   Status is: Inpatient  Remains inpatient appropriate because:Inpatient level of care appropriate due to severity of illness   Dispo: The patient is from: Home              Anticipated d/c is to: Home              Patient currently is not medically stable to d/c.   Difficult to place patient No       Consultants:   cardiology  Procedures: Echo IMPRESSIONS    1. Left ventricular ejection fraction, by estimation, is 55 to 60%. The  left ventricle has normal function. Left ventricular endocardial border  not optimally defined to evaluate regional wall motion. There is mild left  ventricular hypertrophy. Left  ventricular diastolic parameters are consistent with Grade II diastolic  dysfunction (pseudonormalization). Elevated left ventricular end-diastolic  pressure.  2. Right ventricular systolic function is normal. The right ventricular  size is mildly enlarged. Mildly increased right ventricular wall  thickness. There is moderately elevated pulmonary artery systolic   pressure.  3. Left atrial size was moderately dilated.  4. Right atrial size was mildly dilated.  5. The mitral valve is normal in structure. Mild to moderate mitral valve  regurgitation.  6. Tricuspid valve regurgitation is mild to moderate.  7. The aortic valve is normal in structure. Aortic valve regurgitation is  not visualized. No aortic stenosis is present.  8. The inferior vena cava is dilated in size with <50% respiratory  variability, suggesting right atrial pressure of 15 mmHg.   Antimicrobials: Anti-infectives (From admission, onward)   None         Subjective: No new complaints  Objective: Vitals:   03/02/21 0518 03/02/21 0800 03/02/21 0903 03/02/21 1148  BP: (!) 114/43  (!) 115/48 125/60  Pulse: (!) 59  (!) 55 (!) 55  Resp: 18  18 16   Temp: 98.4 F (36.9 C)  98.3 F (36.8 C) 98.7 F (37.1 C)  TempSrc: Oral  Oral Oral  SpO2: 97%  97% 99%  Weight:  107.8 kg    Height:        Intake/Output Summary (Last 24 hours) at 03/02/2021 1412 Last data filed at 03/02/2021 1410 Gross per 24 hour  Intake 960 ml  Output 2651 ml  Net -1691 ml   Filed Weights   02/28/21 0434 03/01/21 0442 03/02/21 0800  Weight: 110.7 kg 109.5 kg 107.8 kg    Examination:  General: No acute distress. Cardiovascular: Heart sounds show Olene Godfrey regular rate, and rhythm.  Lungs: Clear to auscultation bilaterally Abdomen: Soft, nontender, nondistended Neurological: Alert and oriented 3. Moves all extremities 4 . Cranial nerves II through XII grossly intact. Skin: Warm and dry. No rashes or lesions. Extremities: 1+ LE edema   Data Reviewed: I have personally reviewed following labs and imaging studies  CBC: Recent Labs  Lab 02/27/21 0955 02/27/21 1822 03/01/21 0149 03/02/21 0829  WBC 8.7 5.6 9.8 5.4  NEUTROABS  --   --  8.6* 4.6  HGB 9.6* 9.5* 9.1* 9.3*  HCT 31.1* 31.2* 28.6* 29.5*  MCV 101.3* 104.3* 97.9 99.3  PLT 134* 120* 145* [id number]    Basic Metabolic Panel: Recent  Labs  Lab 02/27/21 0955 02/27/21 1822 02/28/21 0239 03/01/21 0149 03/02/21 0829  NA 141  --  141 141 141  K 4.6  --  4.1 3.5 3.6  CL 105  --  103 102 103  CO2 29  --  27 32 32  GLUCOSE 123*  --  136* 91 129*  BUN 25*  --  30* 47* 36*  CREATININE 1.23* 1.24* 1.17* 1.51* 1.33*  CALCIUM 9.4  --  9.2 8.9 8.7*  MG  --   --   --  2.1 2.2  PHOS  --   --   --  3.2  --     GFR: Estimated Creatinine Clearance: 50.5 mL/min (Aima Mcwhirt) (by C-G formula based on SCr of 1.33 mg/dL (H)).  Liver Function Tests: Recent Labs  Lab 03/01/21 0149 03/02/21 0829  AST 18 18  ALT 16 17  ALKPHOS 54 62  BILITOT 0.8 0.4  PROT 6.2* 5.9*  ALBUMIN 3.3* 3.1*    CBG: No results for input(s): GLUCAP in the last 168 hours.   Recent Results (from the past 240 hour(s))  Resp Panel by RT-PCR (Flu Arnel Wymer&B, Covid) Nasopharyngeal Swab  Status: None   Collection Time: 02/27/21  6:24 PM   Specimen: Nasopharyngeal Swab; Nasopharyngeal(NP) swabs in vial transport medium  Result Value Ref Range Status   SARS Coronavirus 2 by RT PCR NEGATIVE NEGATIVE Final    Comment: (NOTE) SARS-CoV-2 target nucleic acids are NOT DETECTED.  The SARS-CoV-2 RNA is generally detectable in upper respiratory specimens during the acute phase of infection. The lowest concentration of SARS-CoV-2 viral copies this assay can detect is 138 copies/mL. Makenlee Mckeag negative result does not preclude SARS-Cov-2 infection and should not be used as the sole basis for treatment or other patient management decisions. Abri Vacca negative result may occur with  improper specimen collection/handling, submission of specimen other than nasopharyngeal swab, presence of viral mutation(s) within the areas targeted by this assay, and inadequate number of viral copies(<138 copies/mL). Marla Pouliot negative result must be combined with clinical observations, patient history, and epidemiological information. The expected result is Negative.  Fact Sheet for Patients:   EntrepreneurPulse.com.au  Fact Sheet for Healthcare Providers:  IncredibleEmployment.be  This test is no t yet approved or cleared by the [place] FDA and  has been authorized for detection and/or diagnosis of SARS-CoV-2 by FDA under an Emergency Use Authorization (EUA). This EUA will remain  in effect (meaning this test can be used) for the duration of the COVID-19 declaration under Section 564(b)(1) of the Act, 21 U.S.C.section 360bbb-3(b)(1), unless the authorization is terminated  or revoked sooner.       Influenza Jil Penland by PCR NEGATIVE NEGATIVE Final   Influenza B by PCR NEGATIVE NEGATIVE Final    Comment: (NOTE) The Xpert Xpress SARS-CoV-2/FLU/RSV plus assay is intended as an aid in the diagnosis of influenza from Nasopharyngeal swab specimens and should not be used as Constantino Starace sole basis for treatment. Nasal washings and aspirates are unacceptable for Xpert Xpress SARS-CoV-2/FLU/RSV testing.  Fact Sheet for Patients: EntrepreneurPulse.com.au  Fact Sheet for Healthcare Providers: IncredibleEmployment.be  This test is not yet approved or cleared by the [place] FDA and has been authorized for detection and/or diagnosis of SARS-CoV-2 by FDA under an Emergency Use Authorization (EUA). This EUA will remain in effect (meaning this test can be used) for the duration of the COVID-19 declaration under Section 564(b)(1) of the Act, 21 U.S.C. section 360bbb-3(b)(1), unless the authorization is terminated or revoked.  Performed at [facility], [place] [address], [place], [place] [address]          Radiology Studies: ECHOCARDIOGRAM COMPLETE  Result Date: 03/02/2021    ECHOCARDIOGRAM REPORT   Patient Name:   SHARESA KEMP Date of Exam: 03/01/2021 Medical Rec #:  [id number]           Height:       64.0 in Accession #:    [id number]          Weight:       241.4 lb Date of Birth:  1956-08-10            BSA:          2.119 m Patient Age:    14 years            BP:           122/58 mmHg Patient Gender: F                   HR:           57 bpm. Exam Location:  Inpatient Procedure: 2D Echo Indications:     chest  pain  History:         Patient has prior history of Echocardiogram examinations, most                  recent 02/21/2020. CHF; Risk Factors:Hypertension, Dyslipidemia                  and Sleep Apnea.  Sonographer:     Johny Chess RDCS Referring Phys:  [place] Diagnosing Phys: Adrian Prows MD  Sonographer Comments: Patient is morbidly obese. Image acquisition challenging due to patient body habitus. IMPRESSIONS  1. Left ventricular ejection fraction, by estimation, is 55 to 60%. The left ventricle has normal function. Left ventricular endocardial border not optimally defined to evaluate regional wall motion. There is mild left ventricular hypertrophy. Left ventricular diastolic parameters are consistent with Grade II diastolic dysfunction (pseudonormalization). Elevated left ventricular end-diastolic pressure.  2. Right ventricular systolic function is normal. The right ventricular size is mildly enlarged. Mildly increased right ventricular wall thickness. There is moderately elevated pulmonary artery systolic pressure.  3. Left atrial size was moderately dilated.  4. Right atrial size was mildly dilated.  5. The mitral valve is normal in structure. Mild to moderate mitral valve regurgitation.  6. Tricuspid valve regurgitation is mild to moderate.  7. The aortic valve is normal in structure. Aortic valve regurgitation is not visualized. No aortic stenosis is present.  8. The inferior vena cava is dilated in size with <50% respiratory variability, suggesting right atrial pressure of 15 mmHg. FINDINGS  Left Ventricle: Left ventricular ejection fraction, by estimation, is 55 to 60%. The left ventricle has normal function. Left ventricular endocardial border not optimally defined to evaluate  regional wall motion. The left ventricular internal cavity size was normal in size. There is mild left ventricular hypertrophy. Left ventricular diastolic parameters are consistent with Grade II diastolic dysfunction (pseudonormalization). Elevated left ventricular end-diastolic pressure. Right Ventricle: The right ventricular size is mildly enlarged. Mildly increased right ventricular wall thickness. Right ventricular systolic function is normal. There is moderately elevated pulmonary artery systolic pressure. The tricuspid regurgitant velocity is 2.87 m/s, and with an assumed right atrial pressure of 15 mmHg, the estimated right ventricular systolic pressure is 50.0 mmHg. Left Atrium: Left atrial size was moderately dilated. Right Atrium: Right atrial size was mildly dilated. Pericardium: There is no evidence of pericardial effusion. Mitral Valve: The mitral valve is normal in structure. Mild to moderate mitral valve regurgitation. Tricuspid Valve: The tricuspid valve is normal in structure. Tricuspid valve regurgitation is mild to moderate. Aortic Valve: The aortic valve is normal in structure. Aortic valve regurgitation is not visualized. No aortic stenosis is present. Aortic valve mean gradient measures 10.0 mmHg. Aortic valve peak gradient measures 18.5 mmHg. Aortic valve area, by VTI measures 1.69 cm. Pulmonic Valve: The pulmonic valve was grossly normal. Pulmonic valve regurgitation is trivial. Aorta: The aortic root is normal in size and structure. Venous: The inferior vena cava is dilated in size with less than 50% respiratory variability, suggesting right atrial pressure of 15 mmHg. IAS/Shunts: No atrial level shunt detected by color flow Doppler.  LEFT VENTRICLE PLAX 2D LVIDd:         5.20 cm  Diastology LVIDs:         3.40 cm  LV e' medial:    7.40 cm/s LV PW:         1.00 cm  LV E/e' medial:  22.0 LV IVS:  0.90 cm  LV e' lateral:   7.51 cm/s LVOT diam:     1.80 cm  LV E/e' lateral: 21.7 LV SV:          89 LV SV Index:   42 LVOT Area:     2.54 cm  RIGHT VENTRICLE             IVC RV S prime:     12.70 cm/s  IVC diam: 2.80 cm TAPSE (M-mode): 2.5 cm LEFT ATRIUM             Index       RIGHT ATRIUM           Index LA diam:        4.10 cm 1.93 cm/m  RA Area:     18.30 cm LA Vol (A2C):   93.2 ml 43.98 ml/m RA Volume:   48.10 ml  22.70 ml/m LA Vol (A4C):   74.0 ml 34.92 ml/m LA Biplane Vol: 87.2 ml 41.15 ml/m  AORTIC VALVE AV Area (Vmax):    1.66 cm AV Area (Vmean):   1.63 cm AV Area (VTI):     1.69 cm AV Vmax:           215.00 cm/s AV Vmean:          146.000 cm/s AV VTI:            0.525 m AV Peak Grad:      18.5 mmHg AV Mean Grad:      10.0 mmHg LVOT Vmax:         140.00 cm/s LVOT Vmean:        93.800 cm/s LVOT VTI:          0.349 m LVOT/AV VTI ratio: 0.66  AORTA Ao Root diam: 2.70 cm Ao Asc diam:  3.20 cm MITRAL VALVE                TRICUSPID VALVE MV Area (PHT): 3.85 cm     TR Peak grad:   32.9 mmHg MV Decel Time: 197 msec     TR Vmax:        287.00 cm/s MV E velocity: 163.00 cm/s MV Tekila Caillouet velocity: 125.00 cm/s  SHUNTS MV E/Jerrid Forgette ratio:  1.30         Systemic VTI:  0.35 m                             Systemic Diam: 1.80 cm Adrian Prows MD Electronically signed by Adrian Prows MD Signature Date/Time: 03/02/2021/11:55:33 AM    Final         Scheduled Meds: . aspirin EC  81 mg Oral Daily  . dapagliflozin propanediol  5 mg Oral Daily  . enoxaparin (LOVENOX) injection  50 mg Subcutaneous Q24H  . ferrous sulfate  325 mg Oral BID WC  . furosemide  40 mg Intravenous Daily  . levothyroxine  150 mcg Oral QAC breakfast  . loratadine  10 mg Oral Daily  . mometasone-formoterol  2 puff Inhalation BID  . nebivolol  20 mg Oral Daily  . olopatadine  1 drop Both Eyes BID  . rosuvastatin  10 mg Oral QHS  . sodium chloride flush  3 mL Intravenous Q12H   Continuous Infusions: . sodium chloride    . ferumoxytol       LOS: 3 days    Time spent: over 30 min    Fayrene Helper, MD [facility]   To  contact the attending provider between 7A-7P or the covering provider during after hours 7P-7A, please log into the web site [email or web address] and access using universal [place] password for that web site. If you do not have the password, please call the hospital operator.  03/02/2021, 2:12 PM

## 2021-03-02 NOTE — Progress Notes (Signed)
Patient refused CPAP HS.  

## 2021-03-03 ENCOUNTER — Other Ambulatory Visit (HOSPITAL_COMMUNITY): Payer: Self-pay

## 2021-03-03 LAB — BASIC METABOLIC PANEL
Anion gap: 7 (ref 5–15)
BUN: 30 mg/dL — ABNORMAL HIGH (ref 8–23)
CO2: 34 mmol/L — ABNORMAL HIGH (ref 22–32)
Calcium: 9 mg/dL (ref 8.9–10.3)
Chloride: 101 mmol/L (ref 98–111)
Creatinine, Ser: 1.28 mg/dL — ABNORMAL HIGH (ref 0.44–1.00)
GFR, Estimated: 46 mL/min — ABNORMAL LOW (ref >60)
Glucose, Bld: 94 mg/dL (ref 70–99)
Potassium: 3.9 mmol/L (ref 3.5–5.1)
Sodium: 142 mmol/L (ref 135–145)

## 2021-03-03 LAB — CBC WITH DIFFERENTIAL/PLATELET
Abs Immature Granulocytes: 0.02 10*3/uL (ref 0.00–0.07)
Basophils Absolute: 0 10*3/uL (ref 0.0–0.1)
Basophils Relative: 0 %
Eosinophils Absolute: 0.1 10*3/uL (ref 0.0–0.5)
Eosinophils Relative: 3 %
HCT: 28.9 % — ABNORMAL LOW (ref 36.0–46.0)
Hemoglobin: 9.1 g/dL — ABNORMAL LOW (ref 12.0–15.0)
Immature Granulocytes: 0 %
Lymphocytes Relative: 9 %
Lymphs Abs: 0.5 10*3/uL — ABNORMAL LOW (ref 0.7–4.0)
MCH: 31.1 pg (ref 26.0–34.0)
MCHC: 31.5 g/dL (ref 30.0–36.0)
MCV: 98.6 fL (ref 80.0–100.0)
Monocytes Absolute: 0.3 10*3/uL (ref 0.1–1.0)
Monocytes Relative: 6 %
Neutro Abs: 4.5 10*3/uL (ref 1.7–7.7)
Neutrophils Relative %: 82 %
Platelets: 139 10*3/uL — ABNORMAL LOW (ref 150–400)
RBC: 2.93 MIL/uL — ABNORMAL LOW (ref 3.87–5.11)
RDW: 14.1 % (ref 11.5–15.5)
WBC: 5.5 10*3/uL (ref 4.0–10.5)
nRBC: 0 % (ref 0.0–0.2)

## 2021-03-03 LAB — BRAIN NATRIURETIC PEPTIDE: B Natriuretic Peptide: 269.7 pg/mL — ABNORMAL HIGH (ref 0.0–100.0)

## 2021-03-03 LAB — PHOSPHORUS: Phosphorus: 4.2 mg/dL (ref 2.5–4.6)

## 2021-03-03 LAB — MAGNESIUM: Magnesium: 2.3 mg/dL (ref 1.7–2.4)

## 2021-03-03 MED ORDER — HEPARIN SODIUM (PORCINE) 1000 UNIT/ML IJ SOLN
INTRAMUSCULAR | Status: AC
  Filled 2021-03-03: qty 1

## 2021-03-03 MED ORDER — SALINE SPRAY 0.65 % NA SOLN
1.0000 | NASAL | Status: DC | PRN
  Filled 2021-03-03: qty 44

## 2021-03-03 MED ORDER — TORSEMIDE 20 MG PO TABS
20.0000 mg | ORAL_TABLET | Freq: Every day | ORAL | Status: DC
  Administered 2021-03-03 – 2021-03-04 (×2): 20 mg via ORAL
  Filled 2021-03-03 (×2): qty 1

## 2021-03-03 MED ORDER — LIDOCAINE-EPINEPHRINE 1 %-1:100000 IJ SOLN
INTRAMUSCULAR | Status: AC
  Filled 2021-03-03: qty 1

## 2021-03-03 MED ORDER — DAPAGLIFLOZIN PROPANEDIOL 10 MG PO TABS
10.0000 mg | ORAL_TABLET | Freq: Every day | ORAL | Status: DC
  Administered 2021-03-04: 10 mg via ORAL
  Filled 2021-03-03: qty 1

## 2021-03-03 NOTE — Progress Notes (Signed)
Physical Therapy Treatment Patient Details Name: Bridget Lamb MRN: [id number] DOB: 04-17-1956 Today's Date: 03/03/2021    History of Present Illness 65 y.o. female presents to [facility] ED on 02/27/2021 with worsening SOB and LE edema. Pt found to be hypoxic by EMS. Pt admitted for CHF exacerbation. PMH includes chronic diastolic heart failure, asthma, HLD, HTN, endometrial/cervical cancer.    PT Comments    Patient progressing well towards PT goals. Tolerated gait training and stair training with supervision for safety. Sp02 remained in high 90s on 3L/min 02 [place], removed nasal cannula for last half of ambulation and Sp02 dropped to 88%, but pt rebounds quickly with rest and pursed lip breathing. Discussed weaning with patient esp when in room at rest as she only requires it with activity. Encouraged walking with nursing again later today and walking to bathroom PRN. Will need to negotiate 1 flight of stairs prior to d/c; continues to fatigue easily. Will follow.   Follow Up Recommendations  Outpatient PT     Equipment Recommendations  None recommended by PT    Recommendations for Other Services       Precautions / Restrictions Precautions Precautions: Fall;Other (comment) Precaution Comments: Fall risk is present, but minimal, watch 02 Restrictions Weight Bearing Restrictions: No    Mobility  Bed Mobility               General bed mobility comments: Sitting EOB upon PT arrival.    Transfers Overall transfer level: Independent Equipment used: None Transfers: Sit to/from Stand Sit to Stand: Independent         General transfer comment: Stood from EOB x1, no difficulties.  Ambulation/Gait Ambulation/Gait assistance: Supervision Gait Distance (Feet): 140 Feet (+100') Assistive device: None Gait Pattern/deviations: Step-through pattern;Decreased stride length Gait velocity: reduced Gait velocity interpretation: 1.31 - 2.62 ft/sec, indicative of limited community  ambulator General Gait Details: Slow, mostly steady gait, 1 seated rest break post stair training. Cues to self monitor for activity tolerance. SP02 stayed in high 90s on 3L/min 02 [place], removed for last half of ambulation and dropped to 88% but rebounds quickly. 2/4 DOE. good demo of pursed lip breathing.   Stairs Stairs: Yes Stairs assistance: Supervision Stair Management: Step to pattern;Two rails Number of Stairs: 5 General stair comments: Cues for technique and safety, 2/4 DOE.   Wheelchair Mobility    Modified Rankin (Stroke Patients Only)       Balance Overall balance assessment: Needs assistance Sitting-balance support: Feet supported;No upper extremity supported Sitting balance-Leahy Scale: Good     Standing balance support: During functional activity Standing balance-Leahy Scale: Good                              Cognition Arousal/Alertness: Awake/alert Behavior During Therapy: WFL for tasks assessed/performed Overall Cognitive Status: Within Functional Limits for tasks assessed                                        Exercises      General Comments General comments (skin integrity, edema, etc.): Sp02 dropped to 88% on RA, remained in high 90s on 3L/min 02 [place].      Pertinent Vitals/Pain Pain Assessment: Faces Faces Pain Scale: Hurts a little bit Pain Location: headache Pain Descriptors / Indicators: Headache Pain Intervention(s): Monitored during session;Repositioned    Home Living  Prior Function            PT Goals (current goals can now be found in the care plan section) Progress towards PT goals: Progressing toward goals    Frequency    Min 3X/week      PT Plan Current plan remains appropriate    Co-evaluation              AM-PAC PT "6 Clicks" Mobility   Outcome Measure  Help needed turning from your back to your side while in a flat bed without using bedrails?:  None Help needed moving from lying on your back to sitting on the side of a flat bed without using bedrails?: None Help needed moving to and from a bed to a chair (including a wheelchair)?: None Help needed standing up from a chair using your arms (e.g., wheelchair or bedside chair)?: None Help needed to walk in hospital room?: None Help needed climbing 3-5 steps with a railing? : A Little 6 Click Score: 23    End of Session Equipment Utilized During Treatment: Oxygen Activity Tolerance: Patient tolerated treatment well Patient left: in bed (sitting EOB) Nurse Communication: Mobility status PT Visit Diagnosis: Other abnormalities of gait and mobility (R26.89);Unsteadiness on feet (R26.81)     Time: 1101-1120 PT Time Calculation (min) (ACUTE ONLY): 19 min  Charges:  $Gait Training: 8-22 mins                     Marisa Severin, PT, DPT Acute Rehabilitation Services Pager [phone] Office [place] 03/03/2021, 12:14 PM

## 2021-03-03 NOTE — Progress Notes (Signed)
Heart Failure Stewardship Pharmacist Progress Note   PCP: [name], PA-C PCP-Cardiologist: None    HPI:  65 yo F with obesity, HTN, restrictive airway disease, HLD, HFpEF, and OSA. She presented to the ED on 02/27/21 with acute decompensated diastolic heart failure. Chest xray on 5/6 showed cardiomegaly with small effusions suggestive of volume overload. An ECHO was done on 03/01/21 and LVEF was 55-60%. Concern for dietary and medication noncompliance.   Current HF Medications: Torsemide 20 mg daily Farxiga 5 mg daily  Prior to admission HF Medications: Torsemide 20 mg daily  Pertinent Lab Values: . Serum creatinine 1.28, BUN 30, Potassium 3.9, Sodium 142, BNP 873.8, Magnesium 2.3   Vital Signs: . Weight: 236 lbs (admission weight: 246 lbs) . Blood pressure: 120/70s  . Heart rate: 50s   Medication Assistance / Insurance Benefits Check: Does the patient have prescription insurance?  Yes Type of insurance plan: [employer]  Outpatient Pharmacy:  Prior to admission outpatient pharmacy: [facility] Is the patient willing to use [facility] at discharge? Yes Is the patient willing to transition their outpatient pharmacy to utilize a [facility] outpatient pharmacy?   Pending    Assessment: 1. Acute on chronic diastolic CHF (EF 46-96%), due to dietary and medication noncompliance. NYHA class II symptoms. - Continue torsemide 20 mg daily - On Farxiga 5 mg daily - recommend increasing to 10 mg daily for HF indication - Consider starting Entresto prior to discharge pending BP trends   Plan: 1) Medication changes recommended at this time: - Increase Farxiga to 10 mg daily  2) Patient assistance: - Insurance will only cover prescriptions at [employer] or [facility] [facility] (unable to obtain copay information)  3)  Education  - To be completed prior to discharge  Kerby Nora, PharmD, BCPS Heart Failure Cytogeneticist Phone [phone]

## 2021-03-03 NOTE — TOC Initial Note (Addendum)
Transition of Care (TOC) - Initial/Assessment Note  Heart Failure   Patient Details  Name: Bridget Lamb MRN: [id number] Date of Birth: 21-Apr-1956  Transition of Care [facility]) CM/SW Contact:    [place], [place] Phone Number: 03/03/2021, 3:41 PM  Clinical Narrative:          CSW per RN HF Navigator spoke with patient at bedside about the Heart Failure Clinic and bridging the gap between inpatient and outpatient care. CSW completed a very brief SDOH with the patient and assessing immediate needs. The patient reported being open to [employer] and a disability application as she works full-time at [employer] but would like to retire at some point and is also trying to get custody of her 27 year old grandson after his mother/her daughter passed away. The patient reported that her 2 grandson's (44 and 73 year old) live with her as well as her daughter before she passed. The patient reported that she does have a PCP and can get her medications. CSW spoke with the patient about the [facility] and following up at the clinic for further care and especially after medications adjustments. However, the patient indicated that she would like to continue to see her Cardiologist and that they have been by to see her while she's been in the hospital.   [facility] will continue to follow through discharge.   Expected Discharge Plan: [place] Barriers to Discharge: Continued Medical Work up   Patient Goals and CMS Choice        Expected Discharge Plan and Services Expected Discharge Plan: [place] In-house Referral: Clinical Social Work     Living arrangements for the past 2 months: [place]                                      Prior Living Arrangements/Services Living arrangements for the past 2 months: Single Family Home Lives with:: [place] (88 year old grandson and 61 year old grandson) Patient language and need for  interpreter reviewed:: Yes        Need for Family Participation in Patient Care: No (Comment) Care giver support system in place?: No (comment)   Criminal Activity/Legal Involvement Pertinent to Current Situation/Hospitalization: No - Comment as needed  Activities of Daily Living Home Assistive Devices/Equipment: None ADL Screening (condition at time of admission) Patient's cognitive ability adequate to safely complete daily activities?: Yes Is the patient deaf or have difficulty hearing?: No Does the patient have difficulty seeing, even when wearing glasses/contacts?: No Does the patient have difficulty concentrating, remembering, or making decisions?: No Patient able to express need for assistance with ADLs?: Yes Does the patient have difficulty dressing or bathing?: No Independently performs ADLs?: Yes (appropriate for developmental age) Does the patient have difficulty walking or climbing stairs?: Yes Weakness of Legs: None Weakness of Arms/Hands: None  Permission Sought/Granted                  Emotional Assessment Appearance:: Appears stated age Attitude/Demeanor/Rapport: Engaged Affect (typically observed): Pleasant Orientation: : Oriented to Self,Oriented to Place,Oriented to  Time,Oriented to Situation Alcohol / Substance Use: Never Used Psych Involvement: No (comment)  Admission diagnosis:  Hypoxia [R09.02] Acute on chronic diastolic (congestive) heart failure ([place]) [I50.33] Acute on chronic congestive heart failure, unspecified heart failure type [facility]) [I50.9] Patient Active Problem List   Diagnosis Date  Noted  . Acute on chronic congestive heart failure ([place])   . AKI (acute kidney injury) ([place])   . Benign hypertension with CKD (chronic kidney disease) stage III ([place])   . Mixed hyperlipidemia   . Atherosclerosis of aorta ([place])   . Hypoxia   . Acute on chronic diastolic (congestive) heart failure ([place]) 02/27/2021  . Vaginal lesion 05/01/2020  . OSA  (obstructive sleep apnea) 03/07/2020  . Abnormal findings on diagnostic imaging of lung 03/07/2020  . Acute pulmonary edema ([place]) 02/20/2020  . Hypertensive urgency   . Dyspnea   . Endometrial cancer ([place]) 10/12/2019  . Bilateral leg edema 12/20/2018  . Chronic heart failure with preserved ejection fraction ([place]) 12/20/2018  . Asthma, chronic, unspecified asthma severity, with acute exacerbation 08/29/2018  . Acute respiratory failure with hypoxia and hypercapnia ([place]) 08/29/2018  . Iron deficiency anemia 02/04/2015  . Chronic diastolic CHF (congestive heart failure) ([place]) 12/24/2013  . Obesity, Class III, BMI 40-49.9 (morbid obesity) ([place]) 10/09/2013  . Essential hypertension, benign 01/31/2012  . Dyslipidemia 01/31/2012  . Hypothyroidism 01/31/2012  . Anemia 01/31/2012   PCP:  [name] Pharmacy:   [facility] [address] [place] - [address] [place] [place] [address] Phone: [phone] Fax: [place] [address] [place] [address] Phone: [phone] Fax: [phone]     Social Determinants of Health (SDOH) Interventions Food Insecurity Interventions: Assist with SNAP Application Financial Strain Interventions: Other (Comment) (disability and food stamp referral with [place]) Housing Interventions: Intervention Not Indicated Transportation Interventions: Intervention Not Indicated  Readmission Risk Interventions No flowsheet data found.  Letasha Kershaw, MSW, [place] Heart Failure Social Worker

## 2021-03-03 NOTE — Progress Notes (Signed)
Subjective:  Patient states that she is feeling much improved.  Oxygen saturation low and may need home oxygen.  No chest pain, leg edema is improved but still present.  Intake/Output from previous day:  I/O last 3 completed shifts: In: 29 [P.O.:960; IV Piggyback:117] Out: 2651 [Urine:2650; Stool:1] No intake/output data recorded.  Blood pressure 123/78, pulse (!) 55, temperature 98.4 F (36.9 C), temperature source Oral, resp. rate 16, height $RemoveBe'5\' 4"'ThFYRgJYw$  (1.626 m), weight 107.2 kg, last menstrual period 10/22/2011, SpO2 98 %.   Intake/Output Summary (Last 24 hours) at 03/03/2021 1223 Last data filed at 03/03/2021 7341 Gross per 24 hour  Intake 597 ml  Output 1450 ml  Net -853 ml      Physical Exam Constitutional:      General: She is not in acute distress.    Appearance: She is obese.  HENT:     Mouth/Throat:     Mouth: Mucous membranes are moist.  Eyes:     Extraocular Movements: Extraocular movements intact.  Neck:     Vascular: No carotid bruit or JVD.  Cardiovascular:     Rate and Rhythm: Normal rate and regular rhythm.     Pulses: Intact distal pulses.     Heart sounds: Normal heart sounds. No murmur heard. No gallop.   Pulmonary:     Effort: Pulmonary effort is normal.     Breath sounds: Wheezing (scattered) and rales (bibasilar) present.  Abdominal:     General: Bowel sounds are normal.     Palpations: Abdomen is soft.     Comments: Obese  Musculoskeletal:        General: Swelling (2 + bilateral pitting edema) present. Normal range of motion.     Cervical back: Normal range of motion.  Skin:    Capillary Refill: Capillary refill takes less than 2 seconds.  Neurological:     General: No focal deficit present.    Lab Results: BMP BNP (last 3 results) Recent Labs    02/27/21 0956 02/28/21 0239 03/03/21 0447  BNP 758.6* 873.8* 269.7*    No results for input(s): PROBNP in the last 8760 hours. BMP Latest Ref Rng & Units 03/03/2021 03/02/2021 03/01/2021   Glucose 70 - 99 mg/dL 94 129(H) 91  BUN 8 - 23 mg/dL 30(H) 36(H) 47(H)  Creatinine 0.44 - 1.00 mg/dL 1.28(H) 1.33(H) 1.51(H)  BUN/Creat Ratio 12 - 28 - - -  Sodium 135 - 145 mmol/L 142 141 141  Potassium 3.5 - 5.1 mmol/L 3.9 3.6 3.5  Chloride 98 - 111 mmol/L 101 103 102  CO2 22 - 32 mmol/L 34(H) 32 32  Calcium 8.9 - 10.3 mg/dL 9.0 8.7(L) 8.9   Hepatic Function Latest Ref Rng & Units 03/02/2021 03/01/2021 02/26/2020  Total Protein 6.5 - 8.1 g/dL 5.9(L) 6.2(L) -  Albumin 3.5 - 5.0 g/dL 3.1(L) 3.3(L) 3.5  AST 15 - 41 U/L 18 18 -  ALT 0 - 44 U/L 17 16 -  Alk Phosphatase 38 - 126 U/L 62 54 -  Total Bilirubin 0.3 - 1.2 mg/dL 0.4 0.8 -   CBC Latest Ref Rng & Units 03/03/2021 03/02/2021 03/01/2021  WBC 4.0 - 10.5 K/uL 5.5 5.4 9.8  Hemoglobin 12.0 - 15.0 g/dL 9.1(L) 9.3(L) 9.1(L)  Hematocrit 36.0 - 46.0 % 28.9(L) 29.5(L) 28.6(L)  Platelets 150 - 400 K/uL 139(L) 164 145(L)   Lipid Panel     Component Value Date/Time   CHOL 117 02/28/2021 0239   TRIG 54 02/28/2021 0239   HDL 49 02/28/2021  0239   CHOLHDL 2.4 02/28/2021 0239   VLDL 11 02/28/2021 0239   LDLCALC 57 02/28/2021 0239   Cardiac Panel (last 3 results) No results for input(s): CKTOTAL, CKMB, TROPONINI, RELINDX in the last 72 hours.  HEMOGLOBIN A1C Lab Results  Component Value Date   HGBA1C 5.4 02/28/2021   MPG 108.28 02/28/2021   TSH No results for input(s): TSH in the last 8760 hours. Imaging:  CT angiogram chest 02/27/2021: Lungs/Pleura: Small pleural effusions with basilar atelectasis. Areas of ground-glass opacity within both lungs. No PE. Cardiomegaly, small pleural effusions and pulmonary edema, likely congestive heart failure.  Chest x-ray PA and lateral view 02/27/2021: Cardiomegaly central venous congestion, interstitial prominence, and small effusions suggest volume overload/congestive heart failure.   Cardiac Studies:  Echocardiogram: 02/21/2020: 1. Left ventricular ejection fraction, by estimation, is 60 to  65%. The left ventricle has normal function. The left ventricle has no regional wall motion abnormalities. There is mild concentric left ventricular hypertrophy.  Left ventricular diastolic parameters are consistent with Grade II diastolic dysfunction (pseudonormalization).  Elevated left ventricular end-diastolic pressure.  2. Right ventricular systolic function is normal. The right ventricular size is normal. There is mildly elevated pulmonary artery systolic pressure.  3. Left atrial size was moderately dilated.  4. The mitral valve is normal in structure. Trivial mitral valve regurgitation. No evidence of mitral stenosis.  5. The aortic valve was not well visualized. Aortic valve regurgitation is not visualized. No aortic stenosis is present.  6. The inferior vena cava is dilated in size with <50% respiratory variability, suggesting right atrial pressure of 15 mmHg.   Lexiscan Sestamibi stress test 03/10/2020: No previous exam available for comparison. Lexiscan nuclear stress test performed using 1-day protocol. Stress EKG is non-diagnostic, as this is pharmacological stress test. Normal myocardial perfusion. Stress LVEF 59%. Low risk study.  Echocardiogram 03/02/2021:    1. Left ventricular ejection fraction, by estimation, is 55 to 60%. The left ventricle has normal function. Left ventricular endocardial border not optimally defined to evaluate regional wall motion. There is mild left ventricular hypertrophy. Left  ventricular diastolic parameters are consistent with Grade II diastolic dysfunction (pseudonormalization). Elevated left ventricular end-diastolic pressure.  2. Right ventricular systolic function is normal. The right ventricular size is mildly enlarged. Mildly increased right ventricular wall thickness. There is moderately elevated pulmonary artery systolic pressure.  3. Left atrial size was moderately dilated.  4. Right atrial size was mildly dilated.  5. The mitral valve is  normal in structure. Mild to moderate mitral valve regurgitation.  6. Tricuspid valve regurgitation is mild to moderate.  7. The aortic valve is normal in structure. Aortic valve regurgitation is not visualized. No aortic stenosis is present.  8. The inferior vena cava is dilated in size with <50% respiratory variability, suggesting right atrial pressure of 15 mmHg.   EKG: 02/27/2021: Normal sinus rhythm, 77 bpm, normal axis, without underlying ischemia or injury pattern   Scheduled Meds: . aspirin EC  81 mg Oral Daily  . dapagliflozin propanediol  5 mg Oral Daily  . enoxaparin (LOVENOX) injection  50 mg Subcutaneous Q24H  . ferrous sulfate  325 mg Oral BID WC  . furosemide  40 mg Intravenous Daily  . levothyroxine  150 mcg Oral QAC breakfast  . loratadine  10 mg Oral Daily  . mometasone-formoterol  2 puff Inhalation BID  . nebivolol  20 mg Oral Daily  . olopatadine  1 drop Both Eyes BID  . rosuvastatin  10 mg Oral  QHS  . sodium chloride flush  3 mL Intravenous Q12H   Continuous Infusions: . sodium chloride     PRN Meds:.sodium chloride, acetaminophen, albuterol, ondansetron (ZOFRAN) IV, senna-docusate, sodium chloride, sodium chloride flush  Assessment/Plan:   Bridget Lamb is a 65 y.o. Caucasian female patient with morbid obesity BMI 44, hypertension, restrictive airway disease due to bronchial asthma, hyperlipidemia, HFpEF, OSA and not using CPAP due to feeling claustrophobia. Now admitted with acute decompensated diastolic heart failure.  1.  Acute on chronic diastolic heart failure 2.  Morbid obesity due to excess calories, BMI 40-45 range, with significant serious comorbidity, BMI 40+. 3.  Restrictive airway disease due to bronchial asthma. 4.  Obstructive sleep apnea not on CPAP contributing to recurrence of heart failure.  She is being evaluated for home oxygen needs. 5.  Stage IIIa chronic kidney disease 6.  Chronic cor pulmonale and moderate pulmonary hypertension  related to #1 and #3 7.  Iron deficiency anemia  Recommendation: Patient still has fluid overload state.  But much improved.  I will transition her from IV Lasix to p.o. torsemide 20 mg daily.  On Farxiga, she has had excellent diuresis.  Continue the same.  Serum creatinine is remained stable.  ACE inhibitors and ARB's are on hold expecting worsening renal function due to diuresis.  However serum creatinine is remained stable, I will initiate 40 mg of valsartan.   Suspect she may be able to be discharged home tomorrow.  With regard to iron deficiency anemia, appears that she may have anemia of chronic disease along with iron deficiency anemia.  She received Feraheme yesterday.  Hopefully this will help with improvement in anemia and overall heart failure symptoms.    Adrian Prows, MD, [facility] 03/03/2021, 12:23 PM Office: [phone] Pager: [phone]

## 2021-03-03 NOTE — Progress Notes (Signed)
  Mobility Specialist Criteria Algorithm Info.  Mobility Team: HOB elevated: Activity: Ambulated in hall; Dangled on edge of bed Range of motion: Active; All extremities Level of assistance: Independent Assistive device: None Minutes sitting in chair:  Minutes stood: 5 minutes Minutes ambulated: 5 minutes Distance ambulated (ft): 360 ft Mobility response: Tolerated well Bed Position: Semi-fowlers  Patient pleasantly agreed to participate in mobility this morning. Ambulated in hallway 360 feet independently with steady gait. Tolerated ambulation well without complaint or incident and was lying supine in bed with all needs met.   03/03/2021 12:57 PM

## 2021-03-03 NOTE — Progress Notes (Signed)
Heart Failure Nurse Navigator Progress Note  Navigation team following this hospitalization. Plan for screening interview for HV TOC readiness, possible DC tomorrow.   Pricilla Holm, RN, BSN Heart Failure Nurse Navigator [phone]

## 2021-03-03 NOTE — Progress Notes (Signed)
PROGRESS NOTE    Bridget Lamb  NIO:[id number] DOB: April 11, 1956 DOA: 02/27/2021 PCP: [name], PA-C   No chief complaint on file.  Brief Narrative:  Bridget Lamb is Bridget Lamb 65 y.o. female with medical history significant of chronic diastolic heart failure, asthma, HLD, HTN, endometrial/cervical cancer-who presented to the ED for progressively worsening shortness of breath.  Per patient-she does have some amount of chronic shortness of breath at baseline, however for the past week or so-she has had progressively worsening shortness of breath.  Her shortness of breath is mostly with activity.  She also has worsening lower extremity edema.  When she checked her pulse ox at home and it was around 60/70's-EMS was called-she was subsequently brought to the emergency room-when she first arrived-she was on 6 L of oxygen to maintain O2 saturations.Patient also acknowledges intermittent exertional chest pain as well.  Per patient-she is taking her torsemide only once Enid Maultsby day when she is supposed to be taking twice Kerryn Tennant day, since she was about to run out of it-she only started taking it once Nick Armel day.  She recently has been traveling and has had dietary indiscretion as well.  No history of nausea, vomiting or diarrhea.  Assessment & Plan:   Active Problems:   Acute on chronic diastolic (congestive) heart failure (HCC)   Hypoxia   Acute on chronic congestive heart failure (HCC)   AKI (acute kidney injury) (HCC)   Benign hypertension with CKD (chronic kidney disease) stage III (HCC)   Mixed hyperlipidemia   Atherosclerosis of aorta (HCC)  Acute hypoxic respiratory failure due to HFpEF with exacerbation:  Likely precipitated by dietary/medication noncompliance CT PE protocol at presentation without PE, notable for small effusions and pulm edema, likely CHF Currently on 2-3 L Continue lasix daily farxiga per cards Entresto (hold with bump in creatinine) Cardiology c/s, appreciate recs Echo  notable for EF 55-60%, mild LVH, grade II diastolic dysfunction, mod elevated PASP (see report) Strict I/O, daily weights - net negative 5.8 L, weight downtrending  Acute Kidney Injury Baseline ~1.2 Fluctuating with diuresis, HF meds 1.28 today Follow closely   Chest pain: Likely atypical-EKG/troponins negative.  HTN: Resume amlodipine/nebivolol-follow and adjust as needed.  HLD Statin  Anemia: labs c/w AOCD, iron borderline low as well, likely component of iron def - follow outpatient w/u additionally as indicated.  Retics hypoproliferative. S/p feraheme  Bronchial asthma: Continue usual bronchodilator regimen  Hypothyroidism: Continue Synthroid  History of endometrial adenocarcinoma: Stable for outpatient follow-up with radiation oncology/GYN oncology.  Currently under observation.  OSA: CPAP nightly  Headache: mild, follow with APAP   DVT prophylaxis: lovenox Code Status: full  Family Communication: none at bedside Disposition:   Status is: Inpatient  Remains inpatient appropriate because:Inpatient level of care appropriate due to severity of illness   Dispo: The patient is from: Home              Anticipated d/c is to: Home              Patient currently is not medically stable to d/c.   Difficult to place patient No       Consultants:   cardiology  Procedures: Echo IMPRESSIONS    1. Left ventricular ejection fraction, by estimation, is 55 to 60%. The  left ventricle has normal function. Left ventricular endocardial border  not optimally defined to evaluate regional wall motion. There is mild left  ventricular hypertrophy. Left  ventricular diastolic parameters are consistent with Grade II diastolic  dysfunction (pseudonormalization). Elevated left ventricular end-diastolic  pressure.  2. Right ventricular systolic function is normal. The right ventricular  size is mildly enlarged. Mildly increased right ventricular wall  thickness. There  is moderately elevated pulmonary artery systolic  pressure.  3. Left atrial size was moderately dilated.  4. Right atrial size was mildly dilated.  5. The mitral valve is normal in structure. Mild to moderate mitral valve  regurgitation.  6. Tricuspid valve regurgitation is mild to moderate.  7. The aortic valve is normal in structure. Aortic valve regurgitation is  not visualized. No aortic stenosis is present.  8. The inferior vena cava is dilated in size with <50% respiratory  variability, suggesting right atrial pressure of 15 mmHg.   Antimicrobials: Anti-infectives (From admission, onward)   None         Subjective: Notes dry nose, mild headache - just doesn't feel 100% (discussed we'd get humidified O2 for her to help with dry nose)  Objective: Vitals:   03/02/21 1518 03/02/21 1923 03/03/21 0507 03/03/21 0821  BP: 130/65 (!) 113/51 (!) 145/62   Pulse: (!) 52 (!) 55 (!) 58 (!) 57  Resp:  16 18 16   Temp: 98.5 F (36.9 C) 98.3 F (36.8 C) 98.5 F (36.9 C)   TempSrc: Oral Oral Oral   SpO2: 100% 100% 100% 99%  Weight:   107.2 kg   Height:        Intake/Output Summary (Last 24 hours) at 03/03/2021 0849 Last data filed at 03/03/2021 9628 Gross per 24 hour  Intake 597 ml  Output 1451 ml  Net -854 ml   Filed Weights   03/01/21 0442 03/02/21 0800 03/03/21 0507  Weight: 109.5 kg 107.8 kg 107.2 kg    Examination:  General: No acute distress. Cardiovascular: Heart sounds show Quanta Roher regular rate, and rhythm Lungs: Clear to auscultation bilaterally  Abdomen: Soft, nontender, nondistended Neurological: Alert and oriented 3. Moves all extremities 4. Cranial nerves II through XII grossly intact. Skin: Warm and dry. No rashes or lesions. Extremities: compression stockings, trace edema under these   Data Reviewed: I have personally reviewed following labs and imaging studies  CBC: Recent Labs  Lab 02/27/21 0955 02/27/21 1822 03/01/21 0149 03/02/21 0829  03/03/21 0447  WBC 8.7 5.6 9.8 5.4 5.5  NEUTROABS  --   --  8.6* 4.6 4.5  HGB 9.6* 9.5* 9.1* 9.3* 9.1*  HCT 31.1* 31.2* 28.6* 29.5* 28.9*  MCV 101.3* 104.3* 97.9 99.3 98.6  PLT 134* 120* 145* 164 139*    Basic Metabolic Panel: Recent Labs  Lab 02/27/21 0955 02/27/21 1822 02/28/21 0239 03/01/21 0149 03/02/21 0829 03/03/21 0447  NA 141  --  141 141 141 142  K 4.6  --  4.1 3.5 3.6 3.9  CL 105  --  103 102 103 101  CO2 29  --  27 32 32 34*  GLUCOSE 123*  --  136* 91 129* 94  BUN 25*  --  30* 47* 36* 30*  CREATININE 1.23* 1.24* 1.17* 1.51* 1.33* 1.28*  CALCIUM 9.4  --  9.2 8.9 8.7* 9.0  MG  --   --   --  2.1 2.2 2.3  PHOS  --   --   --  3.2  --  4.2    GFR: Estimated Creatinine Clearance: 52.4 mL/min ([place]) (by C-G formula based on SCr of 1.28 mg/dL (H)).  Liver Function Tests: Recent Labs  Lab 03/01/21 0149 03/02/21 0829  AST 18 18  ALT 16  17  ALKPHOS 54 62  BILITOT 0.8 0.4  PROT 6.2* 5.9*  ALBUMIN 3.3* 3.1*    CBG: No results for input(s): GLUCAP in the last 168 hours.   Recent Results (from the past 240 hour(s))  Resp Panel by RT-PCR (Flu Sheffield Hawker&B, Covid) Nasopharyngeal Swab     Status: None   Collection Time: 02/27/21  6:24 PM   Specimen: Nasopharyngeal Swab; Nasopharyngeal(NP) swabs in vial transport medium  Result Value Ref Range Status   SARS Coronavirus 2 by RT PCR NEGATIVE NEGATIVE Final    Comment: (NOTE) SARS-CoV-2 target nucleic acids are NOT DETECTED.  The SARS-CoV-2 RNA is generally detectable in upper respiratory specimens during the acute phase of infection. The lowest concentration of SARS-CoV-2 viral copies this assay can detect is 138 copies/mL. Hilary Pundt negative result does not preclude SARS-Cov-2 infection and should not be used as the sole basis for treatment or other patient management decisions. Diem Pagnotta negative result may occur with  improper specimen collection/handling, submission of specimen other than nasopharyngeal swab, presence of viral  mutation(s) within the areas targeted by this assay, and inadequate number of viral copies(<138 copies/mL). Zarriah Starkel negative result must be combined with clinical observations, patient history, and epidemiological information. The expected result is Negative.  Fact Sheet for Patients:  EntrepreneurPulse.com.au  Fact Sheet for Healthcare Providers:  IncredibleEmployment.be  This test is no t yet approved or cleared by the [place] FDA and  has been authorized for detection and/or diagnosis of SARS-CoV-2 by FDA under an Emergency Use Authorization (EUA). This EUA will remain  in effect (meaning this test can be used) for the duration of the COVID-19 declaration under Section 564(b)(1) of the Act, 21 U.S.C.section 360bbb-3(b)(1), unless the authorization is terminated  or revoked sooner.       Influenza Allyah Heather by PCR NEGATIVE NEGATIVE Final   Influenza B by PCR NEGATIVE NEGATIVE Final    Comment: (NOTE) The Xpert Xpress SARS-CoV-2/FLU/RSV plus assay is intended as an aid in the diagnosis of influenza from Nasopharyngeal swab specimens and should not be used as Maricella Filyaw sole basis for treatment. Nasal washings and aspirates are unacceptable for Xpert Xpress SARS-CoV-2/FLU/RSV testing.  Fact Sheet for Patients: EntrepreneurPulse.com.au  Fact Sheet for Healthcare Providers: IncredibleEmployment.be  This test is not yet approved or cleared by the [place] FDA and has been authorized for detection and/or diagnosis of SARS-CoV-2 by FDA under an Emergency Use Authorization (EUA). This EUA will remain in effect (meaning this test can be used) for the duration of the COVID-19 declaration under Section 564(b)(1) of the Act, 21 U.S.C. section 360bbb-3(b)(1), unless the authorization is terminated or revoked.  Performed at [facility], [place] [address], [place], [place] [address]          Radiology  Studies: ECHOCARDIOGRAM COMPLETE  Result Date: 03/02/2021    ECHOCARDIOGRAM REPORT   Patient Name:   Bridget Lamb Date of Exam: 03/01/2021 Medical Rec #:  [id number]           Height:       64.0 in Accession #:    [id number]          Weight:       241.4 lb Date of Birth:  1956-10-25           BSA:          2.119 m Patient Age:    15 years            BP:  122/58 mmHg Patient Gender: F                   HR:           57 bpm. Exam Location:  Inpatient Procedure: 2D Echo Indications:     chest pain  History:         Patient has prior history of Echocardiogram examinations, most                  recent 02/21/2020. CHF; Risk Factors:Hypertension, Dyslipidemia                  and Sleep Apnea.  Sonographer:     Johny Chess RDCS Referring Phys:  Hernandez Diagnosing Phys: Adrian Prows MD  Sonographer Comments: Patient is morbidly obese. Image acquisition challenging due to patient body habitus. IMPRESSIONS  1. Left ventricular ejection fraction, by estimation, is 55 to 60%. The left ventricle has normal function. Left ventricular endocardial border not optimally defined to evaluate regional wall motion. There is mild left ventricular hypertrophy. Left ventricular diastolic parameters are consistent with Grade II diastolic dysfunction (pseudonormalization). Elevated left ventricular end-diastolic pressure.  2. Right ventricular systolic function is normal. The right ventricular size is mildly enlarged. Mildly increased right ventricular wall thickness. There is moderately elevated pulmonary artery systolic pressure.  3. Left atrial size was moderately dilated.  4. Right atrial size was mildly dilated.  5. The mitral valve is normal in structure. Mild to moderate mitral valve regurgitation.  6. Tricuspid valve regurgitation is mild to moderate.  7. The aortic valve is normal in structure. Aortic valve regurgitation is not visualized. No aortic stenosis is present.  8. The inferior vena cava is  dilated in size with <50% respiratory variability, suggesting right atrial pressure of 15 mmHg. FINDINGS  Left Ventricle: Left ventricular ejection fraction, by estimation, is 55 to 60%. The left ventricle has normal function. Left ventricular endocardial border not optimally defined to evaluate regional wall motion. The left ventricular internal cavity size was normal in size. There is mild left ventricular hypertrophy. Left ventricular diastolic parameters are consistent with Grade II diastolic dysfunction (pseudonormalization). Elevated left ventricular end-diastolic pressure. Right Ventricle: The right ventricular size is mildly enlarged. Mildly increased right ventricular wall thickness. Right ventricular systolic function is normal. There is moderately elevated pulmonary artery systolic pressure. The tricuspid regurgitant velocity is 2.87 m/s, and with an assumed right atrial pressure of 15 mmHg, the estimated right ventricular systolic pressure is 85.0 mmHg. Left Atrium: Left atrial size was moderately dilated. Right Atrium: Right atrial size was mildly dilated. Pericardium: There is no evidence of pericardial effusion. Mitral Valve: The mitral valve is normal in structure. Mild to moderate mitral valve regurgitation. Tricuspid Valve: The tricuspid valve is normal in structure. Tricuspid valve regurgitation is mild to moderate. Aortic Valve: The aortic valve is normal in structure. Aortic valve regurgitation is not visualized. No aortic stenosis is present. Aortic valve mean gradient measures 10.0 mmHg. Aortic valve peak gradient measures 18.5 mmHg. Aortic valve area, by VTI measures 1.69 cm. Pulmonic Valve: The pulmonic valve was grossly normal. Pulmonic valve regurgitation is trivial. Aorta: The aortic root is normal in size and structure. Venous: The inferior vena cava is dilated in size with less than 50% respiratory variability, suggesting right atrial pressure of 15 mmHg. IAS/Shunts: No atrial level  shunt detected by color flow Doppler.  LEFT VENTRICLE PLAX 2D LVIDd:         5.20  cm  Diastology LVIDs:         3.40 cm  LV e' medial:    7.40 cm/s LV PW:         1.00 cm  LV E/e' medial:  22.0 LV IVS:        0.90 cm  LV e' lateral:   7.51 cm/s LVOT diam:     1.80 cm  LV E/e' lateral: 21.7 LV SV:         89 LV SV Index:   42 LVOT Area:     2.54 cm  RIGHT VENTRICLE             IVC RV S prime:     12.70 cm/s  IVC diam: 2.80 cm TAPSE (M-mode): 2.5 cm LEFT ATRIUM             Index       RIGHT ATRIUM           Index LA diam:        4.10 cm 1.93 cm/m  RA Area:     18.30 cm LA Vol (A2C):   93.2 ml 43.98 ml/m RA Volume:   48.10 ml  22.70 ml/m LA Vol (A4C):   74.0 ml 34.92 ml/m LA Biplane Vol: 87.2 ml 41.15 ml/m  AORTIC VALVE AV Area (Vmax):    1.66 cm AV Area (Vmean):   1.63 cm AV Area (VTI):     1.69 cm AV Vmax:           215.00 cm/s AV Vmean:          146.000 cm/s AV VTI:            0.525 m AV Peak Grad:      18.5 mmHg AV Mean Grad:      10.0 mmHg LVOT Vmax:         140.00 cm/s LVOT Vmean:        93.800 cm/s LVOT VTI:          0.349 m LVOT/AV VTI ratio: 0.66  AORTA Ao Root diam: 2.70 cm Ao Asc diam:  3.20 cm MITRAL VALVE                TRICUSPID VALVE MV Area (PHT): 3.85 cm     TR Peak grad:   32.9 mmHg MV Decel Time: 197 msec     TR Vmax:        287.00 cm/s MV E velocity: 163.00 cm/s MV Nigel Wessman velocity: 125.00 cm/s  SHUNTS MV E/Gaspard Isbell ratio:  1.30         Systemic VTI:  0.35 m                             Systemic Diam: 1.80 cm Adrian Prows MD Electronically signed by Adrian Prows MD Signature Date/Time: 03/02/2021/11:55:33 AM    Final         Scheduled Meds: . aspirin EC  81 mg Oral Daily  . dapagliflozin propanediol  5 mg Oral Daily  . enoxaparin (LOVENOX) injection  50 mg Subcutaneous Q24H  . ferrous sulfate  325 mg Oral BID WC  . furosemide  40 mg Intravenous Daily  . levothyroxine  150 mcg Oral QAC breakfast  . loratadine  10 mg Oral Daily  . mometasone-formoterol  2 puff Inhalation BID  . nebivolol  20 mg  Oral Daily  . olopatadine  1 drop Both Eyes BID  . rosuvastatin  10 mg Oral QHS  . sodium chloride flush  3 mL Intravenous Q12H   Continuous Infusions: . sodium chloride       LOS: 4 days    Time spent: over 30 min    Fayrene Helper, MD [facility]   To contact the attending provider between 7A-7P or the covering provider during after hours 7P-7A, please log into the web site [email or web address] and access using universal [place] password for that web site. If you do not have the password, please call the hospital operator.  03/03/2021, 8:49 AM

## 2021-03-04 ENCOUNTER — Other Ambulatory Visit (HOSPITAL_COMMUNITY): Payer: Self-pay

## 2021-03-04 DIAGNOSIS — N179 Acute kidney failure, unspecified: Secondary | ICD-10-CM

## 2021-03-04 DIAGNOSIS — I5033 Acute on chronic diastolic (congestive) heart failure: Secondary | ICD-10-CM

## 2021-03-04 LAB — CBC WITH DIFFERENTIAL/PLATELET
Abs Immature Granulocytes: 0.03 10*3/uL (ref 0.00–0.07)
Basophils Absolute: 0 10*3/uL (ref 0.0–0.1)
Basophils Relative: 0 %
Eosinophils Absolute: 0.2 10*3/uL (ref 0.0–0.5)
Eosinophils Relative: 3 %
HCT: 29.2 % — ABNORMAL LOW (ref 36.0–46.0)
Hemoglobin: 9.3 g/dL — ABNORMAL LOW (ref 12.0–15.0)
Immature Granulocytes: 1 %
Lymphocytes Relative: 9 %
Lymphs Abs: 0.6 10*3/uL — ABNORMAL LOW (ref 0.7–4.0)
MCH: 31.4 pg (ref 26.0–34.0)
MCHC: 31.8 g/dL (ref 30.0–36.0)
MCV: 98.6 fL (ref 80.0–100.0)
Monocytes Absolute: 0.3 10*3/uL (ref 0.1–1.0)
Monocytes Relative: 5 %
Neutro Abs: 5.2 10*3/uL (ref 1.7–7.7)
Neutrophils Relative %: 82 %
Platelets: 162 10*3/uL (ref 150–400)
RBC: 2.96 MIL/uL — ABNORMAL LOW (ref 3.87–5.11)
RDW: 14.1 % (ref 11.5–15.5)
WBC: 6.3 10*3/uL (ref 4.0–10.5)
nRBC: 0 % (ref 0.0–0.2)

## 2021-03-04 LAB — BASIC METABOLIC PANEL
Anion gap: 8 (ref 5–15)
BUN: 23 mg/dL (ref 8–23)
CO2: 36 mmol/L — ABNORMAL HIGH (ref 22–32)
Calcium: 9.2 mg/dL (ref 8.9–10.3)
Chloride: 96 mmol/L — ABNORMAL LOW (ref 98–111)
Creatinine, Ser: 1.26 mg/dL — ABNORMAL HIGH (ref 0.44–1.00)
GFR, Estimated: 47 mL/min — ABNORMAL LOW (ref >60)
Glucose, Bld: 94 mg/dL (ref 70–99)
Potassium: 3.7 mmol/L (ref 3.5–5.1)
Sodium: 140 mmol/L (ref 135–145)

## 2021-03-04 LAB — BRAIN NATRIURETIC PEPTIDE: B Natriuretic Peptide: 272.5 pg/mL — ABNORMAL HIGH (ref 0.0–100.0)

## 2021-03-04 LAB — PHOSPHORUS: Phosphorus: 4.6 mg/dL (ref 2.5–4.6)

## 2021-03-04 LAB — MAGNESIUM: Magnesium: 2.3 mg/dL (ref 1.7–2.4)

## 2021-03-04 MED ORDER — VALSARTAN 40 MG PO TABS
40.0000 mg | ORAL_TABLET | Freq: Every day | ORAL | 11 refills | Status: DC
  Filled 2021-03-04: qty 30, 30d supply, fill #0

## 2021-03-04 MED ORDER — DAPAGLIFLOZIN PROPANEDIOL 10 MG PO TABS
10.0000 mg | ORAL_TABLET | Freq: Every day | ORAL | 2 refills | Status: DC
  Filled 2021-03-04: qty 30, 30d supply, fill #0

## 2021-03-04 MED ORDER — TORSEMIDE 20 MG PO TABS
20.0000 mg | ORAL_TABLET | Freq: Every day | ORAL | 1 refills | Status: DC
  Filled 2021-03-04: qty 30, 30d supply, fill #0

## 2021-03-04 NOTE — TOC Benefit Eligibility Note (Signed)
Transition of Care [facility]) Benefit Eligibility Note    Patient Details  Name: Bridget Lamb MRN: [id number] Date of Birth: 10-Feb-1956   Medication/Dose: Bridget Lamb 10mg . daily (generic brand not approved as of yet)  Covered?: Yes  Tier: 2 Drug  Prescription Coverage Preferred Pharmacy: Bridget Lamb with Person/Company/Phone Number:: Bridget Lamb [facility] PH# [phone]  Co-Pay: $370.46  Prior Approval: No  Deductible: Unmet       Bridget Lamb Phone Number: 03/04/2021, 2:17 PM

## 2021-03-04 NOTE — TOC Progression Note (Signed)
Transition of Care [facility]) - Progression Note    Patient Details  Name: Bridget Lamb MRN: [id number] Date of Birth: September 09, 1956  Transition of [facility]) CM/SW Contact  Royston Bake, RN, MHA,BSN Transition of Care Supervisor Phone Number: [phone] 03/04/2021, 10:49 AM  Clinical Narrative:    Talked to patient at the bedside. Patient lives at home with her grand children ( ages 81yrs and 15yrs old). PCP Dr Joni Reining with [facility]; has private insurance with BCBS with prescription drug coverage; pharmacy of choice is [facility] on [address]; No DME at home. Physical Therapy recommends Outpatient PT; patient does not want this at this time. Informed patient that if she change her mind, her PCP can make arrangement for this from the office. No needs identified at this time. TOC will continue to follow for progression of care.   Expected Discharge Plan: Home/Self Care Barriers to Discharge: No Barriers Identified  Expected Discharge Plan and Services Expected Discharge Plan: Home/Self Care In-house Referral: Clinical Social Work Discharge Planning Services: CM Consult Post Acute Care Choice: NA Living arrangements for the past 2 months: Single Family Home                   DME Agency: NA       HH Arranged: NA           Social Determinants of Health (SDOH) Interventions Food Insecurity Interventions: Assist with SNAP Application Financial Strain Interventions: Other (Comment) (disability and food stamp referral with [place]) Housing Interventions: Intervention Not Indicated Transportation Interventions: Intervention Not Indicated  Readmission Risk Interventions No flowsheet data found.

## 2021-03-04 NOTE — Progress Notes (Signed)
Patient discharged home via wheelchair.  

## 2021-03-04 NOTE — Plan of Care (Signed)

## 2021-03-04 NOTE — Progress Notes (Signed)
D/C instructions given and reviewed. Tele and IV removed, tolerated well. Awaiting TOC pharmacy review.

## 2021-03-04 NOTE — Progress Notes (Addendum)
Subjective:  Patient states that she is feeling much improved.    No chest pain, leg edema is improved but still present. States she has not used O2 anymore.  Intake/Output from previous day:  I/O last 3 completed shifts: In: 840 [P.O.:840] Out: 2800 [Urine:2800] No intake/output data recorded.  Blood pressure 130/62, pulse (!) 57, temperature 98.3 F (36.8 C), resp. rate 17, height $RemoveBe'5\' 4"'khWEdCopT$  (1.626 m), weight 105.7 kg, last menstrual period 10/22/2011, SpO2 93 %.  Vitals with BMI 03/04/2021 03/04/2021 03/03/2021  Height - - -  Weight - 233 lbs 2 oz -  BMI - 36.14 -  Systolic 431 540 086  Diastolic 62 63 40  Pulse 57 60 63   Weight change: -2.066 kg   Intake/Output Summary (Last 24 hours) at 03/04/2021 2035 Last data filed at 03/04/2021 1200 Gross per 24 hour  Intake 600 ml  Output 1400 ml  Net -800 ml      Physical Exam Constitutional:      General: She is not in acute distress.    Appearance: She is obese.  HENT:     Mouth/Throat:     Mouth: Mucous membranes are moist.  Eyes:     Extraocular Movements: Extraocular movements intact.  Neck:     Vascular: No carotid bruit or JVD.  Cardiovascular:     Rate and Rhythm: Normal rate and regular rhythm.     Pulses: Intact distal pulses.     Heart sounds: Normal heart sounds. No murmur heard. No gallop.   Pulmonary:     Effort: Pulmonary effort is normal.     Breath sounds: Wheezing (scattered) and rales (bibasilar) present.  Abdominal:     General: Bowel sounds are normal.     Palpations: Abdomen is soft.     Comments: Obese  Musculoskeletal:        General: Swelling (2 + bilateral pitting edema) present. Normal range of motion.     Cervical back: Normal range of motion.  Skin:    Capillary Refill: Capillary refill takes less than 2 seconds.  Neurological:     General: No focal deficit present.    Lab Results: BMP BNP (last 3 results) Recent Labs    02/28/21 0239 03/03/21 0447 03/04/21 0334  BNP 873.8* 269.7*  272.5*    No results for input(s): PROBNP in the last 8760 hours. BMP Latest Ref Rng & Units 03/04/2021 03/03/2021 03/02/2021  Glucose 70 - 99 mg/dL 94 94 129(H)  BUN 8 - 23 mg/dL 23 30(H) 36(H)  Creatinine 0.44 - 1.00 mg/dL 1.26(H) 1.28(H) 1.33(H)  BUN/Creat Ratio 12 - 28 - - -  Sodium 135 - 145 mmol/L 140 142 141  Potassium 3.5 - 5.1 mmol/L 3.7 3.9 3.6  Chloride 98 - 111 mmol/L 96(L) 101 103  CO2 22 - 32 mmol/L 36(H) 34(H) 32  Calcium 8.9 - 10.3 mg/dL 9.2 9.0 8.7(L)   Hepatic Function Latest Ref Rng & Units 03/02/2021 03/01/2021 02/26/2020  Total Protein 6.5 - 8.1 g/dL 5.9(L) 6.2(L) -  Albumin 3.5 - 5.0 g/dL 3.1(L) 3.3(L) 3.5  AST 15 - 41 U/L 18 18 -  ALT 0 - 44 U/L 17 16 -  Alk Phosphatase 38 - 126 U/L 62 54 -  Total Bilirubin 0.3 - 1.2 mg/dL 0.4 0.8 -   CBC Latest Ref Rng & Units 03/04/2021 03/03/2021 03/02/2021  WBC 4.0 - 10.5 K/uL 6.3 5.5 5.4  Hemoglobin 12.0 - 15.0 g/dL 9.3(L) 9.1(L) 9.3(L)  Hematocrit 36.0 - 46.0 %  29.2(L) 28.9(L) 29.5(L)  Platelets 150 - 400 K/uL 162 139(L) 164   Lipid Panel     Component Value Date/Time   CHOL 117 02/28/2021 0239   TRIG 54 02/28/2021 0239   HDL 49 02/28/2021 0239   CHOLHDL 2.4 02/28/2021 0239   VLDL 11 02/28/2021 0239   LDLCALC 57 02/28/2021 0239   Cardiac Panel (last 3 results) No results for input(s): CKTOTAL, CKMB, TROPONINI, RELINDX in the last 72 hours.  HEMOGLOBIN A1C Lab Results  Component Value Date   HGBA1C 5.4 02/28/2021   MPG 108.28 02/28/2021   TSH No results for input(s): TSH in the last 8760 hours. Imaging:  CT angiogram chest 02/27/2021: Lungs/Pleura: Small pleural effusions with basilar atelectasis. Areas of ground-glass opacity within both lungs. No PE. Cardiomegaly, small pleural effusions and pulmonary edema, likely congestive heart failure.  Chest x-ray PA and lateral view 02/27/2021: Cardiomegaly central venous congestion, interstitial prominence, and small effusions suggest volume overload/congestive heart  failure.   Cardiac Studies:  Echocardiogram: 02/21/2020: 1. Left ventricular ejection fraction, by estimation, is 60 to 65%. The left ventricle has normal function. The left ventricle has no regional wall motion abnormalities. There is mild concentric left ventricular hypertrophy.  Left ventricular diastolic parameters are consistent with Grade II diastolic dysfunction (pseudonormalization).  Elevated left ventricular end-diastolic pressure.  2. Right ventricular systolic function is normal. The right ventricular size is normal. There is mildly elevated pulmonary artery systolic pressure.  3. Left atrial size was moderately dilated.  4. The mitral valve is normal in structure. Trivial mitral valve regurgitation. No evidence of mitral stenosis.  5. The aortic valve was not well visualized. Aortic valve regurgitation is not visualized. No aortic stenosis is present.  6. The inferior vena cava is dilated in size with <50% respiratory variability, suggesting right atrial pressure of 15 mmHg.   Lexiscan Sestamibi stress test 03/10/2020: No previous exam available for comparison. Lexiscan nuclear stress test performed using 1-day protocol. Stress EKG is non-diagnostic, as this is pharmacological stress test. Normal myocardial perfusion. Stress LVEF 59%. Low risk study.  Echocardiogram 03/02/2021:    1. Left ventricular ejection fraction, by estimation, is 55 to 60%. The left ventricle has normal function. Left ventricular endocardial border not optimally defined to evaluate regional wall motion. There is mild left ventricular hypertrophy. Left  ventricular diastolic parameters are consistent with Grade II diastolic dysfunction (pseudonormalization). Elevated left ventricular end-diastolic pressure.  2. Right ventricular systolic function is normal. The right ventricular size is mildly enlarged. Mildly increased right ventricular wall thickness. There is moderately elevated pulmonary artery  systolic pressure.  3. Left atrial size was moderately dilated.  4. Right atrial size was mildly dilated.  5. The mitral valve is normal in structure. Mild to moderate mitral valve regurgitation.  6. Tricuspid valve regurgitation is mild to moderate.  7. The aortic valve is normal in structure. Aortic valve regurgitation is not visualized. No aortic stenosis is present.  8. The inferior vena cava is dilated in size with <50% respiratory variability, suggesting right atrial pressure of 15 mmHg.   EKG: 02/27/2021: Normal sinus rhythm, 77 bpm, normal axis, without underlying ischemia or injury pattern   Scheduled Meds:  Continuous Infusions:  PRN Meds:.  Assessment/Plan:   Bridget Lamb is a 65 y.o. Caucasian female patient with morbid obesity BMI 44, hypertension, restrictive airway disease due to bronchial asthma, hyperlipidemia, HFpEF, OSA and not using CPAP due to feeling claustrophobia. Now admitted with acute decompensated diastolic heart failure.  1.  Acute on chronic diastolic heart failure, symptoms improved and patient now transitioned to oral therapy 2.  Morbid obesity due to excess calories, BMI 40-45 range, with significant serious comorbidity, BMI 40+. Discussed calorie restriction and to weight herself daily.  3.  Restrictive airway disease due to bronchial asthma. 4.  Obstructive sleep apnea on CPAP, states she is compliant and has a nasal cannula for use and does not make her claustrophobic. She is being evaluated for home oxygen needs. 5.  Stage IIIa chronic kidney disease. S. Cr. Stable on diuretics and also [place]. Will start Valsartan 40 mg daily. D/C Amlodipine for HTN. Hope this will help with leg edema as well. 6.  Chronic cor pulmonale and moderate pulmonary hypertension related to #1 and #3, stable 7.  Iron deficiency anemia S/P Feraheme transfusion.  Overall stable for discharge and she has an appointment to see her PCP tomorrow. I will see her in 2 weeks.      Adrian Prows, MD, [facility] 03/04/2021, 8:35 PM Office: [phone] Pager: [phone]

## 2021-03-04 NOTE — Discharge Summary (Signed)
Physician Discharge Summary  Bridget Lamb [id number] DOB: Feb 03, 1956 DOA: 02/27/2021  PCP: [name], PA-C  Admit date: 02/27/2021 Discharge date: 03/04/2021  Time spent: 50 minutes  Recommendations for Outpatient Follow-up:  1. Follow-up cardiology as outpatient 2. Follow-up PCP in 2 weeks  Discharge Diagnoses:  Active Problems:   Acute on chronic diastolic (congestive) heart failure (HCC)   Hypoxia   Acute on chronic congestive heart failure (HCC)   AKI (acute kidney injury) (HCC)   Benign hypertension with CKD (chronic kidney disease) stage III (HCC)   Mixed hyperlipidemia   Atherosclerosis of aorta ([place])   Discharge Condition: Stable  Diet recommendation: Heart healthy diet  Filed Weights   03/02/21 0800 03/03/21 0507 03/04/21 0532  Weight: 107.8 kg 107.2 kg 105.7 kg    History of present illness:  65 year old female with history of chronic diastolic heart failure, asthma, hyperlipidemia, hypertension, endometrial/cervical cancer who presented to ED for worsening shortness of breath.  She was admitted with acute hypoxemic respiratory failure due to HFpEF with exacerbation.  Hospital Course:  Acute hypoxemic respiratory failure-patient admitted with acute hypoxemic respiratory failure due to HFpEF exacerbation.  Chest CT PE protocol at presentation showed no pulmonary embolism.  It only was notable for small effusion and pulmonary edema likely CHF.  Patient was started on Lasix, Farxiga per cardiology.  She also was started on Entresto.  Echocardiogram was notable for EF of 55 to 60%, mild LVH, grade 2 diastolic dysfunction, moderately elevated PASP.  Cardiology has made changes in her diuretic regimen.  We will discharge patient on torsemide 20 mg p.o. daily.  Also started on valsartan 40 mg p.o. daily.  Acute kidney injury-patient baseline creatinine around 1.2.  She was started on diuresis with Lasix.  Creatinine today 1.28.  Started on valsartan as above.   Follow BMP as outpatient.  Hypertension-start back on nebivolol, also started on valsartan.  Will discontinue amlodipine.  Anemia-likely anemia of chronic disease-she also had borderline low iron.  Received 1 dose of IV Feraheme 510 mg x 1.  Serum iron level is 34.  Will not give p.o. supplementation at this time.  Bronchial asthma-continue bronchodilators as needed  History of endometrial adenocarcinoma-stable for outpatient follow-up.  She will follow-up with radiation oncology/GYN oncology.  Currently she is under observation.  Hypothyroidism-continue Synthroid  Procedures:    Consultations:  Cardiology  Discharge Exam: Vitals:   03/04/21 0821 03/04/21 1130  BP:  130/62  Pulse:  (!) 57  Resp:  17  Temp:  98.3 F (36.8 C)  SpO2: 96% 93%    General-appears in no acute distress Heart-S1-S2, regular, no murmur auscultated Lungs-clear to auscultation bilaterally, no wheezing or crackles auscultated Abdomen-soft, nontender, no organomegaly Extremities-no edema in the lower extremities Neuro-alert, oriented x3, no focal deficit noted  Discharge Instructions   Discharge Instructions    Diet - low sodium heart healthy   Complete by: As directed    Increase activity slowly   Complete by: As directed      Allergies as of 03/04/2021      Reactions   Procardia [nifedipine] Other (See Comments)   Ineffective       Medication List    STOP taking these medications   amLODipine 5 MG tablet Commonly known as: NORVASC   Integra Plus Caps     TAKE these medications   albuterol 108 (90 Base) MCG/ACT inhaler Commonly known as: VENTOLIN HFA Inhale 2 puffs into the lungs every 4 (four) hours as needed  for wheezing or shortness of breath.   aspirin EC 81 MG tablet Take 81 mg by mouth daily.   CALCIUM 600+D PO Take 1 tablet by mouth 2 (two) times daily.   dapagliflozin propanediol 10 MG Tabs tablet Commonly known as: FARXIGA Take 1 tablet (10 mg total) by mouth  daily. Start taking on: Mar 05, 2021   Dulera 100-5 MCG/ACT Aero Generic drug: mometasone-formoterol Inhale 2 puffs into the lungs in the morning and at bedtime.   levothyroxine 150 MCG tablet Commonly known as: SYNTHROID Take 150 mcg by mouth daily before breakfast.   loratadine 10 MG tablet Commonly known as: CLARITIN Take 10 mg by mouth daily.   Nebivolol HCl 20 MG Tabs Take 1 tablet by mouth once daily   olopatadine 0.1 % ophthalmic solution Commonly known as: PATANOL Place 1 drop into both eyes 2 (two) times daily.   rosuvastatin 10 MG tablet Commonly known as: CRESTOR Take 10 mg by mouth at bedtime.   senna-docusate 8.6-50 MG tablet Commonly known as: Senokot-S Take 1 tablet by mouth 2 (two) times daily as needed for moderate constipation.   torsemide 20 MG tablet Commonly known as: DEMADEX Take 1 tablet (20 mg total) by mouth daily. Start taking on: Mar 05, 2021   valsartan 40 MG tablet Commonly known as: Diovan Take 1 tablet (40 mg total) by mouth daily.      Allergies  Allergen Reactions  . Procardia [Nifedipine] Other (See Comments)    Ineffective       The results of significant diagnostics from this hospitalization (including imaging, microbiology, ancillary and laboratory) are listed below for reference.    Significant Diagnostic Studies: DG Chest 2 View  Result Date: 02/27/2021 CLINICAL DATA:  sob and wheezing ,hx chf,asthmachf/sob EXAM: CHEST - 2 VIEW COMPARISON:  Radiograph 02/24/2020 FINDINGS: Stable enlarged cardiac silhouette. There is central venous congestion in mild peripheral interstitial markings increased from prior. Small effusions. No focal consolidation. Degenerative osteophytosis of the spine. IMPRESSION: Cardiomegaly central venous congestion, interstitial prominence, and small effusions suggest volume overload/congestive heart failure. Electronically Signed   By: Suzy Bouchard M.D.   On: 02/27/2021 10:19   CT Angio Chest PE W  and/or Wo Contrast  Result Date: 02/27/2021 CLINICAL DATA:  Shortness of breath EXAM: CT ANGIOGRAPHY CHEST WITH CONTRAST TECHNIQUE: Multidetector CT imaging of the chest was performed using the standard protocol during bolus administration of intravenous contrast. Multiplanar CT image reconstructions and MIPs were obtained to evaluate the vascular anatomy. CONTRAST:  64mL OMNIPAQUE IOHEXOL 350 MG/ML SOLN COMPARISON:  None. FINDINGS: Cardiovascular: Contrast injection is sufficient to demonstrate satisfactory opacification of the pulmonary arteries to the segmental level. There is no pulmonary embolus or evidence of right heart strain. The size of the main pulmonary artery is normal. Heart is enlarged. The course and caliber of the aorta are normal. There is mild atherosclerotic calcification. Opacification decreased due to pulmonary arterial phase contrast bolus timing. Mediastinum/Nodes: No mediastinal, hilar or axillary lymphadenopathy. Normal visualized thyroid. Thoracic esophageal course is normal. Lungs/Pleura: Small pleural effusions with basilar atelectasis. Areas of ground-glass opacity within both lungs. Upper Abdomen: Contrast bolus timing is not optimized for evaluation of the abdominal organs. The visualized portions of the organs of the upper abdomen are normal. Musculoskeletal: No chest wall abnormality. No bony spinal canal stenosis. Review of the MIP images confirms the above findings. IMPRESSION: 1. No pulmonary embolus or acute aortic syndrome. 2. Cardiomegaly, small pleural effusions and pulmonary edema, likely congestive heart failure. Aortic  atherosclerosis (ICD10-I70.0). Electronically Signed   By: Ulyses Jarred M.D.   On: 02/27/2021 20:58   ECHOCARDIOGRAM COMPLETE  Result Date: 03/02/2021    ECHOCARDIOGRAM REPORT   Patient Name:   Bridget Lamb Date of Exam: 03/01/2021 Medical Rec #:  [id number]           Height:       64.0 in Accession #:    [id number]          Weight:       241.4 lb  Date of Birth:  04/04/1956           BSA:          2.119 m Patient Age:    51 years            BP:           122/58 mmHg Patient Gender: F                   HR:           57 bpm. Exam Location:  Inpatient Procedure: 2D Echo Indications:     chest pain  History:         Patient has prior history of Echocardiogram examinations, most                  recent 02/21/2020. CHF; Risk Factors:Hypertension, Dyslipidemia                  and Sleep Apnea.  Sonographer:     Johny Chess RDCS Referring Phys:  [place] Diagnosing Phys: Adrian Prows MD  Sonographer Comments: Patient is morbidly obese. Image acquisition challenging due to patient body habitus. IMPRESSIONS  1. Left ventricular ejection fraction, by estimation, is 55 to 60%. The left ventricle has normal function. Left ventricular endocardial border not optimally defined to evaluate regional wall motion. There is mild left ventricular hypertrophy. Left ventricular diastolic parameters are consistent with Grade II diastolic dysfunction (pseudonormalization). Elevated left ventricular end-diastolic pressure.  2. Right ventricular systolic function is normal. The right ventricular size is mildly enlarged. Mildly increased right ventricular wall thickness. There is moderately elevated pulmonary artery systolic pressure.  3. Left atrial size was moderately dilated.  4. Right atrial size was mildly dilated.  5. The mitral valve is normal in structure. Mild to moderate mitral valve regurgitation.  6. Tricuspid valve regurgitation is mild to moderate.  7. The aortic valve is normal in structure. Aortic valve regurgitation is not visualized. No aortic stenosis is present.  8. The inferior vena cava is dilated in size with <50% respiratory variability, suggesting right atrial pressure of 15 mmHg. FINDINGS  Left Ventricle: Left ventricular ejection fraction, by estimation, is 55 to 60%. The left ventricle has normal function. Left ventricular endocardial border  not optimally defined to evaluate regional wall motion. The left ventricular internal cavity size was normal in size. There is mild left ventricular hypertrophy. Left ventricular diastolic parameters are consistent with Grade II diastolic dysfunction (pseudonormalization). Elevated left ventricular end-diastolic pressure. Right Ventricle: The right ventricular size is mildly enlarged. Mildly increased right ventricular wall thickness. Right ventricular systolic function is normal. There is moderately elevated pulmonary artery systolic pressure. The tricuspid regurgitant velocity is 2.87 m/s, and with an assumed right atrial pressure of 15 mmHg, the estimated right ventricular systolic pressure is [id number] mmHg. Left Atrium: Left atrial size was moderately dilated. Right Atrium: Right atrial size was mildly dilated. Pericardium: There is no evidence  of pericardial effusion. Mitral Valve: The mitral valve is normal in structure. Mild to moderate mitral valve regurgitation. Tricuspid Valve: The tricuspid valve is normal in structure. Tricuspid valve regurgitation is mild to moderate. Aortic Valve: The aortic valve is normal in structure. Aortic valve regurgitation is not visualized. No aortic stenosis is present. Aortic valve mean gradient measures 10.0 mmHg. Aortic valve peak gradient measures 18.5 mmHg. Aortic valve area, by VTI measures 1.69 cm. Pulmonic Valve: The pulmonic valve was grossly normal. Pulmonic valve regurgitation is trivial. Aorta: The aortic root is normal in size and structure. Venous: The inferior vena cava is dilated in size with less than 50% respiratory variability, suggesting right atrial pressure of 15 mmHg. IAS/Shunts: No atrial level shunt detected by color flow Doppler.  LEFT VENTRICLE PLAX 2D LVIDd:         5.20 cm  Diastology LVIDs:         3.40 cm  LV e' medial:    7.40 cm/s LV PW:         1.00 cm  LV E/e' medial:  22.0 LV IVS:        0.90 cm  LV e' lateral:   7.51 cm/s LVOT diam:     1.80  cm  LV E/e' lateral: 21.7 LV SV:         89 LV SV Index:   42 LVOT Area:     2.54 cm  RIGHT VENTRICLE             IVC RV S prime:     12.70 cm/s  IVC diam: 2.80 cm TAPSE (M-mode): 2.5 cm LEFT ATRIUM             Index       RIGHT ATRIUM           Index LA diam:        4.10 cm 1.93 cm/m  RA Area:     18.30 cm LA Vol (A2C):   93.2 ml 43.98 ml/m RA Volume:   48.10 ml  22.70 ml/m LA Vol (A4C):   74.0 ml 34.92 ml/m LA Biplane Vol: 87.2 ml 41.15 ml/m  AORTIC VALVE AV Area (Vmax):    1.66 cm AV Area (Vmean):   1.63 cm AV Area (VTI):     1.69 cm AV Vmax:           215.00 cm/s AV Vmean:          146.000 cm/s AV VTI:            0.525 m AV Peak Grad:      18.5 mmHg AV Mean Grad:      10.0 mmHg LVOT Vmax:         140.00 cm/s LVOT Vmean:        93.800 cm/s LVOT VTI:          0.349 m LVOT/AV VTI ratio: 0.66  AORTA Ao Root diam: 2.70 cm Ao Asc diam:  3.20 cm MITRAL VALVE                TRICUSPID VALVE MV Area (PHT): 3.85 cm     TR Peak grad:   32.9 mmHg MV Decel Time: 197 msec     TR Vmax:        287.00 cm/s MV E velocity: 163.00 cm/s MV A velocity: 125.00 cm/s  SHUNTS MV E/A ratio:  1.30         Systemic VTI:  0.35 m  Systemic Diam: 1.80 cm Adrian Prows MD Electronically signed by Adrian Prows MD Signature Date/Time: 03/02/2021/11:55:33 AM    Final     Microbiology: Recent Results (from the past 240 hour(s))  Resp Panel by RT-PCR (Flu A&B, Covid) Nasopharyngeal Swab     Status: None   Collection Time: 02/27/21  6:24 PM   Specimen: Nasopharyngeal Swab; Nasopharyngeal(NP) swabs in vial transport medium  Result Value Ref Range Status   SARS Coronavirus 2 by RT PCR NEGATIVE NEGATIVE Final    Comment: (NOTE) SARS-CoV-2 target nucleic acids are NOT DETECTED.  The SARS-CoV-2 RNA is generally detectable in upper respiratory specimens during the acute phase of infection. The lowest concentration of SARS-CoV-2 viral copies this assay can detect is 138 copies/mL. A negative result does not  preclude SARS-Cov-2 infection and should not be used as the sole basis for treatment or other patient management decisions. A negative result may occur with  improper specimen collection/handling, submission of specimen other than nasopharyngeal swab, presence of viral mutation(s) within the areas targeted by this assay, and inadequate number of viral copies(<138 copies/mL). A negative result must be combined with clinical observations, patient history, and epidemiological information. The expected result is Negative.  Fact Sheet for Patients:  EntrepreneurPulse.com.au  Fact Sheet for Healthcare Providers:  IncredibleEmployment.be  This test is no t yet approved or cleared by the [place] FDA and  has been authorized for detection and/or diagnosis of SARS-CoV-2 by FDA under an Emergency Use Authorization (EUA). This EUA will remain  in effect (meaning this test can be used) for the duration of the COVID-19 declaration under Section 564(b)(1) of the Act, 21 U.S.C.section 360bbb-3(b)(1), unless the authorization is terminated  or revoked sooner.       Influenza A by PCR NEGATIVE NEGATIVE Final   Influenza B by PCR NEGATIVE NEGATIVE Final    Comment: (NOTE) The Xpert Xpress SARS-CoV-2/FLU/RSV plus assay is intended as an aid in the diagnosis of influenza from Nasopharyngeal swab specimens and should not be used as a sole basis for treatment. Nasal washings and aspirates are unacceptable for Xpert Xpress SARS-CoV-2/FLU/RSV testing.  Fact Sheet for Patients: EntrepreneurPulse.com.au  Fact Sheet for Healthcare Providers: IncredibleEmployment.be  This test is not yet approved or cleared by the [place] FDA and has been authorized for detection and/or diagnosis of SARS-CoV-2 by FDA under an Emergency Use Authorization (EUA). This EUA will remain in effect (meaning this test can be used) for the  duration of the COVID-19 declaration under Section 564(b)(1) of the Act, 21 U.S.C. section 360bbb-3(b)(1), unless the authorization is terminated or revoked.  Performed at [facility], [place] [address], [place], [place] [address]      Labs: Basic Metabolic Panel: Recent Labs  Lab 02/28/21 0239 03/01/21 0149 03/02/21 0829 03/03/21 0447 03/04/21 0334  NA 141 141 141 142 140  K 4.1 3.5 3.6 3.9 3.7  CL 103 102 103 101 96*  CO2 27 32 32 34* 36*  GLUCOSE 136* 91 129* 94 94  BUN 30* 47* 36* 30* 23  CREATININE 1.17* 1.51* 1.33* 1.28* 1.26*  CALCIUM 9.2 8.9 8.7* 9.0 9.2  MG  --  2.1 2.2 2.3 2.3  PHOS  --  3.2  --  4.2 4.6   Liver Function Tests: Recent Labs  Lab 03/01/21 0149 03/02/21 0829  AST 18 18  ALT 16 17  ALKPHOS 54 62  BILITOT 0.8 0.4  PROT 6.2* 5.9*  ALBUMIN 3.3* 3.1*   No results for  input(s): LIPASE, AMYLASE in the last 168 hours. No results for input(s): AMMONIA in the last 168 hours. CBC: Recent Labs  Lab 02/27/21 1822 03/01/21 0149 03/02/21 0829 03/03/21 0447 03/04/21 0334  WBC 5.6 9.8 5.4 5.5 6.3  NEUTROABS  --  8.6* 4.6 4.5 5.2  HGB 9.5* 9.1* 9.3* 9.1* 9.3*  HCT 31.2* 28.6* 29.5* 28.9* 29.2*  MCV 104.3* 97.9 99.3 98.6 98.6  PLT 120* 145* 164 139* 162   Cardiac Enzymes: No results for input(s): CKTOTAL, CKMB, CKMBINDEX, TROPONINI in the last 168 hours. BNP: BNP (last 3 results) Recent Labs    02/28/21 0239 03/03/21 0447 03/04/21 0334  BNP 873.8* 269.7* 272.5*        Signed:  Oswald Hillock MD.  [facility] 03/04/2021, 11:49 AM

## 2021-03-04 NOTE — Progress Notes (Signed)
Heart Failure Nurse Navigator Progress Note  Pt declined HV TOC clinic. Briefly educated on HF dietary/fluid modifications and importance of medication compliance. Pt has follow up scheduled for 6/8 with [facility].   Pricilla Holm, RN, BSN Heart Failure Nurse Navigator [phone]

## 2021-03-04 NOTE — Progress Notes (Signed)
Heart Failure Stewardship Pharmacist Progress Note   PCP: [name], PA-C PCP-Cardiologist: None    HPI:  65 yo F with obesity, HTN, restrictive airway disease, HLD, HFpEF, and OSA. She presented to the ED on 02/27/21 with acute decompensated diastolic heart failure. Chest xray on 5/6 showed cardiomegaly with small effusions suggestive of volume overload. An ECHO was done on 03/01/21 and LVEF was 55-60%. Concern for dietary and medication noncompliance.   Current HF Medications: Torsemide 20 mg daily Farxiga 10 mg daily  Prior to admission HF Medications: Torsemide 20 mg daily  Pertinent Lab Values: . Serum creatinine 1.26, BUN 23, Potassium 3.7, Sodium 140, BNP 873.8, Magnesium 2.3   Vital Signs: . Weight: 233 lbs (admission weight: 246 lbs) . Blood pressure: 130/60s  . Heart rate: 50s   Medication Assistance / Insurance Benefits Check: Does the patient have prescription insurance?  Yes Type of insurance plan: [employer]  Outpatient Pharmacy:  Prior to admission outpatient pharmacy: [facility] Is the patient willing to use [facility] at discharge? Yes Is the patient willing to transition their outpatient pharmacy to utilize a [facility] outpatient pharmacy?   Pending    Assessment: 1. Acute on chronic diastolic CHF (EF 63-84%), due to dietary and medication noncompliance. NYHA class II symptoms. - Continue torsemide 20 mg daily - Continue Farxiga 10 mg daily - Consider starting Entresto at hospital follow up appt   Plan: 1) Medication changes recommended at this time: - Discharge today  2) Patient assistance: - Insurance will only cover prescriptions at [employer] or [employer] (unable to obtain copay information)  Kerby Nora, PharmD, BCPS Heart Failure Cytogeneticist Phone [phone]

## 2021-03-05 ENCOUNTER — Other Ambulatory Visit: Payer: Self-pay | Admitting: Physician Assistant

## 2021-03-05 DIAGNOSIS — Z1231 Encounter for screening mammogram for malignant neoplasm of breast: Secondary | ICD-10-CM

## 2021-03-26 ENCOUNTER — Other Ambulatory Visit (HOSPITAL_COMMUNITY): Payer: Self-pay

## 2021-04-01 NOTE — Progress Notes (Signed)
Primary Physician/Referring:  Teena Irani, PA-C  Patient ID: Bridget Lamb, female    DOB: 04-19-1956, 65 y.o.   MRN: [id number]  Chief Complaint  Patient presents with   Acute on chronic diastolic heart failure    HPI:    Bridget Lamb  is a 65 y.o. female with morbid obesity BMI 44, hypertension, restrictive airway disease due to bronchial asthma, hyperlipidemia, HFpEF, OSA now compliant with CPAP.  Patient was admitted to ICU at the Hospital on 02/26/2020 for acute respiratory failure due to acute pulmonary edema in the setting of uncontrolled hypertension and diastolic CHF exacerbation, she has relatively recuperated well.   Patient was again admitted to the hospital 02/27/2021 - 03/04/2021 with acute on chronic diastolic heart failure.  Prior to presentation to the hospital patient had only been taking torsemide once daily, instead of twice daily as directed.  Patient diuresed well and shortness of breath resolved.  Patient was started on Farxiga and valsartan, and subsequently discharged on torsemide 20 mg once daily.  Patient reports she was seen by her PCP last week who did repeat lab work.  His labs are unavailable for review, however according to patient renal function was stable.  She has also seen her nephrologist since discharge from the hospital who agreed with changes that were made during hospitalization.  Patient reports since discharge she has been making an effort to eat less salt and processed foods as well as lose weight, however this continues to be a struggle for her.  She also reports she is working on walking on her treadmill more frequently, however injured her ankle last week which has limited her physical activity.  Patient reports her dyspnea has significantly improved since hospitalization.  Denies chest pain, palpitations, syncope, near syncope, orthopnea, PND.  Patient has chronic bilateral lower leg edema for which she wears support stockings.  Edema has  improved since discharge from the hospital, presently stable.  Discharge recommendations vs. Dr. Jacinto Halim 03/04/21:  1.  Acute on chronic diastolic heart failure, symptoms improved and patient now transitioned to oral therapy 2.  Morbid obesity due to excess calories, BMI 40-45 range, with significant serious comorbidity, BMI 40+. Discussed calorie restriction and to weight herself daily.  3.  Restrictive airway disease due to bronchial asthma. 4.  Obstructive sleep apnea on CPAP, states she is compliant and has a nasal cannula for use and does not make her claustrophobic. She is being evaluated for home oxygen needs. 5.  Stage IIIa chronic kidney disease. S. Cr. Stable on diuretics and also [place]. Will start Valsartan 40 mg daily. D/C Amlodipine for HTN. Hope this will help with leg edema as well. 6.  Chronic cor pulmonale and moderate pulmonary hypertension related to #1 and #3, stable 7.  Iron deficiency anemia S/P Feraheme transfusion.  Past Medical History:  Diagnosis Date   Allergic rhinitis    Anemia    hematology, Dr. Arbutus Ped, last 01/2011   Asthma    Cervix cancer (HCC)    CHF (congestive heart failure) (HCC) 2004   Chronic kidney disease    protein in urine   Complication of anesthesia    Dyslipidemia    Dyspnea    on excertion   Endometrial cancer (HCC)    H/O echocardiogram 01/07/04   mild LVH, nl LV systolic function, EF 60%, left atrial enlargement, mildly thickened aortic and mitral valves   History of radiation therapy 8/4-21-07/24/20   Pelvis IMRT and HDR, Dr. Antony Blackbird  History of thyroid cancer    Hypertension    Incarcerated ventral hernia 10/07/2013   Obesity, Class III, BMI 40-49.9 (morbid obesity) ([facility]) 10/09/2013   Osteopenia    PONV (postoperative nausea and vomiting)    Sleep apnea    Umbilical hernia    Unspecified essential hypertension 10/09/2013   Unspecified hypothyroidism 10/09/2013   Past Surgical History:  Procedure Laterality Date   BONE  MARROW BIOPSY     CARDIAC CATHETERIZATION  2005   CHOLECYSTECTOMY  2003   COLONOSCOPY  2008   ROBOTIC ASSISTED TOTAL HYSTERECTOMY WITH BILATERAL SALPINGO OOPHERECTOMY N/A 10/25/2019   Procedure: XI ROBOTIC ASSISTED TOTAL HYSTERECTOMY WITH BILATERAL SALPINGO OOPHORECTOMY;  Surgeon: Lafonda Mosses, MD;  Location: [facility];  Service: Gynecology;  Laterality: N/A;   SENTINEL NODE BIOPSY N/A 10/25/2019   Procedure: SENTINEL LYMPH NODE BIOPSY;  Surgeon: Lafonda Mosses, MD;  Location: [facility];  Service: Gynecology;  Laterality: N/A;   THYROID SURGERY     partial thyroidectomy   TOTAL THYROIDECTOMY  2009   VENTRAL HERNIA REPAIR N/A 10/07/2013   Procedure: HERNIA REPAIR VENTRAL ADULT;  Surgeon: Gwenyth Ober, MD;  Location: [place];  Service: General;  Laterality: N/A;   Family History  Problem Relation Age of Onset   Hypertension Mother    Pancreatic cancer Mother    Breast cancer Maternal Aunt    Lung cancer Father    Ovarian cancer Neg Hx    Endometrial cancer Neg Hx    Colon cancer Neg Hx     Social History   Tobacco Use   Smoking status: Never   Smokeless tobacco: Never  Substance Use Topics   Alcohol use: No   Marital Status: Divorced  ROS  Review of Systems  Constitutional: Negative for malaise/fatigue and weight gain.  Cardiovascular:  Positive for dyspnea on exertion (chronic, stable. Improved since discharge) and leg swelling (minimal). Negative for chest pain, claudication, near-syncope, palpitations, paroxysmal nocturnal dyspnea and syncope.  Respiratory:  Positive for snoring (has sleep apnea unable to wear CPAP). Negative for shortness of breath (improved).   Hematologic/Lymphatic: Does not bruise/bleed easily.  Musculoskeletal:  Positive for joint pain (knee and hips chronic).  Gastrointestinal:  Negative for melena.  Neurological:  Negative for dizziness and weakness.  Objective  Blood pressure (!) 134/54, pulse 73, temperature 98.2 F (36.8 C), height $RemoveBe'5\' 4"'YaXxQhgOi$   (1.626 m), weight 235 lb (106.6 kg), last menstrual period 10/22/2011, SpO2 99 %.  Vitals with BMI 04/02/2021 03/04/2021 03/04/2021  Height $Remov'5\' 4"'MdAXha$  - -  Weight 235 lbs - 233 lbs 2 oz  BMI 82.50 - 53.97  Systolic 673 419 379  Diastolic 54 62 63  Pulse 73 57 60     Physical Exam Vitals reviewed.  Constitutional:      General: She is not in acute distress.    Appearance: She is well-developed.     Comments: Morbidly obese  Cardiovascular:     Rate and Rhythm: Normal rate and regular rhythm.     Pulses: Intact distal pulses.     Heart sounds: S1 normal and S2 normal. Murmur heard.  Early systolic murmur is present with a grade of 1/6 at the apex.  Harsh mid to late systolic murmur is also present at the upper right sternal border.    No gallop.     Comments: Wearing support stockings. No JVD  Pulmonary:     Effort: Pulmonary effort is normal. No accessory muscle usage or respiratory distress.  Breath sounds: Normal breath sounds. No wheezing, rhonchi or rales.  Abdominal:     Comments: Pannus present  Musculoskeletal:        General: No tenderness.     Right lower leg: Edema (trace) present.     Left lower leg: Edema (trace) present.  Skin:    General: Skin is warm and dry.  Neurological:     Mental Status: She is alert.   Laboratory examination:   Recent Labs    05/01/20 1420 05/05/20 0809 05/20/20 0931 02/27/21 0955 03/02/21 0829 03/03/21 0447 03/04/21 0334  NA 142 139 140   < > 141 142 140  K 5.2* 5.2* 5.2   < > 3.6 3.9 3.7  CL 103 102 99   < > 103 101 96*  CO2 $Re'28 27 26   'onb$ < > 32 34* 36*  GLUCOSE 94 95 101*   < > 129* 94 94  BUN 38* 51* 31*   < > 36* 30* 23  CREATININE 1.72* 1.93* 1.49*   < > 1.33* 1.28* 1.26*  CALCIUM 10.3 9.7 10.0   < > 8.7* 9.0 9.2  GFRNONAA 31* 27* 37*   < > 44* 46* 47*  GFRAA 36* 31* 42*  --   --   --   --    < > = values in this interval not displayed.   CrCl cannot be calculated (Patient's most recent lab result is older than the maximum  21 days allowed.).  CMP Latest Ref Rng & Units 03/04/2021 03/03/2021 03/02/2021  Glucose 70 - 99 mg/dL 94 94 129(H)  BUN 8 - 23 mg/dL 23 30(H) 36(H)  Creatinine 0.44 - 1.00 mg/dL 1.26(H) 1.28(H) 1.33(H)  Sodium 135 - 145 mmol/L 140 142 141  Potassium 3.5 - 5.1 mmol/L 3.7 3.9 3.6  Chloride 98 - 111 mmol/L 96(L) 101 103  CO2 22 - 32 mmol/L 36(H) 34(H) 32  Calcium 8.9 - 10.3 mg/dL 9.2 9.0 8.7(L)  Total Protein 6.5 - 8.1 g/dL - - 5.9(L)  Total Bilirubin 0.3 - 1.2 mg/dL - - 0.4  Alkaline Phos 38 - 126 U/L - - 62  AST 15 - 41 U/L - - 18  ALT 0 - 44 U/L - - 17   CBC Latest Ref Rng & Units 03/04/2021 03/03/2021 03/02/2021  WBC 4.0 - 10.5 K/uL 6.3 5.5 5.4  Hemoglobin 12.0 - 15.0 g/dL 9.3(L) 9.1(L) 9.3(L)  Hematocrit 36.0 - 46.0 % 29.2(L) 28.9(L) 29.5(L)  Platelets 150 - 400 K/uL 162 139(L) 164   TSH No results for input(s): TSH in the last 8760 hours. BNP (last 3 results) Recent Labs    02/28/21 0239 03/03/21 0447 03/04/21 0334  BNP 873.8* 269.7* 272.5*   Magnesium:  Recent Labs    03/02/21 0829 03/03/21 0447 03/04/21 0334  MG 2.2 2.3 2.3    External labs:  03/16/2021: NT proBNP 1054 Glucose 96, BUN 29, creatinine 1.53, GFR 30, sodium 142, potassium 4.9, AST 16, ALT 13, alk phos 99  12/15/2020: NT proBNP 820 Glucose 96, BUN 37, creatinine 1.52, GFR 36, sodium 146, potassium 4.4  12/09/2020: proBNP 1805 Total cholesterol 120, triglycerides 82, HDL 48, LDL 56, non-HDL 72 Glucose 100, BUN 25, creatinine 1.5, GFR 36, sodium 144, potassium 4.6  08/28/2020: Creatinine 1.27, EGFR 44, BUN 19, sodium 146, potassium 4.3  07/17/2020: BUN 15, creatinine 1.28, EGFR 44 mL.  Potassium 3.8, sodium 142. TSH 7.220, mildly elevated.  04/14/2020:  Serum glucose 95 mg, BUN 35, creatinine 1.47, EGFR 37  mL.  02/22/20:  Vitamin B12 347. Iron 26 (L), TIBC 288, Saturation Ratios 9 (L), UIBC 262.    12/05/2019:  Total cholesterol 158, triglycerides 102, HDL 53, LDL 86.   Allergies   Allergies   Allergen Reactions   Procardia [Nifedipine] Other (See Comments)    Ineffective       Medications Prior to Visit:   Outpatient Medications Prior to Visit  Medication Sig Dispense Refill   albuterol (PROVENTIL HFA;VENTOLIN HFA) 108 (90 Base) MCG/ACT inhaler Inhale 2 puffs into the lungs every 4 (four) hours as needed for wheezing or shortness of breath. 1 Inhaler 0   aspirin EC 81 MG tablet Take 81 mg by mouth daily.     Calcium Carbonate-Vitamin D (CALCIUM 600+D PO) Take 1 tablet by mouth 2 (two) times daily.     dapagliflozin propanediol (FARXIGA) 10 MG TABS tablet Take 1 tablet (10 mg total) by mouth daily. 30 tablet 2   DULERA 100-5 MCG/ACT AERO Inhale 2 puffs into the lungs in the morning and at bedtime.      FeFum-FePoly-FA-B Cmp-C-Biot (INTEGRA PLUS) CAPS Take 1 capsule by mouth daily.     levothyroxine (SYNTHROID, LEVOTHROID) 150 MCG tablet Take 150 mcg by mouth daily before breakfast.      loratadine (CLARITIN) 10 MG tablet Take 10 mg by mouth daily.     Nebivolol HCl 20 MG TABS Take 1 tablet by mouth once daily 90 tablet 0   olopatadine (PATANOL) 0.1 % ophthalmic solution Place 1 drop into both eyes 2 (two) times daily.      rosuvastatin (CRESTOR) 10 MG tablet Take 10 mg by mouth at bedtime.     senna-docusate (SENOKOT-S) 8.6-50 MG tablet Take 1 tablet by mouth 2 (two) times daily as needed for moderate constipation.     torsemide (DEMADEX) 20 MG tablet Take 1 tablet (20 mg total) by mouth daily. 30 tablet 1   valsartan (DIOVAN) 40 MG tablet Take 1 tablet (40 mg total) by mouth daily. 30 tablet 11   No facility-administered medications prior to visit.     Final Medications at End of Visit    Current Meds  Medication Sig   albuterol (PROVENTIL HFA;VENTOLIN HFA) 108 (90 Base) MCG/ACT inhaler Inhale 2 puffs into the lungs every 4 (four) hours as needed for wheezing or shortness of breath.   aspirin EC 81 MG tablet Take 81 mg by mouth daily.   Calcium Carbonate-Vitamin D  (CALCIUM 600+D PO) Take 1 tablet by mouth 2 (two) times daily.   dapagliflozin propanediol (FARXIGA) 10 MG TABS tablet Take 1 tablet (10 mg total) by mouth daily.   DULERA 100-5 MCG/ACT AERO Inhale 2 puffs into the lungs in the morning and at bedtime.    FeFum-FePoly-FA-B Cmp-C-Biot (INTEGRA PLUS) CAPS Take 1 capsule by mouth daily.   levothyroxine (SYNTHROID, LEVOTHROID) 150 MCG tablet Take 150 mcg by mouth daily before breakfast.    loratadine (CLARITIN) 10 MG tablet Take 10 mg by mouth daily.   Nebivolol HCl 20 MG TABS Take 1 tablet by mouth once daily   olopatadine (PATANOL) 0.1 % ophthalmic solution Place 1 drop into both eyes 2 (two) times daily.    rosuvastatin (CRESTOR) 10 MG tablet Take 10 mg by mouth at bedtime.   senna-docusate (SENOKOT-S) 8.6-50 MG tablet Take 1 tablet by mouth 2 (two) times daily as needed for moderate constipation.   torsemide (DEMADEX) 20 MG tablet Take 1 tablet (20 mg total) by mouth daily.   valsartan (  DIOVAN) 40 MG tablet Take 1 tablet (40 mg total) by mouth daily.     Radiology:   Chest x-ray 02/27/2021: Stable enlarged cardiac silhouette. There is central venous congestion in mild peripheral interstitial markings increased from prior. Small effusions. No focal consolidation. Degenerative osteophytosis of the spine.   IMPRESSION: Cardiomegaly central venous congestion, interstitial prominence, and small effusions suggest volume overload/congestive heart failure.  CT angio chest PE 02/27/2021: 1. No pulmonary embolus or acute aortic syndrome. 2. Cardiomegaly, small pleural effusions and pulmonary edema, likely congestive heart failure. Aortic atherosclerosis (ICD10-I70.0).   CT angiography chest with contrast 02/20/2020: 1. No evidence for acute pulmonary embolus. 2. Patchy ground-glass and consolidative opacities throughout the aerated lungs bilaterally which may represent edema or infection. Additionally, there are scattered areas of consolidation  within the lungs bilaterally which may represent atelectasis or infection. Consider follow-up chest CT in 6-8 weeks after resolution of acute symptomatology to ensure the nodularity resolves. 3. Moderate bilateral pleural effusions. 4. Prominent mediastinal lymph nodes, likely reactive. 5. Dilated main pulmonary artery as can be seen with pulmonary arterial hypertension. 6. Aortic atherosclerosis.  Cardiac Studies:   Lexiscan Sestamibi Stress Test 01/29/2014: There is a very small and very mild fixed defect in the mid anterior wall consistent with breast attenuation.. No reversible  perfusion defects are identified.  Lexiscan Sestamibi stress test 03/10/2020: No previous exam available for comparison. Lexiscan nuclear stress test performed using 1-day protocol. Stress EKG is non-diagnostic, as this is pharmacological stress test. Normal myocardial perfusion. Stress LVEF 59%. Low risk study.   Echocardiogram 03/01/2021: 1. Left ventricular ejection fraction, by estimation, is 55 to 60%. The  left ventricle has normal function. Left ventricular endocardial border  not optimally defined to evaluate regional wall motion. There is mild left  ventricular hypertrophy. Left  ventricular diastolic parameters are consistent with Grade II diastolic  dysfunction (pseudonormalization). Elevated left ventricular end-diastolic  pressure.   2. Right ventricular systolic function is normal. The right ventricular  size is mildly enlarged. Mildly increased right ventricular wall  thickness. There is moderately elevated pulmonary artery systolic  pressure.   3. Left atrial size was moderately dilated.   4. Right atrial size was mildly dilated.   5. The mitral valve is normal in structure. Mild to moderate mitral valve  regurgitation.   6. Tricuspid valve regurgitation is mild to moderate.   7. The aortic valve is normal in structure. Aortic valve regurgitation is  not visualized. No aortic stenosis is  present.   8. The inferior vena cava is dilated in size with <50% respiratory  variability, suggesting right atrial pressure of 15 mmHg.   EKG   EKG 12/18/2020: sinus rhythm with first-degree AV block at a rate of 65 bpm.  Normal axis.  Poor R wave progression, cannot exclude anteroseptal infarct old.  Incomplete right bundle branch block.  Low voltage complexes, underlying pulmonary disease pattern.  Compared to EKG 07/23/2020, no significant change.  Assessment     ICD-10-CM   1. Acute on chronic diastolic heart failure (HCC)  I50.33     2. Essential hypertension, benign  I10     3. Stage 3b chronic kidney disease (HCC)  N18.32     4. Bilateral leg edema  R60.0        No orders of the defined types were placed in this encounter.   There are no discontinued medications.   Recommendations:   Rebekha Diveley Lockamy  is a 65 y.o. female with morbid obesity  BMI 44, hypertension, restrictive airway disease due to bronchial asthma, hyperlipidemia, HFpEF, OSA and using CPAP due to feeling claustrophobic.  Patient was admitted to ICU at the Hospital on 02/26/2020 for acute respiratory failure due to acute pulmonary edema in the setting of uncontrolled hypertension and diastolic CHF exacerbation she has relatively recuperated well.   Patient was again admitted to the hospital 02/27/2021 - 03/04/2021 with acute on chronic diastolic heart failure.  Prior to presentation to the hospital patient had only been taking torsemide once daily, instead of twice daily as directed.  Patient diuresed well and shortness of breath resolved.  Patient was started on Farxiga and valsartan, and subsequently discharged on torsemide 20 mg once daily.  Patient is presently feeling much improved since discharge from the hospital.  She is tolerating guideline directed medical therapy without issue.  We will continue Farxiga, nebivolol, rosuvastatin, valsartan, aspirin, and atorvastatin.  There presently no clinical evidence of  acute decompensated heart failure.  Patient remains at high risk for heart failure exacerbation and hospitalization.  She continues to struggle with diet compliance and weight loss. Discussed at length with patient regarding salt restriction diet, weight loss, and medication compliance.  Patient verbalized understanding of diet and lifestyle modifications necessary to avoid future heart failure hospitalizations.  Also again discussed with patient the importance of treating sleep apnea as well as monitoring blood pressure on a regular basis at home.  Will not make changes to medications at this time.  Patient needs repeat lipid profile testing if not done by PCP in next visit in our office.  Counseled patient regarding as needed dosing of torsemide, she verbalized understanding..  Follow-up in 3 months, sooner if needed, for hypertension, chronic diastolic heart failure, and hyperlipidemia.   Alethia Berthold, PA-C 04/02/2021, 9:21 AM Office: [phone]

## 2021-04-02 ENCOUNTER — Ambulatory Visit: Payer: BC Managed Care – PPO | Admitting: Student

## 2021-04-02 ENCOUNTER — Other Ambulatory Visit: Payer: Self-pay

## 2021-04-02 ENCOUNTER — Encounter: Payer: Self-pay | Admitting: Student

## 2021-04-02 VITALS — BP 134/54 | HR 73 | Temp 98.2°F | Ht 64.0 in | Wt 235.0 lb

## 2021-04-02 DIAGNOSIS — N1832 Chronic kidney disease, stage 3b: Secondary | ICD-10-CM

## 2021-04-02 DIAGNOSIS — I10 Essential (primary) hypertension: Secondary | ICD-10-CM

## 2021-04-02 DIAGNOSIS — I5033 Acute on chronic diastolic (congestive) heart failure: Secondary | ICD-10-CM

## 2021-04-02 DIAGNOSIS — R600 Localized edema: Secondary | ICD-10-CM

## 2021-04-19 NOTE — Progress Notes (Signed)
Gynecologic Oncology Return Clinic Visit  04/21/21  Reason for Visit: surveillance visit in the setting of uterine cancer  Treatment History: Oncology History Overview Note  CA-125  10/12/19: 48.9  MSI stable   Endometrial cancer ([place])  09/19/2019 Initial Diagnosis   Endometrial cancer ([place])   09/19/2019 Initial Biopsy   EMB: EMCA with clear cell features   10/17/2019 Imaging   CT A/P: Mild endometrial thickening in this patient with known endometrial cancer. No evidence of metastatic disease in the abdomen/pelvis.  CXR 12/28: No evidence of acute cardiopulmonary abnormality.   10/24/2019 Cancer Staging   Staging form: Corpus Uteri - Carcinoma and Carcinosarcoma, AJCC 8th Edition - Clinical stage from 10/24/2019: FIGO Stage IA (cT1a, cN0(sn), cM0) - Signed by Lafonda Mosses, MD on 05/01/2020    10/25/2019 Surgery   TRH/BSO, bil SLN, repair of sulcal tear Findings: On EUA, small cervix, moderately mobile uterus, no adnexal masses.  On intra-abdominal entry, adhesions from the omentum to the anterior wall at the patient's prior hernia repair as well as to the lateral anterior wall bilaterally.  Right aspect of the liver and diaphragm of secured by adhesions.  Left liver edge, diaphragm and stomach normal in appearance.  Uterus 10 cm in bulbous.  Posterior cul-de-sac completely obliterated and bilateral fallopian tubes and adnexa adherent to posterior uterus and sigmoid mesentery.  Fallopian tubes and left ovary somewhat concerning for involvement by tumor versus prior history of pelvic infection or endometriosis.  Mapping successful to bilateral external iliac sentinel lymph nodes.  Small and large bowel normal appearing.  No intra-abdominal evidence of disease.  Significant right sulcal tear noted after delivery of specimen through the vagina, repaired robotically as well as vaginally.   10/25/2019 Cancer Staging   Endometrial cancer, Stage IA, grade 1 endometrioid MI <50%, no  LVSI, negative SLNs   05/01/2020 Relapse/Recurrence   A. VAGINAL CUFF, BIOPSY:  - Adenocarcinoma, see comment.   COMMENT:   The adenocarcinoma is similar in appearance to the patient's prior  endometrioid adenocarcinoma ([id number], reviewed).    05/05/2020 Imaging   CT C/A/P: No acute intra-abdominal or pelvic pathology.   05/28/2020 - 07/24/2020 Radiation Therapy    Radiation Treatment Dates: 05/28/2020 through 07/24/2020 Site Technique Total Dose (Gy) Dose per Fx (Gy) Completed Fx Beam Energies  Pelvis: Pelvis IMRT 45/45 1.8 25/25 6X  Vagina: Pelvis_Bst HDR-brachy 24/24 6 4/4 Ir-192        Interval History: The patient presents today for surveillance visit.  She saw Dr. Sondra Come on 3/28. Was doing well and NED at that time.  She denies any abdominal or pelvic pain.  She denies vaginal bleeding or discharge.  She has been using her vaginal dilator twice a week.  She endorses a good appetite without nausea or emesis.  She has intermittent constipation for which she uses Senokot with good relief.  She has some increased urinary frequency secondary to change in medications from a heart failure standpoint.  She was admitted in early May with acute respiratory failure secondary exacerbation of her heart failure.  She continues to get short of breath and feels about the same since that admission.  Starting in the mid afternoon, she notes sounding hoarse or congested.  She has intermittent wheezing.  She is using compression socks to help with her lower extremity edema with some relief.  She also feels that the change in her cardiac medications has helped some with her leg swelling.  Past Medical/Surgical History: Past Medical History:  Diagnosis  Date   Allergic rhinitis    Anemia    hematology, Dr. Julien Nordmann, last 01/2011   Asthma    Cervix cancer ([place])    CHF (congestive heart failure) ([place]) 2004   Chronic kidney disease    protein in urine   Complication of anesthesia    Dyslipidemia     Dyspnea    on excertion   Endometrial cancer ([place])    H/O echocardiogram 01/07/04   mild LVH, nl LV systolic function, EF 98%, left atrial enlargement, mildly thickened aortic and mitral valves   History of radiation therapy 8/4-21-07/24/20   Pelvis IMRT and HDR, Dr. Gery Pray   History of thyroid cancer    Hypertension    Incarcerated ventral hernia 10/07/2013   Obesity, Class III, BMI 40-49.9 (morbid obesity) ([place]) 10/09/2013   Osteopenia    PONV (postoperative nausea and vomiting)    Sleep apnea    Umbilical hernia    Unspecified essential hypertension 10/09/2013   Unspecified hypothyroidism 10/09/2013    Past Surgical History:  Procedure Laterality Date   BONE MARROW BIOPSY     CARDIAC CATHETERIZATION  2005   CHOLECYSTECTOMY  2003   COLONOSCOPY  2008   ROBOTIC ASSISTED TOTAL HYSTERECTOMY WITH BILATERAL SALPINGO OOPHERECTOMY N/A 10/25/2019   Procedure: XI ROBOTIC ASSISTED TOTAL HYSTERECTOMY WITH BILATERAL SALPINGO OOPHORECTOMY;  Surgeon: Lafonda Mosses, MD;  Location: [facility];  Service: Gynecology;  Laterality: N/A;   SENTINEL NODE BIOPSY N/A 10/25/2019   Procedure: SENTINEL LYMPH NODE BIOPSY;  Surgeon: Lafonda Mosses, MD;  Location: [facility];  Service: Gynecology;  Laterality: N/A;   THYROID SURGERY     partial thyroidectomy   TOTAL THYROIDECTOMY  2009   VENTRAL HERNIA REPAIR N/A 10/07/2013   Procedure: HERNIA REPAIR VENTRAL ADULT;  Surgeon: Gwenyth Ober, MD;  Location: [facility];  Service: General;  Laterality: N/A;    Family History  Problem Relation Age of Onset   Hypertension Mother    Pancreatic cancer Mother    Breast cancer Maternal Aunt    Lung cancer Father    Ovarian cancer Neg Hx    Endometrial cancer Neg Hx    Colon cancer Neg Hx     Social History   Socioeconomic History   Marital status: Divorced    Spouse name: Not on file   Number of children: 1   Years of education: Not on file   Highest education level: Not on file  Occupational  History   Not on file  Tobacco Use   Smoking status: Never   Smokeless tobacco: Never  Vaping Use   Vaping Use: Never used  Substance and Sexual Activity   Alcohol use: No   Drug use: No   Sexual activity: Not on file  Other Topics Concern   Not on file  Social History Narrative   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[employer], is on her feet for 8-hour shifts   Social Determinants of Health   Financial Resource Strain: Medium Risk   Difficulty of Paying Living Expenses: Somewhat hard  Food Insecurity: Landscape architect Present   Worried About Charity fundraiser in the Last Year: Never true   [place] in the Last Year: Sometimes true  Transportation Needs: No Transportation Needs   Lack of Transportation (Medical): No   Lack of Transportation (Non-Medical): No  Physical Activity: Not on file  Stress: Not on file  Social Connections: Not on file    Current Medications:  Current Outpatient Medications:    albuterol (PROVENTIL HFA;VENTOLIN HFA) 108 (90 Base) MCG/ACT inhaler, Inhale 2 puffs into the lungs every 4 (four) hours as needed for wheezing or shortness of breath., Disp: 1 Inhaler, Rfl: 0   aspirin EC 81 MG tablet, Take 81 mg by mouth daily., Disp: , Rfl:    Calcium Carbonate-Vitamin D (CALCIUM 600+D PO), Take 1 tablet by mouth 2 (two) times daily., Disp: , Rfl:    dapagliflozin propanediol (FARXIGA) 10 MG TABS tablet, Take 1 tablet (10 mg total) by mouth daily., Disp: 30 tablet, Rfl: 2   FeFum-FePoly-FA-B Cmp-C-Biot (INTEGRA PLUS) CAPS, Take 1 capsule by mouth daily., Disp: , Rfl:    fluticasone-salmeterol (ADVAIR) 250-50 MCG/ACT AEPB, Inhale 1 puff into the lungs in the morning and at bedtime., Disp: , Rfl:    levothyroxine (SYNTHROID, LEVOTHROID) 150 MCG tablet, Take 150 mcg by mouth daily before breakfast. , Disp: , Rfl:    loratadine (CLARITIN) 10 MG tablet, Take 10 mg by mouth daily., Disp: , Rfl:    Nebivolol HCl 20 MG TABS, Take 1 tablet by mouth once daily,  Disp: 90 tablet, Rfl: 0   olopatadine (PATANOL) 0.1 % ophthalmic solution, Place 1 drop into both eyes 2 (two) times daily. , Disp: , Rfl:    rosuvastatin (CRESTOR) 10 MG tablet, Take 10 mg by mouth at bedtime., Disp: , Rfl:    senna-docusate (SENOKOT-S) 8.6-50 MG tablet, Take 1 tablet by mouth 2 (two) times daily as needed for moderate constipation., Disp:  , Rfl:    torsemide (DEMADEX) 20 MG tablet, Take 1 tablet (20 mg total) by mouth daily., Disp: 30 tablet, Rfl: 1   valsartan (DIOVAN) 40 MG tablet, Take 1 tablet (40 mg total) by mouth daily., Disp: 30 tablet, Rfl: 11  Review of Systems: Pertinent positives include shortness of breath, wheezing, and leg swelling. Denies appetite changes, fevers, chills, fatigue, unexplained weight changes. Denies hearing loss, neck lumps or masses, mouth sores, ringing in ears or voice changes. Denies cough. Denies chest pain or palpitations. Denies abdominal distention, pain, blood in stools, constipation, diarrhea, nausea, vomiting, or early satiety. Denies pain with intercourse, dysuria, frequency, hematuria or incontinence. Denies hot flashes, pelvic pain, vaginal bleeding or vaginal discharge.   Denies joint pain, back pain or muscle pain/cramps. Denies itching, rash, or wounds. Denies dizziness, headaches, numbness or seizures. Denies swollen lymph nodes or glands, denies easy bruising or bleeding. Denies anxiety, depression, confusion, or decreased concentration.  Physical Exam: BP 139/60 (BP Location: Left Arm, Patient Position: Sitting)   Pulse 64   Temp (!) 97.4 F (36.3 C) (Tympanic)   Resp 18   Ht _0  (1.626 m)   Wt 238 lb 9.6 oz (108.2 kg)   LMP 10/22/2011   SpO2 100%   BMI 40.96 kg/m  General: Alert, oriented, no acute distress. HEENT: Normocephalic, atraumatic, sclera anicteric. Chest: Mild expiratory wheezing on exam, otherwise lungs clear to auscultation bilaterally.  No rhonchi. Cardiovascular: Regular rate and rhythm, no  murmurs. Abdomen: Obese, soft, nontender.  Normoactive bowel sounds.  No masses or hepatosplenomegaly appreciated.  Well-healed laparoscopic incisions. Extremities: Grossly normal range of motion.  Warm, well perfused.  1-2+ edema bilaterally. Skin: No rashes or lesions noted. Lymphatics: No cervical, supraclavicular, or inguinal adenopathy. GU: Normal appearing external genitalia without erythema, excoriation, or lesions.  Speculum exam reveals moderately atrophic vaginal mucosa with radiation changes apparent.  No bleeding or discharge.  No masses appreciated.  Bimanual exam reveals cuff intact, no masses  or nodularity.  Rectovaginal exam confirms these findings.  Laboratory & Radiologic Studies: None new  Assessment & Plan: Bridget Lamb is a 65 y.o. woman with early stage uterine cancer who presented in July 2021 with vaginal bleeding found to have a recurrence at the vaginal cuff treated with RT.  Patient presents today for surveillance visit.  She is now approximately 9 months out from finishing pelvic radiation with brachy boost to the vagina.  She is asymptomatic from a cancer standpoint with no evidence of disease on exam.  I encouraged her to continue using vaginal dilator as she has been.  We will continue with visits every 3 months until she is to years out from her recurrence.  She is seeing Dr. Sondra Come in the fall and will come back to see me in December.  She is following closely with cardiology with regard to her recent exacerbation of heart failure.  She continues to have some respiratory symptoms as well as edema.    32 minutes of total time was spent for this patient encounter, including preparation, face-to-face counseling with the patient and coordination of care, and documentation of the encounter.  Jeral Pinch, MD  Division of Gynecologic Oncology  Department of Obstetrics and Gynecology  [facility]

## 2021-04-20 ENCOUNTER — Encounter: Payer: Self-pay | Admitting: Gynecologic Oncology

## 2021-04-21 ENCOUNTER — Inpatient Hospital Stay: Payer: BC Managed Care – PPO | Attending: Gynecologic Oncology | Admitting: Gynecologic Oncology

## 2021-04-21 ENCOUNTER — Encounter: Payer: Self-pay | Admitting: Gynecologic Oncology

## 2021-04-21 ENCOUNTER — Other Ambulatory Visit: Payer: Self-pay

## 2021-04-21 VITALS — BP 139/60 | HR 64 | Temp 97.4°F | Resp 18 | Ht 64.0 in | Wt 238.6 lb

## 2021-04-21 DIAGNOSIS — I130 Hypertensive heart and chronic kidney disease with heart failure and stage 1 through stage 4 chronic kidney disease, or unspecified chronic kidney disease: Secondary | ICD-10-CM | POA: Insufficient documentation

## 2021-04-21 DIAGNOSIS — Z90722 Acquired absence of ovaries, bilateral: Secondary | ICD-10-CM | POA: Diagnosis not present

## 2021-04-21 DIAGNOSIS — C541 Malignant neoplasm of endometrium: Secondary | ICD-10-CM

## 2021-04-21 DIAGNOSIS — E039 Hypothyroidism, unspecified: Secondary | ICD-10-CM | POA: Insufficient documentation

## 2021-04-21 DIAGNOSIS — G4730 Sleep apnea, unspecified: Secondary | ICD-10-CM | POA: Insufficient documentation

## 2021-04-21 DIAGNOSIS — Z7951 Long term (current) use of inhaled steroids: Secondary | ICD-10-CM | POA: Diagnosis not present

## 2021-04-21 DIAGNOSIS — E785 Hyperlipidemia, unspecified: Secondary | ICD-10-CM | POA: Insufficient documentation

## 2021-04-21 DIAGNOSIS — Z7982 Long term (current) use of aspirin: Secondary | ICD-10-CM | POA: Diagnosis not present

## 2021-04-21 DIAGNOSIS — M8580 Other specified disorders of bone density and structure, unspecified site: Secondary | ICD-10-CM | POA: Insufficient documentation

## 2021-04-21 DIAGNOSIS — I509 Heart failure, unspecified: Secondary | ICD-10-CM | POA: Insufficient documentation

## 2021-04-21 DIAGNOSIS — K5900 Constipation, unspecified: Secondary | ICD-10-CM | POA: Insufficient documentation

## 2021-04-21 DIAGNOSIS — Z8542 Personal history of malignant neoplasm of other parts of uterus: Secondary | ICD-10-CM

## 2021-04-21 DIAGNOSIS — Z923 Personal history of irradiation: Secondary | ICD-10-CM | POA: Insufficient documentation

## 2021-04-21 DIAGNOSIS — Z7984 Long term (current) use of oral hypoglycemic drugs: Secondary | ICD-10-CM | POA: Insufficient documentation

## 2021-04-21 DIAGNOSIS — Z79899 Other long term (current) drug therapy: Secondary | ICD-10-CM | POA: Diagnosis not present

## 2021-04-21 DIAGNOSIS — N189 Chronic kidney disease, unspecified: Secondary | ICD-10-CM | POA: Insufficient documentation

## 2021-04-21 DIAGNOSIS — J45909 Unspecified asthma, uncomplicated: Secondary | ICD-10-CM | POA: Insufficient documentation

## 2021-04-21 DIAGNOSIS — Z6841 Body Mass Index (BMI) 40.0 and over, adult: Secondary | ICD-10-CM | POA: Insufficient documentation

## 2021-04-21 DIAGNOSIS — Z90710 Acquired absence of both cervix and uterus: Secondary | ICD-10-CM | POA: Insufficient documentation

## 2021-04-21 NOTE — Patient Instructions (Signed)
It was great to see you today!  I do not see or feel anything on your exam that is concerning for recurrence.  I will see you back in December for follow-up.  As always, please call if any new symptoms come up.

## 2021-04-22 ENCOUNTER — Other Ambulatory Visit: Payer: Self-pay | Admitting: Student

## 2021-04-22 DIAGNOSIS — I5032 Chronic diastolic (congestive) heart failure: Secondary | ICD-10-CM

## 2021-04-22 DIAGNOSIS — I10 Essential (primary) hypertension: Secondary | ICD-10-CM

## 2021-04-29 ENCOUNTER — Telehealth: Payer: Self-pay

## 2021-04-29 ENCOUNTER — Other Ambulatory Visit: Payer: Self-pay

## 2021-04-29 MED ORDER — TORSEMIDE 20 MG PO TABS: 20.0000 mg | ORAL_TABLET | Freq: Every day | ORAL | 1 refills | Status: DC

## 2021-05-07 ENCOUNTER — Other Ambulatory Visit: Payer: Self-pay

## 2021-05-07 ENCOUNTER — Ambulatory Visit
Admission: RE | Admit: 2021-05-07 | Discharge: 2021-05-07 | Disposition: A | Discharge status: Home / Self Care | Payer: BC Managed Care – PPO | Source: Ambulatory Visit | Attending: Physician Assistant | Admitting: Physician Assistant

## 2021-05-07 DIAGNOSIS — Z1231 Encounter for screening mammogram for malignant neoplasm of breast: Secondary | ICD-10-CM

## 2021-05-08 NOTE — Telephone Encounter (Signed)
error 

## 2021-06-22 ENCOUNTER — Other Ambulatory Visit (HOSPITAL_COMMUNITY): Payer: Self-pay

## 2021-07-02 ENCOUNTER — Other Ambulatory Visit: Payer: Self-pay

## 2021-07-02 ENCOUNTER — Inpatient Hospital Stay: Payer: BC Managed Care – PPO

## 2021-07-02 ENCOUNTER — Encounter: Payer: Self-pay | Admitting: Student

## 2021-07-02 ENCOUNTER — Ambulatory Visit: Payer: BC Managed Care – PPO | Admitting: Student

## 2021-07-02 VITALS — BP 134/68 | HR 56 | Temp 97.3°F | Ht 64.0 in | Wt 244.0 lb

## 2021-07-02 DIAGNOSIS — R002 Palpitations: Secondary | ICD-10-CM | POA: Diagnosis not present

## 2021-07-02 DIAGNOSIS — I5033 Acute on chronic diastolic (congestive) heart failure: Secondary | ICD-10-CM | POA: Diagnosis not present

## 2021-07-02 DIAGNOSIS — R0989 Other specified symptoms and signs involving the circulatory and respiratory systems: Secondary | ICD-10-CM

## 2021-07-02 NOTE — Progress Notes (Signed)
Primary Physician/Referring:  [name], PA-C  Patient ID: Bridget Lamb, female    DOB: January 17, 1956, 65 y.o.   MRN: [id number]  Chief Complaint  Patient presents with   Hypertension   Follow-up   HPI:    Bridget Lamb  is a 65 y.o. female with morbid obesity BMI 44, hypertension, restrictive airway disease due to bronchial asthma, hyperlipidemia, HFpEF, OSA now compliant with CPAP.  Patient has had multiple hospitalizations with acute on chronic diastolic heart failure in 02/2020 and again 02/2021.    Patient presents for 88-monthfollow-up of hypertension, chronic diastolic heart failure, and hyperlipidemia.  At last visit no changes were made.  Patient reports since last office visit she has been having episodes of palpitations occurring approximately 2 times weekly and lasting for several minutes.  These episodes primarily occur at rest.  Denies associated symptoms. Denies chest pain, dizziness, syncope, near syncope, orthopnea, PND.  She continues to have bilateral leg edema which is chronic, and has actually improved since last office visit.  Patient continues to struggle with diet and lifestyle modifications.  In fact she admits to particularly high sodium intake over the last 24 hours.  She also did not take her medications before today's office visit.  Past Medical History:  Diagnosis Date   Allergic rhinitis    Anemia    hematology, Dr. MJulien Nordmann last 01/2011   Asthma    Cervix cancer ([facility]    CHF (congestive heart failure) ([facility] [place]   Chronic kidney disease    protein in urine   Complication of anesthesia    Dyslipidemia    Dyspnea    on excertion   Endometrial cancer ([facility]    H/O echocardiogram 01/07/04   mild LVH, nl LV systolic function, EF 627% left atrial enlargement, mildly thickened aortic and mitral valves   History of radiation therapy 8/4-21-07/24/20   Pelvis IMRT and HDR, Dr. JGery Pray  History of thyroid cancer    Hypertension     Incarcerated ventral hernia 10/07/2013   Obesity, Class III, BMI 40-49.9 (morbid obesity) ([facility] 10/09/2013   Osteopenia    PONV (postoperative nausea and vomiting)    Sleep apnea    Umbilical hernia    Unspecified essential hypertension 10/09/2013   Unspecified hypothyroidism 10/09/2013   Past Surgical History:  Procedure Laterality Date   BONE MARROW BIOPSY     CARDIAC CATHETERIZATION  2005   CHOLECYSTECTOMY  2003   COLONOSCOPY  2008   ROBOTIC ASSISTED TOTAL HYSTERECTOMY WITH BILATERAL SALPINGO OOPHERECTOMY N/A 10/25/2019   Procedure: XI ROBOTIC ASSISTED TOTAL HYSTERECTOMY WITH BILATERAL SALPINGO OOPHORECTOMY;  Surgeon: TLafonda Mosses MD;  Location: [facility];  Service: Gynecology;  Laterality: N/A;   SENTINEL NODE BIOPSY N/A 10/25/2019   Procedure: SENTINEL LYMPH NODE BIOPSY;  Surgeon: TLafonda Mosses MD;  Location: [facility];  Service: Gynecology;  Laterality: N/A;   THYROID SURGERY     partial thyroidectomy   TOTAL THYROIDECTOMY  2009   VENTRAL HERNIA REPAIR N/A 10/07/2013   Procedure: HERNIA REPAIR VENTRAL ADULT;  Surgeon: JGwenyth Ober MD;  [facility]  Service: General;  Laterality: N/A;   Family History  Problem Relation Age of Onset   Hypertension Mother    Pancreatic cancer Mother    Breast cancer Maternal Aunt    Lung cancer Father    Ovarian cancer Neg Hx    Endometrial cancer Neg Hx    Colon cancer Neg Hx  Social History   Tobacco Use   Smoking status: Never   Smokeless tobacco: Never  Substance Use Topics   Alcohol use: No   Marital Status: Divorced  ROS  Review of Systems  Constitutional: Negative for malaise/fatigue and weight gain.  Cardiovascular:  Positive for dyspnea on exertion (chronic, stable.) and leg swelling (minimal, wears compression stockings). Negative for chest pain, claudication, near-syncope, palpitations, paroxysmal nocturnal dyspnea and syncope.  Respiratory:  Positive for snoring (now compliant with CPAP). Negative for  shortness of breath (improved).   Hematologic/Lymphatic: Does not bruise/bleed easily.  Musculoskeletal:  Positive for joint pain (knee and hips chronic).  Gastrointestinal:  Negative for melena.  Neurological:  Negative for dizziness and weakness.  Objective  Blood pressure 134/68, pulse (!) 56, temperature (!) 97.3 F (36.3 C), height _0  (1.626 m), weight 244 lb (110.7 kg), last menstrual period 10/22/2011, SpO2 100 %.  Vitals with BMI 07/02/2021 07/02/2021 04/21/2021  Height - _1  _2   Weight - 244 lbs 238 lbs 10 oz  BMI - 34.91 79.15  Systolic 056 979 480  Diastolic 68 64 60  Pulse - 56 64     Physical Exam Vitals reviewed.  Constitutional:      General: She is not in acute distress.    Appearance: She is well-developed.     Comments: Morbidly obese  Cardiovascular:     Rate and Rhythm: Normal rate and regular rhythm.     Pulses: Intact distal pulses.          Carotid pulses are  on the right side with bruit and  on the left side with bruit.    Heart sounds: S1 normal and S2 normal. Murmur heard.  Early systolic murmur is present with a grade of 1/6 at the apex.  Harsh mid to late systolic murmur is also present at the upper right sternal border.    No gallop.     Comments: No JVD  Pulmonary:     Effort: Pulmonary effort is normal. No accessory muscle usage or respiratory distress.     Breath sounds: Normal breath sounds. No wheezing, rhonchi or rales.  Abdominal:     Comments: Pannus present  Musculoskeletal:        General: No tenderness.     Right lower leg: Edema (trace) present.     Left lower leg: Edema (trace) present.  Skin:    General: Skin is warm and dry.  Neurological:     Mental Status: She is alert.  Physical exam unchanged compared to previous. Laboratory examination:   Recent Labs    03/02/21 0829 03/03/21 0447 03/04/21 0334  NA 141 142 140  K 3.6 3.9 3.7  CL 103 101 96*  CO2 32 34* 36*  GLUCOSE 129* 94 94  BUN 36* 30* 23  CREATININE  1.33* 1.28* 1.26*  CALCIUM 8.7* 9.0 9.2  GFRNONAA 44* 46* 47*   CrCl cannot be calculated (Patient's most recent lab result is older than the maximum 21 days allowed.).  CMP Latest Ref Rng & Units 03/04/2021 03/03/2021 03/02/2021  Glucose 70 - 99 mg/dL 94 94 129(H)  BUN 8 - 23 mg/dL 23 30(H) 36(H)  Creatinine 0.44 - 1.00 mg/dL 1.26(H) 1.28(H) 1.33(H)  Sodium 135 - 145 mmol/L 140 142 141  Potassium 3.5 - 5.1 mmol/L 3.7 3.9 3.6  Chloride 98 - 111 mmol/L 96(L) 101 103  CO2 22 - 32 mmol/L 36(H) 34(H) 32  Calcium 8.9 - 10.3 mg/dL 9.2 9.0 8.7(L)  Total Protein 6.5 - 8.1 g/dL - - 5.9(L)  Total Bilirubin 0.3 - 1.2 mg/dL - - 0.4  Alkaline Phos 38 - 126 U/L - - 62  AST 15 - 41 U/L - - 18  ALT 0 - 44 U/L - - 17   CBC Latest Ref Rng & Units 03/04/2021 03/03/2021 03/02/2021  WBC 4.0 - 10.5 K/uL 6.3 5.5 5.4  Hemoglobin 12.0 - 15.0 g/dL 9.3(L) 9.1(L) 9.3(L)  Hematocrit 36.0 - 46.0 % 29.2(L) 28.9(L) 29.5(L)  Platelets 150 - 400 K/uL 162 139(L) 164   TSH No results for input(s): TSH in the last 8760 hours. BNP (last 3 results) Recent Labs    02/28/21 0239 03/03/21 0447 03/04/21 0334  BNP 873.8* 269.7* 272.5*   Magnesium:  Recent Labs    03/02/21 0829 03/03/21 0447 03/04/21 0334  MG 2.2 2.3 2.3    External labs:  03/16/2021: NT proBNP 1054 Glucose 96, BUN 29, creatinine 1.53, GFR 30, sodium 142, potassium 4.9, AST 16, ALT 13, alk phos 99  02/28/2021: HDL 49, LDL 57, total cholesterol 117, triglycerides 54 A1c 5.4% BUN 23, creatinine 1.26,   12/15/2020: NT proBNP 820 Glucose 96, BUN 37, creatinine 1.52, GFR 36, sodium 146, potassium 4.4  12/09/2020: proBNP 1805 Total cholesterol 120, triglycerides 82, HDL 48, LDL 56, non-HDL 72 Glucose 100, BUN 25, creatinine 1.5, GFR 36, sodium 144, potassium 4.6  08/28/2020: Creatinine 1.27, EGFR 44, BUN 19, sodium 146, potassium 4.3    Allergies   Allergies  Allergen Reactions   Procardia [Nifedipine] Other (See Comments)    Ineffective      Medications Prior to Visit:   Outpatient Medications Prior to Visit  Medication Sig Dispense Refill   albuterol (PROVENTIL HFA;VENTOLIN HFA) 108 (90 Base) MCG/ACT inhaler Inhale 2 puffs into the lungs every 4 (four) hours as needed for wheezing or shortness of breath. 1 Inhaler 0   aspirin EC 81 MG tablet Take 81 mg by mouth daily.     Calcium Carbonate-Vitamin D (CALCIUM 600+D PO) Take 1 tablet by mouth 2 (two) times daily.     dapagliflozin propanediol (FARXIGA) 10 MG TABS tablet Take 1 tablet (10 mg total) by mouth daily. 30 tablet 2   FeFum-FePoly-FA-B Cmp-C-Biot (INTEGRA PLUS) CAPS Take 1 capsule by mouth daily.     fluticasone-salmeterol (ADVAIR) 250-50 MCG/ACT AEPB Inhale 1 puff into the lungs in the morning and at bedtime.     levothyroxine (SYNTHROID) 125 MCG tablet Take 150 mcg by mouth.     loratadine (CLARITIN) 10 MG tablet Take 10 mg by mouth daily.     Nebivolol HCl 20 MG TABS Take 1 tablet by mouth once daily 90 tablet 0   olopatadine (PATANOL) 0.1 % ophthalmic solution Place 1 drop into both eyes 2 (two) times daily.      rosuvastatin (CRESTOR) 10 MG tablet Take 10 mg by mouth at bedtime.     senna-docusate (SENOKOT-S) 8.6-50 MG tablet Take 1 tablet by mouth 2 (two) times daily as needed for moderate constipation.     torsemide (DEMADEX) 20 MG tablet Take 1 tablet (20 mg total) by mouth daily. 90 tablet 1   valsartan (DIOVAN) 40 MG tablet Take 1 tablet (40 mg total) by mouth daily. 30 tablet 11   albuterol (VENTOLIN HFA) 108 (90 Base) MCG/ACT inhaler Inhale into the lungs.     gabapentin (NEURONTIN) 300 MG capsule Take by mouth. (Patient not taking: Reported on 07/02/2021)     levothyroxine (SYNTHROID, LEVOTHROID) 150 MCG  tablet Take 150 mcg by mouth daily before breakfast.      meloxicam (MOBIC) 15 MG tablet Take 15 mg by mouth daily.     No facility-administered medications prior to visit.   Final Medications at End of Visit    Current Meds  Medication Sig   albuterol  (PROVENTIL HFA;VENTOLIN HFA) 108 (90 Base) MCG/ACT inhaler Inhale 2 puffs into the lungs every 4 (four) hours as needed for wheezing or shortness of breath.   aspirin EC 81 MG tablet Take 81 mg by mouth daily.   Calcium Carbonate-Vitamin D (CALCIUM 600+D PO) Take 1 tablet by mouth 2 (two) times daily.   dapagliflozin propanediol (FARXIGA) 10 MG TABS tablet Take 1 tablet (10 mg total) by mouth daily.   FeFum-FePoly-FA-B Cmp-C-Biot (INTEGRA PLUS) CAPS Take 1 capsule by mouth daily.   fluticasone-salmeterol (ADVAIR) 250-50 MCG/ACT AEPB Inhale 1 puff into the lungs in the morning and at bedtime.   levothyroxine (SYNTHROID) 125 MCG tablet Take 150 mcg by mouth.   loratadine (CLARITIN) 10 MG tablet Take 10 mg by mouth daily.   Nebivolol HCl 20 MG TABS Take 1 tablet by mouth once daily   olopatadine (PATANOL) 0.1 % ophthalmic solution Place 1 drop into both eyes 2 (two) times daily.    rosuvastatin (CRESTOR) 10 MG tablet Take 10 mg by mouth at bedtime.   senna-docusate (SENOKOT-S) 8.6-50 MG tablet Take 1 tablet by mouth 2 (two) times daily as needed for moderate constipation.   torsemide (DEMADEX) 20 MG tablet Take 1 tablet (20 mg total) by mouth daily.   valsartan (DIOVAN) 40 MG tablet Take 1 tablet (40 mg total) by mouth daily.    Radiology:   Chest x-ray 02/27/2021: Stable enlarged cardiac silhouette. There is central venous congestion in mild peripheral interstitial markings increased from prior. Small effusions. No focal consolidation. Degenerative osteophytosis of the spine.   IMPRESSION: Cardiomegaly central venous congestion, interstitial prominence, and small effusions suggest volume overload/congestive heart failure.  CT angio chest PE 02/27/2021: 1. No pulmonary embolus or acute aortic syndrome. 2. Cardiomegaly, small pleural effusions and pulmonary edema, likely congestive heart failure. Aortic atherosclerosis (ICD10-I70.0).   CT angiography chest with contrast 02/20/2020: 1. No  evidence for acute pulmonary embolus. 2. Patchy ground-glass and consolidative opacities throughout the aerated lungs bilaterally which may represent edema or infection. Additionally, there are scattered areas of consolidation within the lungs bilaterally which may represent atelectasis or infection. Consider follow-up chest CT in 6-8 weeks after resolution of acute symptomatology to ensure the nodularity resolves. 3. Moderate bilateral pleural effusions. 4. Prominent mediastinal lymph nodes, likely reactive. 5. Dilated main pulmonary artery as can be seen with pulmonary arterial hypertension. 6. Aortic atherosclerosis.  Cardiac Studies:   Lexiscan Sestamibi Stress Test 01/29/2014: There is a very small and very mild fixed defect in the mid anterior wall consistent with breast attenuation.. No reversible  perfusion defects are identified.  Lexiscan Sestamibi stress test 03/10/2020: No previous exam available for comparison. Lexiscan nuclear stress test performed using 1-day protocol. Stress EKG is non-diagnostic, as this is pharmacological stress test. Normal myocardial perfusion. Stress LVEF 59%. Low risk study.   Echocardiogram 03/01/2021: 1. Left ventricular ejection fraction, by estimation, is 55 to 60%. The  left ventricle has normal function. Left ventricular endocardial border  not optimally defined to evaluate regional wall motion. There is mild left  ventricular hypertrophy. Left  ventricular diastolic parameters are consistent with Grade II diastolic  dysfunction (pseudonormalization). Elevated left ventricular end-diastolic  pressure.  2. Right ventricular systolic function is normal. The right ventricular  size is mildly enlarged. Mildly increased right ventricular wall  thickness. There is moderately elevated pulmonary artery systolic  pressure.   3. Left atrial size was moderately dilated.   4. Right atrial size was mildly dilated.   5. The mitral valve is normal in  structure. Mild to moderate mitral valve  regurgitation.   6. Tricuspid valve regurgitation is mild to moderate.   7. The aortic valve is normal in structure. Aortic valve regurgitation is  not visualized. No aortic stenosis is present.   8. The inferior vena cava is dilated in size with <50% respiratory  variability, suggesting right atrial pressure of 15 mmHg.   EKG   07/02/2021: Sinus bradycardia with first-degree AV block at a rate of 59 bpm.  Normal axis.  Poor R wave progression, cannot exclude anteroseptal infarct old.  Incomplete right bundle branch block.  Low voltage axis, consider pulmonary disease pattern.  Compared to EKG 12/18/2020, no significant change.  Assessment     ICD-10-CM   1. Acute on chronic diastolic heart failure (HCC)  I50.33 EKG 12-Lead    2. Palpitations  R00.2 LONG TERM MONITOR (3-14 DAYS)    3. Bilateral carotid bruits  R09.89 PCV CAROTID DUPLEX (BILATERAL)       No orders of the defined types were placed in this encounter.   Medications Discontinued During This Encounter  Medication Reason   levothyroxine (SYNTHROID, LEVOTHROID) 829 MCG tablet Duplicate   albuterol (VENTOLIN HFA) 108 (90 Base) MCG/ACT inhaler Duplicate   gabapentin (NEURONTIN) 300 MG capsule Error   meloxicam (MOBIC) 15 MG tablet Error     Recommendations:   Salle Brandle Nestle  is a 65 y.o. female with morbid obesity BMI 44, hypertension, restrictive airway disease due to bronchial asthma, hyperlipidemia, HFpEF, OSA now compliant with CPAP.  Patient has had multiple hospitalizations with acute on chronic diastolic heart failure in 02/2020 and again 02/2021.    Patient presents for 76-monthfollow-up of hypertension, chronic diastolic heart failure, and hyperlipidemia.  At last visit no changes were made.  Patient's blood pressure was initially elevated in the office, upon recheck it is well controlled.  On physical exam she does have bilateral carotid artery bruits, will therefore  obtain carotid artery duplex.  Given patient's new onset of palpitations as well as multiple risk factors for cardiac arrhythmias including obesity and sleep apnea will obtain 2-week cardiac monitor.  Personally reviewed external labs, lipids are well controlled.  Will not make changes to patient's medications at this time.  Patient remains at high risk for heart failure exacerbation and hospitalization.  She continues to struggle with diet compliance and weight loss. Discussed at length with patient regarding salt restriction diet, weight loss, and medication compliance.  Follow-up in 6 months, sooner if needed, for hypertension and HFpEF.   CAlethia Berthold PA-C 07/02/2021, 12:26 PM Office: [phone]

## 2021-07-16 ENCOUNTER — Other Ambulatory Visit: Payer: Self-pay

## 2021-07-16 ENCOUNTER — Ambulatory Visit: Payer: BC Managed Care – PPO

## 2021-07-16 DIAGNOSIS — R0989 Other specified symptoms and signs involving the circulatory and respiratory systems: Secondary | ICD-10-CM | POA: Diagnosis not present

## 2021-07-16 DIAGNOSIS — I6523 Occlusion and stenosis of bilateral carotid arteries: Secondary | ICD-10-CM

## 2021-07-20 NOTE — Progress Notes (Signed)
Called patient, NA, LMAM

## 2021-07-21 NOTE — Progress Notes (Signed)
Called and spoke with patient regarding her CAD results. Patient will continue aspirin and statin therapy as directed.

## 2021-07-22 NOTE — Progress Notes (Signed)
Radiation Oncology         [phone] ________________________________  Name: Bridget Lamb MRN: [id number]  Date: 07/23/2021  DOB: 07/01/1956  Follow-Up Visit Note  CC: [name], PA-C  [name], PA-C    ICD-10-CM   1. Endometrial cancer (HCC)  C54.1       Diagnosis: Recurrent FIGO stage IA (cT1a, cN0, cM0) endometrial adenocarcinoma, grade 1  Interval Since Last Radiation: 1 year    Radiation Treatment Dates: 05/28/2020 through 07/24/2020 Site Technique Total Dose (Gy) Dose per Fx (Gy) Completed Fx Beam Energies  Pelvis: Pelvis IMRT 45/45 1.8 25/25 6X  Vagina: Pelvis_Bst HDR-brachy 24/24 6 4/4 Ir-192    Narrative:  The patient returns today for routine follow-up, she was last seen here for follow-up on 01/19/2021. Since her last visit, the patient presented to the ED on 02/27/21 with worsening shortness of breath and was admitted with acute hypoxemic respiratory failure due to HFpEF with exacerbation. Chest CT PE protocol performed at presentation showed no pulmonary embolism though was notable for small effusion and pulmonary edema likely due to CHF.  Patient was accordingly started on Lasix and Farxiga per cardiology. She also was started on Entresto.  Echocardiogram also performed was notable for EF of 55 to 60%, mild LVH, grade 2 diastolic dysfunction, moderately elevated PASP. The patient was then discharged on 03/04/21 on torsemide 20 mg p.o. daily and started on valsartan 40 mg p.o. daily.   The patient soon after followed up with Dr. Berline Lopes on 04/21/21. During this time, the patient denied any vaginal bleeding or discharge  and reports using her vaginal dialtor about once per a week. She did however report intermittent constipation for which she uses Senokot with good relief. She also reported some  increased urinary frequency secondary to change in medications from a heart failure standpoint. The patient additionally reported continued shortness of breath  since her ED encounter on 02/27/21, and associated intermittent wheezing. The patient was otherwise noted as asymptomatic from a cancer standpoint with no evidence of disease on exam.  Pertinent imaging since the patient was last seen includes a bilateral screening mammogram performed on 05/07/21, which demonstrated no evidence of malignancy bilaterally.   OF note: the patient presented to Dr. Loyal Buba at [facility] on 05/25/21 with a 5 day history of ankle pain with swelling. She was treated with anti-inflammatories as well as a Medrol Dosepak which improved her symptoms significantly. Pain and swelling was attributed to increased activity and her job which has her on her feet all day.  Patient denies any abdominal bloating or pelvic pain.  She denies any vaginal bleeding or discharge.  She is using her vaginal dilator as recommended.  She denies any spotting or bleeding after dilator use.  Allergies:  is allergic to procardia [nifedipine].  Meds: Current Outpatient Medications  Medication Sig Dispense Refill   albuterol (PROVENTIL HFA;VENTOLIN HFA) 108 (90 Base) MCG/ACT inhaler Inhale 2 puffs into the lungs every 4 (four) hours as needed for wheezing or shortness of breath. 1 Inhaler 0   aspirin EC 81 MG tablet Take 81 mg by mouth daily.     Calcium Carbonate-Vitamin D (CALCIUM 600+D PO) Take 1 tablet by mouth 2 (two) times daily.     dapagliflozin propanediol (FARXIGA) 10 MG TABS tablet Take 1 tablet (10 mg total) by mouth daily. 30 tablet 2   FeFum-FePoly-FA-B Cmp-C-Biot (INTEGRA PLUS) CAPS Take 1 capsule by mouth daily.     fluticasone-salmeterol (ADVAIR)  250-50 MCG/ACT AEPB Inhale 1 puff into the lungs in the morning and at bedtime.     levothyroxine (SYNTHROID) 125 MCG tablet Take 150 mcg by mouth.     loratadine (CLARITIN) 10 MG tablet Take 10 mg by mouth daily.     Nebivolol HCl 20 MG TABS Take 1 tablet by mouth once daily 90 tablet 0   olopatadine (PATANOL) 0.1 % ophthalmic  solution Place 1 drop into both eyes 2 (two) times daily.      rosuvastatin (CRESTOR) 10 MG tablet Take 10 mg by mouth at bedtime.     senna-docusate (SENOKOT-S) 8.6-50 MG tablet Take 1 tablet by mouth 2 (two) times daily as needed for moderate constipation.     torsemide (DEMADEX) 20 MG tablet Take 1 tablet (20 mg total) by mouth daily. 90 tablet 1   valsartan (DIOVAN) 40 MG tablet Take 1 tablet (40 mg total) by mouth daily. 30 tablet 11   No current facility-administered medications for this encounter.    Physical Findings: The patient is in no acute distress. Patient is alert and oriented.  height is 5\' 4"  (1.626 m) and weight is 245 lb (111.1 kg). Her temperature is 98.3 F (36.8 C). Her blood pressure is 152/60 (abnormal) and her pulse is 56 (abnormal). Her respiration is 20 and oxygen saturation is 94%. .  No significant changes. Lungs are clear to auscultation bilaterally. Heart has regular rate and rhythm. No palpable cervical, supraclavicular, or axillary adenopathy. Abdomen soft, non-tender, normal bowel sounds.  On pelvic examination the external genitalia are unremarkable.  A speculum exam is performed.  No mucosal lesions noted in the vaginal vault.  No lesions noted at the vaginal cuff.  On bimanual and rectovaginal examination no pelvic masses appreciated.  Vaginal cuff intact.  Rectal sphincter tone normal.   Lab Findings: Lab Results  Component Value Date   WBC 6.3 03/04/2021   HGB 9.3 (L) 03/04/2021   HCT 29.2 (L) 03/04/2021   MCV 98.6 03/04/2021   PLT 162 03/04/2021    Radiographic Findings: PCV CAROTID DUPLEX (BILATERAL)  Result Date: 07/19/2021 Carotid artery duplex 07/16/2021: Duplex suggests stenosis in the right internal carotid artery (50-69%). Duplex suggests stenosis in the right external carotid artery (<50%). Duplex suggests stenosis in the left internal carotid artery (50-69%). Duplex suggests stenosis in the left external carotid artery (<50%). Antegrade  right vertebral artery flow. Antegrade left vertebral artery flow. No prior studies for comparison. Follow up in six months is appropriate if clinically indicated.   Impression:  Recurrent FIGO stage IA (cT1a, cN0, cM0) endometrial adenocarcinoma, grade 1  No evidence of recurrence on clinical exam today.  The patient does not appear to have any lasting effects from her surgery and vaginal brachytherapy.  Plan: The patient is scheduled for follow-up with Dr. Berline Lopes in late December.  She will follow-up with radiation oncology in late March 2023.   22 minutes of total time was spent for this patient encounter, including preparation, face-to-face counseling with the patient and coordination of care, physical exam, and documentation of the encounter. ____________________________________  Blair Promise, PhD, MD   This document serves as a record of services personally performed by Gery Pray, MD. It was created on his behalf by Roney Mans, a trained medical scribe. The creation of this record is based on the scribe's personal observations and the provider's statements to them. This document has been checked and approved by the attending provider.

## 2021-07-23 ENCOUNTER — Ambulatory Visit
Admission: RE | Admit: 2021-07-23 | Discharge: 2021-07-23 | Disposition: A | Discharge status: Home / Self Care | Payer: BC Managed Care – PPO | Source: Ambulatory Visit | Attending: Radiation Oncology | Admitting: Radiation Oncology

## 2021-07-23 ENCOUNTER — Encounter: Payer: Self-pay | Admitting: Radiation Oncology

## 2021-07-23 ENCOUNTER — Other Ambulatory Visit: Payer: Self-pay

## 2021-07-23 DIAGNOSIS — Z79899 Other long term (current) drug therapy: Secondary | ICD-10-CM | POA: Insufficient documentation

## 2021-07-23 DIAGNOSIS — Z08 Encounter for follow-up examination after completed treatment for malignant neoplasm: Secondary | ICD-10-CM | POA: Diagnosis not present

## 2021-07-23 DIAGNOSIS — C541 Malignant neoplasm of endometrium: Secondary | ICD-10-CM | POA: Diagnosis not present

## 2021-07-23 DIAGNOSIS — Z7982 Long term (current) use of aspirin: Secondary | ICD-10-CM | POA: Insufficient documentation

## 2021-07-23 DIAGNOSIS — Z923 Personal history of irradiation: Secondary | ICD-10-CM | POA: Insufficient documentation

## 2021-07-23 DIAGNOSIS — K5900 Constipation, unspecified: Secondary | ICD-10-CM | POA: Diagnosis not present

## 2021-07-23 DIAGNOSIS — Z8542 Personal history of malignant neoplasm of other parts of uterus: Secondary | ICD-10-CM | POA: Diagnosis not present

## 2021-07-23 NOTE — Progress Notes (Signed)
Bridget Lamb is here today for follow up post radiation to the pelvis   They completed their radiation on: 07/24/2020   Does the patient complain of any of the following:   Pain: Patient denies pain. Abdominal bloating: no Diarrhea/Constipation: no Nausea/Vomiting: no Vaginal Discharge: no Blood in Urine or Stool: no Urinary Issues (dysuria/incomplete emptying/ incontinence/ increased frequency/urgency):   Patient reports having urinary frequency.  Does patient report using vaginal dilator 2-3 times a week and/or sexually active 2-3 weeks: Patient reports using dilator 2-3 times per week.  Post radiation skin changes: no Last GYN-onc visit: Saw Dr. Jeral Pinch on 04/21/2021     Additional comments if applicable: Vitals:   95/36/92 0938  BP: (!) 152/60  Pulse: (!) 56  Resp: 20  Temp: 98.3 F (36.8 C)  SpO2: 94%  Weight: 245 lb (111.1 kg)  Height: 5\' 4"  (1.626 m)

## 2021-07-25 ENCOUNTER — Other Ambulatory Visit: Payer: Self-pay | Admitting: Student

## 2021-07-25 DIAGNOSIS — I10 Essential (primary) hypertension: Secondary | ICD-10-CM

## 2021-07-25 DIAGNOSIS — I5032 Chronic diastolic (congestive) heart failure: Secondary | ICD-10-CM

## 2021-07-29 DIAGNOSIS — R002 Palpitations: Secondary | ICD-10-CM | POA: Diagnosis not present

## 2021-08-20 DIAGNOSIS — E785 Hyperlipidemia, unspecified: Secondary | ICD-10-CM | POA: Diagnosis not present

## 2021-08-20 DIAGNOSIS — I509 Heart failure, unspecified: Secondary | ICD-10-CM | POA: Diagnosis not present

## 2021-08-20 DIAGNOSIS — E039 Hypothyroidism, unspecified: Secondary | ICD-10-CM | POA: Diagnosis not present

## 2021-08-20 DIAGNOSIS — I10 Essential (primary) hypertension: Secondary | ICD-10-CM | POA: Diagnosis not present

## 2021-09-16 DIAGNOSIS — N183 Chronic kidney disease, stage 3 unspecified: Secondary | ICD-10-CM | POA: Diagnosis not present

## 2021-09-16 DIAGNOSIS — R809 Proteinuria, unspecified: Secondary | ICD-10-CM | POA: Diagnosis not present

## 2021-09-16 DIAGNOSIS — D649 Anemia, unspecified: Secondary | ICD-10-CM | POA: Diagnosis not present

## 2021-09-16 DIAGNOSIS — I129 Hypertensive chronic kidney disease with stage 1 through stage 4 chronic kidney disease, or unspecified chronic kidney disease: Secondary | ICD-10-CM | POA: Diagnosis not present

## 2021-10-15 ENCOUNTER — Other Ambulatory Visit: Payer: Self-pay | Admitting: Student

## 2021-10-15 DIAGNOSIS — I10 Essential (primary) hypertension: Secondary | ICD-10-CM

## 2021-10-15 DIAGNOSIS — I5032 Chronic diastolic (congestive) heart failure: Secondary | ICD-10-CM

## 2021-10-20 NOTE — Progress Notes (Signed)
Gynecologic Oncology Return Clinic Visit  10/21/21  Reason for Visit: surveillance in the setting of a history of endometrial cancer  Treatment History: Oncology History Overview Note  CA-125  10/12/19: 48.9  MSI stable   Endometrial cancer (Bridget Lamb)  09/19/2019 Initial Diagnosis   Endometrial cancer (Bridget Lamb)   09/19/2019 Initial Biopsy   EMB: EMCA with clear cell features   10/17/2019 Imaging   CT A/P: Mild endometrial thickening in this patient with known endometrial cancer. No evidence of metastatic disease in the abdomen/pelvis.  CXR 12/28: No evidence of acute cardiopulmonary abnormality.   10/24/2019 Cancer Staging   Staging form: Corpus Uteri - Carcinoma and Carcinosarcoma, AJCC 8th Edition - Clinical stage from 10/24/2019: FIGO Stage IA (cT1a, cN0(sn), cM0) - Signed by Lafonda Mosses, MD on 05/01/2020    10/25/2019 Surgery   TRH/BSO, bil SLN, repair of sulcal tear Findings: On EUA, small cervix, moderately mobile uterus, no adnexal masses.  On intra-abdominal entry, adhesions from the omentum to the anterior wall at the patient's prior hernia repair as well as to the lateral anterior wall bilaterally.  Right aspect of the liver and diaphragm of secured by adhesions.  Left liver edge, diaphragm and stomach normal in appearance.  Uterus 10 cm in bulbous.  Posterior cul-de-sac completely obliterated and bilateral fallopian tubes and adnexa adherent to posterior uterus and sigmoid mesentery.  Fallopian tubes and left ovary somewhat concerning for involvement by tumor versus prior history of pelvic infection or endometriosis.  Mapping successful to bilateral external iliac sentinel lymph nodes.  Small and large bowel normal appearing.  No intra-abdominal evidence of disease.  Significant right sulcal tear noted after delivery of specimen through the vagina, repaired robotically as well as vaginally.   10/25/2019 Cancer Staging   Endometrial cancer, Stage IA, grade 1  endometrioid MI <50%, no LVSI, negative SLNs   05/01/2020 Relapse/Recurrence   A. VAGINAL CUFF, BIOPSY:  - Adenocarcinoma, see comment.   COMMENT:   The adenocarcinoma is similar in appearance to the patient's prior  endometrioid adenocarcinoma ([id number], reviewed).    05/05/2020 Imaging   CT C/A/P: No acute intra-abdominal or pelvic pathology.   05/28/2020 - 07/24/2020 Radiation Therapy    Radiation Treatment Dates: 05/28/2020 through 07/24/2020 Site Technique Total Dose (Gy) Dose per Fx (Gy) Completed Fx Beam Energies  Pelvis: Pelvis IMRT 45/45 1.8 25/25 6X  Vagina: Pelvis_Bst HDR-brachy 24/24 6 4/4 Ir-192        Interval History: Completed radiation treatment for recurrence in late September 2021.  Doing well since her last visit although notes multiple hospitalizations for breathing issues related to heart failure.  She continues to struggle intermittently with shortness of breath.  Denies vaginal bleeding or discharge.  Has occasional pelvic pain that feels like "ovarian" twinges.  Reports normal bowel function.  Continues to have frequency especially when she takes her antihypertensive medications.  Past Medical/Surgical History: Past Medical History:  Diagnosis Date   Allergic rhinitis    Anemia    hematology, Dr. Julien Nordmann, last 01/2011   Asthma    Cervix cancer ([place])    CHF (congestive heart failure) ([place]) 2004   Chronic kidney disease    protein in urine   Complication of anesthesia    Dyslipidemia    Dyspnea    on excertion   Endometrial cancer ([place])    H/O echocardiogram 01/07/04   mild LVH, nl LV systolic function, EF 83%, left atrial enlargement, mildly thickened aortic and mitral valves   History of radiation therapy  8/4-21-07/24/20   Pelvis IMRT and HDR, Dr. Gery Pray   History of thyroid cancer    Hypertension    Incarcerated ventral hernia 10/07/2013   Obesity, Class III, BMI 40-49.9 (morbid obesity) ([place]) 10/09/2013   Osteopenia    PONV  (postoperative nausea and vomiting)    Sleep apnea    Umbilical hernia    Unspecified essential hypertension 10/09/2013   Unspecified hypothyroidism 10/09/2013    Past Surgical History:  Procedure Laterality Date   BONE MARROW BIOPSY     CARDIAC CATHETERIZATION  2005   CHOLECYSTECTOMY  2003   COLONOSCOPY  2008   ROBOTIC ASSISTED TOTAL HYSTERECTOMY WITH BILATERAL SALPINGO OOPHERECTOMY N/A 10/25/2019   Procedure: XI ROBOTIC ASSISTED TOTAL HYSTERECTOMY WITH BILATERAL SALPINGO OOPHORECTOMY;  Surgeon: Lafonda Mosses, MD;  Location: [facility];  Service: Gynecology;  Laterality: N/A;   SENTINEL NODE BIOPSY N/A 10/25/2019   Procedure: SENTINEL LYMPH NODE BIOPSY;  Surgeon: Lafonda Mosses, MD;  Location: [facility];  Service: Gynecology;  Laterality: N/A;   THYROID SURGERY     partial thyroidectomy   TOTAL THYROIDECTOMY  2009   VENTRAL HERNIA REPAIR N/A 10/07/2013   Procedure: HERNIA REPAIR VENTRAL ADULT;  Surgeon: Gwenyth Ober, MD;  Location: [facility];  Service: General;  Laterality: N/A;    Family History  Problem Relation Age of Onset   Hypertension Mother    Pancreatic cancer Mother    Breast cancer Maternal Aunt    Lung cancer Father    Ovarian cancer Neg Hx    Endometrial cancer Neg Hx    Colon cancer Neg Hx     Social History   Socioeconomic History   Marital status: Divorced    Spouse name: Not on file   Number of children: 1   Years of education: Not on file   Highest education level: Not on file  Occupational History   Not on file  Tobacco Use   Smoking status: Never   Smokeless tobacco: Never  Vaping Use   Vaping Use: Never used  Substance and Sexual Activity   Alcohol use: No   Drug use: No   Sexual activity: Not on file  Other Topics Concern   Not on file  Social History Narrative   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[employer], is on her feet for 8-hour shifts   Social Determinants of Health   Financial Resource Strain: Medium Risk   Difficulty of  Paying Living Expenses: Somewhat hard  Food Insecurity: Landscape architect Present   Worried About Charity fundraiser in the Last Year: Never true   [place] in the Last Year: Sometimes true  Transportation Needs: No Transportation Needs   Lack of Transportation (Medical): No   Lack of Transportation (Non-Medical): No  Physical Activity: Not on file  Stress: Not on file  Social Connections: Not on file    Current Medications:  Current Outpatient Medications:    albuterol (PROVENTIL HFA;VENTOLIN HFA) 108 (90 Base) MCG/ACT inhaler, Inhale 2 puffs into the lungs every 4 (four) hours as needed for wheezing or shortness of breath., Disp: 1 Inhaler, Rfl: 0   aspirin EC 81 MG tablet, Take 81 mg by mouth daily., Disp: , Rfl:    Calcium Carbonate-Vitamin D (CALCIUM 600+D PO), Take 1 tablet by mouth 2 (two) times daily., Disp: , Rfl:    dapagliflozin propanediol (FARXIGA) 10 MG TABS tablet, Take 1 tablet (10 mg total) by mouth daily., Disp: 30 tablet, Rfl: 2  FeFum-FePoly-FA-B Cmp-C-Biot (INTEGRA PLUS) CAPS, Take 1 capsule by mouth daily., Disp: , Rfl:    fluticasone-salmeterol (ADVAIR) 250-50 MCG/ACT AEPB, Inhale 1 puff into the lungs in the morning and at bedtime., Disp: , Rfl:    levothyroxine (SYNTHROID) 125 MCG tablet, Take 150 mcg by mouth., Disp: , Rfl:    loratadine (CLARITIN) 10 MG tablet, Take 10 mg by mouth daily., Disp: , Rfl:    Nebivolol HCl 20 MG TABS, Take 1 tablet by mouth once daily, Disp: 90 tablet, Rfl: 0   olopatadine (PATANOL) 0.1 % ophthalmic solution, Place 1 drop into both eyes 2 (two) times daily. , Disp: , Rfl:    rosuvastatin (CRESTOR) 10 MG tablet, Take 10 mg by mouth at bedtime., Disp: , Rfl:    senna-docusate (SENOKOT-S) 8.6-50 MG tablet, Take 1 tablet by mouth 2 (two) times daily as needed for moderate constipation., Disp:  , Rfl:    torsemide (DEMADEX) 20 MG tablet, Take 1 tablet by mouth once daily, Disp: 90 tablet, Rfl: 0   valsartan (DIOVAN) 40 MG tablet,  Take 1 tablet (40 mg total) by mouth daily., Disp: 30 tablet, Rfl: 11  Review of Systems: Pertinent positives include shortness of breath, urinary frequency, joint pain, bruising/bleeding easily. Denies appetite changes, fevers, chills, fatigue, unexplained weight changes. Denies hearing loss, neck lumps or masses, mouth sores, ringing in ears or voice changes. Denies cough or wheezing.  Denies chest pain or palpitations. Denies leg swelling. Denies abdominal distention, pain, blood in stools, constipation, diarrhea, nausea, vomiting, or early satiety. Denies pain with intercourse, dysuria, hematuria or incontinence. Denies hot flashes, pelvic pain, vaginal bleeding or vaginal discharge.   Denies back pain or muscle pain/cramps. Denies itching, rash, or wounds. Denies dizziness, headaches, numbness or seizures. Denies swollen lymph nodes or glands. Denies anxiety, depression, confusion, or decreased concentration.  Physical Exam: BP (!) 148/52 (BP Location: Left Arm, Patient Position: Sitting)    Pulse 60    Temp 97.9 F (36.6 C) (Tympanic)    Resp 16    Ht $R'5\' 4"'Yz$  (1.626 m)    Wt 245 lb 12.8 oz (111.5 kg)    LMP 10/22/2011    SpO2 100%    BMI 42.19 kg/m  General: Alert, oriented, no acute distress. HEENT: Normocephalic, atraumatic, sclera anicteric. Chest: Unlabored breathing on room air. Cardiovascular: Regular rate and rhythm. Abdomen: Obese, soft, nontender.  Normoactive bowel sounds.  No masses or hepatosplenomegaly appreciated.  Well-healed incisions. Extremities: Grossly normal range of motion.  Warm, well perfused.  Trace edema bilaterally. Skin: No rashes or lesions noted. Lymphatics: No cervical, supraclavicular, or inguinal adenopathy. GU: Normal appearing external genitalia without erythema, excoriation, or lesions.  Speculum exam reveals small area on the anterior vagina approximately 1-2 cm distal to the cuff that is mildly erythematous.  Bimanual exam reveals cuff is smooth,  no nodularity or masses, some narrowing of the upper vagina noted.  Rectovaginal exam confirms these findings.  Vaginal biopsy: Preoperative diagnosis: Anterior vaginal lesion Postoperative diagnosis same as above Procedure: Vaginal biopsy Estimated blood loss: Minimal Physician: Berline Lopes MD Specimens: Vaginal biopsy Procedure: After the procedure was discussed with the patient including risks and benefits, she gave verbal consent.  She was placed in dorsolithotomy position and a vaginal lesion was visualized.  It was cleansed with Betadine x3.  Area was attempted in multiple ways to be biopsied with Tischler forceps.  Given vaginal changes related to treatment as well as location of the biopsy, only scant tissue obtained.  This  was placed in formalin to be sent.  Hemostasis was noted.  All instruments were removed from the vagina.  Patient tolerated the procedure well.  Laboratory & Radiologic Studies: None new  Assessment & Plan: Bridget Lamb is a 65 y.o. woman with early stage uterine cancer who presented in July 2021 with vaginal bleeding found to have a recurrence at the vaginal cuff treated with RT completed in 06/2020.  Overall doing well today.  Area seen on the anterior vagina that may be related to radiation changes.  Biopsy attempted but very little tissue obtained.  This will be sent to pathology.  I will call the patient when I have the results.  We will discuss if nondiagnostic need for further biopsy either back in clinic or under light anesthesia.  I do not palpate any abnormality along the tissue on bimanual exam.   I encouraged her to continue using vaginal dilator as she has been.   We will continue with visits every 3 months until she is to years out from her recurrence.  She is seeing Dr. Sondra Come in 3 months and will come back to see me in 6 months.   28 minutes of total time was spent for this patient encounter, including preparation, face-to-face counseling with the  patient and coordination of care, and documentation of the encounter.  Jeral Pinch, MD  Division of Gynecologic Oncology  Department of Obstetrics and Gynecology  [facility]

## 2021-10-21 ENCOUNTER — Other Ambulatory Visit: Payer: Self-pay

## 2021-10-21 ENCOUNTER — Inpatient Hospital Stay: Payer: BC Managed Care – PPO | Attending: Gynecologic Oncology | Admitting: Gynecologic Oncology

## 2021-10-21 ENCOUNTER — Encounter: Payer: Self-pay | Admitting: Gynecologic Oncology

## 2021-10-21 VITALS — BP 148/52 | HR 60 | Temp 97.9°F | Resp 16 | Ht 64.0 in | Wt 245.8 lb

## 2021-10-21 DIAGNOSIS — Z79899 Other long term (current) drug therapy: Secondary | ICD-10-CM | POA: Insufficient documentation

## 2021-10-21 DIAGNOSIS — E039 Hypothyroidism, unspecified: Secondary | ICD-10-CM | POA: Diagnosis not present

## 2021-10-21 DIAGNOSIS — I509 Heart failure, unspecified: Secondary | ICD-10-CM | POA: Diagnosis not present

## 2021-10-21 DIAGNOSIS — N898 Other specified noninflammatory disorders of vagina: Secondary | ICD-10-CM

## 2021-10-21 DIAGNOSIS — E785 Hyperlipidemia, unspecified: Secondary | ICD-10-CM | POA: Diagnosis not present

## 2021-10-21 DIAGNOSIS — E119 Type 2 diabetes mellitus without complications: Secondary | ICD-10-CM | POA: Diagnosis not present

## 2021-10-21 DIAGNOSIS — Z85850 Personal history of malignant neoplasm of thyroid: Secondary | ICD-10-CM | POA: Insufficient documentation

## 2021-10-21 DIAGNOSIS — N189 Chronic kidney disease, unspecified: Secondary | ICD-10-CM | POA: Insufficient documentation

## 2021-10-21 DIAGNOSIS — C541 Malignant neoplasm of endometrium: Secondary | ICD-10-CM

## 2021-10-21 DIAGNOSIS — Z7982 Long term (current) use of aspirin: Secondary | ICD-10-CM | POA: Insufficient documentation

## 2021-10-21 DIAGNOSIS — Z90710 Acquired absence of both cervix and uterus: Secondary | ICD-10-CM | POA: Insufficient documentation

## 2021-10-21 DIAGNOSIS — C7982 Secondary malignant neoplasm of genital organs: Secondary | ICD-10-CM

## 2021-10-21 DIAGNOSIS — Z90722 Acquired absence of ovaries, bilateral: Secondary | ICD-10-CM | POA: Diagnosis not present

## 2021-10-21 DIAGNOSIS — Z7984 Long term (current) use of oral hypoglycemic drugs: Secondary | ICD-10-CM | POA: Diagnosis not present

## 2021-10-21 DIAGNOSIS — Z923 Personal history of irradiation: Secondary | ICD-10-CM | POA: Insufficient documentation

## 2021-10-21 DIAGNOSIS — I130 Hypertensive heart and chronic kidney disease with heart failure and stage 1 through stage 4 chronic kidney disease, or unspecified chronic kidney disease: Secondary | ICD-10-CM | POA: Diagnosis not present

## 2021-10-21 NOTE — Patient Instructions (Signed)
It was good to see you today.  I will call you when I get the biopsy results back.  I was not able to get very much tissue.  If they do not see enough to evaluate, then we may need to talk about trying to biopsy again here in clinic or doing it under some very light anesthesia.  If biopsy shows normal tissue, then we will plan for follow-up in 6 months as we would normally do.  As always, if you develop any new symptoms between visits, such as vaginal bleeding or pelvic pain, please call to see me sooner.

## 2021-10-23 ENCOUNTER — Telehealth: Payer: Self-pay | Admitting: Gynecologic Oncology

## 2021-10-23 ENCOUNTER — Telehealth: Payer: Self-pay

## 2021-10-23 LAB — SURGICAL PATHOLOGY

## 2021-10-23 NOTE — Telephone Encounter (Signed)
Called Mrs. Yarde back to let her know that Dr. Berline Lopes will be reviewing her biopsy results and will give her a call.

## 2021-10-23 NOTE — Telephone Encounter (Signed)
Patient called regarding biopsy results.  I told her Dr. Berline Lopes or Lenna Sciara, NP has not had a chance to review them yet and will return her call with results. Patient verbalized understanding.

## 2021-10-23 NOTE — Telephone Encounter (Signed)
Called patient, discussed biopsy results.  Benign mucosa with granulation tissue noted.  Although specimen was scant, I feel reassured by these findings as well as my overall low concern on exam.  We will plan to continue with surveillance visits.  Jeral Pinch MD Gynecologic Oncology

## 2021-12-28 ENCOUNTER — Encounter: Payer: Self-pay | Admitting: Student

## 2021-12-28 ENCOUNTER — Ambulatory Visit: Payer: BC Managed Care – PPO | Admitting: Student

## 2021-12-28 ENCOUNTER — Other Ambulatory Visit: Payer: Self-pay

## 2021-12-28 VITALS — BP 135/55 | HR 67 | Temp 98.7°F | Resp 16 | Ht 64.0 in | Wt 251.6 lb

## 2021-12-28 DIAGNOSIS — L819 Disorder of pigmentation, unspecified: Secondary | ICD-10-CM

## 2021-12-28 DIAGNOSIS — I5032 Chronic diastolic (congestive) heart failure: Secondary | ICD-10-CM | POA: Diagnosis not present

## 2021-12-28 DIAGNOSIS — R0602 Shortness of breath: Secondary | ICD-10-CM | POA: Diagnosis not present

## 2021-12-28 NOTE — Progress Notes (Signed)
Primary Physician/Referring:  [name], PA-C  Patient ID: Bridget Lamb, female    DOB: July 02, 1956, 66 y.o.   MRN: [id number]  Chief Complaint  Patient presents with   Shortness of Breath   Follow-up   HPI:    Bridget Lamb  is a 66 y.o. female with morbid obesity BMI 44, hypertension, restrictive airway disease due to bronchial asthma, hyperlipidemia, HFpEF, OSA now compliant with CPAP.  Patient has had multiple hospitalizations with acute on chronic diastolic heart failure in 02/2020 and again 02/2021.    Patient presents for urgent visit with concerns that he fingertips have intermittently been "turning blue in the cold" then today at work her coworkers noticed patient's lips also appeared bluish. Patient is otherwise feeling well without chest pain or worsening dyspnea. She continues to have bilateral leg edema which is chronic and stable. Patient states her fingers have been turning bluish intermittently over the last several months, particularly when she it cold and resolves spontaneously when she warms up.   Past Medical History:  Diagnosis Date   Allergic rhinitis    Anemia    hematology, Dr. Julien Nordmann, last 01/2011   Asthma    Cervix cancer ([place])    CHF (congestive heart failure) ([place]) 2004   Chronic kidney disease    protein in urine   Complication of anesthesia    Dyslipidemia    Dyspnea    on excertion   Endometrial cancer ([place])    H/O echocardiogram 01/07/04   mild LVH, nl LV systolic function, EF 66%, left atrial enlargement, mildly thickened aortic and mitral valves   History of radiation therapy 8/4-21-07/24/20   Pelvis IMRT and HDR, Dr. Gery Pray   History of thyroid cancer    Hypertension    Incarcerated ventral hernia 10/07/2013   Obesity, Class III, BMI 40-49.9 (morbid obesity) ([place]) 10/09/2013   Osteopenia    PONV (postoperative nausea and vomiting)    Sleep apnea    Umbilical hernia    Unspecified essential hypertension 10/09/2013    Unspecified hypothyroidism 10/09/2013   Past Surgical History:  Procedure Laterality Date   BONE MARROW BIOPSY     CARDIAC CATHETERIZATION  2005   CHOLECYSTECTOMY  2003   COLONOSCOPY  2008   ROBOTIC ASSISTED TOTAL HYSTERECTOMY WITH BILATERAL SALPINGO OOPHERECTOMY N/A 10/25/2019   Procedure: XI ROBOTIC ASSISTED TOTAL HYSTERECTOMY WITH BILATERAL SALPINGO OOPHORECTOMY;  Surgeon: Lafonda Mosses, MD;  Location: [facility];  Service: Gynecology;  Laterality: N/A;   SENTINEL NODE BIOPSY N/A 10/25/2019   Procedure: SENTINEL LYMPH NODE BIOPSY;  Surgeon: Lafonda Mosses, MD;  Location: [facility];  Service: Gynecology;  Laterality: N/A;   THYROID SURGERY     partial thyroidectomy   TOTAL THYROIDECTOMY  2009   VENTRAL HERNIA REPAIR N/A 10/07/2013   Procedure: HERNIA REPAIR VENTRAL ADULT;  Surgeon: Gwenyth Ober, MD;  Location: [facility];  Service: General;  Laterality: N/A;   Family History  Problem Relation Age of Onset   Hypertension Mother    Pancreatic cancer Mother    Breast cancer Maternal Aunt    Lung cancer Father    Ovarian cancer Neg Hx    Endometrial cancer Neg Hx    Colon cancer Neg Hx     Social History   Tobacco Use   Smoking status: Never   Smokeless tobacco: Never  Substance Use Topics   Alcohol use: No   Marital Status: Divorced  ROS  Review of Systems  Constitutional: Negative  for malaise/fatigue and weight gain.  Cardiovascular:  Positive for dyspnea on exertion (chronic, stable) and leg swelling (minimal, wears compression stockings - stable). Negative for chest pain, claudication, near-syncope, palpitations, paroxysmal nocturnal dyspnea and syncope.  Respiratory:  Positive for snoring (now compliant with CPAP). Negative for shortness of breath (improved).   Neurological:  Negative for dizziness.   Objective  Blood pressure (!) 135/55, pulse 67, temperature 98.7 F (37.1 C), temperature source Temporal, resp. rate 16, height _0  (1.626 m), weight 251 lb 9.6 oz  (114.1 kg), last menstrual period 10/22/2011, SpO2 95 %.  Vitals with BMI 12/28/2021 10/21/2021 07/23/2021  Height _1  _2  _3   Weight 251 lbs 10 oz 245 lbs 13 oz 245 lbs  BMI 43.17 99.37 16.96  Systolic 789 381 017  Diastolic 55 52 60  Pulse 67 60 56     Physical Exam Vitals reviewed.  Constitutional:      General: She is not in acute distress.    Appearance: She is well-developed.     Comments: Morbidly obese  Cardiovascular:     Rate and Rhythm: Normal rate and regular rhythm.     Pulses: Intact distal pulses.          Carotid pulses are  on the right side with bruit and  on the left side with bruit.      Radial pulses are 2+ on the right side and 2+ on the left side.     Heart sounds: S1 normal and S2 normal. Murmur heard.  Early systolic murmur is present with a grade of 1/6 at the apex.  Harsh mid to late systolic murmur is also present at the upper right sternal border.    No gallop.     Comments: No JVD  Pulmonary:     Effort: Pulmonary effort is normal. No accessory muscle usage or respiratory distress.     Breath sounds: Normal breath sounds. No wheezing, rhonchi or rales.  Abdominal:     Comments: Pannus present  Musculoskeletal:        General: No tenderness.     Right lower leg: Edema (trace) present.     Left lower leg: Edema (trace) present.  Skin:    General: Skin is warm and dry.     Capillary Refill: Capillary refill takes less than 2 seconds.     Comments: Mild bluish discoloration or fingertips bilaterally  Neurological:     Mental Status: She is alert.   Laboratory examination:   Recent Labs    03/02/21 0829 03/03/21 0447 03/04/21 0334  NA 141 142 140  K 3.6 3.9 3.7  CL 103 101 96*  CO2 32 34* 36*  GLUCOSE 129* 94 94  BUN 36* 30* 23  CREATININE 1.33* 1.28* 1.26*  CALCIUM 8.7* 9.0 9.2  GFRNONAA 44* 46* 47*   CrCl cannot be calculated (Patient's most recent lab result is older than the maximum 21 days allowed.).  CMP Latest Ref Rng &  Units 03/04/2021 03/03/2021 03/02/2021  Glucose 70 - 99 mg/dL 94 94 129(H)  BUN 8 - 23 mg/dL 23 30(H) 36(H)  Creatinine 0.44 - 1.00 mg/dL 1.26(H) 1.28(H) 1.33(H)  Sodium 135 - 145 mmol/L 140 142 141  Potassium 3.5 - 5.1 mmol/L 3.7 3.9 3.6  Chloride 98 - 111 mmol/L 96(L) 101 103  CO2 22 - 32 mmol/L 36(H) 34(H) 32  Calcium 8.9 - 10.3 mg/dL 9.2 9.0 8.7(L)  Total Protein 6.5 - 8.1 g/dL - - 5.9(L)  Total Bilirubin 0.3 - 1.2 mg/dL - - 0.4  Alkaline Phos 38 - 126 U/L - - 62  AST 15 - 41 U/L - - 18  ALT 0 - 44 U/L - - 17   CBC Latest Ref Rng & Units 03/04/2021 03/03/2021 03/02/2021  WBC 4.0 - 10.5 K/uL 6.3 5.5 5.4  Hemoglobin 12.0 - 15.0 g/dL 9.3(L) 9.1(L) 9.3(L)  Hematocrit 36.0 - 46.0 % 29.2(L) 28.9(L) 29.5(L)  Platelets 150 - 400 K/uL 162 139(L) 164   TSH No results for input(s): TSH in the last 8760 hours. BNP (last 3 results) Recent Labs    02/28/21 0239 03/03/21 0447 03/04/21 0334  BNP 873.8* 269.7* 272.5*   Magnesium:  Recent Labs    03/02/21 0829 03/03/21 0447 03/04/21 0334  MG 2.2 2.3 2.3    External labs:  03/16/2021: NT proBNP 1054 Glucose 96, BUN 29, creatinine 1.53, GFR 30, sodium 142, potassium 4.9, AST 16, ALT 13, alk phos 99  02/28/2021: HDL 49, LDL 57, total cholesterol 117, triglycerides 54 A1c 5.4% BUN 23, creatinine 1.26,   12/15/2020: NT proBNP 820 Glucose 96, BUN 37, creatinine 1.52, GFR 36, sodium 146, potassium 4.4  12/09/2020: proBNP 1805 Total cholesterol 120, triglycerides 82, HDL 48, LDL 56, non-HDL 72 Glucose 100, BUN 25, creatinine 1.5, GFR 36, sodium 144, potassium 4.6  08/28/2020: Creatinine 1.27, EGFR 44, BUN 19, sodium 146, potassium 4.3    Allergies   Allergies  Allergen Reactions   Procardia [Nifedipine] Other (See Comments)    Ineffective     Medications Prior to Visit:   Outpatient Medications Prior to Visit  Medication Sig Dispense Refill   albuterol (PROVENTIL HFA;VENTOLIN HFA) 108 (90 Base) MCG/ACT inhaler Inhale 2 puffs into  the lungs every 4 (four) hours as needed for wheezing or shortness of breath. 1 Inhaler 0   aspirin EC 81 MG tablet Take 81 mg by mouth daily.     Calcium Carbonate-Vitamin D (CALCIUM 600+D PO) Take 1 tablet by mouth 2 (two) times daily.     dapagliflozin propanediol (FARXIGA) 10 MG TABS tablet Take 1 tablet (10 mg total) by mouth daily. 30 tablet 2   FeFum-FePoly-FA-B Cmp-C-Biot (INTEGRA PLUS) CAPS Take 1 capsule by mouth daily.     fluticasone-salmeterol (ADVAIR) 250-50 MCG/ACT AEPB Inhale 1 puff into the lungs in the morning and at bedtime.     levothyroxine (SYNTHROID) 125 MCG tablet Take 150 mcg by mouth.     loratadine (CLARITIN) 10 MG tablet Take 10 mg by mouth daily.     Nebivolol HCl 20 MG TABS Take 1 tablet by mouth once daily 90 tablet 0   olopatadine (PATANOL) 0.1 % ophthalmic solution Place 1 drop into both eyes 2 (two) times daily.      rosuvastatin (CRESTOR) 10 MG tablet Take 10 mg by mouth at bedtime.     senna-docusate (SENOKOT-S) 8.6-50 MG tablet Take 1 tablet by mouth 2 (two) times daily as needed for moderate constipation.     torsemide (DEMADEX) 20 MG tablet Take 1 tablet by mouth once daily 90 tablet 0   valsartan (DIOVAN) 40 MG tablet Take 1 tablet (40 mg total) by mouth daily. 30 tablet 11   No facility-administered medications prior to visit.   Final Medications at End of Visit    Current Meds  Medication Sig   albuterol (PROVENTIL HFA;VENTOLIN HFA) 108 (90 Base) MCG/ACT inhaler Inhale 2 puffs into the lungs every 4 (four) hours as needed for wheezing or shortness of breath.  aspirin EC 81 MG tablet Take 81 mg by mouth daily.   Calcium Carbonate-Vitamin D (CALCIUM 600+D PO) Take 1 tablet by mouth 2 (two) times daily.   dapagliflozin propanediol (FARXIGA) 10 MG TABS tablet Take 1 tablet (10 mg total) by mouth daily.   FeFum-FePoly-FA-B Cmp-C-Biot (INTEGRA PLUS) CAPS Take 1 capsule by mouth daily.   fluticasone-salmeterol (ADVAIR) 250-50 MCG/ACT AEPB Inhale 1 puff  into the lungs in the morning and at bedtime.   levothyroxine (SYNTHROID) 125 MCG tablet Take 150 mcg by mouth.   loratadine (CLARITIN) 10 MG tablet Take 10 mg by mouth daily.   Nebivolol HCl 20 MG TABS Take 1 tablet by mouth once daily   olopatadine (PATANOL) 0.1 % ophthalmic solution Place 1 drop into both eyes 2 (two) times daily.    rosuvastatin (CRESTOR) 10 MG tablet Take 10 mg by mouth at bedtime.   senna-docusate (SENOKOT-S) 8.6-50 MG tablet Take 1 tablet by mouth 2 (two) times daily as needed for moderate constipation.   torsemide (DEMADEX) 20 MG tablet Take 1 tablet by mouth once daily   valsartan (DIOVAN) 40 MG tablet Take 1 tablet (40 mg total) by mouth daily.    Radiology:   Chest x-ray 02/27/2021: Stable enlarged cardiac silhouette. There is central venous congestion in mild peripheral interstitial markings increased from prior. Small effusions. No focal consolidation. Degenerative osteophytosis of the spine.   IMPRESSION: Cardiomegaly central venous congestion, interstitial prominence, and small effusions suggest volume overload/congestive heart failure.  CT angio chest PE 02/27/2021: 1. No pulmonary embolus or acute aortic syndrome. 2. Cardiomegaly, small pleural effusions and pulmonary edema, likely congestive heart failure. Aortic atherosclerosis (ICD10-I70.0).   CT angiography chest with contrast 02/20/2020: 1. No evidence for acute pulmonary embolus. 2. Patchy ground-glass and consolidative opacities throughout the aerated lungs bilaterally which may represent edema or infection. Additionally, there are scattered areas of consolidation within the lungs bilaterally which may represent atelectasis or infection. Consider follow-up chest CT in 6-8 weeks after resolution of acute symptomatology to ensure the nodularity resolves. 3. Moderate bilateral pleural effusions. 4. Prominent mediastinal lymph nodes, likely reactive. 5. Dilated main pulmonary artery as can be seen  with pulmonary arterial hypertension. 6. Aortic atherosclerosis.  Cardiac Studies:   Lexiscan Sestamibi Stress Test 01/29/2014: There is a very small and very mild fixed defect in the mid anterior wall consistent with breast attenuation.. No reversible  perfusion defects are identified.  Lexiscan Sestamibi stress test 03/10/2020: No previous exam available for comparison. Lexiscan nuclear stress test performed using 1-day protocol. Stress EKG is non-diagnostic, as this is pharmacological stress test. Normal myocardial perfusion. Stress LVEF 59%. Low risk study.   Echocardiogram 03/01/2021: 1. Left ventricular ejection fraction, by estimation, is 55 to 60%. The  left ventricle has normal function. Left ventricular endocardial border  not optimally defined to evaluate regional wall motion. There is mild left  ventricular hypertrophy. Left  ventricular diastolic parameters are consistent with Grade II diastolic  dysfunction (pseudonormalization). Elevated left ventricular end-diastolic  pressure.   2. Right ventricular systolic function is normal. The right ventricular  size is mildly enlarged. Mildly increased right ventricular wall  thickness. There is moderately elevated pulmonary artery systolic  pressure.   3. Left atrial size was moderately dilated.   4. Right atrial size was mildly dilated.   5. The mitral valve is normal in structure. Mild to moderate mitral valve  regurgitation.   6. Tricuspid valve regurgitation is mild to moderate.   7. The aortic valve  is normal in structure. Aortic valve regurgitation is  not visualized. No aortic stenosis is present.   8. The inferior vena cava is dilated in size with <50% respiratory  variability, suggesting right atrial pressure of 15 mmHg.   EKG   07/02/2021: Sinus bradycardia with first-degree AV block at a rate of 59 bpm.  Normal axis.  Poor R wave progression, cannot exclude anteroseptal infarct old.  Incomplete right bundle branch  block.  Low voltage axis, consider pulmonary disease pattern.  Compared to EKG 12/18/2020, no significant change.  Assessment     ICD-10-CM   1. Shortness of breath  R06.02 EKG 12-Lead    CBC    Brain natriuretic peptide    CMP14+EGFR    TSH    2. Discoloration of skin of hand  L81.9 ANA    PCV UPPER ARTERIAL DUPLEX (BILATERAL)    3. Chronic diastolic CHF (congestive heart failure) ([facility])  I50.32 CBC    Brain natriuretic peptide    CMP14+EGFR       No orders of the defined types were placed in this encounter.    There are no discontinued medications.    Recommendations:   Bridget Lamb  is a 66 y.o. female with morbid obesity BMI 44, hypertension, restrictive airway disease due to bronchial asthma, hyperlipidemia, HFpEF, OSA now compliant with CPAP.  Patient has had multiple hospitalizations with acute on chronic diastolic heart failure in 02/2020 and again 02/2021.    Patient presents for urgent visit with concerns of bluish discoloration of her finger tips. She is otherwise asymptomatic. Will obtain labs including CBC, ANA, CMP, BNP, and TSH to evaluate for underlying causes as well as upper extremity arterial duplex although pulses are strong bilaterally.   Suspect patient may have Raynaud phenomenon, could consider starting amlodipine pending results of above ordered testing. In the meantime advised patient regarding conservative measures including cold avoidance.   Follow up in 6 weeks, sooner if needed.    Alethia Berthold, PA-C 12/29/2021, 2:38 PM Office: [phone]

## 2021-12-30 ENCOUNTER — Ambulatory Visit: Payer: BC Managed Care – PPO | Admitting: Student

## 2021-12-31 DIAGNOSIS — I10 Essential (primary) hypertension: Secondary | ICD-10-CM | POA: Diagnosis not present

## 2021-12-31 DIAGNOSIS — E039 Hypothyroidism, unspecified: Secondary | ICD-10-CM | POA: Diagnosis not present

## 2021-12-31 DIAGNOSIS — C541 Malignant neoplasm of endometrium: Secondary | ICD-10-CM | POA: Diagnosis not present

## 2021-12-31 DIAGNOSIS — M79671 Pain in right foot: Secondary | ICD-10-CM | POA: Diagnosis not present

## 2021-12-31 DIAGNOSIS — I509 Heart failure, unspecified: Secondary | ICD-10-CM | POA: Diagnosis not present

## 2021-12-31 DIAGNOSIS — E785 Hyperlipidemia, unspecified: Secondary | ICD-10-CM | POA: Diagnosis not present

## 2022-01-13 ENCOUNTER — Other Ambulatory Visit: Payer: Self-pay

## 2022-01-13 ENCOUNTER — Ambulatory Visit: Payer: BC Managed Care – PPO

## 2022-01-13 DIAGNOSIS — I6523 Occlusion and stenosis of bilateral carotid arteries: Secondary | ICD-10-CM

## 2022-01-13 DIAGNOSIS — R0989 Other specified symptoms and signs involving the circulatory and respiratory systems: Secondary | ICD-10-CM | POA: Diagnosis not present

## 2022-01-13 DIAGNOSIS — L819 Disorder of pigmentation, unspecified: Secondary | ICD-10-CM

## 2022-01-15 ENCOUNTER — Other Ambulatory Visit: Payer: Self-pay | Admitting: Student

## 2022-01-15 DIAGNOSIS — I5032 Chronic diastolic (congestive) heart failure: Secondary | ICD-10-CM

## 2022-01-15 DIAGNOSIS — I10 Essential (primary) hypertension: Secondary | ICD-10-CM

## 2022-01-20 NOTE — Progress Notes (Signed)
?Radiation Oncology         [phone] ?________________________________ ? ?Name: Bridget Lamb MRN: [id number]  ?Date: 01/21/2022  DOB: 1956-07-23 ? ?Follow-Up Visit Note ? ?CC: [name], [name], PA-C ? ?  ICD-10-CM   ?1. Endometrial cancer ([place])  C54.1   ?  ? ? ?Diagnosis: Recurrent FIGO stage IA (cT1a, cN0, cM0) endometrial adenocarcinoma, grade 1 ? ?Interval Since Last Radiation: 1 year and 6 months ? ?Radiation Treatment Dates: 05/28/2020 through 07/24/2020 ?Site Technique Total Dose (Gy) Dose per Fx (Gy) Completed Fx Beam Energies  ?Pelvis: Pelvis IMRT 45/45 1.8 25/25 6X  ?Vagina: Pelvis_Bst HDR-brachy 24/24 6 4/4 Ir-192  ? ? ?Narrative:  The patient returns today for routine 6 month follow-up, she was last seen here for follow-up on 07/23/21. Since her last visit, the patient followed up with Dr. Berline Lopes on 10/21/21. During which time, the patient reported doing well since her last visit (other than her multiple hospitalizations within the last several years for breathing issues related to heart failure).  She also endorsed continued issues with intermittent shortness of breath. She otherwise denied any GU symptoms other than frequency and occasional pelvic pain which she described as "ovarian twinges".      ? ?Of note: the patient did meet with her cardiologist, Lawerance Cruel PA-C on 12/28/21 due to reports of bluish discoloration to her finger tips and lips when she is cold. The patient otherwise denied any other complaints (other than ongoing bilateral leg edema which is chronic and stable). Labs were collected for for further evaluation. Differentials considered (pending labs) included raynauds, and the patient was advised to avoid the cold.  ? ?Of additional note: the patient did have a vaginal biopsy collected on 10/22/21 which showed no evidence of malignancy, with benign squamous mucosa and inflamed granulation tissue. ? ?She continues to use her vaginal dilator.  She  denies any pain with use or bleeding afterward.  She denies any hematuria or rectal bleeding.  No concerning symptoms for recurrence.  ? ?Allergies:  is allergic to procardia [nifedipine]. ? ?Meds: ?Current Outpatient Medications  ?Medication Sig Dispense Refill  ? albuterol (PROVENTIL HFA;VENTOLIN HFA) 108 (90 Base) MCG/ACT inhaler Inhale 2 puffs into the lungs every 4 (four) hours as needed for wheezing or shortness of breath. 1 Inhaler 0  ? aspirin EC 81 MG tablet Take 81 mg by mouth daily.    ? Calcium Carbonate-Vitamin D (CALCIUM 600+D PO) Take 1 tablet by mouth 2 (two) times daily.    ? dapagliflozin propanediol (FARXIGA) 10 MG TABS tablet Take 1 tablet (10 mg total) by mouth daily. 30 tablet 2  ? FeFum-FePoly-FA-B Cmp-C-Biot (INTEGRA PLUS) CAPS Take 1 capsule by mouth daily.    ? fluticasone-salmeterol (ADVAIR) 250-50 MCG/ACT AEPB Inhale 1 puff into the lungs in the morning and at bedtime.    ? levothyroxine (SYNTHROID) 125 MCG tablet Take 150 mcg by mouth.    ? loratadine (CLARITIN) 10 MG tablet Take 10 mg by mouth daily.    ? Nebivolol HCl 20 MG TABS Take 1 tablet by mouth once daily 90 tablet 0  ? olopatadine (PATANOL) 0.1 % ophthalmic solution Place 1 drop into both eyes 2 (two) times daily.     ? rosuvastatin (CRESTOR) 10 MG tablet Take 10 mg by mouth at bedtime.    ? torsemide (DEMADEX) 20 MG tablet Take 1 tablet by mouth once daily 90 tablet 0  ? valsartan (DIOVAN) 40 MG tablet Take  1 tablet (40 mg total) by mouth daily. 30 tablet 11  ? senna-docusate (SENOKOT-S) 8.6-50 MG tablet Take 1 tablet by mouth 2 (two) times daily as needed for moderate constipation. (Patient not taking: Reported on 01/21/2022)    ? ?No current facility-administered medications for this encounter.  ? ? ?Physical Findings: ?The patient is in no acute distress. Patient is alert and oriented. ? height is '5\' 4"'$  (1.626 m) and weight is 245 lb 6.4 oz (111.3 kg). Her temporal temperature is 97.2 ?F (36.2 ?C) (abnormal). Her blood  pressure is 158/53 (abnormal) and her pulse is 58 (abnormal). Her respiration is 18 and oxygen saturation is 100%. .   Lungs are clear to auscultation bilaterally. Heart has regular rate and rhythm. No palpable cervical, supraclavicular, or axillary adenopathy. Abdomen soft, non-tender, normal bowel sounds. ? ?On pelvic examination the external genitalia were unremarkable. A speculum exam was performed. There are no mucosal lesions noted in the vaginal vault.  Erythema is noted to the upper vaginal area in the region of the vaginal cuff consistent with radiation effect.  On bimanual and rectovaginal examination there were no pelvic masses appreciated.  Vaginal cuff intact and smooth.  Rectal sphincter tone slightly decreased. ? ? ? ?Lab Findings: ?Lab Results  ?Component Value Date  ? WBC 6.3 03/04/2021  ? HGB 9.3 (L) 03/04/2021  ? HCT 29.2 (L) 03/04/2021  ? MCV 98.6 03/04/2021  ? PLT 162 03/04/2021  ? ? ?Radiographic Findings: ?PCV CAROTID DUPLEX (BILATERAL) ? ?Result Date: 01/16/2022 ?Carotid artery duplex 01/13/2022: Duplex suggests stenosis in the right internal carotid artery (50-69%). Duplex suggests stenosis in the right external carotid artery (<50%) with heterogeneous plaque. Duplex suggests stenosis in the left internal carotid artery (minimal) with mostly homogeneous plaque. Antegrade right vertebral artery flow. Compared to 07/16/2021, regression of stenosis left ICA. Follow up in six months is appropriate if clinically indicated. ? ?PCV UPPER ARTERIAL DUPLEX (BILATERAL) ? ?Result Date: 01/18/2022 ?Upper extremity arterial duplex 01/13/2022: No significant elevation of the peak systolic velocity is seen in the right or left upper extremity to suggest a hemodynamically significant stenosis. Normal triphasic waveform pattern noted.   ? ?Impression: Recurrent FIGO stage IA (cT1a, cN0, cM0) endometrial adenocarcinoma, grade 1 ? ?No evidence of recurrence on clinical exam today.  Commended her on continued use  of the vaginal dilator. ? ?Plan: She will follow-up with Dr. Berline Lopes in late June.  Routine follow-up in radiation oncology in 6 months. ? ? ?23 minutes of total time was spent for this patient encounter, including preparation, face-to-face counseling with the patient and coordination of care, physical exam, and documentation of the encounter. ?____________________________________ ? ?Blair Promise, PhD, MD ? ? ?This document serves as a record of services personally performed by Gery Pray, MD. It was created on his behalf by Roney Mans, a trained medical scribe. The creation of this record is based on the scribe's personal observations and the provider's statements to them. This document has been checked and approved by the attending provider. ? ?

## 2022-01-21 ENCOUNTER — Ambulatory Visit
Admission: RE | Admit: 2022-01-21 | Discharge: 2022-01-21 | Disposition: A | Discharge status: Home / Self Care | Payer: BC Managed Care – PPO | Source: Ambulatory Visit | Attending: Radiation Oncology | Admitting: Radiation Oncology

## 2022-01-21 ENCOUNTER — Other Ambulatory Visit: Payer: Self-pay

## 2022-01-21 ENCOUNTER — Encounter: Payer: Self-pay | Admitting: Radiation Oncology

## 2022-01-21 DIAGNOSIS — Z08 Encounter for follow-up examination after completed treatment for malignant neoplasm: Secondary | ICD-10-CM | POA: Diagnosis not present

## 2022-01-21 DIAGNOSIS — Z8542 Personal history of malignant neoplasm of other parts of uterus: Secondary | ICD-10-CM | POA: Diagnosis not present

## 2022-01-21 DIAGNOSIS — Z79899 Other long term (current) drug therapy: Secondary | ICD-10-CM | POA: Diagnosis not present

## 2022-01-21 DIAGNOSIS — C541 Malignant neoplasm of endometrium: Secondary | ICD-10-CM

## 2022-01-21 DIAGNOSIS — Z7984 Long term (current) use of oral hypoglycemic drugs: Secondary | ICD-10-CM | POA: Diagnosis not present

## 2022-01-21 DIAGNOSIS — Z7951 Long term (current) use of inhaled steroids: Secondary | ICD-10-CM | POA: Insufficient documentation

## 2022-01-21 DIAGNOSIS — I509 Heart failure, unspecified: Secondary | ICD-10-CM | POA: Insufficient documentation

## 2022-01-21 DIAGNOSIS — Z7982 Long term (current) use of aspirin: Secondary | ICD-10-CM | POA: Insufficient documentation

## 2022-01-21 NOTE — Progress Notes (Signed)
Bridget Lamb is here today for follow up post radiation to the pelvic. ? ?They completed their radiation on: 07/24/20 ? ?Does the patient complain of any of the following: ? ?Pain: Patient denies pain.  ?Abdominal bloating: No ?Diarrhea/Constipation: No ?Nausea/Vomiting: No ?Vaginal Discharge: No ?Blood in Urine or Stool: No ?Urinary Issues (dysuria/incomplete emptying/ incontinence/ increased frequency/urgency): Patient reports frequency and urgency.  ?Does patient report using vaginal dilator 2-3 times a week and/or sexually active 2-3 weeks: Yes, patient using dilators.  ?Post radiation skin changes: Patient reports having itching to vaginal area at times.  ? ? ?Additional comments if applicable:  ?  ?Vitals:  ? 01/21/22 0958  ?BP: (!) 158/53  ?Pulse: (!) 58  ?Resp: 18  ?Temp: (!) 97.2 ?F (36.2 ?C)  ?TempSrc: Temporal  ?SpO2: 100%  ?Weight: 245 lb 6.4 oz (111.3 kg)  ?Height: '5\' 4"'$  (1.626 m)  ?  ?

## 2022-02-10 ENCOUNTER — Ambulatory Visit: Payer: BC Managed Care – PPO | Admitting: Student

## 2022-02-10 ENCOUNTER — Encounter: Payer: Self-pay | Admitting: Student

## 2022-02-10 VITALS — BP 131/82 | HR 60 | Temp 98.3°F | Resp 17 | Ht 64.0 in | Wt 246.6 lb

## 2022-02-10 DIAGNOSIS — I5032 Chronic diastolic (congestive) heart failure: Secondary | ICD-10-CM | POA: Diagnosis not present

## 2022-02-10 DIAGNOSIS — I6523 Occlusion and stenosis of bilateral carotid arteries: Secondary | ICD-10-CM | POA: Diagnosis not present

## 2022-02-10 DIAGNOSIS — I7300 Raynaud's syndrome without gangrene: Secondary | ICD-10-CM | POA: Diagnosis not present

## 2022-02-10 MED ORDER — AMLODIPINE BESYLATE 2.5 MG PO TABS: 2.5000 mg | ORAL_TABLET | Freq: Every day | ORAL | 3 refills | Status: DC

## 2022-02-10 NOTE — Progress Notes (Signed)
? ?Primary Physician/Referring:  [name], PA-C ? ?Patient ID: Bridget Lamb, female    DOB: 07/02/1956, 66 y.o.   MRN: [id number] ? ?Chief Complaint  ?Patient presents with  ? HFpEF  ? Results  ? carotid stenosis  ?  6 weeks  ? ?HPI:   ? ?Bridget Lamb  is a 66 y.o. female with morbid obesity BMI 44, hypertension, restrictive airway disease due to bronchial asthma, hyperlipidemia, HFpEF, OSA now compliant with CPAP.  Patient has had multiple hospitalizations with acute on chronic diastolic heart failure in 02/2020 and again 02/2021.   ? ?Patient presents for 6-week follow-up.  At last office visit patient was concerned regarding bluish discoloration of her fingers.  Ordered laboratory testing as well as upper extremity arterial duplex.  Labs have unfortunately not been done.  Upper extremity arterial duplex was normal.  Patient is feeling well overall without specific complaints today.  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 ? ?Past Medical History:  ?Diagnosis Date  ? Allergic rhinitis   ? Anemia   ? hematology, Dr. Julien Nordmann, last 01/2011  ? Asthma   ? Cervix cancer ([place])   ? CHF (congestive heart failure) ([place]) 2004  ? Chronic kidney disease   ? protein in urine  ? Complication of anesthesia   ? Dyslipidemia   ? Dyspnea   ? on excertion  ? Endometrial cancer ([place])   ? H/O echocardiogram 01/07/04  ? mild LVH, nl LV systolic function, EF 15%, left atrial enlargement, mildly thickened aortic and mitral valves  ? History of radiation therapy 8/4-21-07/24/20  ? Pelvis IMRT and HDR, Dr. Gery Pray  ? History of thyroid cancer   ? Hypertension   ? Incarcerated ventral hernia 10/07/2013  ? Obesity, Class III, BMI 40-49.9 (morbid obesity) ([place]) 10/09/2013  ? Osteopenia   ? PONV (postoperative nausea and vomiting)   ? Sleep apnea   ? Umbilical hernia   ? Unspecified essential  hypertension 10/09/2013  ? Unspecified hypothyroidism 10/09/2013  ? ?Past Surgical History:  ?Procedure Laterality Date  ? BONE MARROW BIOPSY    ? CARDIAC CATHETERIZATION  2005  ? CHOLECYSTECTOMY  2003  ? COLONOSCOPY  2008  ? ROBOTIC ASSISTED TOTAL HYSTERECTOMY WITH BILATERAL SALPINGO OOPHERECTOMY N/A 10/25/2019  ? Procedure: XI ROBOTIC ASSISTED TOTAL HYSTERECTOMY WITH BILATERAL SALPINGO OOPHORECTOMY;  Surgeon: Lafonda Mosses, MD;  Location: [facility];  Service: Gynecology;  Laterality: N/A;  ? SENTINEL NODE BIOPSY N/A 10/25/2019  ? Procedure: SENTINEL LYMPH NODE BIOPSY;  Surgeon: Lafonda Mosses, MD;  Location: [facility];  Service: Gynecology;  Laterality: N/A;  ? THYROID SURGERY    ? partial thyroidectomy  ? TOTAL THYROIDECTOMY  2009  ? VENTRAL HERNIA REPAIR N/A 10/07/2013  ? Procedure: HERNIA REPAIR VENTRAL ADULT;  Surgeon: Gwenyth Ober, MD;  Location: [facility];  Service: General;  Laterality: N/A;  ? ?Family History  ?Problem Relation Age of Onset  ? Hypertension Mother   ? Pancreatic cancer Mother   ? Breast cancer Maternal Aunt   ? Lung cancer Father   ? Ovarian cancer Neg Hx   ? Endometrial cancer Neg Hx   ? Colon cancer Neg Hx   ?  ?Social History  ? ?Tobacco Use  ? Smoking status: Never  ? Smokeless tobacco: Never  ?Substance Use Topics  ? Alcohol use: No  ? ?Marital Status: Divorced  ?ROS  ?  Review of Systems  ?Constitutional: Negative for malaise/fatigue and weight gain.  ?Cardiovascular:  Positive for dyspnea on exertion (chronic, stable) and leg swelling (minimal, wears compression stockings - stable). Negative for chest pain, claudication, near-syncope, palpitations, paroxysmal nocturnal dyspnea and syncope.  ?Respiratory:  Positive for snoring (now compliant with CPAP). Negative for shortness of breath (improved).   ?Neurological:  Negative for dizziness.  ? ?Objective  ?Blood pressure 131/82, pulse 60, temperature 98.3 ?F (36.8 ?C), temperature source Temporal, resp. rate 17, height $RemoveBe'5\' 4"'dLFgmmVte$  (1.626 m),  weight 246 lb 9.6 oz (111.9 kg), SpO2 96 %.  ? ?  02/10/2022  ?  9:04 AM 01/21/2022  ?  9:58 AM 12/28/2021  ?  3:15 PM  ?Vitals with BMI  ?Height $Remove'5\' 4"'PlCutuO$  $RemoveB'5\' 4"'CNwlgaxD$  $RemoveBe'5\' 4"'WOeAPmPNY$   ?Weight 246 lbs 10 oz 245 lbs 6 oz 251 lbs 10 oz  ?BMI 42.31 42.1 43.17  ?Systolic 944 967 591  ?Diastolic 82 53 55  ?Pulse 60 58 67  ?  ? Physical Exam ?Vitals reviewed.  ?Constitutional:   ?   General: She is not in acute distress. ?   Appearance: She is well-developed.  ?   Comments: Morbidly obese  ?Cardiovascular:  ?   Rate and Rhythm: Normal rate and regular rhythm.  ?   Pulses: Intact distal pulses.     ?     Carotid pulses are  on the right side with bruit and  on the left side with bruit. ?     Radial pulses are 2+ on the right side and 2+ on the left side.  ?   Heart sounds: S1 normal and S2 normal. Murmur heard.  ?Early systolic murmur is present with a grade of 1/6 at the apex.  ?Harsh mid to late systolic murmur is also present at the upper right sternal border.  ?  No gallop.  ?   Comments: No JVD  ?Pulmonary:  ?   Effort: Pulmonary effort is normal. No accessory muscle usage or respiratory distress.  ?   Breath sounds: Normal breath sounds. No wheezing, rhonchi or rales.  ?Abdominal:  ?   Comments: Pannus present  ?Musculoskeletal:     ?   General: No tenderness.  ?   Right lower leg: Edema (trace) present.  ?   Left lower leg: Edema (trace) present.  ?Skin: ?   General: Skin is warm and dry.  ?   Capillary Refill: Capillary refill takes less than 2 seconds.  ?   Comments: Mild bluish discoloration or fingertips bilaterally  ?Neurological:  ?   Mental Status: She is alert.  ?Physical exam unchanged compared to previous office visit ? ?Laboratory examination:  ? ?Recent Labs  ?  03/02/21 ?0829 03/03/21 ?6384 03/04/21 ?6659  ?NA 141 142 140  ?K 3.6 3.9 3.7  ?CL 103 101 96*  ?CO2 32 34* 36*  ?GLUCOSE 129* 94 94  ?BUN 36* 30* 23  ?CREATININE 1.33* 1.28* 1.26*  ?CALCIUM 8.7* 9.0 9.2  ?GFRNONAA 44* 46* 47*  ? ?CrCl cannot be calculated (Patient's  most recent lab result is older than the maximum 21 days allowed.).  ? ?  Latest Ref Rng & Units 03/04/2021  ?  3:34 AM 03/03/2021  ?  4:47 AM 03/02/2021  ?  8:29 AM  ?CMP  ?Glucose 70 - 99 mg/dL 94   94   129    ?BUN 8 - 23 mg/dL 23   30   36    ?Creatinine 0.44 -  1.00 mg/dL 1.26   1.28   1.33    ?Sodium 135 - 145 mmol/L 140   142   141    ?Potassium 3.5 - 5.1 mmol/L 3.7   3.9   3.6    ?Chloride 98 - 111 mmol/L 96   101   103    ?CO2 22 - 32 mmol/L 36   34   32    ?Calcium 8.9 - 10.3 mg/dL 9.2   9.0   8.7    ?Total Protein 6.5 - 8.1 g/dL   5.9    ?Total Bilirubin 0.3 - 1.2 mg/dL   0.4    ?Alkaline Phos 38 - 126 U/L   62    ?AST 15 - 41 U/L   18    ?ALT 0 - 44 U/L   17    ? ? ?  Latest Ref Rng & Units 03/04/2021  ?  3:34 AM 03/03/2021  ?  4:47 AM 03/02/2021  ?  8:29 AM  ?CBC  ?WBC 4.0 - 10.5 K/uL 6.3   5.5   5.4    ?Hemoglobin 12.0 - 15.0 g/dL 9.3   9.1   9.3    ?Hematocrit 36.0 - 46.0 % 29.2   28.9   29.5    ?Platelets 150 - 400 K/uL 162   139   164    ? ?TSH ?No results for input(s): TSH in the last 8760 hours. ?BNP (last 3 results) ?Recent Labs  ?  02/28/21 ?0239 03/03/21 ?7639 03/04/21 ?4320  ?BNP 873.8* 269.7* 272.5*  ? ?Magnesium:  ?Recent Labs  ?  03/02/21 ?0829 03/03/21 ?0379 03/04/21 ?4446  ?MG 2.2 2.3 2.3  ?  ?External labs:  ?03/16/2021: ?NT proBNP 1054 ?Glucose 96, BUN 29, creatinine 1.53, GFR 30, sodium 142, potassium 4.9, AST 16, ALT 13, alk phos 99 ? ?02/28/2021: ?HDL 49, LDL 57, total cholesterol 117, triglycerides 54 ?A1c 5.4% ?BUN 23, creatinine 1.26,  ? ?12/15/2020: ?NT proBNP 820 ?Glucose 96, BUN 37, creatinine 1.52, GFR 36, sodium 146, potassium 4.4 ? ?12/09/2020: ?proBNP 1805 ?Total cholesterol 120, triglycerides 82, HDL 48, LDL 56, non-HDL 72 ?Glucose 100, BUN 25, creatinine 1.5, GFR 36, sodium 144, potassium 4.6 ? ?08/28/2020: ?Creatinine 1.27, EGFR 44, BUN 19, sodium 146, potassium 4.3 ?   ?Allergies  ? ?Allergies  ?Allergen Reactions  ? Procardia [Nifedipine] Other (See Comments)  ?  Ineffective   ?   ?Medications Prior to Visit:  ? ?Outpatient Medications Prior to Visit  ?Medication Sig Dispense Refill  ? albuterol (PROVENTIL HFA;VENTOLIN HFA) 108 (90 Base) MCG/ACT inhaler Inhale 2 puffs into the lungs every 4

## 2022-02-25 DIAGNOSIS — D649 Anemia, unspecified: Secondary | ICD-10-CM | POA: Diagnosis not present

## 2022-02-25 DIAGNOSIS — I129 Hypertensive chronic kidney disease with stage 1 through stage 4 chronic kidney disease, or unspecified chronic kidney disease: Secondary | ICD-10-CM | POA: Diagnosis not present

## 2022-02-25 DIAGNOSIS — N183 Chronic kidney disease, stage 3 unspecified: Secondary | ICD-10-CM | POA: Diagnosis not present

## 2022-02-25 DIAGNOSIS — R809 Proteinuria, unspecified: Secondary | ICD-10-CM | POA: Diagnosis not present

## 2022-04-05 ENCOUNTER — Other Ambulatory Visit: Payer: Self-pay | Admitting: Physician Assistant

## 2022-04-05 DIAGNOSIS — Z1231 Encounter for screening mammogram for malignant neoplasm of breast: Secondary | ICD-10-CM

## 2022-04-08 ENCOUNTER — Encounter: Payer: Self-pay | Admitting: Gastroenterology

## 2022-04-08 DIAGNOSIS — I10 Essential (primary) hypertension: Secondary | ICD-10-CM | POA: Diagnosis not present

## 2022-04-08 DIAGNOSIS — E039 Hypothyroidism, unspecified: Secondary | ICD-10-CM | POA: Diagnosis not present

## 2022-04-08 DIAGNOSIS — I7300 Raynaud's syndrome without gangrene: Secondary | ICD-10-CM | POA: Diagnosis not present

## 2022-04-08 DIAGNOSIS — E785 Hyperlipidemia, unspecified: Secondary | ICD-10-CM | POA: Diagnosis not present

## 2022-04-11 IMAGING — CT CT ABD-PELV W/O CM
2 of 4 series · 17 of 46 positions shown, 19 images · non-contrast
Comparison: October 16, 2019

CLINICAL DATA: High potassium and creatinine history of uterine
cancer

EXAM:
CT ABDOMEN AND PELVIS WITHOUT CONTRAST
TECHNIQUE: Multidetector CT imaging of the abdomen and pelvis was performed
following the standard protocol without IV contrast.

[Series 2: axial st · axial · 0.85mm/px · z∈[-708,-333]mm · 14 of 85 slices shown, 16 images]
[im 5/85  soft-tissue]
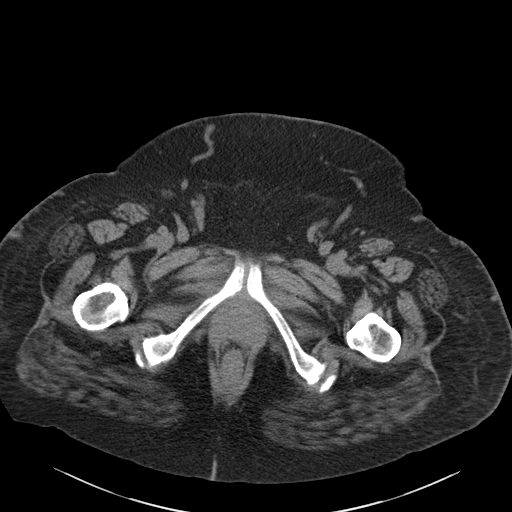
[im 5/85  bone]
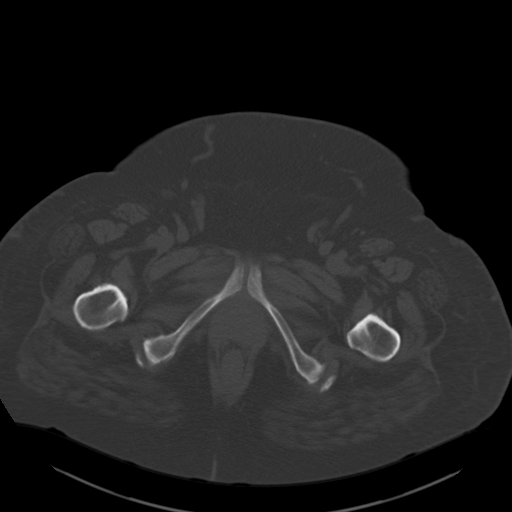
[im 9/85  soft-tissue]
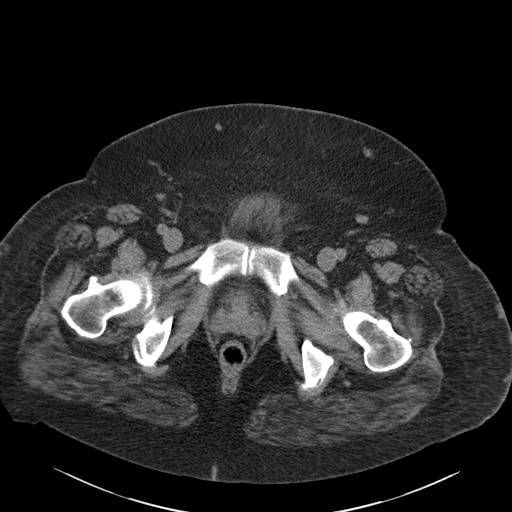
[im 18/85  soft-tissue]
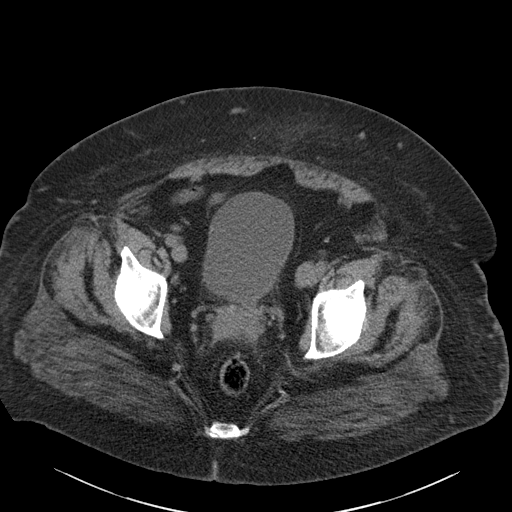
[im 23/85  soft-tissue]
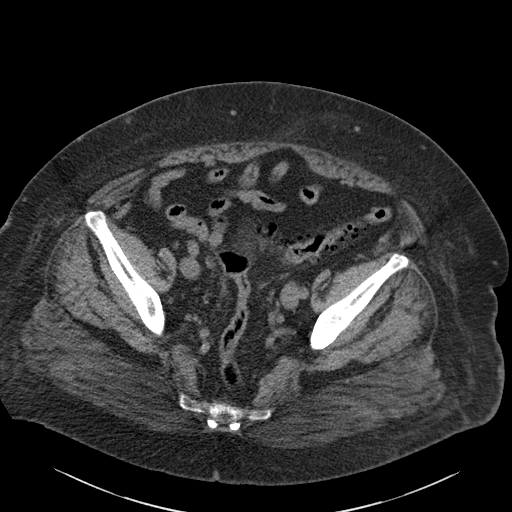
[im 27/85  soft-tissue]
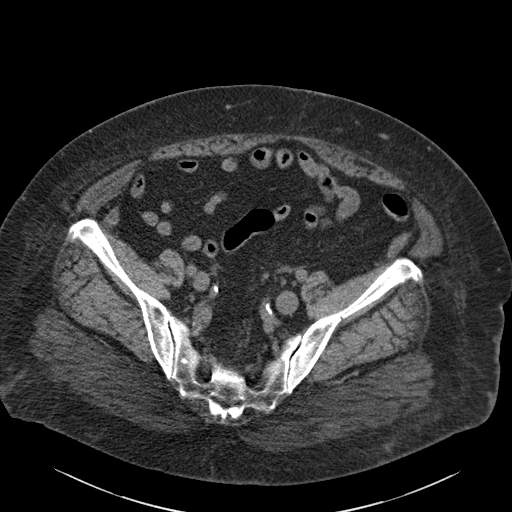
[im 36/85  soft-tissue]
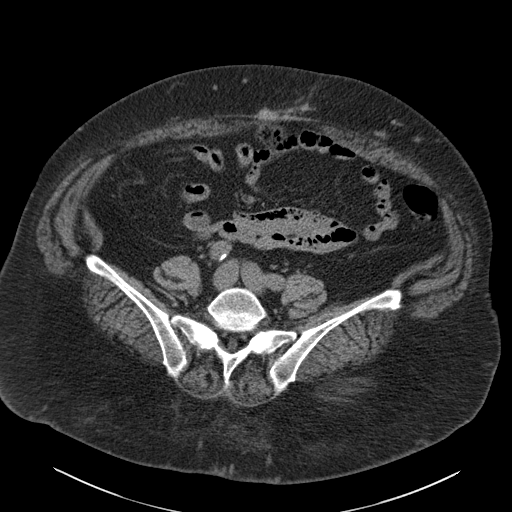
[im 40/85  soft-tissue]
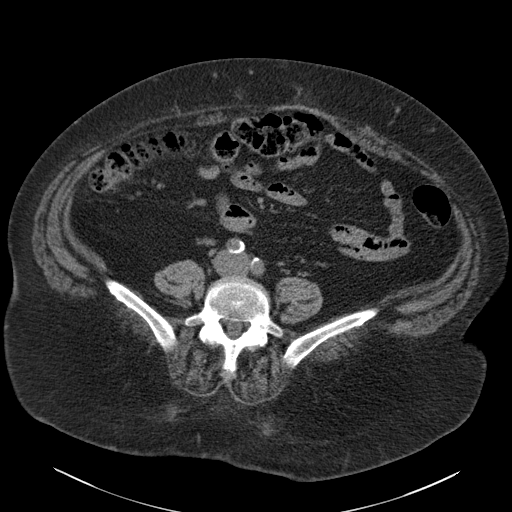
[im 45/85  soft-tissue]
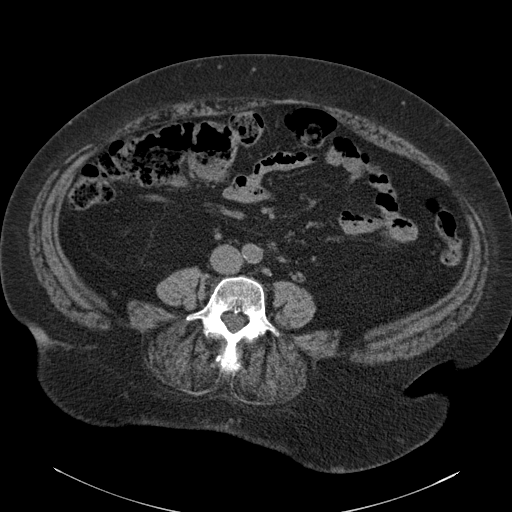
[im 49/85  soft-tissue]
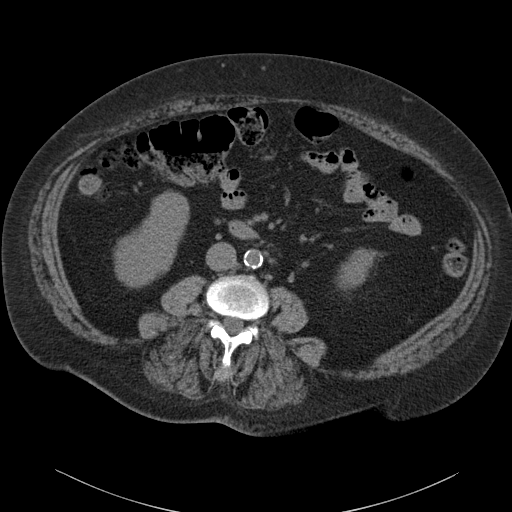
[im 49/85  bone]
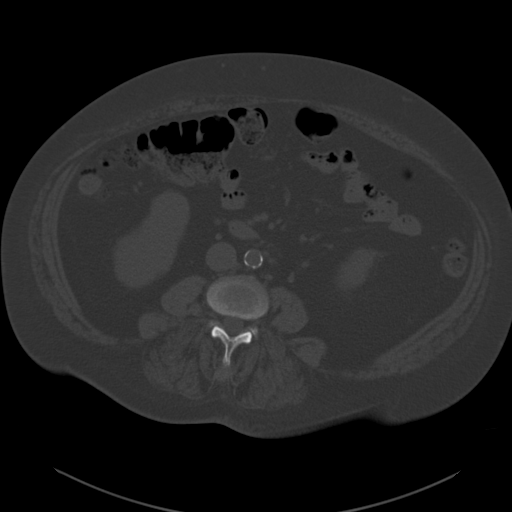
[im 58/85  soft-tissue]
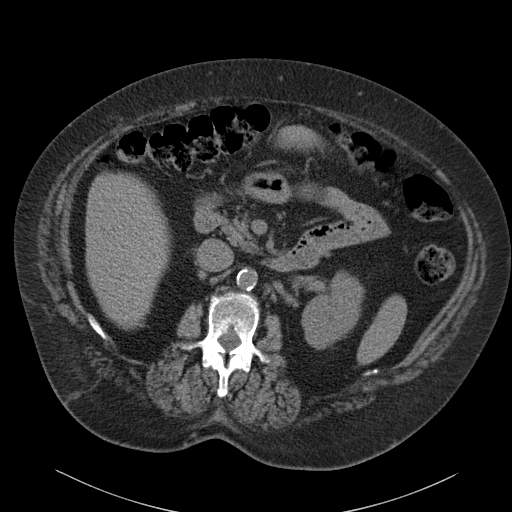
[im 62/85  soft-tissue]
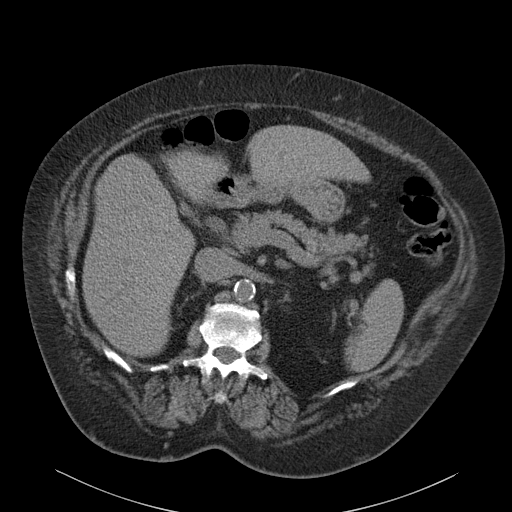
[im 67/85  soft-tissue]
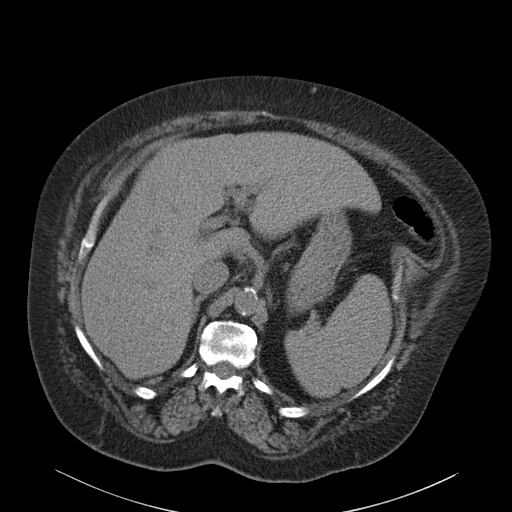
[im 76/85  soft-tissue]
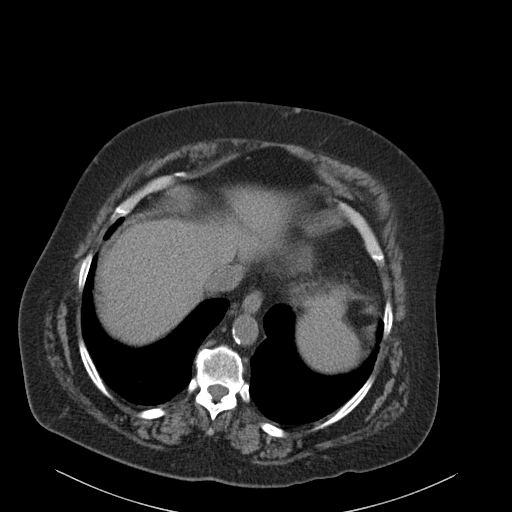
[im 80/85  soft-tissue]
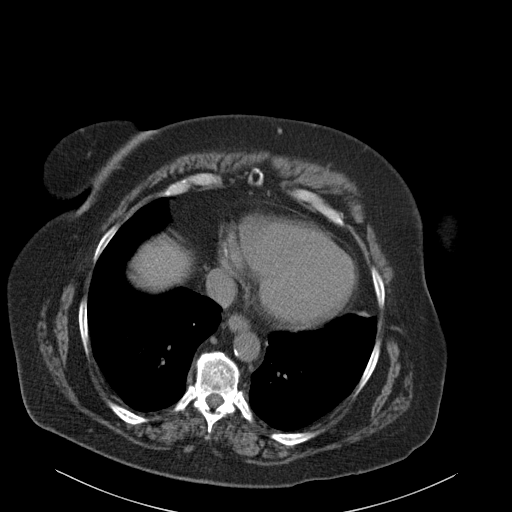

[Series 5: coronal st · coronal · 0.86mm/px · 3 of 169 slices shown]
[im 57/169  soft-tissue]
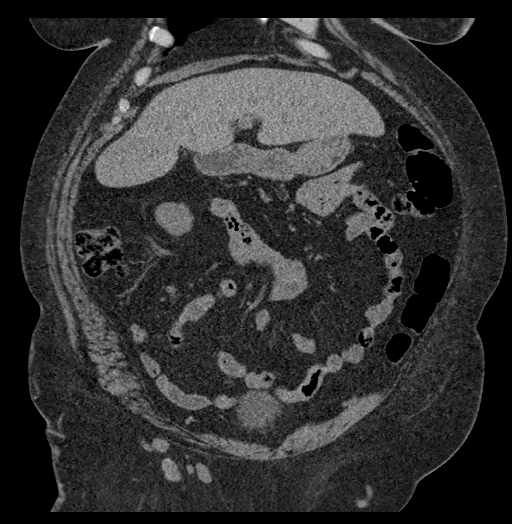
[im 75/169  soft-tissue]
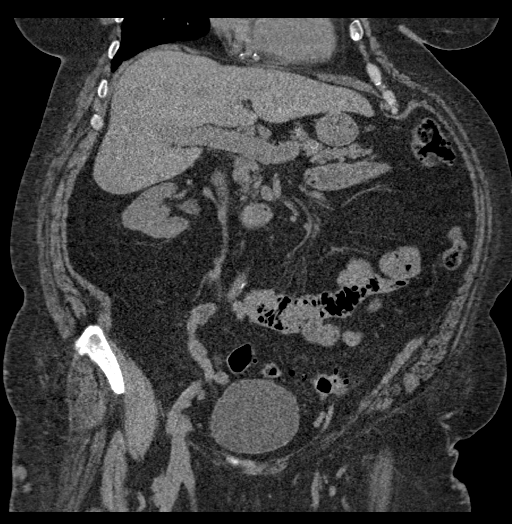
[im 94/169  soft-tissue]
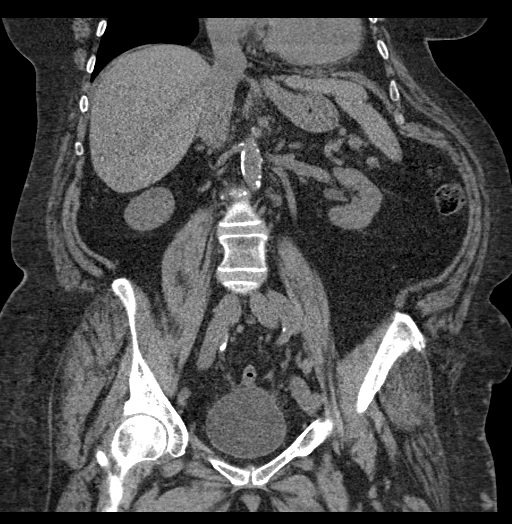

[17 of 46 positions shown; findings below may reference images not displayed]

FINDINGS: Lower chest: The visualized heart size within normal limits. No
pericardial fluid/thickening.

No hiatal hernia.

The visualized portions of the lungs are clear.

Hepatobiliary: Although limited due to the lack of intravenous
contrast, normal in appearance without gross focal abnormality. The
patient is status post cholecystectomy. No biliary ductal dilation.

Pancreas:  Unremarkable.  No surrounding inflammatory changes.

Spleen: Normal in size. Although limited due to the lack of
intravenous contrast, normal in appearance.

Adrenals/Urinary Tract: Both adrenal glands appear normal. The
kidneys and collecting system appear normal without evidence of
urinary tract calculus or hydronephrosis. Bladder is unremarkable.

Stomach/Bowel: The stomach, small bowel, and colon are normal in
appearance. No inflammatory changes or obstructive findings.
Scattered colonic diverticula are noted without diverticulitis.

Vascular/Lymphatic: There are no enlarged abdominal or pelvic lymph
nodes. Scattered aortic atherosclerotic calcifications are seen
without aneurysmal dilatation.

Reproductive: The patient is status post hysterectomy.

Other: No evidence of abdominal wall mass or hernia.

Musculoskeletal: No acute or significant osseous findings. Mild
degenerative changes in the lower lumbar spine.
IMPRESSION: No acute intra-abdominal or pelvic pathology to explain the
patient's symptoms.

Aortic Atherosclerosis ([id number]-RP8.8).

## 2022-04-15 ENCOUNTER — Other Ambulatory Visit: Payer: Self-pay | Admitting: Student

## 2022-04-15 DIAGNOSIS — I5032 Chronic diastolic (congestive) heart failure: Secondary | ICD-10-CM

## 2022-04-15 DIAGNOSIS — I10 Essential (primary) hypertension: Secondary | ICD-10-CM

## 2022-04-21 ENCOUNTER — Encounter: Payer: Self-pay | Admitting: Gynecologic Oncology

## 2022-04-23 ENCOUNTER — Other Ambulatory Visit: Payer: Self-pay

## 2022-04-23 ENCOUNTER — Inpatient Hospital Stay: Payer: BC Managed Care – PPO | Attending: Gynecologic Oncology | Admitting: Gynecologic Oncology

## 2022-04-23 VITALS — BP 142/53 | HR 51 | Temp 98.4°F | Resp 18 | Wt 248.2 lb

## 2022-04-23 DIAGNOSIS — Z8542 Personal history of malignant neoplasm of other parts of uterus: Secondary | ICD-10-CM

## 2022-04-23 DIAGNOSIS — Z923 Personal history of irradiation: Secondary | ICD-10-CM | POA: Diagnosis not present

## 2022-04-23 DIAGNOSIS — Z90710 Acquired absence of both cervix and uterus: Secondary | ICD-10-CM | POA: Insufficient documentation

## 2022-04-23 DIAGNOSIS — Z90722 Acquired absence of ovaries, bilateral: Secondary | ICD-10-CM | POA: Diagnosis not present

## 2022-04-23 DIAGNOSIS — C541 Malignant neoplasm of endometrium: Secondary | ICD-10-CM

## 2022-04-23 NOTE — Patient Instructions (Signed)
It was good to see you today.  I do not see or feel any evidence of cancer recurrence on your exam.  I would like to see you back in 6 months.  My schedule is only out through September.  Please call back sometime in October or November to schedule a visit to see me at the end of December.  As always, if you develop any new and concerning symptoms before your next visit, please call to see me sooner.

## 2022-04-23 NOTE — Progress Notes (Signed)
Gynecologic Oncology Return Clinic Visit  04/23/2022  Reason for Visit: surveillance in the setting of a history of endometrial cancer  Treatment History: Oncology History Overview Note  CA-125  10/12/19: 48.9  MSI stable   Endometrial cancer ([place])  09/19/2019 Initial Diagnosis   Endometrial cancer ([place])   09/19/2019 Initial Biopsy   EMB: EMCA with clear cell features   10/17/2019 Imaging   CT A/P: Mild endometrial thickening in this patient with known endometrial cancer. No evidence of metastatic disease in the abdomen/pelvis.  CXR 12/28: No evidence of acute cardiopulmonary abnormality.   10/24/2019 Cancer Staging   Staging form: Corpus Uteri - Carcinoma and Carcinosarcoma, AJCC 8th Edition - Clinical stage from 10/24/2019: FIGO Stage IA (cT1a, cN0(sn), cM0) - Signed by Lafonda Mosses, MD on 05/01/2020   10/25/2019 Surgery   TRH/BSO, bil SLN, repair of sulcal tear Findings: On EUA, small cervix, moderately mobile uterus, no adnexal masses.  On intra-abdominal entry, adhesions from the omentum to the anterior wall at the patient's prior hernia repair as well as to the lateral anterior wall bilaterally.  Right aspect of the liver and diaphragm of secured by adhesions.  Left liver edge, diaphragm and stomach normal in appearance.  Uterus 10 cm in bulbous.  Posterior cul-de-sac completely obliterated and bilateral fallopian tubes and adnexa adherent to posterior uterus and sigmoid mesentery.  Fallopian tubes and left ovary somewhat concerning for involvement by tumor versus prior history of pelvic infection or endometriosis.  Mapping successful to bilateral external iliac sentinel lymph nodes.  Small and large bowel normal appearing.  No intra-abdominal evidence of disease.  Significant right sulcal tear noted after delivery of specimen through the vagina, repaired robotically as well as vaginally.   10/25/2019 Cancer Staging   Endometrial cancer, Stage IA, grade 1  endometrioid MI <50%, no LVSI, negative SLNs   05/01/2020 Relapse/Recurrence   A. VAGINAL CUFF, BIOPSY:  - Adenocarcinoma, see comment.   COMMENT:   The adenocarcinoma is similar in appearance to the patient's prior  endometrioid adenocarcinoma ([id number], reviewed).    05/05/2020 Imaging   CT C/A/P: No acute intra-abdominal or pelvic pathology.   05/28/2020 - 07/24/2020 Radiation Therapy    Radiation Treatment Dates: 05/28/2020 through 07/24/2020 Site Technique Total Dose (Gy) Dose per Fx (Gy) Completed Fx Beam Energies  Pelvis: Pelvis IMRT 45/45 1.8 25/25 6X  Vagina: Pelvis_Bst HDR-brachy 24/24 6 4/4 Ir-192        Interval History: Reports overall doing well.  Denies any vaginal bleeding or discharge.  Reports normal bowel and bladder function.  Denies any abdominal or pelvic pain.  Has been having some issues with her ankles related to pain and difficulty walking.  There was some thought that she might have gout.  Past Medical/Surgical History: Past Medical History:  Diagnosis Date   Allergic rhinitis    Anemia    hematology, Dr. Julien Nordmann, last 01/2011   Asthma    Cervix cancer ([place])    CHF (congestive heart failure) ([place]) 2004   Chronic kidney disease    protein in urine   Complication of anesthesia    Dyslipidemia    Dyspnea    on excertion   Endometrial cancer ([place])    H/O echocardiogram 01/07/04   mild LVH, nl LV systolic function, EF 23%, left atrial enlargement, mildly thickened aortic and mitral valves   History of radiation therapy 8/4-21-07/24/20   Pelvis IMRT and HDR, Dr. Gery Pray   History of thyroid cancer    Hypertension  Incarcerated ventral hernia 10/07/2013   Obesity, Class III, BMI 40-49.9 (morbid obesity) ([facility]) 10/09/2013   Osteopenia    PONV (postoperative nausea and vomiting)    Sleep apnea    Umbilical hernia    Unspecified essential hypertension 10/09/2013   Unspecified hypothyroidism 10/09/2013    Past Surgical History:  Procedure  Laterality Date   BONE MARROW BIOPSY     CARDIAC CATHETERIZATION  2005   CHOLECYSTECTOMY  2003   COLONOSCOPY  2008   ROBOTIC ASSISTED TOTAL HYSTERECTOMY WITH BILATERAL SALPINGO OOPHERECTOMY N/A 10/25/2019   Procedure: XI ROBOTIC ASSISTED TOTAL HYSTERECTOMY WITH BILATERAL SALPINGO OOPHORECTOMY;  Surgeon: Carver Fila, MD;  Location: [facility];  Service: Gynecology;  Laterality: N/A;   SENTINEL NODE BIOPSY N/A 10/25/2019   Procedure: SENTINEL LYMPH NODE BIOPSY;  Surgeon: Carver Fila, MD;  Location[facility];  Service: Gynecology;  Laterality: N/A;   THYROID SURGERY     partial thyroidectomy   TOTAL THYROIDECTOMY  2009   VENTRAL HERNIA REPAIR N/A 10/07/2013   Procedure: HERNIA REPAIR VENTRAL ADULT;  Surgeon: Cherylynn Ridges, MD;  Location: [facility];  Service: General;  Laterality: N/A;    Family History  Problem Relation Age of Onset   Hypertension Mother    Pancreatic cancer Mother    Breast cancer Maternal Aunt    Lung cancer Father    Ovarian cancer Neg Hx    Endometrial cancer Neg Hx    Colon cancer Neg Hx     Social History   Socioeconomic History   Marital status: Divorced    Spouse name: Not on file   Number of children: 1   Years of education: Not on file   Highest education level: Not on file  Occupational History   Not on file  Tobacco Use   Smoking status: Never   Smokeless tobacco: Never  Vaping Use   Vaping Use: Never used  Substance and Sexual Activity   Alcohol use: No   Drug use: No   Sexual activity: Not Currently  Other Topics Concern   Not on file  Social History Narrative   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[employer], is on her feet for 8-hour shifts   Social Determinants of Health   Financial Resource Strain: Medium Risk (03/03/2021)   Overall Financial Resource Strain (CARDIA)    Difficulty of Paying Living Expenses: Somewhat hard  Food Insecurity: Food Insecurity Present (03/03/2021)   Hunger Vital Sign    Worried About Running Out of  Food in the Last Year: Never true    Ran Out of Food in the Last Year: Sometimes true  Transportation Needs: No Transportation Needs (03/03/2021)   PRAPARE - Administrator, Civil Service (Medical): No    Lack of Transportation (Non-Medical): No  Physical Activity: Not on file  Stress: Not on file  Social Connections: Not on file    Current Medications:  Current Outpatient Medications:    albuterol (PROVENTIL HFA;VENTOLIN HFA) 108 (90 Base) MCG/ACT inhaler, Inhale 2 puffs into the lungs every 4 (four) hours as needed for wheezing or shortness of breath., Disp: 1 Inhaler, Rfl: 0   amLODipine (NORVASC) 2.5 MG tablet, Take 1 tablet (2.5 mg total) by mouth daily., Disp: 30 tablet, Rfl: 3   aspirin EC 81 MG tablet, Take 81 mg by mouth daily., Disp: , Rfl:    Calcium Carbonate-Vitamin D (CALCIUM 600+D PO), Take 1 tablet by mouth 2 (two) times daily., Disp: , Rfl:  dapagliflozin propanediol (FARXIGA) 10 MG TABS tablet, Take 1 tablet (10 mg total) by mouth daily., Disp: 30 tablet, Rfl: 2   FeFum-FePoly-FA-B Cmp-C-Biot (INTEGRA PLUS) CAPS, Take 1 capsule by mouth daily., Disp: , Rfl:    fluticasone-salmeterol (ADVAIR) 250-50 MCG/ACT AEPB, Inhale 1 puff into the lungs in the morning and at bedtime., Disp: , Rfl:    levothyroxine (SYNTHROID) 125 MCG tablet, Take 150 mcg by mouth., Disp: , Rfl:    loratadine (CLARITIN) 10 MG tablet, Take 10 mg by mouth daily., Disp: , Rfl:    Nebivolol HCl 20 MG TABS, Take 1 tablet by mouth once daily, Disp: 90 tablet, Rfl: 0   olopatadine (PATANOL) 0.1 % ophthalmic solution, Place 1 drop into both eyes 2 (two) times daily. , Disp: , Rfl:    rosuvastatin (CRESTOR) 10 MG tablet, Take 10 mg by mouth at bedtime., Disp: , Rfl:    senna-docusate (SENOKOT-S) 8.6-50 MG tablet, Take 1 tablet by mouth 2 (two) times daily as needed for moderate constipation., Disp:  , Rfl:    sodium chloride (OCEAN) 0.65 % SOLN nasal spray, Place 1 spray into both nostrils as needed  for congestion., Disp: , Rfl:    torsemide (DEMADEX) 20 MG tablet, Take 1 tablet by mouth once daily, Disp: 90 tablet, Rfl: 0  Review of Systems: + Joint pain, back pain, numbness. Denies appetite changes, fevers, chills, fatigue, unexplained weight changes. Denies hearing loss, neck lumps or masses, mouth sores, ringing in ears or voice changes. Denies cough or wheezing.  Denies shortness of breath. Denies chest pain or palpitations. Denies leg swelling. Denies abdominal distention, pain, blood in stools, constipation, diarrhea, nausea, vomiting, or early satiety. Denies pain with intercourse, dysuria, frequency, hematuria or incontinence. Denies hot flashes, pelvic pain, vaginal bleeding or vaginal discharge.   Denies itching, rash, or wounds. Denies dizziness, headaches, or seizures. Denies swollen lymph nodes or glands, denies easy bruising or bleeding. Denies anxiety, depression, confusion, or decreased concentration.  Physical Exam: BP (!) 142/53 (BP Location: Left Arm, Patient Position: Sitting, Cuff Size: Large)   Pulse (!) 51   Temp 98.4 F (36.9 C) (Tympanic)   Resp 18   Wt 248 lb 3 oz (112.6 kg)   SpO2 100%   BMI 42.60 kg/m  General: Alert, oriented, no acute distress. HEENT: Normocephalic, atraumatic, sclera anicteric. Chest: Unlabored breathing on room air. Cardiovascular: Regular rate and rhythm. Abdomen: Obese, soft, nontender.  Normoactive bowel sounds.  No masses or hepatosplenomegaly appreciated.  Well-healed incisions. Extremities: Grossly normal range of motion.  Warm, well perfused.  Trace edema bilaterally. Skin: No rashes or lesions noted. Lymphatics: No cervical, supraclavicular, or inguinal adenopathy. GU: Normal appearing external genitalia without erythema, excoriation, or lesions.  Speculum exam reveals continued small area of mild erythema along the anterior vaginal mucosa, previously biopsied.  Radiation changes noted.  Bimanual exam reveals cuff is  smooth, no nodularity or masses, some narrowing of the upper vagina noted.  Rectovaginal exam confirms these findings.  Laboratory & Radiologic Studies: Vaginal biopsy 10/21/21:  Benign squamous mucosa and inflamed granulation tissue.  No malignancy.  Assessment & Plan: Bridget Lamb is a 65 y.o. woman with early stage uterine cancer who presented in July 2021 with vaginal bleeding found to have a recurrence at the vaginal cuff treated with RT completed in 06/2020.   Overall doing well today.  NED on exam.   I encouraged her to continue using vaginal dilator as she has been given mild agglutination at the  vaginal apex.   We will continue with visits every 3 months until she is 2 years out from her recurrence.  She is seeing Dr. Sondra Come in 3 months and will come back to see me in 6 months.  At her visit with me in December, if she remains NED, we will transition to visits every 6 months.  We reviewed signs and symptoms that would be concerning for cancer recurrence and I stressed the importance of calling if she develops any of these before her next visit.   22 minutes of total time was spent for this patient encounter, including preparation, face-to-face counseling with the patient and coordination of care, and documentation of the encounter.  Jeral Pinch, MD  Division of Gynecologic Oncology  Department of Obstetrics and Gynecology  [facility]

## 2022-05-12 ENCOUNTER — Ambulatory Visit
Admission: RE | Admit: 2022-05-12 | Discharge: 2022-05-12 | Disposition: A | Discharge status: Home / Self Care | Payer: BC Managed Care – PPO | Source: Ambulatory Visit | Attending: Physician Assistant | Admitting: Physician Assistant

## 2022-05-12 ENCOUNTER — Ambulatory Visit: Payer: BC Managed Care – PPO

## 2022-05-12 DIAGNOSIS — Z1231 Encounter for screening mammogram for malignant neoplasm of breast: Secondary | ICD-10-CM | POA: Diagnosis not present

## 2022-05-13 ENCOUNTER — Encounter: Payer: Self-pay | Admitting: Gastroenterology

## 2022-05-13 ENCOUNTER — Telehealth: Payer: Self-pay

## 2022-05-13 ENCOUNTER — Ambulatory Visit (INDEPENDENT_AMBULATORY_CARE_PROVIDER_SITE_OTHER): Payer: BC Managed Care – PPO | Admitting: Gastroenterology

## 2022-05-13 VITALS — BP 128/82 | HR 55 | Ht 64.0 in | Wt 249.0 lb

## 2022-05-13 DIAGNOSIS — Z86010 Personal history of colonic polyps: Secondary | ICD-10-CM

## 2022-05-13 MED ORDER — ONDANSETRON 4 MG PO TBDP: 4.0000 mg | ORAL_TABLET | ORAL | 0 refills | Status: DC | PRN

## 2022-05-13 NOTE — Patient Instructions (Addendum)
If you are age 67 or older, your body mass index should be between 23-30. Your Body mass index is 42.74 kg/m. If this is out of the aforementioned range listed, please consider follow up with your Primary Care Provider.  If you are age 66 or younger, your body mass index should be between 19-25. Your Body mass index is 42.74 kg/m. If this is out of the aformentioned range listed, please consider follow up with your Primary Care Provider.   ________________________________________________________  The [facility] providers would like to encourage you to use [facility] to communicate with providers for non-urgent requests or questions.  Due to long hold times on the telephone, sending your provider a message by [facility] may be a faster and more efficient way to get a response.  Please allow 48 business hours for a response.  Please remember that this is for non-urgent requests.  _______________________________________________________  We have sent the following medications to your pharmacy for you to pick up at your convenience: Zofran to take with prep if needed  You have been scheduled for a colonoscopy. Please follow written instructions given to you at your visit today.  Please pick up your prep supplies at the pharmacy within the next 1-3 days. If you use inhalers (even only as needed), please bring them with you on the day of your procedure.   You will be contacted by our office prior to your procedure regarding cardiac clearance. If you do not hear from our office 2 week prior to your scheduled procedure, please call [phone] to discuss.   Hold Iron 7 days prior to procedure  Thank you,  Dr. Jackquline Denmark

## 2022-05-13 NOTE — Telephone Encounter (Signed)
Dr Nadyne Coombes,  This patient has an colonoscopy coming up on 06-17-2022 with Dr Lyndel Safe and we are asking from your standpoint if she is cleared to have this procedure? Please reply as soon as you can. Thank you.   [facility] Pre-operative Risk Assessment     Request for surgical clearance:     Endoscopy Procedure  What type of surgery is being performed?     Colon  When is this surgery scheduled?     06-17-2022  What type of clearance is required ?   Cardiac Clearance  Are there any medications that need to be held prior to surgery and how long? Patient isn't on any prescribed blood thinners  Practice name and name of physician performing surgery?      [facility] Jackquline Denmark, MD  What is your office phone and fax number?      Phone- [phone]  [phone]  Anesthesia type (None, local, MAC, general) ?       MAC

## 2022-05-13 NOTE — Telephone Encounter (Signed)
Patient is low risk from cardiac standpoint to have colonoscopy/endoscopy.  No special instructions needed.

## 2022-05-13 NOTE — Progress Notes (Signed)
Chief Complaint: For colon  Referring Provider:  [name], PA-C      ASSESSMENT AND PLAN;   #1. H/O polyps  #2. Comorbid conditions include HTN, asthma, HLD, obesity, OSA, anemia of chronic disease.  Plan: -Colon with miralax after cardio clearence from Dr Nadyne Coombes   Discussed risks & benefits of colonoscopy. Risks including rare perforation req laparotomy, bleeding after bx/polypectomy req blood transfusion, rarely missing neoplasms, risks of anesthesia/sedation, rare risk of damage to internal organs. Benefits outweigh the risks. Patient agrees to proceed. All the questions were answered. Pt consents to proceed. HPI:    Bridget Lamb is a 66 y.o. female  With HTN, restrictive airway disease due to bronchial asthma, HLD, HFpEF (38 to 60% 02/2021, being followed by Dr. Einar Gip), Obesity, OSA on CPAP, CKD, H/O endomertial ca s/p TAH with BSO followed by vaginal cuff recurrence s/p XRT 06/2020, anemia of chronic disease (Hb 9-10 range).  For colon  No nausea, vomiting, heartburn, regurgitation, odynophagia or dysphagia.  No melena or hematochezia. No unintentional weight loss. No abdominal pain.  Alt diarrhea and constipation with abdominal bloating x yrs. - in part due to meds and IBS.   Previous GI work-up: Had colon x 4 with Dr Collene Mares Last 8 yrs ago Had polyps on 2nd colon Got letter to rpt.  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 Past Medical History:  Diagnosis Date   Allergic rhinitis    Anemia    hematology, Dr. Julien Nordmann, last 01/2011   Asthma    Cervix cancer ([place])    CHF (congestive heart failure) ([place]) 2004   Chronic kidney disease    protein in urine   Complication of anesthesia    Dyslipidemia    Dyspnea    on excertion   Endometrial cancer ([place])    H/O echocardiogram 01/07/04   mild LVH, nl LV systolic function, EF 91%, left atrial enlargement, mildly thickened aortic and mitral valves   History of radiation therapy  8/4-21-07/24/20   Pelvis IMRT and HDR, Dr. Gery Pray   History of thyroid cancer    Hypertension    Incarcerated ventral hernia 10/07/2013   Obesity, Class III, BMI 40-49.9 (morbid obesity) ([place]) 10/09/2013   Osteopenia    PONV (postoperative nausea and vomiting)    Sleep apnea    Umbilical hernia    Unspecified essential hypertension 10/09/2013   Unspecified hypothyroidism 10/09/2013    Past Surgical History:  Procedure Laterality Date   BONE MARROW BIOPSY     CARDIAC CATHETERIZATION  2005   CHOLECYSTECTOMY  2003   COLONOSCOPY  2008   ROBOTIC ASSISTED TOTAL HYSTERECTOMY WITH BILATERAL SALPINGO OOPHERECTOMY N/A 10/25/2019   Procedure: XI ROBOTIC ASSISTED TOTAL HYSTERECTOMY WITH BILATERAL SALPINGO OOPHORECTOMY;  Surgeon: Lafonda Mosses, MD;  Location: [facility];  Service: Gynecology;  Laterality: N/A;   SENTINEL NODE BIOPSY N/A 10/25/2019   Procedure: SENTINEL LYMPH NODE BIOPSY;  Surgeon: Lafonda Mosses, MD;  Location: [facility];  Service: Gynecology;  Laterality: N/A;   THYROID SURGERY     partial thyroidectomy   TOTAL THYROIDECTOMY  2009   VENTRAL HERNIA REPAIR N/A 10/07/2013   Procedure: HERNIA REPAIR VENTRAL ADULT;  Surgeon: Gwenyth Ober, MD;  Location: [place];  Service: General;  Laterality: N/A;    Family History  Problem Relation Age of Onset   Hypertension Mother    Pancreatic cancer Mother    Lung cancer Father    Breast cancer Maternal Aunt  Ovarian cancer Neg Hx    Endometrial cancer Neg Hx    Colon cancer Neg Hx    Rectal cancer Neg Hx    Stomach cancer Neg Hx     Social History   Tobacco Use   Smoking status: Never   Smokeless tobacco: Never  Vaping Use   Vaping Use: Never used  Substance Use Topics   Alcohol use: No   Drug use: No    Current Outpatient Medications  Medication Sig Dispense Refill   albuterol (PROVENTIL HFA;VENTOLIN HFA) 108 (90 Base) MCG/ACT inhaler Inhale 2 puffs into the lungs every 4 (four) hours as needed for wheezing  or shortness of breath. 1 Inhaler 0   amLODipine (NORVASC) 2.5 MG tablet Take 1 tablet (2.5 mg total) by mouth daily. 30 tablet 3   aspirin EC 81 MG tablet Take 81 mg by mouth daily.     Calcium Carbonate-Vitamin D (CALCIUM 600+D PO) Take 1 tablet by mouth 2 (two) times daily.     dapagliflozin propanediol (FARXIGA) 10 MG TABS tablet Take 1 tablet (10 mg total) by mouth daily. 30 tablet 2   FeFum-FePoly-FA-B Cmp-C-Biot (INTEGRA PLUS) CAPS Take 1 capsule by mouth daily.     fluticasone-salmeterol (ADVAIR) 250-50 MCG/ACT AEPB Inhale 1 puff into the lungs in the morning and at bedtime.     levothyroxine (SYNTHROID) 125 MCG tablet Take 150 mcg by mouth.     loratadine (CLARITIN) 10 MG tablet Take 10 mg by mouth daily.     Nebivolol HCl 20 MG TABS Take 1 tablet by mouth once daily 90 tablet 0   olopatadine (PATANOL) 0.1 % ophthalmic solution Place 1 drop into both eyes 2 (two) times daily.      rosuvastatin (CRESTOR) 10 MG tablet Take 10 mg by mouth at bedtime.     senna-docusate (SENOKOT-S) 8.6-50 MG tablet Take 1 tablet by mouth 2 (two) times daily as needed for moderate constipation.     sodium chloride (OCEAN) 0.65 % SOLN nasal spray Place 1 spray into both nostrils as needed for congestion.     torsemide (DEMADEX) 20 MG tablet Take 1 tablet by mouth once daily 90 tablet 0   No current facility-administered medications for this visit.    Allergies  Allergen Reactions   Procardia [Nifedipine] Other (See Comments)    Ineffective     Review of Systems:  Constitutional: Denies fever, chills, diaphoresis, appetite change and fatigue.  HEENT: Denies photophobia, eye pain, redness, hearing loss, ear pain, congestion, sore throat, rhinorrhea, sneezing, mouth sores, neck pain, neck stiffness and tinnitus.   Respiratory: Denies SOB, DOE, cough, chest tightness,  and wheezing.   Cardiovascular: Denies chest pain, palpitations and leg swelling.  Genitourinary: Denies dysuria, urgency, frequency,  hematuria, flank pain and difficulty urinating.  Musculoskeletal: Has myalgias, back pain, joint swelling, arthralgias and gait problem.  Skin: No rash.  Neurological: Denies dizziness, seizures, syncope, weakness, light-headedness, numbness and headaches.  Hematological: Denies adenopathy. Easy bruising, personal or family bleeding history  Psychiatric/Behavioral: No anxiety or depression     Physical Exam:    BP 128/82   Pulse (!) 55   Ht _0  (1.626 m)   Wt 249 lb (112.9 kg)   SpO2 99%   BMI 42.74 kg/m  Wt Readings from Last 3 Encounters:  05/13/22 249 lb (112.9 kg)  04/23/22 248 lb 3 oz (112.6 kg)  02/10/22 246 lb 9.6 oz (111.9 kg)   Constitutional:  Well-developed, in no acute distress. Psychiatric: Normal  mood and affect. Behavior is normal. HEENT: Pupils normal.  Conjunctivae are normal. No scleral icterus. Cardiovascular: Normal rate, regular rhythm. No edema Pulmonary/chest: Effort normal and breath sounds normal. No wheezing, rales or rhonchi. Abdominal: Soft, nondistended. Nontender. Bowel sounds active throughout. There are no masses palpable. No hepatomegaly. Rectal: Deferred Neurological: Alert and oriented to person place and time. Skin: Skin is warm and dry. No rashes noted.  Data Reviewed: I have personally reviewed following labs and imaging studies  CBC:    Latest Ref Rng & Units 03/04/2021    3:34 AM 03/03/2021    4:47 AM 03/02/2021    8:29 AM  CBC  WBC 4.0 - 10.5 K/uL 6.3  5.5  5.4   Hemoglobin 12.0 - 15.0 g/dL 9.3  9.1  9.3   Hematocrit 36.0 - 46.0 % 29.2  28.9  29.5   Platelets 150 - 400 K/uL 162  139  164     CMP:    Latest Ref Rng & Units 03/04/2021    3:34 AM 03/03/2021    4:47 AM 03/02/2021    8:29 AM  CMP  Glucose 70 - 99 mg/dL 94  94  129   BUN 8 - 23 mg/dL 23  30  36   Creatinine 0.44 - 1.00 mg/dL 1.26  1.28  1.33   Sodium 135 - 145 mmol/L 140  142  141   Potassium 3.5 - 5.1 mmol/L 3.7  3.9  3.6   Chloride 98 - 111 mmol/L 96  101  103    CO2 22 - 32 mmol/L 36  34  32   Calcium 8.9 - 10.3 mg/dL 9.2  9.0  8.7   Total Protein 6.5 - 8.1 g/dL   5.9   Total Bilirubin 0.3 - 1.2 mg/dL   0.4   Alkaline Phos 38 - 126 U/L   62   AST 15 - 41 U/L   18   ALT 0 - 44 U/L   17         Carmell Austria, MD 05/13/2022, 10:11 AM  Cc: [name], PA-C

## 2022-05-14 NOTE — Telephone Encounter (Signed)
Patient made aware.

## 2022-05-14 NOTE — Telephone Encounter (Signed)
Noted! Thank you

## 2022-06-03 DIAGNOSIS — R1084 Generalized abdominal pain: Secondary | ICD-10-CM | POA: Diagnosis not present

## 2022-06-03 DIAGNOSIS — I10 Essential (primary) hypertension: Secondary | ICD-10-CM | POA: Diagnosis not present

## 2022-06-10 ENCOUNTER — Encounter: Payer: Self-pay | Admitting: Gastroenterology

## 2022-06-17 ENCOUNTER — Ambulatory Visit (AMBULATORY_SURGERY_CENTER): Payer: BC Managed Care – PPO | Admitting: Gastroenterology

## 2022-06-17 ENCOUNTER — Encounter: Payer: Self-pay | Admitting: Gastroenterology

## 2022-06-17 VITALS — BP 106/43 | HR 98 | Temp 97.5°F | Resp 16 | Ht 64.0 in | Wt 249.0 lb

## 2022-06-17 DIAGNOSIS — D122 Benign neoplasm of ascending colon: Secondary | ICD-10-CM | POA: Diagnosis not present

## 2022-06-17 DIAGNOSIS — Z09 Encounter for follow-up examination after completed treatment for conditions other than malignant neoplasm: Secondary | ICD-10-CM | POA: Diagnosis not present

## 2022-06-17 DIAGNOSIS — Z86010 Personal history of colonic polyps: Secondary | ICD-10-CM

## 2022-06-17 DIAGNOSIS — D123 Benign neoplasm of transverse colon: Secondary | ICD-10-CM | POA: Diagnosis not present

## 2022-06-17 DIAGNOSIS — Z1211 Encounter for screening for malignant neoplasm of colon: Secondary | ICD-10-CM | POA: Diagnosis not present

## 2022-06-17 DIAGNOSIS — K635 Polyp of colon: Secondary | ICD-10-CM | POA: Diagnosis not present

## 2022-06-17 MED ORDER — SODIUM CHLORIDE 0.9 % IV SOLN: 500.0000 mL | Freq: Once | INTRAVENOUS | Status: DC

## 2022-06-17 NOTE — Patient Instructions (Signed)
Handout on diverticulosis, polyps, hemorrhoids, and high-fiber diet. Await pathology results. Repeat colonoscopy for surveillance will be determined based off of pathology results.   YOU HAD AN ENDOSCOPIC PROCEDURE TODAY AT [facility]:   Refer to the procedure report that was given to you for any specific questions about what was found during the examination.  If the procedure report does not answer your questions, please call your gastroenterologist to clarify.  If you requested that your care partner not be given the details of your procedure findings, then the procedure report has been included in a sealed envelope for you to review at your convenience later.  YOU SHOULD EXPECT: Some feelings of bloating in the abdomen. Passage of more gas than usual.  Walking can help get rid of the air that was put into your GI tract during the procedure and reduce the bloating. If you had a lower endoscopy (such as a colonoscopy or flexible sigmoidoscopy) you may notice spotting of blood in your stool or on the toilet paper. If you underwent a bowel prep for your procedure, you may not have a normal bowel movement for a few days.  Please Note:  You might notice some irritation and congestion in your nose or some drainage.  This is from the oxygen used during your procedure.  There is no need for concern and it should clear up in a day or so.  SYMPTOMS TO REPORT IMMEDIATELY:  Following lower endoscopy (colonoscopy or flexible sigmoidoscopy):  Excessive amounts of blood in the stool  Significant tenderness or worsening of abdominal pains  Swelling of the abdomen that is new, acute  Fever of 100F or higher  For urgent or emergent issues, a gastroenterologist can be reached at any hour by calling [phone]. Do not use MyChart messaging for urgent concerns.    DIET:  We do recommend a small meal at first, but then you may proceed to your regular diet.  Drink plenty of fluids but you  should avoid alcoholic beverages for 24 hours.  ACTIVITY:  You should plan to take it easy for the rest of today and you should NOT DRIVE or use heavy machinery until tomorrow (because of the sedation medicines used during the test).    FOLLOW UP: Our staff will call the number listed on your records the next business day following your procedure.  We will call around 7:15- 8:00 am to check on you and address any questions or concerns that you may have regarding the information given to you following your procedure. If we do not reach you, we will leave a message.  If you develop any symptoms (ie: fever, flu-like symptoms, shortness of breath, cough etc.) before then, please call [phone].  If you test positive for Covid 19 in the 2 weeks post procedure, please call and report this information to [place].    If any biopsies were taken you will be contacted by phone or by letter within the next 1-3 weeks.  Please call us at [phone] if you have not heard about the biopsies in 3 weeks.    SIGNATURES/CONFIDENTIALITY: You and/or your care partner have signed paperwork which will be entered into your electronic medical record.  These signatures attest to the fact that that the information above on your After Visit Summary has been reviewed and is understood.  Full responsibility of the confidentiality of this discharge information lies with you and/or your care-partner.

## 2022-06-17 NOTE — Progress Notes (Signed)
Report given to PACU, vss 

## 2022-06-17 NOTE — Progress Notes (Signed)
Called to room to assist during endoscopic procedure.  Patient ID and intended procedure confirmed with present staff. Received instructions for my participation in the procedure from the performing physician.  

## 2022-06-17 NOTE — Progress Notes (Signed)
Robinul 0.1 mg IV given due large amount of secretions upon assessment. Patient experiencing nausea.  MD updated and Zofran 4 mg IV given, vss

## 2022-06-17 NOTE — Progress Notes (Signed)
Chief Complaint: For colon  Referring Provider:  [name], PA-C      ASSESSMENT AND PLAN;   #1. H/O polyps  #2. Comorbid conditions include HTN, asthma, HLD, obesity, OSA, anemia of chronic disease.  Plan: -Colon with miralax after cardio clearence from Dr Nadyne Coombes   Discussed risks & benefits of colonoscopy. Risks including rare perforation req laparotomy, bleeding after bx/polypectomy req blood transfusion, rarely missing neoplasms, risks of anesthesia/sedation, rare risk of damage to internal organs. Benefits outweigh the risks. Patient agrees to proceed. All the questions were answered. Pt consents to proceed. HPI:    Bridget Lamb is a 66 y.o. female  With HTN, restrictive airway disease due to bronchial asthma, HLD, HFpEF (38 to 60% 02/2021, being followed by Dr. Einar Gip), Obesity, OSA on CPAP, CKD, H/O endomertial ca s/p TAH with BSO followed by vaginal cuff recurrence s/p XRT 06/2020, anemia of chronic disease (Hb 9-10 range).  For colon  No nausea, vomiting, heartburn, regurgitation, odynophagia or dysphagia.  No melena or hematochezia. No unintentional weight loss. No abdominal pain.  Alt diarrhea and constipation with abdominal bloating x yrs. - in part due to meds and IBS.   Previous GI work-up: Had colon x 4 with Dr Collene Mares Last 8 yrs ago Had polyps on 2nd colon Got letter to rpt.  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 Past Medical History:  Diagnosis Date   Allergic rhinitis    Anemia    hematology, Dr. Julien Nordmann, last 01/2011   Asthma    Cervix cancer ([place])    CHF (congestive heart failure) ([place]) 2004   Chronic kidney disease    protein in urine   Complication of anesthesia    Dyslipidemia    Dyspnea    on excertion   Endometrial cancer ([place])    H/O echocardiogram 01/07/04   mild LVH, nl LV systolic function, EF 91%, left atrial enlargement, mildly thickened aortic and mitral valves   History of radiation therapy  8/4-21-07/24/20   Pelvis IMRT and HDR, Dr. Gery Pray   History of thyroid cancer    Hypertension    Incarcerated ventral hernia 10/07/2013   Obesity, Class III, BMI 40-49.9 (morbid obesity) ([place]) 10/09/2013   Osteopenia    PONV (postoperative nausea and vomiting)    Sleep apnea    Umbilical hernia    Unspecified essential hypertension 10/09/2013   Unspecified hypothyroidism 10/09/2013    Past Surgical History:  Procedure Laterality Date   BONE MARROW BIOPSY     CARDIAC CATHETERIZATION  2005   CHOLECYSTECTOMY  2003   COLONOSCOPY  2008   ROBOTIC ASSISTED TOTAL HYSTERECTOMY WITH BILATERAL SALPINGO OOPHERECTOMY N/A 10/25/2019   Procedure: XI ROBOTIC ASSISTED TOTAL HYSTERECTOMY WITH BILATERAL SALPINGO OOPHORECTOMY;  Surgeon: Lafonda Mosses, MD;  Location: [facility];  Service: Gynecology;  Laterality: N/A;   SENTINEL NODE BIOPSY N/A 10/25/2019   Procedure: SENTINEL LYMPH NODE BIOPSY;  Surgeon: Lafonda Mosses, MD;  Location: [facility];  Service: Gynecology;  Laterality: N/A;   THYROID SURGERY     partial thyroidectomy   TOTAL THYROIDECTOMY  2009   VENTRAL HERNIA REPAIR N/A 10/07/2013   Procedure: HERNIA REPAIR VENTRAL ADULT;  Surgeon: Gwenyth Ober, MD;  Location: [place];  Service: General;  Laterality: N/A;    Family History  Problem Relation Age of Onset   Hypertension Mother    Pancreatic cancer Mother    Lung cancer Father    Breast cancer Maternal Aunt  Ovarian cancer Neg Hx    Endometrial cancer Neg Hx    Colon cancer Neg Hx    Rectal cancer Neg Hx    Stomach cancer Neg Hx     Social History   Tobacco Use   Smoking status: Never   Smokeless tobacco: Never  Vaping Use   Vaping Use: Never used  Substance Use Topics   Alcohol use: No   Drug use: No    Current Outpatient Medications  Medication Sig Dispense Refill   albuterol (PROVENTIL HFA;VENTOLIN HFA) 108 (90 Base) MCG/ACT inhaler Inhale 2 puffs into the lungs every 4 (four) hours as needed for wheezing  or shortness of breath. 1 Inhaler 0   amLODipine (NORVASC) 2.5 MG tablet Take 1 tablet (2.5 mg total) by mouth daily. 30 tablet 3   aspirin EC 81 MG tablet Take 81 mg by mouth daily.     Calcium Carbonate-Vitamin D (CALCIUM 600+D PO) Take 1 tablet by mouth 2 (two) times daily.     dapagliflozin propanediol (FARXIGA) 10 MG TABS tablet Take 1 tablet (10 mg total) by mouth daily. 30 tablet 2   FeFum-FePoly-FA-B Cmp-C-Biot (INTEGRA PLUS) CAPS Take 1 capsule by mouth daily.     fluticasone-salmeterol (ADVAIR) 250-50 MCG/ACT AEPB Inhale 1 puff into the lungs in the morning and at bedtime.     levothyroxine (SYNTHROID) 125 MCG tablet Take 150 mcg by mouth.     loratadine (CLARITIN) 10 MG tablet Take 10 mg by mouth daily.     Nebivolol HCl 20 MG TABS Take 1 tablet by mouth once daily 90 tablet 0   olopatadine (PATANOL) 0.1 % ophthalmic solution Place 1 drop into both eyes 2 (two) times daily.      ondansetron (ZOFRAN-ODT) 4 MG disintegrating tablet Take 1 tablet (4 mg total) by mouth as needed for nausea or vomiting. 4 tablet 0   rosuvastatin (CRESTOR) 10 MG tablet Take 10 mg by mouth at bedtime.     senna-docusate (SENOKOT-S) 8.6-50 MG tablet Take 1 tablet by mouth 2 (two) times daily as needed for moderate constipation.     sodium chloride (OCEAN) 0.65 % SOLN nasal spray Place 1 spray into both nostrils as needed for congestion.     torsemide (DEMADEX) 20 MG tablet Take 1 tablet by mouth once daily 90 tablet 0   Current Facility-Administered Medications  Medication Dose Route Frequency Provider Last Rate Last Admin   0.9 %  sodium chloride infusion  500 mL Intravenous Once Jackquline Denmark, MD        Allergies  Allergen Reactions   Procardia [Nifedipine] Other (See Comments)    Ineffective     Review of Systems:  Constitutional: Denies fever, chills, diaphoresis, appetite change and fatigue.  HEENT: Denies photophobia, eye pain, redness, hearing loss, ear pain, congestion, sore throat,  rhinorrhea, sneezing, mouth sores, neck pain, neck stiffness and tinnitus.   Respiratory: Denies SOB, DOE, cough, chest tightness,  and wheezing.   Cardiovascular: Denies chest pain, palpitations and leg swelling.  Genitourinary: Denies dysuria, urgency, frequency, hematuria, flank pain and difficulty urinating.  Musculoskeletal: Has myalgias, back pain, joint swelling, arthralgias and gait problem.  Skin: No rash.  Neurological: Denies dizziness, seizures, syncope, weakness, light-headedness, numbness and headaches.  Hematological: Denies adenopathy. Easy bruising, personal or family bleeding history  Psychiatric/Behavioral: No anxiety or depression     Physical Exam:    BP 138/66   Pulse (!) 53   Temp (!) 97.5 F (36.4 C)   Ht 5'  4" (1.626 m)   Wt 249 lb (112.9 kg)   SpO2 100%   BMI 42.74 kg/m  Wt Readings from Last 3 Encounters:  06/17/22 249 lb (112.9 kg)  05/13/22 249 lb (112.9 kg)  04/23/22 248 lb 3 oz (112.6 kg)   Constitutional:  Well-developed, in no acute distress. Psychiatric: Normal mood and affect. Behavior is normal. HEENT: Pupils normal.  Conjunctivae are normal. No scleral icterus. Cardiovascular: Normal rate, regular rhythm. No edema Pulmonary/chest: Effort normal and breath sounds normal. No wheezing, rales or rhonchi. Abdominal: Soft, nondistended. Nontender. Bowel sounds active throughout. There are no masses palpable. No hepatomegaly. Rectal: Deferred Neurological: Alert and oriented to person place and time. Skin: Skin is warm and dry. No rashes noted.  Data Reviewed: I have personally reviewed following labs and imaging studies  CBC:    Latest Ref Rng & Units 03/04/2021    3:34 AM 03/03/2021    4:47 AM 03/02/2021    8:29 AM  CBC  WBC 4.0 - 10.5 K/uL 6.3  5.5  5.4   Hemoglobin 12.0 - 15.0 g/dL 9.3  9.1  9.3   Hematocrit 36.0 - 46.0 % 29.2  28.9  29.5   Platelets 150 - 400 K/uL 162  139  164     CMP:    Latest Ref Rng & Units 03/04/2021    3:34  AM 03/03/2021    4:47 AM 03/02/2021    8:29 AM  CMP  Glucose 70 - 99 mg/dL 94  94  129   BUN 8 - 23 mg/dL 23  30  36   Creatinine 0.44 - 1.00 mg/dL 1.26  1.28  1.33   Sodium 135 - 145 mmol/L 140  142  141   Potassium 3.5 - 5.1 mmol/L 3.7  3.9  3.6   Chloride 98 - 111 mmol/L 96  101  103   CO2 22 - 32 mmol/L 36  34  32   Calcium 8.9 - 10.3 mg/dL 9.2  9.0  8.7   Total Protein 6.5 - 8.1 g/dL   5.9   Total Bilirubin 0.3 - 1.2 mg/dL   0.4   Alkaline Phos 38 - 126 U/L   62   AST 15 - 41 U/L   18   ALT 0 - 44 U/L   17         Carmell Austria, MD 06/17/2022, 8:55 AM  Cc: [name], PA-C

## 2022-06-17 NOTE — Op Note (Signed)
[place] Patient Name: Bridget Lamb Procedure Date: 06/17/2022 8:57 AM MRN: [id number] Endoscopist: Jackquline Denmark , MD Age: 66 Referring MD:  Date of Birth: [birth year] Gender: Female Account #: [id number] Procedure:                Colonoscopy Indications:              High risk colon cancer surveillance: Personal                            history of colonic polyps Medicines:                Monitored Anesthesia Care Procedure:                Pre-Anesthesia Assessment:                           - Prior to the procedure, a History and Physical                            was performed, and patient medications and                            allergies were reviewed. The patient's tolerance of                            previous anesthesia was also reviewed. The risks                            and benefits of the procedure and the sedation                            options and risks were discussed with the patient.                            All questions were answered, and informed consent                            was obtained. Prior Anticoagulants: The patient has                            taken no previous anticoagulant or antiplatelet                            agents. ASA Grade Assessment: III - A patient with                            severe systemic disease. After reviewing the risks                            and benefits, the patient was deemed in                            satisfactory condition to undergo the procedure.  After obtaining informed consent, the colonoscope                            was passed under direct vision. Throughout the                            procedure, the patient's blood pressure, pulse, and                            oxygen saturations were monitored continuously. The                            Olympus CF-HQ190L (Serial# [id number]) Colonoscope was                            introduced through the anus and  advanced to the 2                            cm into the ileum. The colonoscopy was performed                            without difficulty. The patient tolerated the                            procedure well. The quality of the bowel                            preparation was good. The terminal ileum, ileocecal                            valve, appendiceal orifice, and rectum were                            photographed. Scope In: 9:04:00 AM Scope Out: 9:23:30 AM Scope Withdrawal Time: 0 hours 16 minutes 9 seconds  Total Procedure Duration: 0 hours 19 minutes 30 seconds  Findings:                 Two sessile polyps were found in the proximal                            transverse colon (10 mm) and proximal ascending                            colon (2 mm). These polyps were removed with a cold                            snare. Resection and retrieval were complete.                           A few medium-mouthed diverticula were found in the                            sigmoid colon and descending colon.  Non-bleeding internal hemorrhoids were found during                            retroflexion. The hemorrhoids were small and Grade                            I (internal hemorrhoids that do not prolapse).                           The terminal ileum appeared normal.                           The exam was otherwise without abnormality on                            direct and retroflexion views. Complications:            No immediate complications. Estimated Blood Loss:     Estimated blood loss: none. Impression:               - Colon polyps s/p polypectomy.                           - Mild left colonic diverticulosis.                           - Non-bleeding internal hemorrhoids.                           - The examined portion of the ileum was normal.                           - The examination was otherwise normal on direct                            and  retroflexion views. Recommendation:           - Patient has a contact number available for                            emergencies. The signs and symptoms of potential                            delayed complications were discussed with the                            patient. Return to normal activities tomorrow.                            Written discharge instructions were provided to the                            patient.                           - High fiber diet.                           -  Continue present medications.                           - Await pathology results.                           - Repeat colonoscopy for surveillance based on                            pathology results.                           - The findings and recommendations were discussed                            with the patient. Jackquline Denmark, MD 06/17/2022 9:33:22 AM This report has been signed electronically.

## 2022-06-18 ENCOUNTER — Telehealth: Payer: Self-pay

## 2022-06-18 NOTE — Telephone Encounter (Signed)
  Follow up Call-     06/17/2022    8:37 AM  Call back number  Post procedure Call Back phone  # [phone]  Permission to leave phone message Yes     Patient questions:  Do you have a fever, pain , or abdominal swelling? No. Pain Score  0 *  Have you tolerated food without any problems? Yes.    Have you been able to return to your normal activities? Yes.    Do you have any questions about your discharge instructions: Diet   No. Medications  No. Follow up visit  No.  Do you have questions or concerns about your Care? Yes.  Pt. Has a sore throat, but reports it is better today than yesterday.  Told pt. It should continue to improve, but To call if she has any further questions or concerns.  Actions: * If pain score is 4 or above: No action needed, pain <4.

## 2022-06-26 ENCOUNTER — Encounter: Payer: Self-pay | Admitting: Gastroenterology

## 2022-07-06 ENCOUNTER — Other Ambulatory Visit: Payer: Self-pay

## 2022-07-06 DIAGNOSIS — I10 Essential (primary) hypertension: Secondary | ICD-10-CM

## 2022-07-06 DIAGNOSIS — I5032 Chronic diastolic (congestive) heart failure: Secondary | ICD-10-CM

## 2022-07-06 MED ORDER — TORSEMIDE 20 MG PO TABS: 20.0000 mg | ORAL_TABLET | Freq: Every day | ORAL | 0 refills | Status: DC

## 2022-07-06 MED ORDER — NEBIVOLOL HCL 20 MG PO TABS: 1.0000 | ORAL_TABLET | Freq: Every day | ORAL | 0 refills | Status: DC

## 2022-07-07 ENCOUNTER — Other Ambulatory Visit: Payer: Self-pay

## 2022-07-07 MED ORDER — AMLODIPINE BESYLATE 2.5 MG PO TABS: 2.5000 mg | ORAL_TABLET | Freq: Every day | ORAL | 3 refills | Status: DC

## 2022-07-08 DIAGNOSIS — I509 Heart failure, unspecified: Secondary | ICD-10-CM | POA: Diagnosis not present

## 2022-07-08 DIAGNOSIS — C541 Malignant neoplasm of endometrium: Secondary | ICD-10-CM | POA: Diagnosis not present

## 2022-07-08 DIAGNOSIS — Z23 Encounter for immunization: Secondary | ICD-10-CM | POA: Diagnosis not present

## 2022-07-08 DIAGNOSIS — Z6841 Body Mass Index (BMI) 40.0 and over, adult: Secondary | ICD-10-CM | POA: Diagnosis not present

## 2022-07-08 DIAGNOSIS — Z0000 Encounter for general adult medical examination without abnormal findings: Secondary | ICD-10-CM | POA: Diagnosis not present

## 2022-07-08 DIAGNOSIS — I10 Essential (primary) hypertension: Secondary | ICD-10-CM | POA: Diagnosis not present

## 2022-07-08 DIAGNOSIS — E039 Hypothyroidism, unspecified: Secondary | ICD-10-CM | POA: Diagnosis not present

## 2022-07-15 ENCOUNTER — Telehealth: Payer: Self-pay

## 2022-07-15 NOTE — Telephone Encounter (Signed)
Patient called in to schedule follow up with Dr Berline Lopes.  Appointment scheduled for 12/29 @ 2:30pm.  Patient confirmed and understood.

## 2022-07-21 NOTE — Progress Notes (Signed)
Radiation Oncology         [phone] ________________________________  Name: Bridget Lamb MRN: [id number]  Date: 07/22/2022  DOB: October 04, 1956  Follow-Up Visit Note  CC: [name]  [name], PA-C  No diagnosis found.  Diagnosis: Recurrent FIGO stage IA (cT1a, cN0, cM0) endometrial adenocarcinoma, grade 1   Interval Since Last Radiation: Approximately 2 years   Radiation Treatment Dates: 05/28/2020 through 07/24/2020 Site Technique Total Dose (Gy) Dose per Fx (Gy) Completed Fx Beam Energies  Pelvis: Pelvis IMRT 45/45 1.8 25/25 6X  Vagina: Pelvis_Bst         Narrative:  The patient returns today for routine follow-up, she was last seen here for follow up on 01/21/22. Since her last visit, the patient followed up with Dr. Berline Lopes on 04/23/22. During which time, the patient denied any symptoms concerning for disease recurrence and was noted as NED on examination.  Mild agglutination was noted on exam in the vaginal apex, and the patient was encouraged to use her vaginal dilator more frequently.       Imaging performed in the interval includes a bilateral screening mammogram on 05/12/22 which showed no evidence of malignancy in either breast.       Of note: Patient had a colonoscopy performed on 06/17/22. Biopsies of the colon collected showed fragments of low-grade dysplasia/tubular adenomatous epithelium and sessile polyps.                    ***  Allergies:  is allergic to procardia [nifedipine].  Meds: Current Outpatient Medications  Medication Sig Dispense Refill   albuterol (PROVENTIL HFA;VENTOLIN HFA) 108 (90 Base) MCG/ACT inhaler Inhale 2 puffs into the lungs every 4 (four) hours as needed for wheezing or shortness of breath. 1 Inhaler 0   amLODipine (NORVASC) 2.5 MG tablet Take 1 tablet (2.5 mg total) by mouth daily. 30 tablet 3   aspirin EC 81 MG tablet Take 81 mg by mouth daily.     Calcium Carbonate-Vitamin D (CALCIUM 600+D PO) Take 1 tablet by  mouth 2 (two) times daily.     dapagliflozin propanediol (FARXIGA) 10 MG TABS tablet Take 1 tablet (10 mg total) by mouth daily. 30 tablet 2   FeFum-FePoly-FA-B Cmp-C-Biot (INTEGRA PLUS) CAPS Take 1 capsule by mouth daily.     fluticasone-salmeterol (ADVAIR) 250-50 MCG/ACT AEPB Inhale 1 puff into the lungs in the morning and at bedtime.     levothyroxine (SYNTHROID) 125 MCG tablet Take 150 mcg by mouth.     loratadine (CLARITIN) 10 MG tablet Take 10 mg by mouth daily.     Nebivolol HCl 20 MG TABS Take 1 tablet (20 mg total) by mouth daily. 30 tablet 0   olopatadine (PATANOL) 0.1 % ophthalmic solution Place 1 drop into both eyes 2 (two) times daily.      ondansetron (ZOFRAN-ODT) 4 MG disintegrating tablet Take 1 tablet (4 mg total) by mouth as needed for nausea or vomiting. 4 tablet 0   rosuvastatin (CRESTOR) 10 MG tablet Take 10 mg by mouth at bedtime.     senna-docusate (SENOKOT-S) 8.6-50 MG tablet Take 1 tablet by mouth 2 (two) times daily as needed for moderate constipation.     sodium chloride (OCEAN) 0.65 % SOLN nasal spray Place 1 spray into both nostrils as needed for congestion.     torsemide (DEMADEX) 20 MG tablet Take 1 tablet (20 mg total) by mouth daily. 30 tablet 0   No current facility-administered medications for  this encounter.    Physical Findings: The patient is in no acute distress. Patient is alert and oriented.  vitals were not taken for this visit. .  No significant changes. Lungs are clear to auscultation bilaterally. Heart has regular rate and rhythm. No palpable cervical, supraclavicular, or axillary adenopathy. Abdomen soft, non-tender, normal bowel sounds.  On pelvic examination the external genitalia were unremarkable. A speculum exam was performed. There are no mucosal lesions noted in the vaginal vault. A Pap smear was obtained of the proximal vagina. On bimanual and rectovaginal examination there were no pelvic masses appreciated. ***   Lab Findings: Lab  Results  Component Value Date   WBC 6.3 03/04/2021   HGB 9.3 (L) 03/04/2021   HCT 29.2 (L) 03/04/2021   MCV 98.6 03/04/2021   PLT 162 03/04/2021    Radiographic Findings: No results found.  Impression:  Recurrent FIGO stage IA (cT1a, cN0, cM0) endometrial adenocarcinoma, grade 1    The patient is recovering from the effects of radiation.  ***  Plan:  ***   *** minutes of total time was spent for this patient encounter, including preparation, face-to-face counseling with the patient and coordination of care, physical exam, and documentation of the encounter. ____________________________________  Blair Promise, PhD, MD  This document serves as a record of services personally performed by Gery Pray, MD. It was created on his behalf by Roney Mans, a trained medical scribe. The creation of this record is based on the scribe's personal observations and the provider's statements to them. This document has been checked and approved by the attending provider.

## 2022-07-22 ENCOUNTER — Ambulatory Visit
Admission: RE | Admit: 2022-07-22 | Discharge: 2022-07-22 | Disposition: A | Discharge status: Home / Self Care | Payer: BC Managed Care – PPO | Source: Ambulatory Visit | Attending: Radiation Oncology | Admitting: Radiation Oncology

## 2022-07-22 ENCOUNTER — Encounter: Payer: Self-pay | Admitting: Radiation Oncology

## 2022-07-22 DIAGNOSIS — Z923 Personal history of irradiation: Secondary | ICD-10-CM | POA: Insufficient documentation

## 2022-07-22 DIAGNOSIS — C541 Malignant neoplasm of endometrium: Secondary | ICD-10-CM | POA: Diagnosis not present

## 2022-07-22 DIAGNOSIS — Z8542 Personal history of malignant neoplasm of other parts of uterus: Secondary | ICD-10-CM | POA: Insufficient documentation

## 2022-07-22 NOTE — Progress Notes (Signed)
Bridget Lamb is here today for follow up post radiation to the pelvic.  They completed their radiation on: 07/24/20  Does the patient complain of any of the following:  Pain:No Abdominal bloating: No Diarrhea/Constipation:  Constipation, relieved with suppository. Encouraged patient to use Miralax daily. Patient voiced understanding.  Nausea/Vomiting: No Vaginal Discharge: No Blood in Urine or Stool: No Urinary Issues (dysuria/incomplete emptying/ incontinence/ increased frequency/urgency): No Does patient report using vaginal dilator 2-3 times a week and/or sexually active 2-3 weeks: Patient has not used in dilators in the last week.  Post radiation skin changes: No    Additional comments if applicable:   BP 947/65 (BP Location: Left Arm, Patient Position: Sitting)   Pulse (!) 59   Temp 98.1 F (36.7 C) (Oral)   Resp 18   Ht '5\' 4"'$  (1.626 m)   Wt 255 lb 2 oz (115.7 kg)   SpO2 100%   BMI 43.79 kg/m

## 2022-08-12 ENCOUNTER — Ambulatory Visit: Payer: BC Managed Care – PPO

## 2022-08-12 ENCOUNTER — Ambulatory Visit: Payer: BC Managed Care – PPO | Admitting: Student

## 2022-08-12 VITALS — BP 132/80 | HR 64 | Temp 97.8°F | Resp 16 | Ht 64.0 in | Wt 256.0 lb

## 2022-08-12 DIAGNOSIS — I5032 Chronic diastolic (congestive) heart failure: Secondary | ICD-10-CM | POA: Diagnosis not present

## 2022-08-12 DIAGNOSIS — I10 Essential (primary) hypertension: Secondary | ICD-10-CM

## 2022-08-12 DIAGNOSIS — R200 Anesthesia of skin: Secondary | ICD-10-CM

## 2022-08-12 DIAGNOSIS — I6523 Occlusion and stenosis of bilateral carotid arteries: Secondary | ICD-10-CM

## 2022-08-12 NOTE — Progress Notes (Signed)
Primary Physician/Referring:  [name], PA-C  Patient ID: Bridget Lamb, female    DOB: 01/02/1956, 66 y.o.   MRN: [id number]  Chief Complaint  Patient presents with   Hypertension   HFpEF   Follow-up    6 month   HPI:    Bridget Lamb  is a 66 y.o. female with morbid obesity BMI 44, hypertension, restrictive airway disease due to bronchial asthma, hyperlipidemia, HFpEF, OSA now compliant with CPAP.  Patient has had multiple hospitalizations with acute on chronic diastolic heart failure in 02/2020 and again 02/2021.    Patient presents for 6 month follow-up.  At last office visit patient was started on Amlodipine 2.$RemoveBeforeD'5mg'SyCSOrbPtVyMSE$  for suspected Raynaud's syndrome. She has stopped taking this due to lower leg swelling. Overall, she is doing well without complaints. She does have mild shortness of breath when walking at incline or climbing stairs, this is chronic and has not progressively worsened. She does stand for 8 hours at work however, she has not been active outside of work. She does admit that her diet could be better. She is compliant with CPAP. She does still have numbness and tingling in both pinky fingers. Previous upper extremity duplex was normal. She denies chest pain, palpitations, leg edema, orthopnea, PND, TIA/syncope.  Past Medical History:  Diagnosis Date   Allergic rhinitis    Anemia    hematology, Dr. Julien Nordmann, last 01/2011   Asthma    Cervix cancer ([place])    CHF (congestive heart failure) ([place]) 2004   Chronic kidney disease    protein in urine   Complication of anesthesia    Dyslipidemia    Dyspnea    on excertion   Endometrial cancer ([place])    H/O echocardiogram 01/07/04   mild LVH, nl LV systolic function, EF 94%, left atrial enlargement, mildly thickened aortic and mitral valves   History of radiation therapy 8/4-21-07/24/20   Pelvis IMRT and HDR, Dr. Gery Pray   History of thyroid cancer    Hypertension    Incarcerated ventral hernia 10/07/2013    Obesity, Class III, BMI 40-49.9 (morbid obesity) ([place]) 10/09/2013   Osteopenia    PONV (postoperative nausea and vomiting)    Sleep apnea    Umbilical hernia    Unspecified essential hypertension 10/09/2013   Unspecified hypothyroidism 10/09/2013   Past Surgical History:  Procedure Laterality Date   BONE MARROW BIOPSY     CARDIAC CATHETERIZATION  2005   CHOLECYSTECTOMY  2003   COLONOSCOPY  2008   ROBOTIC ASSISTED TOTAL HYSTERECTOMY WITH BILATERAL SALPINGO OOPHERECTOMY N/A 10/25/2019   Procedure: XI ROBOTIC ASSISTED TOTAL HYSTERECTOMY WITH BILATERAL SALPINGO OOPHORECTOMY;  Surgeon: Lafonda Mosses, MD;  Location: [facility];  Service: Gynecology;  Laterality: N/A;   SENTINEL NODE BIOPSY N/A 10/25/2019   Procedure: SENTINEL LYMPH NODE BIOPSY;  Surgeon: Lafonda Mosses, MD;  Location: [facility];  Service: Gynecology;  Laterality: N/A;   THYROID SURGERY     partial thyroidectomy   TOTAL THYROIDECTOMY  2009   VENTRAL HERNIA REPAIR N/A 10/07/2013   Procedure: HERNIA REPAIR VENTRAL ADULT;  Surgeon: Gwenyth Ober, MD;  Location: [facility];  Service: General;  Laterality: N/A;   Family History  Problem Relation Age of Onset   Hypertension Mother    Pancreatic cancer Mother    Lung cancer Father    Breast cancer Maternal Aunt    Ovarian cancer Neg Hx    Endometrial cancer Neg Hx    Colon cancer Neg  Hx    Rectal cancer Neg Hx    Stomach cancer Neg Hx     Social History   Tobacco Use   Smoking status: Never   Smokeless tobacco: Never  Substance Use Topics   Alcohol use: No   Marital Status: Divorced  ROS  Review of Systems  Constitutional: Negative for malaise/fatigue and weight gain.  Cardiovascular:  Positive for dyspnea on exertion (chronic, stable) and leg swelling (minimal, wears compression stockings - stable). Negative for chest pain, claudication, near-syncope, palpitations, paroxysmal nocturnal dyspnea and syncope.  Respiratory:  Positive for snoring (now compliant with  CPAP).   Neurological:  Negative for dizziness.   Objective  Blood pressure 132/80, pulse 64, temperature 97.8 F (36.6 C), temperature source Temporal, resp. rate 16, height $RemoveBe'5\' 4"'iqONuanyU$  (1.626 m), weight 256 lb (116.1 kg), SpO2 96 %.     08/12/2022    9:18 AM 08/12/2022    8:43 AM 07/22/2022   10:29 AM  Vitals with BMI  Height  $Remov'5\' 4"'YaTRRr$  $Remove'5\' 4"'QrRgmut$   Weight  256 lbs 255 lbs 2 oz  BMI  95.28 41.32  Systolic 440 102 725  Diastolic 80 75 76  Pulse  64 59     Physical Exam Vitals reviewed.  Constitutional:      Appearance: She is well-developed.     Comments: Morbidly obese  Cardiovascular:     Rate and Rhythm: Normal rate and regular rhythm.     Pulses: Intact distal pulses.          Carotid pulses are  on the right side with bruit and  on the left side with bruit.      Radial pulses are 2+ on the right side and 2+ on the left side.     Heart sounds: S1 normal and S2 normal. Murmur heard.     Early systolic murmur is present with a grade of 1/6 at the apex.     Harsh mid to late systolic murmur is also present at the upper right sternal border.     No gallop.     Comments: No JVD  Pulmonary:     Effort: Pulmonary effort is normal. No accessory muscle usage or respiratory distress.     Breath sounds: Normal breath sounds. No wheezing, rhonchi or rales.  Abdominal:     Comments: Pannus present  Musculoskeletal:        General: No tenderness.     Right lower leg: 1+ Pitting Edema present.     Left lower leg: 1+ Pitting Edema present.  Skin:    General: Skin is warm and dry.     Capillary Refill: Capillary refill takes less than 2 seconds.  Neurological:     Mental Status: She is alert.    Laboratory examination:   No results for input(s): "NA", "K", "CL", "CO2", "GLUCOSE", "BUN", "CREATININE", "CALCIUM", "GFRNONAA", "GFRAA" in the last 8760 hours.  CrCl cannot be calculated (Patient's most recent lab result is older than the maximum 21 days allowed.).     Latest Ref Rng & Units  03/04/2021    3:34 AM 03/03/2021    4:47 AM 03/02/2021    8:29 AM  CMP  Glucose 70 - 99 mg/dL 94  94  129   BUN 8 - 23 mg/dL 23  30  36   Creatinine 0.44 - 1.00 mg/dL 1.26  1.28  1.33   Sodium 135 - 145 mmol/L 140  142  141   Potassium 3.5 - 5.1 mmol/L  3.7  3.9  3.6   Chloride 98 - 111 mmol/L 96  101  103   CO2 22 - 32 mmol/L 36  34  32   Calcium 8.9 - 10.3 mg/dL 9.2  9.0  8.7   Total Protein 6.5 - 8.1 g/dL   5.9   Total Bilirubin 0.3 - 1.2 mg/dL   0.4   Alkaline Phos 38 - 126 U/L   62   AST 15 - 41 U/L   18   ALT 0 - 44 U/L   17       Latest Ref Rng & Units 03/04/2021    3:34 AM 03/03/2021    4:47 AM 03/02/2021    8:29 AM  CBC  WBC 4.0 - 10.5 K/uL 6.3  5.5  5.4   Hemoglobin 12.0 - 15.0 g/dL 9.3  9.1  9.3   Hematocrit 36.0 - 46.0 % 29.2  28.9  29.5   Platelets 150 - 400 K/uL 162  139  164    TSH No results for input(s): "TSH" in the last 8760 hours.  BNP (last 3 results) No results for input(s): "BNP" in the last 8760 hours.  Magnesium:  No results for input(s): "MG" in the last 8760 hours.   External labs:  03/16/2021: NT proBNP 1054 Glucose 96, BUN 29, creatinine 1.53, GFR 30, sodium 142, potassium 4.9, AST 16, ALT 13, alk phos 99  02/28/2021: HDL 49, LDL 57, total cholesterol 117, triglycerides 54 A1c 5.4% BUN 23, creatinine 1.26,     Allergies   Allergies  Allergen Reactions   Procardia [Nifedipine] Other (See Comments)    Ineffective     Medications Prior to Visit:   Outpatient Medications Prior to Visit  Medication Sig Dispense Refill   albuterol (PROVENTIL HFA;VENTOLIN HFA) 108 (90 Base) MCG/ACT inhaler Inhale 2 puffs into the lungs every 4 (four) hours as needed for wheezing or shortness of breath. 1 Inhaler 0   aspirin EC 81 MG tablet Take 81 mg by mouth daily.     Calcium Carbonate-Vitamin D (CALCIUM 600+D PO) Take 1 tablet by mouth 2 (two) times daily.     dapagliflozin propanediol (FARXIGA) 10 MG TABS tablet Take 1 tablet (10 mg total) by mouth daily.  30 tablet 2   FeFum-FePoly-FA-B Cmp-C-Biot (INTEGRA PLUS) CAPS Take 1 capsule by mouth daily.     fluticasone-salmeterol (ADVAIR) 250-50 MCG/ACT AEPB Inhale 1 puff into the lungs in the morning and at bedtime.     levothyroxine (SYNTHROID) 125 MCG tablet Take 150 mcg by mouth.     loratadine (CLARITIN) 10 MG tablet Take 10 mg by mouth daily.     Nebivolol HCl 20 MG TABS Take 1 tablet (20 mg total) by mouth daily. 30 tablet 0   olopatadine (PATANOL) 0.1 % ophthalmic solution Place 1 drop into both eyes 2 (two) times daily.      ondansetron (ZOFRAN-ODT) 4 MG disintegrating tablet Take 1 tablet (4 mg total) by mouth as needed for nausea or vomiting. 4 tablet 0   rosuvastatin (CRESTOR) 10 MG tablet Take 10 mg by mouth at bedtime.     senna-docusate (SENOKOT-S) 8.6-50 MG tablet Take 1 tablet by mouth 2 (two) times daily as needed for moderate constipation.     sodium chloride (OCEAN) 0.65 % SOLN nasal spray Place 1 spray into both nostrils as needed for congestion.     torsemide (DEMADEX) 20 MG tablet Take 1 tablet (20 mg total) by mouth daily. 30 tablet 0  amLODipine (NORVASC) 2.5 MG tablet Take 1 tablet (2.5 mg total) by mouth daily. (Patient not taking: Reported on 08/12/2022) 30 tablet 3   No facility-administered medications prior to visit.   Final Medications at End of Visit    Current Meds  Medication Sig   albuterol (PROVENTIL HFA;VENTOLIN HFA) 108 (90 Base) MCG/ACT inhaler Inhale 2 puffs into the lungs every 4 (four) hours as needed for wheezing or shortness of breath.   aspirin EC 81 MG tablet Take 81 mg by mouth daily.   Calcium Carbonate-Vitamin D (CALCIUM 600+D PO) Take 1 tablet by mouth 2 (two) times daily.   dapagliflozin propanediol (FARXIGA) 10 MG TABS tablet Take 1 tablet (10 mg total) by mouth daily.   FeFum-FePoly-FA-B Cmp-C-Biot (INTEGRA PLUS) CAPS Take 1 capsule by mouth daily.   fluticasone-salmeterol (ADVAIR) 250-50 MCG/ACT AEPB Inhale 1 puff into the lungs in the morning  and at bedtime.   levothyroxine (SYNTHROID) 125 MCG tablet Take 150 mcg by mouth.   loratadine (CLARITIN) 10 MG tablet Take 10 mg by mouth daily.   Nebivolol HCl 20 MG TABS Take 1 tablet (20 mg total) by mouth daily.   olopatadine (PATANOL) 0.1 % ophthalmic solution Place 1 drop into both eyes 2 (two) times daily.    ondansetron (ZOFRAN-ODT) 4 MG disintegrating tablet Take 1 tablet (4 mg total) by mouth as needed for nausea or vomiting.   rosuvastatin (CRESTOR) 10 MG tablet Take 10 mg by mouth at bedtime.   senna-docusate (SENOKOT-S) 8.6-50 MG tablet Take 1 tablet by mouth 2 (two) times daily as needed for moderate constipation.   sodium chloride (OCEAN) 0.65 % SOLN nasal spray Place 1 spray into both nostrils as needed for congestion.   torsemide (DEMADEX) 20 MG tablet Take 1 tablet (20 mg total) by mouth daily.    Radiology:   Chest x-ray 02/27/2021: Stable enlarged cardiac silhouette. There is central venous congestion in mild peripheral interstitial markings increased from prior. Small effusions. No focal consolidation. Degenerative osteophytosis of the spine.   IMPRESSION: Cardiomegaly central venous congestion, interstitial prominence, and small effusions suggest volume overload/congestive heart failure.  CT angio chest PE 02/27/2021: 1. No pulmonary embolus or acute aortic syndrome. 2. Cardiomegaly, small pleural effusions and pulmonary edema, likely congestive heart failure. Aortic atherosclerosis (ICD10-I70.0).   CT angiography chest with contrast 02/20/2020: 1. No evidence for acute pulmonary embolus. 2. Patchy ground-glass and consolidative opacities throughout the aerated lungs bilaterally which may represent edema or infection. Additionally, there are scattered areas of consolidation within the lungs bilaterally which may represent atelectasis or infection. Consider follow-up chest CT in 6-8 weeks after resolution of acute symptomatology to ensure the nodularity resolves. 3.  Moderate bilateral pleural effusions. 4. Prominent mediastinal lymph nodes, likely reactive. 5. Dilated main pulmonary artery as can be seen with pulmonary arterial hypertension. 6. Aortic atherosclerosis.  Cardiac Studies:   Lexiscan Sestamibi stress test 03/10/2020: No previous exam available for comparison. Lexiscan nuclear stress test performed using 1-day protocol. Stress EKG is non-diagnostic, as this is pharmacological stress test. Normal myocardial perfusion. Stress LVEF 59%. Low risk study.   Echocardiogram 03/01/2021: 1. Left ventricular ejection fraction, by estimation, is 55 to 60%. The  left ventricle has normal function. Left ventricular endocardial border  not optimally defined to evaluate regional wall motion. There is mild left  ventricular hypertrophy. Left  ventricular diastolic parameters are consistent with Grade II diastolic  dysfunction (pseudonormalization). Elevated left ventricular end-diastolic  pressure.   2. Right ventricular systolic function is  normal. The right ventricular  size is mildly enlarged. Mildly increased right ventricular wall  thickness. There is moderately elevated pulmonary artery systolic  pressure.   3. Left atrial size was moderately dilated.   4. Right atrial size was mildly dilated.   5. The mitral valve is normal in structure. Mild to moderate mitral valve  regurgitation.   6. Tricuspid valve regurgitation is mild to moderate.   7. The aortic valve is normal in structure. Aortic valve regurgitation is  not visualized. No aortic stenosis is present.   8. The inferior vena cava is dilated in size with <50% respiratory  variability, suggesting right atrial pressure of 15 mmHg.   Carotid artery duplex 01/13/2022:  Duplex suggests stenosis in the right internal carotid artery (50-69%).  Duplex suggests stenosis in the right external carotid artery (<50%) with  heterogeneous plaque.  Duplex suggests stenosis in the left internal  carotid artery (minimal)  with mostly homogeneous plaque.  Antegrade right vertebral artery flow.  Compared to 07/16/2021, regression of stenosis left ICA. Follow up in six  months is appropriate if clinically indicated.  Upper extremity arterial duplex 01/13/2022:  No significant elevation of the peak systolic velocity is seen in the  right or left upper extremity to suggest a hemodynamically significant  stenosis. Normal triphasic waveform pattern noted.   EKG   EKG 08/12/2022: Sinus rhythm with first-degree AV block at rate of 61 bpm.  Normal axis.  Incomplete right bundle branch block.  Low voltage axis pulmonary disease pattern.  Compared to previous EKG on 07/02/2021, no significant change.  Assessment     ICD-10-CM   1. Chronic diastolic CHF (congestive heart failure) (HCC)  I50.32 EKG 12-Lead    2. Primary hypertension  I10     3. Bilateral carotid artery stenosis  I65.23     4. Numbness of fifth metacarpal of both hands  R20.0       No orders of the defined types were placed in this encounter.  Medications Discontinued During This Encounter  Medication Reason   amLODipine (NORVASC) 2.5 MG tablet Side effect (s)     Recommendations:   Chronic diastolic CHF (congestive heart failure) (HCC) No clinical evidence of heart failure. She is guideline directed therapy.  We discussed at length the importance of lifestyle modifications including diet, exercise, and weight loss. She appears motivated to make changes. No changes to medications.  Primary hypertension Initial blood pressure elevated at today's visit, manual recheck 132/80. She does not monitor her blood pressure at home. Advised her to monitor blood pressure and keep log of readings to bring to next visit. If she consistently has BP >140/80 she was will call office. She has stopped amlodipine, that was started for suspected Raynaud's syndrome, due to BLE edema. Will not make changes to medications today, suspect  initial elevated blood pressure could be component of white coat hypertension. Advised patient to have labs checked that were ordered at prior visit.  Bilateral carotid artery stenosis Scheduled for carotid artery duplex today 08/12/22.  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 Advised bilateral hands splints and follow-up with PCP.  Follow-up in 3 months, sooner if needed.   Ernst Spell, AGNP-C 08/12/2022, 9:36 AM Office: [phone]

## 2022-08-17 HISTORY — PX: COLONOSCOPY: SHX174

## 2022-08-18 ENCOUNTER — Other Ambulatory Visit: Payer: Self-pay | Admitting: Cardiology

## 2022-08-18 DIAGNOSIS — I10 Essential (primary) hypertension: Secondary | ICD-10-CM

## 2022-08-18 DIAGNOSIS — I5032 Chronic diastolic (congestive) heart failure: Secondary | ICD-10-CM

## 2022-08-19 ENCOUNTER — Other Ambulatory Visit: Payer: Self-pay

## 2022-08-19 MED ORDER — TORSEMIDE 20 MG PO TABS: 20.0000 mg | ORAL_TABLET | Freq: Every day | ORAL | 3 refills | Status: DC

## 2022-08-31 NOTE — Telephone Encounter (Signed)
From patient.

## 2022-09-23 DIAGNOSIS — M8589 Other specified disorders of bone density and structure, multiple sites: Secondary | ICD-10-CM | POA: Diagnosis not present

## 2022-09-23 DIAGNOSIS — M85852 Other specified disorders of bone density and structure, left thigh: Secondary | ICD-10-CM | POA: Diagnosis not present

## 2022-10-20 ENCOUNTER — Encounter: Payer: Self-pay | Admitting: Gynecologic Oncology

## 2022-10-22 ENCOUNTER — Inpatient Hospital Stay: Payer: BC Managed Care – PPO | Attending: Gynecologic Oncology | Admitting: Gynecologic Oncology

## 2022-10-22 ENCOUNTER — Encounter: Payer: Self-pay | Admitting: Gynecologic Oncology

## 2022-10-22 ENCOUNTER — Other Ambulatory Visit: Payer: Self-pay

## 2022-10-22 VITALS — BP 145/48 | HR 56 | Temp 98.9°F | Ht 64.0 in | Wt 250.7 lb

## 2022-10-22 DIAGNOSIS — Z8542 Personal history of malignant neoplasm of other parts of uterus: Secondary | ICD-10-CM | POA: Diagnosis not present

## 2022-10-22 DIAGNOSIS — Z90710 Acquired absence of both cervix and uterus: Secondary | ICD-10-CM | POA: Insufficient documentation

## 2022-10-22 DIAGNOSIS — C541 Malignant neoplasm of endometrium: Secondary | ICD-10-CM

## 2022-10-22 DIAGNOSIS — Z90722 Acquired absence of ovaries, bilateral: Secondary | ICD-10-CM | POA: Diagnosis not present

## 2022-10-22 DIAGNOSIS — Z08 Encounter for follow-up examination after completed treatment for malignant neoplasm: Secondary | ICD-10-CM | POA: Diagnosis not present

## 2022-10-22 DIAGNOSIS — Z923 Personal history of irradiation: Secondary | ICD-10-CM | POA: Insufficient documentation

## 2022-10-22 NOTE — Patient Instructions (Addendum)
It was good to see you today.  I do not see or feel any evidence of cancer recurrence on your exam.  I will see you for follow-up in 9 months.  Please call back sometime over the summer to schedule a visit to see me in September 2024.  As always, if you develop any new and concerning symptoms before your next visit, please call to see me sooner.

## 2022-10-22 NOTE — Progress Notes (Signed)
Gynecologic Oncology Return Clinic Visit  10/22/22  Reason for Visit: surveillance in the setting of a history of endometrial cancer   Treatment History: Oncology History Overview Note  CA-125  10/12/19: 48.9  MSI stable   Endometrial cancer ([place])  09/19/2019 Initial Diagnosis   Endometrial cancer ([place])   09/19/2019 Initial Biopsy   EMB: EMCA with clear cell features   10/17/2019 Imaging   CT A/P: Mild endometrial thickening in this patient with known endometrial cancer. No evidence of metastatic disease in the abdomen/pelvis.  CXR 12/28: No evidence of acute cardiopulmonary abnormality.   10/24/2019 Cancer Staging   Staging form: Corpus Uteri - Carcinoma and Carcinosarcoma, AJCC 8th Edition - Clinical stage from 10/24/2019: FIGO Stage IA (cT1a, cN0(sn), cM0) - Signed by Lafonda Mosses, MD on 05/01/2020   10/25/2019 Surgery   TRH/BSO, bil SLN, repair of sulcal tear Findings: On EUA, small cervix, moderately mobile uterus, no adnexal masses.  On intra-abdominal entry, adhesions from the omentum to the anterior wall at the patient's prior hernia repair as well as to the lateral anterior wall bilaterally.  Right aspect of the liver and diaphragm of secured by adhesions.  Left liver edge, diaphragm and stomach normal in appearance.  Uterus 10 cm in bulbous.  Posterior cul-de-sac completely obliterated and bilateral fallopian tubes and adnexa adherent to posterior uterus and sigmoid mesentery.  Fallopian tubes and left ovary somewhat concerning for involvement by tumor versus prior history of pelvic infection or endometriosis.  Mapping successful to bilateral external iliac sentinel lymph nodes.  Small and large bowel normal appearing.  No intra-abdominal evidence of disease.  Significant right sulcal tear noted after delivery of specimen through the vagina, repaired robotically as well as vaginally.   10/25/2019 Cancer Staging   Endometrial cancer, Stage IA, grade 1  endometrioid MI <50%, no LVSI, negative SLNs   05/01/2020 Relapse/Recurrence   A. VAGINAL CUFF, BIOPSY:  - Adenocarcinoma, see comment.   COMMENT:   The adenocarcinoma is similar in appearance to the patient's prior  endometrioid adenocarcinoma ([id number], reviewed).    05/05/2020 Imaging   CT C/A/P: No acute intra-abdominal or pelvic pathology.   05/28/2020 - 07/24/2020 Radiation Therapy    Radiation Treatment Dates: 05/28/2020 through 07/24/2020 Site Technique Total Dose (Gy) Dose per Fx (Gy) Completed Fx Beam Energies  Pelvis: Pelvis IMRT 45/45 1.8 25/25 6X  Vagina: Pelvis_Bst HDR-brachy 24/24 6 4/4 Ir-192        Interval History: Patient reports doing well.  She denies any abdominal or pelvic pain.  Reports baseline bowel bladder function.  Denies any vaginal bleeding or discharge.  Continues to use her vaginal dilator although somewhat less regularly.  Reports that shortness of breath and wheezing are at baseline, especially difficult during times of the year with temperature changes.  Has a lesion on her breast that began with redness and dry skin.  Hurts sometimes when clothing touches it.  She denies any drainage from this area.  Has been using Neosporin intermittently.  Last mammogram: 04/2022.  Past Medical/Surgical History: Past Medical History:  Diagnosis Date   Allergic rhinitis    Anemia    hematology, Dr. Julien Nordmann, last 01/2011   Asthma    Cervix cancer ([place])    CHF (congestive heart failure) ([place]) 2004   Chronic kidney disease    protein in urine   Complication of anesthesia    Dyslipidemia    Dyspnea    on excertion   Endometrial cancer ([place])    H/O echocardiogram  01/07/04   mild LVH, nl LV systolic function, EF 34%, left atrial enlargement, mildly thickened aortic and mitral valves   History of radiation therapy 8/4-21-07/24/20   Pelvis IMRT and HDR, Dr. Gery Pray   History of thyroid cancer    Hypertension    Incarcerated ventral hernia 10/07/2013    Obesity, Class III, BMI 40-49.9 (morbid obesity) ([place]) 10/09/2013   Osteopenia    PONV (postoperative nausea and vomiting)    Sleep apnea    Umbilical hernia    Unspecified essential hypertension 10/09/2013   Unspecified hypothyroidism 10/09/2013    Past Surgical History:  Procedure Laterality Date   BONE MARROW BIOPSY     CARDIAC CATHETERIZATION  2005   CHOLECYSTECTOMY  2003   COLONOSCOPY  2008   COLONOSCOPY  08/17/2022   ROBOTIC ASSISTED TOTAL HYSTERECTOMY WITH BILATERAL SALPINGO OOPHERECTOMY N/A 10/25/2019   Procedure: XI ROBOTIC ASSISTED TOTAL HYSTERECTOMY WITH BILATERAL SALPINGO OOPHORECTOMY;  Surgeon: Lafonda Mosses, MD;  Location: [facility];  Service: Gynecology;  Laterality: N/A;   SENTINEL NODE BIOPSY N/A 10/25/2019   Procedure: SENTINEL LYMPH NODE BIOPSY;  Surgeon: Lafonda Mosses, MD;  Location: [facility];  Service: Gynecology;  Laterality: N/A;   THYROID SURGERY     partial thyroidectomy   TOTAL THYROIDECTOMY  2009   VENTRAL HERNIA REPAIR N/A 10/07/2013   Procedure: HERNIA REPAIR VENTRAL ADULT;  Surgeon: Gwenyth Ober, MD;  Location: [facility];  Service: General;  Laterality: N/A;    Family History  Problem Relation Age of Onset   Hypertension Mother    Pancreatic cancer Mother    Lung cancer Father    Breast cancer Maternal Aunt    Ovarian cancer Neg Hx    Endometrial cancer Neg Hx    Colon cancer Neg Hx    Rectal cancer Neg Hx    Stomach cancer Neg Hx     Social History   Socioeconomic History   Marital status: Divorced    Spouse name: Not on file   Number of children: 1   Years of education: Not on file   Highest education level: Not on file  Occupational History   Not on file  Tobacco Use   Smoking status: Never   Smokeless tobacco: Never  Vaping Use   Vaping Use: Never used  Substance and Sexual Activity   Alcohol use: No   Drug use: No   Sexual activity: Not Currently  Other Topics Concern   Not on file  Social History Narrative   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[employer], is on her feet for 8-hour shifts   Social Determinants of Health   Financial Resource Strain: Medium Risk (03/03/2021)   Overall Financial Resource Strain (CARDIA)    Difficulty of Paying Living Expenses: Somewhat hard  Food Insecurity: Food Insecurity Present (03/03/2021)   Hunger Vital Sign    Worried About Running Out of Food in the Last Year: Never true    [place] in the Last Year: Sometimes true  Transportation Needs: No Transportation Needs (03/03/2021)   PRAPARE - Hydrologist (Medical): No    Lack of Transportation (Non-Medical): No  Physical Activity: Not on file  Stress: Not on file  Social Connections: Not on file    Current Medications:  Current Outpatient Medications:    albuterol (PROVENTIL HFA;VENTOLIN HFA) 108 (90 Base) MCG/ACT inhaler, Inhale 2 puffs into the lungs every 4 (four) hours as needed for wheezing or  shortness of breath., Disp: 1 Inhaler, Rfl: 0   aspirin EC 81 MG tablet, Take 81 mg by mouth daily., Disp: , Rfl:    Calcium Carbonate-Vitamin D (CALCIUM 600+D PO), Take 1 tablet by mouth 2 (two) times daily., Disp: , Rfl:    dapagliflozin propanediol (FARXIGA) 10 MG TABS tablet, Take 1 tablet (10 mg total) by mouth daily., Disp: 30 tablet, Rfl: 2   FeFum-FePoly-FA-B Cmp-C-Biot (INTEGRA PLUS) CAPS, Take 1 capsule by mouth daily., Disp: , Rfl:    fluticasone-salmeterol (ADVAIR) 250-50 MCG/ACT AEPB, Inhale 1 puff into the lungs in the morning and at bedtime., Disp: , Rfl:    levothyroxine (SYNTHROID) 125 MCG tablet, Take 150 mcg by mouth., Disp: , Rfl:    loratadine (CLARITIN) 10 MG tablet, Take 10 mg by mouth daily., Disp: , Rfl:    Nebivolol HCl 20 MG TABS, TAKE 1 TABLET BY MOUTH ONCE DAILY . APPOINTMENT REQUIRED FOR FUTURE REFILLS, Disp: 90 tablet, Rfl: 1   olopatadine (PATANOL) 0.1 % ophthalmic solution, Place 1 drop into both eyes 2 (two) times daily. , Disp: , Rfl:    rosuvastatin  (CRESTOR) 10 MG tablet, Take 10 mg by mouth at bedtime., Disp: , Rfl:    senna-docusate (SENOKOT-S) 8.6-50 MG tablet, Take 1 tablet by mouth 2 (two) times daily as needed for moderate constipation., Disp:  , Rfl:    sodium chloride (OCEAN) 0.65 % SOLN nasal spray, Place 1 spray into both nostrils as needed for congestion., Disp: , Rfl:    torsemide (DEMADEX) 20 MG tablet, Take 1 tablet (20 mg total) by mouth daily., Disp: 90 tablet, Rfl: 3  Review of Systems: Denies appetite changes, fevers, chills, fatigue, unexplained weight changes. Denies hearing loss, neck lumps or masses, mouth sores, ringing in ears or voice changes. Denies cough or wheezing.  Denies shortness of breath. Denies chest pain or palpitations. Denies leg swelling. Denies abdominal distention, pain, blood in stools, constipation, diarrhea, nausea, vomiting, or early satiety. Denies pain with intercourse, dysuria, frequency, hematuria or incontinence. Denies hot flashes, pelvic pain, vaginal bleeding or vaginal discharge.   Denies joint pain, back pain or muscle pain/cramps. Denies itching, rash, or wounds. Denies dizziness, headaches, numbness or seizures. Denies swollen lymph nodes or glands, denies easy bruising or bleeding. Denies anxiety, depression, confusion, or decreased concentration.  Physical Exam: BP (!) 145/48 (BP Location: Left Arm, Patient Position: Sitting)   Pulse (!) 56   Temp 98.9 F (37.2 C) (Oral)   Ht _0  (1.626 m)   Wt 250 lb 11.2 oz (113.7 kg)   SpO2 100%   BMI 43.03 kg/m  General: Alert, oriented, no acute distress. HEENT: Normocephalic, atraumatic, sclera anicteric. Chest: Unlabored breathing on room air. Cardiovascular: Regular rate and rhythm. Breast: Left breast there is an approximately 2 x 3 cm red macule, minimally raised, nontender to palpation. Abdomen: Obese, soft, nontender.  Normoactive bowel sounds.  No masses or hepatosplenomegaly appreciated.  Well-healed  incisions. Extremities: Grossly normal range of motion.  Warm, well perfused.  Trace edema bilaterally. Skin: No rashes or lesions noted. Lymphatics: No cervical, supraclavicular, or inguinal adenopathy. GU: Normal appearing external genitalia without erythema, excoriation, or lesions.  Speculum exam reveals continued small area of mild erythema along the anterior vaginal mucosa, previously biopsied.  Radiation changes noted.  Bimanual exam reveals cuff is smooth, no nodularity or masses, some narrowing of the upper vagina noted.  Rectovaginal exam confirms these findings.  Laboratory & Radiologic Studies: None new  Assessment & Plan:  Archer Vise Buss is a 66 y.o. woman with early stage uterine cancer who presented in July 2021 with vaginal bleeding found to have a recurrence at the vaginal cuff treated with RT completed in 06/2020.   Patient continues to do well.  She is NED on exam today.   There continues to be evidence of mild agglutination at the apex of the vagina.  I have encouraged her to continue using her vaginal dilator on a semiregular basis.   Patient is scheduled to see her primary care provider tomorrow.  Discussed area on her left breast.  This looks most consistent with a resolving boil or skin lesion.  I do not palpate a deeper lesion, but I think this area needs follow-up.  Patient's last mammogram was 5 months ago.  Patient is now more than 2 years out from completing treatment for her recurrence.  We discussed spacing surveillance visits to every 6 months at this point.  She is scheduled to see Dr. Sondra Come in 3 months we will plan to space visits out to every 6 months at that time.  She will call my office over the summer to schedule her next visit with me in September. We reviewed signs and symptoms that would be concerning for cancer recurrence and I stressed the importance of calling if she develops any of these before her next visit.   20 minutes of total time was spent  for this patient encounter, including preparation, face-to-face counseling with the patient and coordination of care, and documentation of the encounter.  Jeral Pinch, MD  Division of Gynecologic Oncology  Department of Obstetrics and Gynecology  [facility]

## 2022-10-28 DIAGNOSIS — I509 Heart failure, unspecified: Secondary | ICD-10-CM | POA: Diagnosis not present

## 2022-10-28 DIAGNOSIS — B079 Viral wart, unspecified: Secondary | ICD-10-CM | POA: Diagnosis not present

## 2022-10-28 DIAGNOSIS — C541 Malignant neoplasm of endometrium: Secondary | ICD-10-CM | POA: Diagnosis not present

## 2022-10-28 DIAGNOSIS — E870 Hyperosmolality and hypernatremia: Secondary | ICD-10-CM | POA: Diagnosis not present

## 2022-10-28 DIAGNOSIS — Z131 Encounter for screening for diabetes mellitus: Secondary | ICD-10-CM | POA: Diagnosis not present

## 2022-10-28 DIAGNOSIS — L988 Other specified disorders of the skin and subcutaneous tissue: Secondary | ICD-10-CM | POA: Diagnosis not present

## 2022-10-28 DIAGNOSIS — E039 Hypothyroidism, unspecified: Secondary | ICD-10-CM | POA: Diagnosis not present

## 2022-10-28 DIAGNOSIS — E785 Hyperlipidemia, unspecified: Secondary | ICD-10-CM | POA: Diagnosis not present

## 2022-11-10 DIAGNOSIS — R92322 Mammographic fibroglandular density, left breast: Secondary | ICD-10-CM | POA: Diagnosis not present

## 2022-11-10 DIAGNOSIS — R928 Other abnormal and inconclusive findings on diagnostic imaging of breast: Secondary | ICD-10-CM | POA: Diagnosis not present

## 2022-11-10 DIAGNOSIS — N6489 Other specified disorders of breast: Secondary | ICD-10-CM | POA: Diagnosis not present

## 2022-11-11 ENCOUNTER — Encounter: Payer: Self-pay | Admitting: Cardiology

## 2022-11-11 ENCOUNTER — Ambulatory Visit: Payer: BC Managed Care – PPO

## 2022-11-11 ENCOUNTER — Ambulatory Visit: Payer: BC Managed Care – PPO | Admitting: Cardiology

## 2022-11-11 DIAGNOSIS — I10 Essential (primary) hypertension: Secondary | ICD-10-CM | POA: Diagnosis not present

## 2022-11-11 DIAGNOSIS — I5032 Chronic diastolic (congestive) heart failure: Secondary | ICD-10-CM | POA: Diagnosis not present

## 2022-11-11 MED ORDER — SPIRONOLACTONE 25 MG PO TABS: 12.5000 mg | ORAL_TABLET | Freq: Every day | ORAL | 3 refills | Status: DC

## 2022-11-11 MED ORDER — NEBIVOLOL HCL 10 MG PO TABS: 10.0000 mg | ORAL_TABLET | Freq: Every day | ORAL | 1 refills | Status: DC

## 2022-11-11 NOTE — Progress Notes (Signed)
Follow up visit  Subjective:   Bridget Lamb, female    DOB: 12-30-1955, 67 y.o.   MRN: [id number]     HPI  Chief Complaint  Patient presents with   Congestive Heart Failure   Hypertension   Follow-up    3 MONTHS    67 y.o. Caucasian female with hypertension, hyperlipidemia, obesity, OSA, HFpEF, asthma, CKD stage 3a/b, h/o endometrial cancer  Patient has been seen by other providers in our practice before, last seen in 10.2023.  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  She reports unchanged mild exertional dyspnea and leg edema.  Blood pressure slightly with today, usually better than this.   Current Outpatient Medications:    albuterol (PROVENTIL HFA;VENTOLIN HFA) 108 (90 Base) MCG/ACT inhaler, Inhale 2 puffs into the lungs every 4 (four) hours as needed for wheezing or shortness of breath., Disp: 1 Inhaler, Rfl: 0   aspirin EC 81 MG tablet, Take 81 mg by mouth daily., Disp: , Rfl:    Calcium Carbonate-Vitamin D (CALCIUM 600+D PO), Take 1 tablet by mouth 2 (two) times daily., Disp: , Rfl:    dapagliflozin propanediol (FARXIGA) 10 MG TABS tablet, Take 1 tablet (10 mg total) by mouth daily., Disp: 30 tablet, Rfl: 2   fluticasone-salmeterol (ADVAIR) 250-50 MCG/ACT AEPB, Inhale 1 puff into the lungs in the morning and at bedtime., Disp: , Rfl:    levothyroxine (SYNTHROID) 125 MCG tablet, Take 150 mcg by mouth., Disp: , Rfl:    loratadine (CLARITIN) 10 MG tablet, Take 10 mg by mouth daily., Disp: , Rfl:    Nebivolol HCl 20 MG TABS, TAKE 1 TABLET BY MOUTH ONCE DAILY . APPOINTMENT REQUIRED FOR FUTURE REFILLS, Disp: 90 tablet, Rfl: 1   olopatadine (PATANOL) 0.1 % ophthalmic solution, Place 1 drop into both eyes 2 (two) times daily. , Disp: , Rfl:    rosuvastatin (CRESTOR) 10 MG tablet, Take 10 mg by mouth at bedtime., Disp: , Rfl:    senna-docusate (SENOKOT-S) 8.6-50 MG tablet, Take 1 tablet by mouth 2 (two) times daily as needed for moderate constipation.,  Disp:  , Rfl:    sodium chloride (OCEAN) 0.65 % SOLN nasal spray, Place 1 spray into both nostrils as needed for congestion., Disp: , Rfl:    torsemide (DEMADEX) 20 MG tablet, Take 1 tablet (20 mg total) by mouth daily., Disp: 90 tablet, Rfl: 3   FeFum-FePoly-FA-B Cmp-C-Biot (INTEGRA PLUS) CAPS, Take 1 capsule by mouth daily., Disp: , Rfl:    Cardiovascular & other pertient studies:  Reviewed external labs and tests, independently interpreted  EKG 08/12/2022: Sinus rhythm 61 bpm First degree AV block Incomplete RBBB Low voltage  Carotid artery duplex 08/12/2022:  Duplex suggests stenosis in the right internal carotid artery (50-69%). No  evidence of significant stenosis in the right external carotid artery.  Duplex suggests stenosis in the left internal carotid artery (minimal).  Antegrade right vertebral artery flow. Antegrade left vertebral artery  flow.  Compared to 01/13/2022, regression of stenosis left ICA. Follow up in six  months is appropriate if clinically indicated.   Upper extremity arterial duplex 01/13/2022:  No significant elevation of the peak systolic velocity is seen in the  right or left upper extremity to suggest a hemodynamically significant  stenosis. Normal triphasic waveform pattern noted.   Ambulatory cardiac telemetry 14 days (07/02/2021 - 07/15/2021): Predominant underlying rhythm was sinus with first-degree AV block.  4 episodes of supraventricular tachycardia, longest lasting 6 beats and  fastest 126 bpm.  Rare PACs and PVCs.  Patient's symptoms correlated with sinus rhythm.  No evidence of atrial fibrillation, high degree AV block, pauses >3 seconds, ventricular tachycardia.  Echocardiogram 03/01/2021: 1. Left ventricular ejection fraction, by estimation, is 55 to 60%. The  left ventricle has normal function. Left ventricular endocardial border  not optimally defined to evaluate regional wall motion. There is mild left  ventricular hypertrophy. Left   ventricular diastolic parameters are consistent with Grade II diastolic  dysfunction (pseudonormalization). Elevated left ventricular end-diastolic  pressure.   2. Right ventricular systolic function is normal. The right ventricular  size is mildly enlarged. Mildly increased right ventricular wall  thickness. There is moderately elevated pulmonary artery systolic  pressure.   3. Left atrial size was moderately dilated.   4. Right atrial size was mildly dilated.   5. The mitral valve is normal in structure. Mild to moderate mitral valve  regurgitation.   6. Tricuspid valve regurgitation is mild to moderate.   7. The aortic valve is normal in structure. Aortic valve regurgitation is  not visualized. No aortic stenosis is present.   8. The inferior vena cava is dilated in size with <50% respiratory  variability, suggesting right atrial pressure of 15 mmHg.   Lexiscan Sestamibi stress test 03/10/2020: No previous exam available for comparison. Lexiscan nuclear stress test performed using 1-day protocol. Stress EKG is non-diagnostic, as this is pharmacological stress test. Normal myocardial perfusion. Stress LVEF 59%. Low risk study.     Recent labs: 10/28/2022: Glucose 93, BUN/Cr 37/1.48. EGFR 39. Na/K 141/4.0. Rest of the CMP normal CBC NA HbA1C 5.4% Chol 136, TG 109, HDL 43, LDL 73 TSH 2.8 normal    Review of Systems  Cardiovascular:  Positive for dyspnea on exertion and leg swelling. Negative for chest pain, palpitations and syncope.         Vitals:   11/11/22 0925  BP: (!) 144/62  Pulse: 63  SpO2: 98%    Body mass index is 43.7 kg/m. Filed Weights   11/11/22 0925  Weight: 254 lb 9.6 oz (115.5 kg)     Objective:   Physical Exam Vitals and nursing note reviewed.  Constitutional:      General: She is not in acute distress. Neck:     Vascular: No JVD.  Cardiovascular:     Rate and Rhythm: Normal rate and regular rhythm.     Heart sounds: Normal heart  sounds. No murmur heard. Pulmonary:     Effort: Pulmonary effort is normal.     Breath sounds: Normal breath sounds. No wheezing or rales.  Musculoskeletal:     Right lower leg: Edema (1+) present.     Left lower leg: Edema (1+) present.             Visit diagnoses:   ICD-10-CM   1. Chronic heart failure with preserved ejection fraction (HCC)  J50.09 Basic metabolic panel    Pro b natriuretic peptide (BNP)[facility])    PCV ECHOCARDIOGRAM COMPLETE    2. Essential hypertension, benign  I10        Orders Placed This Encounter  Procedures   Basic metabolic panel   Pro b natriuretic peptide (BNP)[facility])   PCV ECHOCARDIOGRAM COMPLETE     Medication changes this visit: Medications Discontinued During This Encounter  Medication Reason   Nebivolol HCl 20 MG TABS Dose change    Meds ordered this encounter  Medications   spironolactone (ALDACTONE) 25 MG  tablet    Sig: Take 0.5 tablets (12.5 mg total) by mouth daily.    Dispense:  30 tablet    Refill:  3   nebivolol (BYSTOLIC) 10 MG tablet    Sig: Take 1 tablet (10 mg total) by mouth daily.    Dispense:  30 tablet    Refill:  1     Assessment & Recommendations:   67 y.o. Caucasian female with hypertension, hyperlipidemia, obesity, OSA, HFpEF, asthma, CKD stage 3a/b, h/o endometrial cancer  HFpEF: Mild fluid overload on exam with leg edema.  Last echocardiogram in 01/2021 showing grade 2 diastolic dysfunction. Ideally, would like to get her on spironolactone and wean her off beta-blocker. Today, I will start her spironolactone 12.5 mg daily.  Will reduce nebivolol to 10 mg daily, with goal of weaning off completely eventually. Check BMP, proBNP in 1 week. Check echocardiogram. Continue Farxiga, torsemide.   Hypertension: Generally well controlled.      Nigel Mormon, MD Pager: [phone] Office: [phone]

## 2022-11-17 ENCOUNTER — Ambulatory Visit: Payer: BC Managed Care – PPO

## 2022-11-17 DIAGNOSIS — I5032 Chronic diastolic (congestive) heart failure: Secondary | ICD-10-CM | POA: Diagnosis not present

## 2022-11-17 DIAGNOSIS — I10 Essential (primary) hypertension: Secondary | ICD-10-CM | POA: Diagnosis not present

## 2022-12-09 DIAGNOSIS — I5032 Chronic diastolic (congestive) heart failure: Secondary | ICD-10-CM | POA: Diagnosis not present

## 2022-12-10 LAB — BASIC METABOLIC PANEL
BUN/Creatinine Ratio: 23 (ref 12–28)
BUN: 35 mg/dL — ABNORMAL HIGH (ref 8–27)
CO2: 24 mmol/L (ref 20–29)
Calcium: 9 mg/dL (ref 8.7–10.3)
Chloride: 104 mmol/L (ref 96–106)
Creatinine, Ser: 1.52 mg/dL — ABNORMAL HIGH (ref 0.57–1.00)
Glucose: 93 mg/dL (ref 70–99)
Potassium: 4.9 mmol/L (ref 3.5–5.2)
Sodium: 146 mmol/L — ABNORMAL HIGH (ref 134–144)
eGFR: 37 mL/min/{1.73_m2} — ABNORMAL LOW (ref >59)

## 2022-12-10 LAB — PRO B NATRIURETIC PEPTIDE: NT-Pro BNP: 1133 pg/mL — ABNORMAL HIGH (ref 0–301)

## 2022-12-22 DIAGNOSIS — L720 Epidermal cyst: Secondary | ICD-10-CM | POA: Diagnosis not present

## 2022-12-22 DIAGNOSIS — B078 Other viral warts: Secondary | ICD-10-CM | POA: Diagnosis not present

## 2022-12-23 ENCOUNTER — Ambulatory Visit: Payer: BC Managed Care – PPO | Admitting: Cardiology

## 2022-12-23 ENCOUNTER — Encounter: Payer: Self-pay | Admitting: Cardiology

## 2022-12-23 VITALS — BP 134/55 | HR 58 | Resp 16 | Ht 64.0 in | Wt 251.0 lb

## 2022-12-23 DIAGNOSIS — I5032 Chronic diastolic (congestive) heart failure: Secondary | ICD-10-CM

## 2022-12-23 DIAGNOSIS — I10 Essential (primary) hypertension: Secondary | ICD-10-CM | POA: Diagnosis not present

## 2022-12-23 MED ORDER — SPIRONOLACTONE 25 MG PO TABS: 25.0000 mg | ORAL_TABLET | Freq: Every day | ORAL | 3 refills | Status: DC

## 2022-12-23 MED ORDER — TORSEMIDE 20 MG PO TABS: 20.0000 mg | ORAL_TABLET | ORAL | 3 refills | Status: DC

## 2022-12-23 NOTE — Progress Notes (Signed)
Follow up visit  Subjective:   Bridget Lamb, female    DOB: 08/06/1956, 67 y.o.   MRN: [id number]     HPI  Chief Complaint  Patient presents with   Congestive Heart Failure   Hypertension   Follow-up    6 week    67 y.o. Caucasian female with hypertension, hyperlipidemia, obesity, OSA, HFpEF, asthma, CKD stage 3a/b, h/o endometrial cancer  Patient continues to have close exertional dyspnea negative for symptoms.  She denies adding any salt to her food, but does admit to eating chips lately.  Reviewed recent test results with the patient, details below.     Current Outpatient Medications:    albuterol (PROVENTIL HFA;VENTOLIN HFA) 108 (90 Base) MCG/ACT inhaler, Inhale 2 puffs into the lungs every 4 (four) hours as needed for wheezing or shortness of breath., Disp: 1 Inhaler, Rfl: 0   aspirin EC 81 MG tablet, Take 81 mg by mouth daily., Disp: , Rfl:    Calcium Carbonate-Vitamin D (CALCIUM 600+D PO), Take 1 tablet by mouth 2 (two) times daily., Disp: , Rfl:    dapagliflozin propanediol (FARXIGA) 10 MG TABS tablet, Take 1 tablet (10 mg total) by mouth daily., Disp: 30 tablet, Rfl: 2   FeFum-FePoly-FA-B Cmp-C-Biot (INTEGRA PLUS) CAPS, Take 1 capsule by mouth daily., Disp: , Rfl:    fluticasone-salmeterol (ADVAIR) 250-50 MCG/ACT AEPB, Inhale 1 puff into the lungs in the morning and at bedtime., Disp: , Rfl:    levothyroxine (SYNTHROID) 125 MCG tablet, Take 150 mcg by mouth., Disp: , Rfl:    loratadine (CLARITIN) 10 MG tablet, Take 10 mg by mouth daily., Disp: , Rfl:    nebivolol (BYSTOLIC) 10 MG tablet, Take 1 tablet (10 mg total) by mouth daily., Disp: 30 tablet, Rfl: 1   olopatadine (PATANOL) 0.1 % ophthalmic solution, Place 1 drop into both eyes 2 (two) times daily. , Disp: , Rfl:    rosuvastatin (CRESTOR) 10 MG tablet, Take 10 mg by mouth at bedtime., Disp: , Rfl:    senna-docusate (SENOKOT-S) 8.6-50 MG tablet, Take 1 tablet by mouth 2 (two) times daily as needed for  moderate constipation., Disp:  , Rfl:    sodium chloride (OCEAN) 0.65 % SOLN nasal spray, Place 1 spray into both nostrils as needed for congestion., Disp: , Rfl:    spironolactone (ALDACTONE) 25 MG tablet, Take 0.5 tablets (12.5 mg total) by mouth daily., Disp: 30 tablet, Rfl: 3   torsemide (DEMADEX) 20 MG tablet, Take 1 tablet (20 mg total) by mouth daily., Disp: 90 tablet, Rfl: 3   Cardiovascular & other pertient studies:  Reviewed external labs and tests, independently interpreted  Echocardiogram 11/17/2022:  Poor echo window. Wall motion abnormality at reduced sensitivity.  Left ventricle cavity is normal in size. Normal left ventricular wall  thickness. Hypokinetic global wall motion. Doppler evidence of grade II  (pseudonormal) diastolic dysfunction, elevated LAP. Normal LV systolic  function with visual EF 55-60%.  Left atrial cavity is moderately dilated.  Structurally normal mitral valve.  Mild (Grade I) mitral regurgitation.  E-wave dominant mitral inflow.  Structurally normal tricuspid valve.  Mild tricuspid regurgitation.  Moderate pulmonary hypertension. RVSP measures 59 mmHg.  IVC is dilated with poor inspiration collapse consistent with elevated  right atrial pressure.  Compared to the study done on 03/01/2021, no significant change.   EKG 08/12/2022: Sinus rhythm 61 bpm First degree AV block Incomplete RBBB Low voltage  Carotid artery duplex 08/12/2022:  Duplex suggests stenosis in the right internal  carotid artery (50-69%). No  evidence of significant stenosis in the right external carotid artery.  Duplex suggests stenosis in the left internal carotid artery (minimal).  Antegrade right vertebral artery flow. Antegrade left vertebral artery  flow.  Compared to 01/13/2022, regression of stenosis left ICA. Follow up in six  months is appropriate if clinically indicated.   Upper extremity arterial duplex 01/13/2022:  No significant elevation of the peak systolic  velocity is seen in the  right or left upper extremity to suggest a hemodynamically significant  stenosis. Normal triphasic waveform pattern noted.   Ambulatory cardiac telemetry 14 days (07/02/2021 - 07/15/2021): Predominant underlying rhythm was sinus with first-degree AV block.  4 episodes of supraventricular tachycardia, longest lasting 6 beats and fastest 126 bpm.  Rare PACs and PVCs.  Patient's symptoms correlated with sinus rhythm.  No evidence of atrial fibrillation, high degree AV block, pauses >3 seconds, ventricular tachycardia.  Lexiscan Sestamibi stress test 03/10/2020: No previous exam available for comparison. Lexiscan nuclear stress test performed using 1-day protocol. Stress EKG is non-diagnostic, as this is pharmacological stress test. Normal myocardial perfusion. Stress LVEF 59%. Low risk study.     Recent labs: 10/28/2022: Glucose 93, BUN/Cr 37/1.48. EGFR 39. Na/K 141/4.0. Rest of the CMP normal CBC NA HbA1C 5.4% Chol 136, TG 109, HDL 43, LDL 73 TSH 2.8 normal    Review of Systems  Cardiovascular:  Positive for dyspnea on exertion and leg swelling. Negative for chest pain, palpitations and syncope.         Vitals:   12/23/22 1029  BP: (!) 134/55  Pulse: (!) 58  Resp: 16  SpO2: 96%    Body mass index is 43.08 kg/m. Filed Weights   12/23/22 1029  Weight: 251 lb (113.9 kg)     Objective:   Physical Exam Vitals and nursing note reviewed.  Constitutional:      General: She is not in acute distress. Neck:     Vascular: No JVD.  Cardiovascular:     Rate and Rhythm: Normal rate and regular rhythm.     Heart sounds: Normal heart sounds. No murmur heard. Pulmonary:     Effort: Pulmonary effort is normal.     Breath sounds: Normal breath sounds. No wheezing or rales.  Musculoskeletal:     Right lower leg: Edema (1+) present.     Left lower leg: Edema (1+) present.             Visit diagnoses: No diagnosis found.    Orders Placed This  Encounter  Procedures   Basic metabolic panel   Pro b natriuretic peptide (BNP)[id number])     Medication changes this visit: Medications Discontinued During This Encounter  Medication Reason   nebivolol (BYSTOLIC) 10 MG tablet Discontinued by provider   spironolactone (ALDACTONE) 25 MG tablet Reorder   torsemide (DEMADEX) 20 MG tablet Reorder    Meds ordered this encounter  Medications   spironolactone (ALDACTONE) 25 MG tablet    Sig: Take 1 tablet (25 mg total) by mouth daily.    Dispense:  30 tablet    Refill:  3   torsemide (DEMADEX) 20 MG tablet    Sig: Take 1 tablet (20 mg total) by mouth as directed. 1-2 pills daily as needed for leg edema    Dispense:  120 tablet    Refill:  3     Assessment & Recommendations:   67 y.o. Caucasian female with hypertension, hyperlipidemia, obesity, OSA, HFpEF, asthma, CKD stage  3a/b, h/o endometrial cancer  HFpEF: EF 55-60%, grade II DD, RVSP 54 mmHg Mild fluid overload on exam with leg edema.   Increase spironolactone to 25 mg daily. Avoid K risk foods such as banana, potatoes. Stop nebivolol. Continue Farxiga, torsemide (1-2 pills daily for le edema) Check BMP, proBNP in 1 week. Check daily weights. Avoid "hidden salt" in dietm incuding but not limited to chips.  Hypertension: Fairly well controlled.   F/u in 3-4 weeks     Nigel Mormon, MD Pager: [phone] Office: [phone]

## 2022-12-29 DIAGNOSIS — R0602 Shortness of breath: Secondary | ICD-10-CM | POA: Diagnosis not present

## 2022-12-29 DIAGNOSIS — L819 Disorder of pigmentation, unspecified: Secondary | ICD-10-CM | POA: Diagnosis not present

## 2022-12-29 DIAGNOSIS — I5032 Chronic diastolic (congestive) heart failure: Secondary | ICD-10-CM | POA: Diagnosis not present

## 2022-12-31 LAB — CMP14+EGFR
ALT: 14 IU/L (ref 0–32)
AST: 16 IU/L (ref 0–40)
Albumin/Globulin Ratio: 1.6 (ref 1.2–2.2)
Albumin: 4.4 g/dL (ref 3.9–4.9)
Alkaline Phosphatase: 110 IU/L (ref 44–121)
BUN/Creatinine Ratio: 26 (ref 12–28)
BUN: 47 mg/dL — ABNORMAL HIGH (ref 8–27)
Bilirubin Total: 0.4 mg/dL (ref 0.0–1.2)
CO2: 23 mmol/L (ref 20–29)
Calcium: 9.4 mg/dL (ref 8.7–10.3)
Chloride: 102 mmol/L (ref 96–106)
Creatinine, Ser: 1.82 mg/dL — ABNORMAL HIGH (ref 0.57–1.00)
Globulin, Total: 2.7 g/dL (ref 1.5–4.5)
Glucose: 93 mg/dL (ref 70–99)
Potassium: 5.6 mmol/L — ABNORMAL HIGH (ref 3.5–5.2)
Sodium: 141 mmol/L (ref 134–144)
Total Protein: 7.1 g/dL (ref 6.0–8.5)
eGFR: 30 mL/min/{1.73_m2} — ABNORMAL LOW (ref >59)

## 2022-12-31 LAB — CBC
Hematocrit: 33.2 % — ABNORMAL LOW (ref 34.0–46.6)
Hemoglobin: 10.6 g/dL — ABNORMAL LOW (ref 11.1–15.9)
MCH: 31.9 pg (ref 26.6–33.0)
MCHC: 31.9 g/dL (ref 31.5–35.7)
MCV: 100 fL — ABNORMAL HIGH (ref 79–97)
Platelets: 168 10*3/uL (ref 150–450)
RBC: 3.32 x10E6/uL — ABNORMAL LOW (ref 3.77–5.28)
RDW: 13.3 % (ref 11.7–15.4)
WBC: 5.8 10*3/uL (ref 3.4–10.8)

## 2022-12-31 LAB — ANA: ANA Titer 1: NEGATIVE

## 2022-12-31 LAB — BRAIN NATRIURETIC PEPTIDE: BNP: 69.6 pg/mL (ref 0.0–100.0)

## 2022-12-31 LAB — TSH: TSH: 2.44 u[IU]/mL (ref 0.450–4.500)

## 2023-01-17 NOTE — Progress Notes (Signed)
Maille Scheuring Voisin is here today for follow up post radiation to the pelvic.  They completed their radiation on: 07/24/20   Does the patient complain of any of the following:  Pain:*** Abdominal bloating: *** Diarrhea/Constipation: *** Nausea/Vomiting: *** Vaginal Discharge: *** Blood in Urine or Stool: *** Urinary Issues (dysuria/incomplete emptying/ incontinence/ increased frequency/urgency): *** Does patient report using vaginal dilator 2-3 times a week and/or sexually active 2-3 weeks: *** Post radiation skin changes: ***   Additional comments if applicable:

## 2023-01-19 NOTE — Progress Notes (Unsigned)
Follow up visit  Subjective:   Bridget Lamb, female    DOB: 1955-12-13, 67 y.o.   MRN: [id number]     HPI  No chief complaint on file.   67 y.o. Caucasian female with hypertension, hyperlipidemia, obesity, OSA, HFpEF, asthma, CKD stage 3a/b, h/o endometrial cancer  *** Patient continues to have close exertional dyspnea negative for symptoms.  She denies adding any salt to her food, but does admit to eating chips lately.  Reviewed recent test results with the patient, details below.     Current Outpatient Medications:    albuterol (PROVENTIL HFA;VENTOLIN HFA) 108 (90 Base) MCG/ACT inhaler, Inhale 2 puffs into the lungs every 4 (four) hours as needed for wheezing or shortness of breath., Disp: 1 Inhaler, Rfl: 0   aspirin EC 81 MG tablet, Take 81 mg by mouth daily., Disp: , Rfl:    Calcium Carbonate-Vitamin D (CALCIUM 600+D PO), Take 1 tablet by mouth 2 (two) times daily., Disp: , Rfl:    dapagliflozin propanediol (FARXIGA) 10 MG TABS tablet, Take 1 tablet (10 mg total) by mouth daily., Disp: 30 tablet, Rfl: 2   FeFum-FePoly-FA-B Cmp-C-Biot (INTEGRA PLUS) CAPS, Take 1 capsule by mouth daily., Disp: , Rfl:    fluticasone-salmeterol (ADVAIR) 250-50 MCG/ACT AEPB, Inhale 1 puff into the lungs in the morning and at bedtime., Disp: , Rfl:    levothyroxine (SYNTHROID) 125 MCG tablet, Take 150 mcg by mouth., Disp: , Rfl:    loratadine (CLARITIN) 10 MG tablet, Take 10 mg by mouth daily., Disp: , Rfl:    olopatadine (PATANOL) 0.1 % ophthalmic solution, Place 1 drop into both eyes 2 (two) times daily. , Disp: , Rfl:    rosuvastatin (CRESTOR) 10 MG tablet, Take 10 mg by mouth at bedtime., Disp: , Rfl:    senna-docusate (SENOKOT-S) 8.6-50 MG tablet, Take 1 tablet by mouth 2 (two) times daily as needed for moderate constipation., Disp:  , Rfl:    sodium chloride (OCEAN) 0.65 % SOLN nasal spray, Place 1 spray into both nostrils as needed for congestion., Disp: , Rfl:    spironolactone  (ALDACTONE) 25 MG tablet, Take 1 tablet (25 mg total) by mouth daily., Disp: 30 tablet, Rfl: 3   torsemide (DEMADEX) 20 MG tablet, Take 1 tablet (20 mg total) by mouth as directed. 1-2 pills daily as needed for leg edema, Disp: 120 tablet, Rfl: 3   Cardiovascular & other pertient studies:  Reviewed external labs and tests, independently interpreted  Echocardiogram 11/17/2022:  Poor echo window. Wall motion abnormality at reduced sensitivity.  Left ventricle cavity is normal in size. Normal left ventricular wall  thickness. Hypokinetic global wall motion. Doppler evidence of grade II  (pseudonormal) diastolic dysfunction, elevated LAP. Normal LV systolic  function with visual EF 55-60%.  Left atrial cavity is moderately dilated.  Structurally normal mitral valve.  Mild (Grade I) mitral regurgitation.  E-wave dominant mitral inflow.  Structurally normal tricuspid valve.  Mild tricuspid regurgitation.  Moderate pulmonary hypertension. RVSP measures 59 mmHg.  IVC is dilated with poor inspiration collapse consistent with elevated  right atrial pressure.  Compared to the study done on 03/01/2021, no significant change.   EKG 08/12/2022: Sinus rhythm 61 bpm First degree AV block Incomplete RBBB Low voltage  Carotid artery duplex 08/12/2022:  Duplex suggests stenosis in the right internal carotid artery (50-69%). No  evidence of significant stenosis in the right external carotid artery.  Duplex suggests stenosis in the left internal carotid artery (minimal).  Antegrade right  vertebral artery flow. Antegrade left vertebral artery  flow.  Compared to 01/13/2022, regression of stenosis left ICA. Follow up in six  months is appropriate if clinically indicated.   Upper extremity arterial duplex 01/13/2022:  No significant elevation of the peak systolic velocity is seen in the  right or left upper extremity to suggest a hemodynamically significant  stenosis. Normal triphasic waveform pattern  noted.   Ambulatory cardiac telemetry 14 days (07/02/2021 - 07/15/2021): Predominant underlying rhythm was sinus with first-degree AV block.  4 episodes of supraventricular tachycardia, longest lasting 6 beats and fastest 126 bpm.  Rare PACs and PVCs.  Patient's symptoms correlated with sinus rhythm.  No evidence of atrial fibrillation, high degree AV block, pauses >3 seconds, ventricular tachycardia.  Lexiscan Sestamibi stress test 03/10/2020: No previous exam available for comparison. Lexiscan nuclear stress test performed using 1-day protocol. Stress EKG is non-diagnostic, as this is pharmacological stress test. Normal myocardial perfusion. Stress LVEF 59%. Low risk study.     Recent labs: 12/29/2022: Glucose 93, BUN/Cr 47/1.82. EGFR 30. Na/K 141/3.6. Rest of the CMP normal H/H 10.6/33.2. MCV 100. Platelets 168 TSH 2.4 normal  10/28/2022: Glucose 93, BUN/Cr 37/1.48. EGFR 39. Na/K 141/4.0. Rest of the CMP normal CBC NA HbA1C 5.4% Chol 136, TG 109, HDL 43, LDL 73 TSH 2.8 normal    Review of Systems  Cardiovascular:  Positive for dyspnea on exertion and leg swelling. Negative for chest pain, palpitations and syncope.         There were no vitals filed for this visit.   There is no height or weight on file to calculate BMI. There were no vitals filed for this visit.    Objective:   Physical Exam Vitals and nursing note reviewed.  Constitutional:      General: She is not in acute distress. Neck:     Vascular: No JVD.  Cardiovascular:     Rate and Rhythm: Normal rate and regular rhythm.     Heart sounds: Normal heart sounds. No murmur heard. Pulmonary:     Effort: Pulmonary effort is normal.     Breath sounds: Normal breath sounds. No wheezing or rales.  Musculoskeletal:     Right lower leg: Edema (1+) present.     Left lower leg: Edema (1+) present.             Visit diagnoses: No diagnosis found.    No orders of the defined types were placed in this  encounter.    Medication changes this visit: There are no discontinued medications.   No orders of the defined types were placed in this encounter.    Assessment & Recommendations:   67 y.o. Caucasian female with hypertension, hyperlipidemia, obesity, OSA, HFpEF, asthma, CKD stage 3a/b, h/o endometrial cancer  HFpEF: EF 55-60%, grade II DD, RVSP 54 mmHg Mild fluid overload on exam with leg edema.    BUN/Cr 47/1.82. EGFR 30 (12/29/2022) after increasing spironolactone to 25 mg daily.  ***Avoid K risk foods such as banana, potatoes. Continue Farxiga, torsemide (1-2 pills daily for le edema) ***Check BMP, proBNP in 1 week. ***Check daily weights. Avoid "hidden salt" in dietm incuding but not limited to chips.  Hypertension: Fairly well controlled.   F/u in ***3-4 weeks     Nigel Mormon, MD Pager: [phone] Office: [phone]

## 2023-01-19 NOTE — Progress Notes (Signed)
Radiation Oncology         [phone] ________________________________  Name: Bridget Lamb MRN: [id number]  Date: 01/20/2023  DOB: May 03, 1956  Follow-Up Visit Note  CC: [name]  [name], PA-C  No diagnosis found.  Diagnosis: Recurrent FIGO stage IA (cT1a, cN0, cM0) endometrial adenocarcinoma, grade 1   Interval Since Last Radiation: 2 years, 5 months, and 29 days   Intent: Curative  Radiation Treatment Dates: 05/28/2020 through 07/24/2020 Site Technique Total Dose (Gy) Dose per Fx (Gy) Completed Fx Beam Energies  Pelvis: Pelvis IMRT 45/45 1.8 25/25 6X  Vagina: Pelvis_Bst HDR-brachy 24/24 6 4/4 Ir-192   Narrative:  The patient returns today for routine follow-up. She was last seen here for follow-up on 07/22/22. Since her last visit, the patient followed up with Dr. Berline Lopes on 10/22/22. During which time, the patient denied nay symptoms concerning for disease recurrence, and she was noted to be NED on examination. Evidence of mild agglutination was noted at the apex of the vagina, and Dr. Berline Lopes encouraged her to continue using her vaginal dilator on a semiregular basis. The also reported a lesion of her breast that started off with redness and dry skin. She also reported having some pain at the site when clothing would touch it. On examination, the breast lesion was noted to be most consistent with a resolving boil or skin lesion. Although no other palpable abnormalities were felt in either breast, Dr. Berline Lopes advised the patient to seek further evaluation.    Regarding her breast lesion, the patient followed up with her PCP on 10/28/22 and she was given Bactroban to apply to the area TID (under the assumption that her lesion was due to a skin infection).              Her PCP also arranged for a diagnostic left breast mammogram and left breast ultrasound on 11/10/22 which showed no evidence of malignancy in the left breast.   Other pertinent imaging performed  in the interval includes a DXA for bone mineral density on 09/23/22 which showed findings consistent with osteopenia.          ***     Allergies:  is allergic to procardia [nifedipine].  Meds: Current Outpatient Medications  Medication Sig Dispense Refill   albuterol (PROVENTIL HFA;VENTOLIN HFA) 108 (90 Base) MCG/ACT inhaler Inhale 2 puffs into the lungs every 4 (four) hours as needed for wheezing or shortness of breath. 1 Inhaler 0   aspirin EC 81 MG tablet Take 81 mg by mouth daily.     Calcium Carbonate-Vitamin D (CALCIUM 600+D PO) Take 1 tablet by mouth 2 (two) times daily.     dapagliflozin propanediol (FARXIGA) 10 MG TABS tablet Take 1 tablet (10 mg total) by mouth daily. 30 tablet 2   FeFum-FePoly-FA-B Cmp-C-Biot (INTEGRA PLUS) CAPS Take 1 capsule by mouth daily.     fluticasone-salmeterol (ADVAIR) 250-50 MCG/ACT AEPB Inhale 1 puff into the lungs in the morning and at bedtime.     levothyroxine (SYNTHROID) 125 MCG tablet Take 150 mcg by mouth.     loratadine (CLARITIN) 10 MG tablet Take 10 mg by mouth daily.     olopatadine (PATANOL) 0.1 % ophthalmic solution Place 1 drop into both eyes 2 (two) times daily.      rosuvastatin (CRESTOR) 10 MG tablet Take 10 mg by mouth at bedtime.     senna-docusate (SENOKOT-S) 8.6-50 MG tablet Take 1 tablet by mouth 2 (two) times daily as needed  for moderate constipation.     sodium chloride (OCEAN) 0.65 % SOLN nasal spray Place 1 spray into both nostrils as needed for congestion.     spironolactone (ALDACTONE) 25 MG tablet Take 1 tablet (25 mg total) by mouth daily. 30 tablet 3   torsemide (DEMADEX) 20 MG tablet Take 1 tablet (20 mg total) by mouth as directed. 1-2 pills daily as needed for leg edema 120 tablet 3   No current facility-administered medications for this encounter.    Physical Findings: The patient is in no acute distress. Patient is alert and oriented.  vitals were not taken for this visit. .  No significant changes. Lungs are clear  to auscultation bilaterally. Heart has regular rate and rhythm. No palpable cervical, supraclavicular, or axillary adenopathy. Abdomen soft, non-tender, normal bowel sounds.  On pelvic examination the external genitalia were unremarkable. A speculum exam was performed. There are no mucosal lesions noted in the vaginal vault. A Pap smear was obtained of the proximal vagina. On bimanual and rectovaginal examination there were no pelvic masses appreciated. ***   Lab Findings: Lab Results  Component Value Date   WBC 5.8 12/29/2022   HGB 10.6 (L) 12/29/2022   HCT 33.2 (L) 12/29/2022   MCV 100 (H) 12/29/2022   PLT 168 12/29/2022    Radiographic Findings: No results found.  Impression: Recurrent FIGO stage IA (cT1a, cN0, cM0) endometrial adenocarcinoma, grade 1   The patient is recovering from the effects of radiation.  ***  Plan:  ***   *** minutes of total time was spent for this patient encounter, including preparation, face-to-face counseling with the patient and coordination of care, physical exam, and documentation of the encounter. ____________________________________  Blair Promise, PhD, MD  This document serves as a record of services personally performed by Gery Pray, MD. It was created on his behalf by Roney Mans, a trained medical scribe. The creation of this record is based on the scribe's personal observations and the provider's statements to them. This document has been checked and approved by the attending provider.

## 2023-01-20 ENCOUNTER — Encounter: Payer: Self-pay | Admitting: Cardiology

## 2023-01-20 ENCOUNTER — Ambulatory Visit
Admission: RE | Admit: 2023-01-20 | Discharge: 2023-01-20 | Disposition: A | Discharge status: Home / Self Care | Payer: BC Managed Care – PPO | Source: Ambulatory Visit | Attending: Radiation Oncology | Admitting: Radiation Oncology

## 2023-01-20 ENCOUNTER — Ambulatory Visit: Payer: BC Managed Care – PPO | Admitting: Cardiology

## 2023-01-20 VITALS — BP 122/43 | HR 75 | Temp 96.8°F | Resp 20 | Ht 64.0 in | Wt 249.0 lb

## 2023-01-20 VITALS — BP 136/61 | HR 80 | Ht 64.0 in | Wt 251.0 lb

## 2023-01-20 DIAGNOSIS — I5032 Chronic diastolic (congestive) heart failure: Secondary | ICD-10-CM

## 2023-01-20 DIAGNOSIS — Z79899 Other long term (current) drug therapy: Secondary | ICD-10-CM | POA: Insufficient documentation

## 2023-01-20 DIAGNOSIS — Z7982 Long term (current) use of aspirin: Secondary | ICD-10-CM | POA: Insufficient documentation

## 2023-01-20 DIAGNOSIS — M8580 Other specified disorders of bone density and structure, unspecified site: Secondary | ICD-10-CM | POA: Diagnosis not present

## 2023-01-20 DIAGNOSIS — Z8542 Personal history of malignant neoplasm of other parts of uterus: Secondary | ICD-10-CM | POA: Insufficient documentation

## 2023-01-20 DIAGNOSIS — I10 Essential (primary) hypertension: Secondary | ICD-10-CM | POA: Diagnosis not present

## 2023-01-20 DIAGNOSIS — C541 Malignant neoplasm of endometrium: Secondary | ICD-10-CM | POA: Diagnosis not present

## 2023-01-20 MED ORDER — SPIRONOLACTONE 25 MG PO TABS: 12.5000 mg | ORAL_TABLET | Freq: Every day | ORAL | 0 refills | Status: DC

## 2023-01-27 DIAGNOSIS — N1832 Chronic kidney disease, stage 3b: Secondary | ICD-10-CM | POA: Diagnosis not present

## 2023-01-27 DIAGNOSIS — I10 Essential (primary) hypertension: Secondary | ICD-10-CM | POA: Diagnosis not present

## 2023-01-27 DIAGNOSIS — I700 Atherosclerosis of aorta: Secondary | ICD-10-CM | POA: Diagnosis not present

## 2023-01-27 DIAGNOSIS — Z6841 Body Mass Index (BMI) 40.0 and over, adult: Secondary | ICD-10-CM | POA: Diagnosis not present

## 2023-02-02 DIAGNOSIS — E875 Hyperkalemia: Secondary | ICD-10-CM | POA: Diagnosis not present

## 2023-02-17 DIAGNOSIS — H35033 Hypertensive retinopathy, bilateral: Secondary | ICD-10-CM | POA: Diagnosis not present

## 2023-02-17 DIAGNOSIS — D3131 Benign neoplasm of right choroid: Secondary | ICD-10-CM | POA: Diagnosis not present

## 2023-03-02 DIAGNOSIS — D649 Anemia, unspecified: Secondary | ICD-10-CM | POA: Diagnosis not present

## 2023-03-02 DIAGNOSIS — I129 Hypertensive chronic kidney disease with stage 1 through stage 4 chronic kidney disease, or unspecified chronic kidney disease: Secondary | ICD-10-CM | POA: Diagnosis not present

## 2023-03-02 DIAGNOSIS — R809 Proteinuria, unspecified: Secondary | ICD-10-CM | POA: Diagnosis not present

## 2023-03-02 DIAGNOSIS — N179 Acute kidney failure, unspecified: Secondary | ICD-10-CM | POA: Diagnosis not present

## 2023-03-02 DIAGNOSIS — N183 Chronic kidney disease, stage 3 unspecified: Secondary | ICD-10-CM | POA: Diagnosis not present

## 2023-03-30 ENCOUNTER — Encounter: Payer: Self-pay | Admitting: Cardiology

## 2023-03-30 ENCOUNTER — Ambulatory Visit: Payer: BC Managed Care – PPO | Admitting: Cardiology

## 2023-03-30 VITALS — BP 123/66 | HR 91 | Resp 16 | Ht 64.0 in | Wt 258.2 lb

## 2023-03-30 DIAGNOSIS — I5032 Chronic diastolic (congestive) heart failure: Secondary | ICD-10-CM

## 2023-03-30 DIAGNOSIS — I10 Essential (primary) hypertension: Secondary | ICD-10-CM

## 2023-03-30 NOTE — Progress Notes (Signed)
Follow up visit  Subjective:   Bridget Lamb, female    DOB: 05/09/1956, 67 y.o.   MRN: [id number]     HPI  Chief Complaint  Patient presents with   HFpEF   Follow-up    67 y.o. Caucasian female with hypertension, hyperlipidemia, obesity, OSA, HFpEF, asthma, CKD stage 3a/b, h/o endometrial cancer  Patient continues to have exertional dyspnea, class II.  Reviewed lab's from April, checked by PCP.  She is currently on spironolactone 12.5 mg daily.  She also follows up with [facility], with no recent change in her medications.   Current Outpatient Medications:    albuterol (PROVENTIL HFA;VENTOLIN HFA) 108 (90 Base) MCG/ACT inhaler, Inhale 2 puffs into the lungs every 4 (four) hours as needed for wheezing or shortness of breath., Disp: 1 Inhaler, Rfl: 0   aspirin EC 81 MG tablet, Take 81 mg by mouth daily., Disp: , Rfl:    Calcium Carbonate-Vitamin D (CALCIUM 600+D PO), Take 1 tablet by mouth 2 (two) times daily., Disp: , Rfl:    dapagliflozin propanediol (FARXIGA) 10 MG TABS tablet, Take 1 tablet (10 mg total) by mouth daily., Disp: 30 tablet, Rfl: 2   FeFum-FePoly-FA-B Cmp-C-Biot (INTEGRA PLUS) CAPS, Take 1 capsule by mouth daily., Disp: , Rfl:    fluticasone-salmeterol (ADVAIR) 250-50 MCG/ACT AEPB, Inhale 1 puff into the lungs in the morning and at bedtime., Disp: , Rfl:    levothyroxine (SYNTHROID) 125 MCG tablet, Take 150 mcg by mouth., Disp: , Rfl:    loratadine (CLARITIN) 10 MG tablet, Take 10 mg by mouth daily., Disp: , Rfl:    olopatadine (PATANOL) 0.1 % ophthalmic solution, Place 1 drop into both eyes 2 (two) times daily. , Disp: , Rfl:    rosuvastatin (CRESTOR) 10 MG tablet, Take 10 mg by mouth at bedtime., Disp: , Rfl:    senna-docusate (SENOKOT-S) 8.6-50 MG tablet, Take 1 tablet by mouth 2 (two) times daily as needed for moderate constipation., Disp:  , Rfl:    sodium chloride (OCEAN) 0.65 % SOLN nasal spray, Place 1 spray into both nostrils as  needed for congestion., Disp: , Rfl:    spironolactone (ALDACTONE) 25 MG tablet, Take 0.5 tablets (12.5 mg total) by mouth daily. (Patient taking differently: Take 25 mg by mouth daily.), Disp: 1 tablet, Rfl: 0   torsemide (DEMADEX) 20 MG tablet, Take 1 tablet (20 mg total) by mouth as directed. 1-2 pills daily as needed for leg edema, Disp: 120 tablet, Rfl: 3   valsartan (DIOVAN) 40 MG tablet, Take 1 tablet by mouth daily., Disp: , Rfl:    Cardiovascular & other pertient studies:  Reviewed external labs and tests, independently interpreted  Echocardiogram 11/17/2022:  Poor echo window. Wall motion abnormality at reduced sensitivity.  Left ventricle cavity is normal in size. Normal left ventricular wall  thickness. Hypokinetic global wall motion. Doppler evidence of grade II  (pseudonormal) diastolic dysfunction, elevated LAP. Normal LV systolic  function with visual EF 55-60%.  Left atrial cavity is moderately dilated.  Structurally normal mitral valve.  Mild (Grade I) mitral regurgitation.  E-wave dominant mitral inflow.  Structurally normal tricuspid valve.  Mild tricuspid regurgitation.  Moderate pulmonary hypertension. RVSP measures 59 mmHg.  IVC is dilated with poor inspiration collapse consistent with elevated  right atrial pressure.  Compared to the study done on 03/01/2021, no significant change.   EKG 08/12/2022: Sinus rhythm 61 bpm First degree AV block Incomplete RBBB Low voltage  Carotid artery duplex 08/12/2022:  Duplex suggests stenosis in the right internal carotid artery (50-69%). No  evidence of significant stenosis in the right external carotid artery.  Duplex suggests stenosis in the left internal carotid artery (minimal).  Antegrade right vertebral artery flow. Antegrade left vertebral artery  flow.  Compared to 01/13/2022, regression of stenosis left ICA. Follow up in six  months is appropriate if clinically indicated.   Upper extremity arterial duplex  01/13/2022:  No significant elevation of the peak systolic velocity is seen in the  right or left upper extremity to suggest a hemodynamically significant  stenosis. Normal triphasic waveform pattern noted.   Ambulatory cardiac telemetry 14 days (07/02/2021 - 07/15/2021): Predominant underlying rhythm was sinus with first-degree AV block.  4 episodes of supraventricular tachycardia, longest lasting 6 beats and fastest 126 bpm.  Rare PACs and PVCs.  Patient's symptoms correlated with sinus rhythm.  No evidence of atrial fibrillation, high degree AV block, pauses >3 seconds, ventricular tachycardia.  Lexiscan Sestamibi stress test 03/10/2020: No previous exam available for comparison. Lexiscan nuclear stress test performed using 1-day protocol. Stress EKG is non-diagnostic, as this is pharmacological stress test. Normal myocardial perfusion. Stress LVEF 59%. Low risk study.     Recent labs: 02/02/2023: Glucose 94, BUN/Cr 65/1.99. EGFR 27. Na/K 135/5.9.   01/27/2023: Glucose 100, BUN/Cr 55/1.78. EGFR 31. Na/K 140/6.3.   12/29/2022: Glucose 93, BUN/Cr 47/1.82. EGFR 30. Na/K 141/3.6. Rest of the CMP normal H/H 10.6/33.2. MCV 100. Platelets 168 TSH 2.4 normal  10/28/2022: Glucose 93, BUN/Cr 37/1.48. EGFR 39. Na/K 141/4.0. Rest of the CMP normal CBC NA HbA1C 5.4% Chol 136, TG 109, HDL 43, LDL 73 TSH 2.8 normal    Review of Systems  Cardiovascular:  Positive for dyspnea on exertion and leg swelling (Improving). Negative for chest pain, palpitations and syncope.         Vitals:   03/30/23 0826  BP: 123/66  Pulse: 91  Resp: 16  SpO2: 95%    Body mass index is 44.32 kg/m. Filed Weights   03/30/23 0826  Weight: 258 lb 3.2 oz (117.1 kg)     Objective:   Physical Exam Vitals and nursing note reviewed.  Constitutional:      General: She is not in acute distress. Neck:     Vascular: No JVD.  Cardiovascular:     Rate and Rhythm: Normal rate and regular rhythm.     Heart sounds:  Normal heart sounds. No murmur heard. Pulmonary:     Effort: Pulmonary effort is normal.     Breath sounds: Normal breath sounds. No wheezing or rales.  Musculoskeletal:     Right lower leg: Edema (Trace) present.     Left lower leg: Edema (Trace) present.          Visit diagnoses:   ICD-10-CM   1. Chronic heart failure with preserved ejection fraction (HCC)  I50.32 Basic metabolic panel    Pro b natriuretic peptide (BNP)[facility])    2. Essential hypertension, benign  I10        Orders Placed This Encounter  Procedures   Basic metabolic panel   Pro b natriuretic peptide (BNP)[facility])     Assessment & Recommendations:   67 y.o. Caucasian female with hypertension, hyperlipidemia, obesity, OSA, HFpEF, asthma, CKD stage 3a/b, h/o endometrial cancer  HFpEF: EF 55-60%, grade II DD, RVSP 54 mmHg. Continues to have NYHA class II symptoms. She has gained 7 pounds since her last visit. K was as high as  6.3, down to 5.9 in 01/2023, on spironolactone 12.5 mg daily, and Farxiga 10 mg daily. Recheck BMP and proBNP now.  If potassium remains >5.5, will need to stop spironolactone.  Also, if GFR is <30, we will have to reconsider usage of [place].  I will make changes to diuretic regimen based on above labs.  We may have to increase torsemide use, or add metolazone for further diuresis.  In addition, reemphasized importance of low-salt diet.    Hypertension: Fairly well controlled.   F/u in 6 weeks      Elder Negus, MD Pager: [phone] Office: [phone]

## 2023-03-31 LAB — BASIC METABOLIC PANEL
BUN/Creatinine Ratio: 30 — ABNORMAL HIGH (ref 12–28)
BUN: 50 mg/dL — ABNORMAL HIGH (ref 8–27)
CO2: 21 mmol/L (ref 20–29)
Calcium: 9.3 mg/dL (ref 8.7–10.3)
Chloride: 104 mmol/L (ref 96–106)
Creatinine, Ser: 1.67 mg/dL — ABNORMAL HIGH (ref 0.57–1.00)
Glucose: 97 mg/dL (ref 70–99)
Potassium: 5.6 mmol/L — ABNORMAL HIGH (ref 3.5–5.2)
Sodium: 140 mmol/L (ref 134–144)
eGFR: 33 mL/min/{1.73_m2} — ABNORMAL LOW (ref >59)

## 2023-03-31 LAB — PRO B NATRIURETIC PEPTIDE: NT-Pro BNP: 685 pg/mL — ABNORMAL HIGH (ref 0–301)

## 2023-03-31 MED ORDER — TORSEMIDE 20 MG PO TABS: 20.0000 mg | ORAL_TABLET | Freq: Two times a day (BID) | ORAL | 3 refills | Status: DC

## 2023-03-31 NOTE — Addendum Note (Signed)
Addended by: Elder Negus on: 03/31/2023 09:45 AM   Modules accepted: Orders

## 2023-04-01 NOTE — Progress Notes (Signed)
Called patient, NA, left results on VM.

## 2023-04-13 ENCOUNTER — Telehealth: Payer: Self-pay | Admitting: *Deleted

## 2023-04-13 NOTE — Telephone Encounter (Signed)
Spoke with Bridget Lamb after she left a voicemail for the office to schedule her follow up with Dr. Pricilla Holm in September. Patient was given an appt. On Thursday, September 12 at 2:15 with an arrival time for 2:00 for check in. Patient agreed to date and time and has no further concerns or questions at this time.

## 2023-05-11 ENCOUNTER — Ambulatory Visit: Payer: BC Managed Care – PPO | Admitting: Cardiology

## 2023-05-12 DIAGNOSIS — R92323 Mammographic fibroglandular density, bilateral breasts: Secondary | ICD-10-CM | POA: Diagnosis not present

## 2023-05-12 DIAGNOSIS — Z1231 Encounter for screening mammogram for malignant neoplasm of breast: Secondary | ICD-10-CM | POA: Diagnosis not present

## 2023-05-19 DIAGNOSIS — H2513 Age-related nuclear cataract, bilateral: Secondary | ICD-10-CM | POA: Diagnosis not present

## 2023-05-19 DIAGNOSIS — H2512 Age-related nuclear cataract, left eye: Secondary | ICD-10-CM | POA: Diagnosis not present

## 2023-05-19 DIAGNOSIS — H18413 Arcus senilis, bilateral: Secondary | ICD-10-CM | POA: Diagnosis not present

## 2023-05-19 DIAGNOSIS — H25043 Posterior subcapsular polar age-related cataract, bilateral: Secondary | ICD-10-CM | POA: Diagnosis not present

## 2023-05-19 DIAGNOSIS — H25013 Cortical age-related cataract, bilateral: Secondary | ICD-10-CM | POA: Diagnosis not present

## 2023-05-25 ENCOUNTER — Ambulatory Visit: Payer: BC Managed Care – PPO | Admitting: Cardiology

## 2023-06-16 ENCOUNTER — Ambulatory Visit: Payer: BC Managed Care – PPO | Admitting: Cardiology

## 2023-06-16 ENCOUNTER — Encounter: Payer: Self-pay | Admitting: Cardiology

## 2023-06-16 VITALS — BP 144/65 | HR 82 | Resp 16 | Ht 64.0 in | Wt 265.0 lb

## 2023-06-16 DIAGNOSIS — I5032 Chronic diastolic (congestive) heart failure: Secondary | ICD-10-CM | POA: Diagnosis not present

## 2023-06-16 DIAGNOSIS — I10 Essential (primary) hypertension: Secondary | ICD-10-CM

## 2023-06-16 MED ORDER — METOLAZONE 2.5 MG PO TABS: 2.5000 mg | ORAL_TABLET | ORAL | 3 refills | Status: DC

## 2023-06-16 NOTE — Progress Notes (Signed)
Follow up visit  Subjective:   Bridget Lamb, female    DOB: October 03, 1956, 67 y.o.   MRN: [id number]     HPI  Chief Complaint  Patient presents with   Chronic heart failure with preserved ejection fraction   Follow-up    6 week    67 y.o. Caucasian female with hypertension, hyperlipidemia, obesity, OSA, HFpEF, asthma, CKD stage 3a/b, h/o endometrial cancer  Patient continues to have exertional dyspnea, somewhat worse recently. Reviewed labs from 03/2023. She has not seen her nephrologist in several moths, sees them once a year.    Current Outpatient Medications:    albuterol (PROVENTIL HFA;VENTOLIN HFA) 108 (90 Base) MCG/ACT inhaler, Inhale 2 puffs into the lungs every 4 (four) hours as needed for wheezing or shortness of breath., Disp: 1 Inhaler, Rfl: 0   aspirin EC 81 MG tablet, Take 81 mg by mouth daily., Disp: , Rfl:    Calcium Carbonate-Vitamin D (CALCIUM 600+D PO), Take 1 tablet by mouth 2 (two) times daily., Disp: , Rfl:    dapagliflozin propanediol (FARXIGA) 10 MG TABS tablet, Take 1 tablet (10 mg total) by mouth daily., Disp: 30 tablet, Rfl: 2   FeFum-FePoly-FA-B Cmp-C-Biot (INTEGRA PLUS) CAPS, Take 1 capsule by mouth daily., Disp: , Rfl:    levothyroxine (SYNTHROID) 125 MCG tablet, Take 150 mcg by mouth., Disp: , Rfl:    loratadine (CLARITIN) 10 MG tablet, Take 10 mg by mouth daily., Disp: , Rfl:    olopatadine (PATANOL) 0.1 % ophthalmic solution, Place 1 drop into both eyes 2 (two) times daily. , Disp: , Rfl:    rosuvastatin (CRESTOR) 10 MG tablet, Take 10 mg by mouth at bedtime., Disp: , Rfl:    senna-docusate (SENOKOT-S) 8.6-50 MG tablet, Take 1 tablet by mouth 2 (two) times daily as needed for moderate constipation., Disp:  , Rfl:    sodium chloride (OCEAN) 0.65 % SOLN nasal spray, Place 1 spray into both nostrils as needed for congestion., Disp: , Rfl:    torsemide (DEMADEX) 20 MG tablet, Take 1 tablet (20 mg total) by mouth 2 (two) times daily. 1-2 pills daily  as needed for leg edema, Disp: 120 tablet, Rfl: 3   valsartan (DIOVAN) 40 MG tablet, Take 1 tablet by mouth daily., Disp: , Rfl:    Cardiovascular & other pertient studies:  Reviewed external labs and tests, independently interpreted  Echocardiogram 11/17/2022:  Poor echo window. Wall motion abnormality at reduced sensitivity.  Left ventricle cavity is normal in size. Normal left ventricular wall  thickness. Hypokinetic global wall motion. Doppler evidence of grade II  (pseudonormal) diastolic dysfunction, elevated LAP. Normal LV systolic  function with visual EF 55-60%.  Left atrial cavity is moderately dilated.  Structurally normal mitral valve.  Mild (Grade I) mitral regurgitation.  E-wave dominant mitral inflow.  Structurally normal tricuspid valve.  Mild tricuspid regurgitation.  Moderate pulmonary hypertension. RVSP measures 59 mmHg.  IVC is dilated with poor inspiration collapse consistent with elevated  right atrial pressure.  Compared to the study done on 03/01/2021, no significant change.   EKG 08/12/2022: Sinus rhythm 61 bpm First degree AV block Incomplete RBBB Low voltage  Carotid artery duplex 08/12/2022:  Duplex suggests stenosis in the right internal carotid artery (50-69%). No  evidence of significant stenosis in the right external carotid artery.  Duplex suggests stenosis in the left internal carotid artery (minimal).  Antegrade right vertebral artery flow. Antegrade left vertebral artery  flow.  Compared to 01/13/2022, regression of stenosis left  ICA. Follow up in six  months is appropriate if clinically indicated.   Upper extremity arterial duplex 01/13/2022:  No significant elevation of the peak systolic velocity is seen in the  right or left upper extremity to suggest a hemodynamically significant  stenosis. Normal triphasic waveform pattern noted.   Ambulatory cardiac telemetry 14 days (07/02/2021 - 07/15/2021): Predominant underlying rhythm was sinus with  first-degree AV block.  4 episodes of supraventricular tachycardia, longest lasting 6 beats and fastest 126 bpm.  Rare PACs and PVCs.  Patient's symptoms correlated with sinus rhythm.  No evidence of atrial fibrillation, high degree AV block, pauses >3 seconds, ventricular tachycardia.  Lexiscan Sestamibi stress test 03/10/2020: No previous exam available for comparison. Lexiscan nuclear stress test performed using 1-day protocol. Stress EKG is non-diagnostic, as this is pharmacological stress test. Normal myocardial perfusion. Stress LVEF 59%. Low risk study.     Recent labs: 03/30/2023: Glucose 97, BUN/Cr 50/1.67. EGFR 33. Na/K 140/5.6. Rest of the CMP normal NTproBNP 685  11/2022: NTproBNP 1133   Latest Reference Range & Units 12/09/22 08:23 12/29/22 08:39 03/30/23 09:35  Potassium 3.5 - 5.2 mmol/L 4.9 5.6 (H) 5.6 (H)  (H): Data is abnormally high  02/02/2023: Glucose 94, BUN/Cr 65/1.99. EGFR 27. Na/K 135/5.9.   10/28/2022: Glucose 93, BUN/Cr 37/1.48. EGFR 39. Na/K 141/4.0. Rest of the CMP normal CBC NA HbA1C 5.4% Chol 136, TG 109, HDL 43, LDL 73 TSH 2.8 normal    Review of Systems  Cardiovascular:  Positive for dyspnea on exertion and leg swelling (Improving). Negative for chest pain, palpitations and syncope.         Vitals:   06/16/23 0933  BP: (!) 144/65  Pulse: 82  Resp: 16  SpO2: 97%     Body mass index is 45.49 kg/m. Filed Weights   06/16/23 0933  Weight: 265 lb (120.2 kg)      Objective:   Physical Exam Vitals and nursing note reviewed.  Constitutional:      General: She is not in acute distress. Neck:     Vascular: No JVD.  Cardiovascular:     Rate and Rhythm: Normal rate and regular rhythm.     Heart sounds: Normal heart sounds. No murmur heard. Pulmonary:     Effort: Pulmonary effort is normal.     Breath sounds: Normal breath sounds. No wheezing or rales.  Musculoskeletal:     Right lower leg: Edema (Trace) present.     Left lower leg:  Edema (Trace) present.          Visit diagnoses:   ICD-10-CM   1. Chronic heart failure with preserved ejection fraction (HCC)  I50.32 metolazone (ZAROXOLYN) 2.5 MG tablet    Basic metabolic panel    Pro b natriuretic peptide (BNP)[facility])    2. Essential hypertension, benign  I10         Orders Placed This Encounter  Procedures   Basic metabolic panel   Pro b natriuretic peptide (BNP)[facility])     Assessment & Recommendations:   67 y.o. Caucasian female with hypertension, hyperlipidemia, obesity, OSA, HFpEF, asthma, CKD stage 3a/b, h/o endometrial cancer  HFpEF: EF 55-60%, grade II DD, RVSP 54 mmHg. Continues to have NYHA class II-III symptoms. She has again gained 7 pounds since her last visit. K has stayed >5.5 even after stopping spironolactone. Recommend stopping [place]. Continue torsemide 20 mg bid and added metolazone 2.5 mg every other day. Check BMP, proBNP in 1 week.  Hypertension: Fairly well controlled.   F/u in 2 weeks      Elder Negus, MD Pager: [phone] Office: [phone]

## 2023-06-22 DIAGNOSIS — I5032 Chronic diastolic (congestive) heart failure: Secondary | ICD-10-CM | POA: Diagnosis not present

## 2023-06-23 ENCOUNTER — Encounter: Payer: Self-pay | Admitting: Cardiology

## 2023-06-23 ENCOUNTER — Ambulatory Visit: Payer: BC Managed Care – PPO | Admitting: Cardiology

## 2023-06-23 VITALS — BP 122/72 | HR 85 | Resp 16 | Ht 64.0 in | Wt 260.0 lb

## 2023-06-23 DIAGNOSIS — I5032 Chronic diastolic (congestive) heart failure: Secondary | ICD-10-CM

## 2023-06-23 DIAGNOSIS — I10 Essential (primary) hypertension: Secondary | ICD-10-CM

## 2023-06-23 LAB — BASIC METABOLIC PANEL
BUN/Creatinine Ratio: 28 (ref 12–28)
BUN: 51 mg/dL — ABNORMAL HIGH (ref 8–27)
CO2: 26 mmol/L (ref 20–29)
Calcium: 9.3 mg/dL (ref 8.7–10.3)
Chloride: 97 mmol/L (ref 96–106)
Creatinine, Ser: 1.79 mg/dL — ABNORMAL HIGH (ref 0.57–1.00)
Glucose: 98 mg/dL (ref 70–99)
Potassium: 4.6 mmol/L (ref 3.5–5.2)
Sodium: 140 mmol/L (ref 134–144)
eGFR: 31 mL/min/{1.73_m2} — ABNORMAL LOW (ref >59)

## 2023-06-23 LAB — PRO B NATRIURETIC PEPTIDE: NT-Pro BNP: 379 pg/mL — ABNORMAL HIGH (ref 0–301)

## 2023-06-23 MED ORDER — METOLAZONE 2.5 MG PO TABS
2.5000 mg | ORAL_TABLET | ORAL | 3 refills | Status: DC
Start: 2023-06-23 — End: 2024-03-28

## 2023-06-23 MED ORDER — TORSEMIDE 20 MG PO TABS: 20.0000 mg | ORAL_TABLET | Freq: Two times a day (BID) | ORAL | 3 refills | Status: DC

## 2023-06-23 NOTE — Progress Notes (Signed)
Follow up visit  Subjective:   Bridget Lamb, female    DOB: 09/30/1956, 67 y.o.   MRN: [id number]     HPI  Chief Complaint  Patient presents with   Congestive Heart Failure   Follow-up    67 y.o. Caucasian female with hypertension, hyperlipidemia, obesity, OSA, HFpEF, asthma, CKD stage 3a/b, h/o endometrial cancer  Leg swelling has somewhat improved since last visit. She has lost some weight. She has had pain in the right lower back, worse with breathing. She denies hematuria, dysuria, fever, chills.    Current Outpatient Medications:    albuterol (PROVENTIL HFA;VENTOLIN HFA) 108 (90 Base) MCG/ACT inhaler, Inhale 2 puffs into the lungs every 4 (four) hours as needed for wheezing or shortness of breath., Disp: 1 Inhaler, Rfl: 0   aspirin EC 81 MG tablet, Take 81 mg by mouth daily., Disp: , Rfl:    Calcium Carbonate-Vitamin D (CALCIUM 600+D PO), Take 1 tablet by mouth 2 (two) times daily., Disp: , Rfl:    FeFum-FePoly-FA-B Cmp-C-Biot (INTEGRA PLUS) CAPS, Take 1 capsule by mouth daily., Disp: , Rfl:    levothyroxine (SYNTHROID) 125 MCG tablet, Take 150 mcg by mouth., Disp: , Rfl:    loratadine (CLARITIN) 10 MG tablet, Take 10 mg by mouth daily., Disp: , Rfl:    metolazone (ZAROXOLYN) 2.5 MG tablet, Take 1 tablet (2.5 mg total) by mouth as directed. Tuesday, Thursday, Saturday, Disp: 30 tablet, Rfl: 3   olopatadine (PATANOL) 0.1 % ophthalmic solution, Place 1 drop into both eyes 2 (two) times daily. , Disp: , Rfl:    rosuvastatin (CRESTOR) 10 MG tablet, Take 10 mg by mouth at bedtime., Disp: , Rfl:    senna-docusate (SENOKOT-S) 8.6-50 MG tablet, Take 1 tablet by mouth 2 (two) times daily as needed for moderate constipation., Disp:  , Rfl:    sodium chloride (OCEAN) 0.65 % SOLN nasal spray, Place 1 spray into both nostrils as needed for congestion., Disp: , Rfl:    torsemide (DEMADEX) 20 MG tablet, Take 1 tablet (20 mg total) by mouth 2 (two) times daily. 1-2 pills daily as  needed for leg edema, Disp: 120 tablet, Rfl: 3   valsartan (DIOVAN) 40 MG tablet, Take 1 tablet by mouth daily., Disp: , Rfl:    Cardiovascular & other pertient studies:  Reviewed external labs and tests, independently interpreted  Echocardiogram 11/17/2022:  Poor echo window. Wall motion abnormality at reduced sensitivity.  Left ventricle cavity is normal in size. Normal left ventricular wall  thickness. Hypokinetic global wall motion. Doppler evidence of grade II  (pseudonormal) diastolic dysfunction, elevated LAP. Normal LV systolic  function with visual EF 55-60%.  Left atrial cavity is moderately dilated.  Structurally normal mitral valve.  Mild (Grade I) mitral regurgitation.  E-wave dominant mitral inflow.  Structurally normal tricuspid valve.  Mild tricuspid regurgitation.  Moderate pulmonary hypertension. RVSP measures 59 mmHg.  IVC is dilated with poor inspiration collapse consistent with elevated  right atrial pressure.  Compared to the study done on 03/01/2021, no significant change.   EKG 08/12/2022: Sinus rhythm 61 bpm First degree AV block Incomplete RBBB Low voltage  Carotid artery duplex 08/12/2022:  Duplex suggests stenosis in the right internal carotid artery (50-69%). No  evidence of significant stenosis in the right external carotid artery.  Duplex suggests stenosis in the left internal carotid artery (minimal).  Antegrade right vertebral artery flow. Antegrade left vertebral artery  flow.  Compared to 01/13/2022, regression of stenosis left ICA. Follow up  in six  months is appropriate if clinically indicated.   Upper extremity arterial duplex 01/13/2022:  No significant elevation of the peak systolic velocity is seen in the  right or left upper extremity to suggest a hemodynamically significant  stenosis. Normal triphasic waveform pattern noted.   Ambulatory cardiac telemetry 14 days (07/02/2021 - 07/15/2021): Predominant underlying rhythm was sinus with  first-degree AV block.  4 episodes of supraventricular tachycardia, longest lasting 6 beats and fastest 126 bpm.  Rare PACs and PVCs.  Patient's symptoms correlated with sinus rhythm.  No evidence of atrial fibrillation, high degree AV block, pauses >3 seconds, ventricular tachycardia.  Lexiscan Sestamibi stress test 03/10/2020: No previous exam available for comparison. Lexiscan nuclear stress test performed using 1-day protocol. Stress EKG is non-diagnostic, as this is pharmacological stress test. Normal myocardial perfusion. Stress LVEF 59%. Low risk study.     Recent labs: 06/22/2023: Glucose 98, BUN/Cr 51/1.79. EGFR 31. Na/K 140/4.6.    Latest Reference Range & Units 12/09/22 08:23 03/30/23 09:35 06/22/23 08:37  NT-Pro BNP 0 - 301 pg/mL 1,133 (H) 685 (H) 379 (H)   03/30/2023: Glucose 97, BUN/Cr 50/1.67. EGFR 33. Na/K 140/5.6. Rest of the CMP normal NTproBNP 685  10/28/2022: Glucose 93, BUN/Cr 37/1.48. EGFR 39. Na/K 141/4.0. Rest of the CMP normal CBC NA HbA1C 5.4% Chol 136, TG 109, HDL 43, LDL 73 TSH 2.8 normal    Review of Systems  Cardiovascular:  Positive for dyspnea on exertion (Improved) and leg swelling (Improved). Negative for chest pain, palpitations and syncope.         There were no vitals filed for this visit.    There is no height or weight on file to calculate BMI. There were no vitals filed for this visit.     Objective:   Physical Exam Vitals and nursing note reviewed.  Constitutional:      General: She is not in acute distress. Neck:     Vascular: No JVD.  Cardiovascular:     Rate and Rhythm: Normal rate and regular rhythm.     Heart sounds: Normal heart sounds. No murmur heard. Pulmonary:     Effort: Pulmonary effort is normal.     Breath sounds: Normal breath sounds. No wheezing or rales.  Musculoskeletal:     Right lower leg: No edema.     Left lower leg: No edema.          Visit diagnoses:   ICD-10-CM   1. Essential  hypertension, benign  I10     2. Chronic heart failure with preserved ejection fraction (HCC)  I50.32 metolazone (ZAROXOLYN) 2.5 MG tablet       Assessment & Recommendations:   67 y.o. Caucasian female with hypertension, hyperlipidemia, obesity, OSA, HFpEF, asthma, CKD stage 3a/b, h/o endometrial cancer  HFpEF: EF 55-60%, grade II DD, RVSP 54 mmHg. Continues to have NYHA class II symptoms. 5 lb wt loss since last visit, improvement in leg edema. K improved.  Continue torsemide 20 mg bid and added metolazone 2.5 mg every other day. Right lower back pain likely musculoskeletal. Monitor for any signs of kidney stones such as hematuria, dysuria.  Hypertension: Well controlled.   F/u in 3 months     Elder Negus, MD Pager: [phone] Office: [phone]

## 2023-07-06 DIAGNOSIS — E039 Hypothyroidism, unspecified: Secondary | ICD-10-CM | POA: Diagnosis not present

## 2023-07-06 DIAGNOSIS — I10 Essential (primary) hypertension: Secondary | ICD-10-CM | POA: Diagnosis not present

## 2023-07-06 DIAGNOSIS — N1832 Chronic kidney disease, stage 3b: Secondary | ICD-10-CM | POA: Diagnosis not present

## 2023-07-06 DIAGNOSIS — E785 Hyperlipidemia, unspecified: Secondary | ICD-10-CM | POA: Diagnosis not present

## 2023-07-07 ENCOUNTER — Inpatient Hospital Stay: Payer: BC Managed Care – PPO

## 2023-07-07 ENCOUNTER — Other Ambulatory Visit (HOSPITAL_COMMUNITY): Payer: Self-pay

## 2023-07-07 ENCOUNTER — Encounter: Payer: Self-pay | Admitting: Gynecologic Oncology

## 2023-07-07 ENCOUNTER — Inpatient Hospital Stay: Payer: BC Managed Care – PPO | Attending: Gynecologic Oncology | Admitting: Gynecologic Oncology

## 2023-07-07 VITALS — BP 126/67 | HR 79 | Temp 98.9°F | Resp 16 | Wt 255.2 lb

## 2023-07-07 DIAGNOSIS — Z8542 Personal history of malignant neoplasm of other parts of uterus: Secondary | ICD-10-CM

## 2023-07-07 DIAGNOSIS — Z923 Personal history of irradiation: Secondary | ICD-10-CM | POA: Diagnosis not present

## 2023-07-07 DIAGNOSIS — Z90722 Acquired absence of ovaries, bilateral: Secondary | ICD-10-CM | POA: Diagnosis not present

## 2023-07-07 DIAGNOSIS — R300 Dysuria: Secondary | ICD-10-CM

## 2023-07-07 DIAGNOSIS — Z90710 Acquired absence of both cervix and uterus: Secondary | ICD-10-CM | POA: Insufficient documentation

## 2023-07-07 DIAGNOSIS — Z08 Encounter for follow-up examination after completed treatment for malignant neoplasm: Secondary | ICD-10-CM

## 2023-07-07 DIAGNOSIS — C541 Malignant neoplasm of endometrium: Secondary | ICD-10-CM

## 2023-07-07 DIAGNOSIS — R350 Frequency of micturition: Secondary | ICD-10-CM | POA: Diagnosis not present

## 2023-07-07 LAB — URINALYSIS, COMPLETE (UACMP) WITH MICROSCOPIC
Bilirubin Urine: NEGATIVE
Glucose, UA: NEGATIVE mg/dL
Ketones, ur: NEGATIVE mg/dL
Nitrite: NEGATIVE
Protein, ur: 100 mg/dL — AB
Specific Gravity, Urine: 1.012 (ref 1.005–1.030)
WBC, UA: >50 WBC/hpf (ref 0–5)
pH: 5 (ref 5.0–8.0)

## 2023-07-07 NOTE — Progress Notes (Signed)
Gynecologic Oncology Return Clinic Visit  07/07/23  Reason for Visit: surveillance in the setting of a history of endometrial cancer   Treatment History: Oncology History Overview Note  CA-125  10/12/19: 48.9  MSI stable   Endometrial cancer (HCC)  09/19/2019 Initial Diagnosis   Endometrial cancer (HCC)   09/19/2019 Initial Biopsy   EMB: EMCA with clear cell features   10/17/2019 Imaging   CT A/P: Mild endometrial thickening in this patient with known endometrial cancer. No evidence of metastatic disease in the abdomen/pelvis.  CXR 12/28: No evidence of acute cardiopulmonary abnormality.   10/24/2019 Cancer Staging   Staging form: Corpus Uteri - Carcinoma and Carcinosarcoma, AJCC 8th Edition - Clinical stage from 10/24/2019: FIGO Stage IA (cT1a, cN0(sn), cM0) - Signed by Carver Fila, MD on 05/01/2020   10/25/2019 Surgery   TRH/BSO, bil SLN, repair of sulcal tear Findings: On EUA, small cervix, moderately mobile uterus, no adnexal masses.  On intra-abdominal entry, adhesions from the omentum to the anterior wall at the patient's prior hernia repair as well as to the lateral anterior wall bilaterally.  Right aspect of the liver and diaphragm of secured by adhesions.  Left liver edge, diaphragm and stomach normal in appearance.  Uterus 10 cm in bulbous.  Posterior cul-de-sac completely obliterated and bilateral fallopian tubes and adnexa adherent to posterior uterus and sigmoid mesentery.  Fallopian tubes and left ovary somewhat concerning for involvement by tumor versus prior history of pelvic infection or endometriosis.  Mapping successful to bilateral external iliac sentinel lymph nodes.  Small and large bowel normal appearing.  No intra-abdominal evidence of disease.  Significant right sulcal tear noted after delivery of specimen through the vagina, repaired robotically as well as vaginally.   10/25/2019 Cancer Staging   Endometrial cancer, Stage IA, grade 1  endometrioid MI <50%, no LVSI, negative SLNs   05/01/2020 Relapse/Recurrence   A. VAGINAL CUFF, BIOPSY:  - Adenocarcinoma, see comment.   COMMENT:   The adenocarcinoma is similar in appearance to the patient's prior  endometrioid adenocarcinoma ([id number], reviewed).    05/05/2020 Imaging   CT C/A/P: No acute intra-abdominal or pelvic pathology.   05/28/2020 - 07/24/2020 Radiation Therapy    Radiation Treatment Dates: 05/28/2020 through 07/24/2020 Site Technique Total Dose (Gy) Dose per Fx (Gy) Completed Fx Beam Energies  Pelvis: Pelvis IMRT 45/45 1.8 25/25 6X  Vagina: Pelvis_Bst HDR-brachy 24/24 6 4/4 [id number]        Interval History: The patient reports overall doing well.  For the last several days, she notices some urinary frequency as well as some discomfort on the outside when she urinates.  Thinks this may be related to going so frequently and irritation from wiping.  Denies any vaginal bleeding or discharge.  Continues to use her vaginal dilator.  Reports baseline bowel function.  Denies any abdominal or pelvic pain.  Past Medical/Surgical History: Past Medical History:  Diagnosis Date   Allergic rhinitis    Anemia    hematology, Dr. Arbutus Ped, last 01/2011   Asthma    Cervix cancer (HCC)    CHF (congestive heart failure) (HCC) 2004   Chronic kidney disease    protein in urine   Complication of anesthesia    Dyslipidemia    Dyspnea    on excertion   Endometrial cancer (HCC)    H/O echocardiogram 01/07/04   mild LVH, nl LV systolic function, EF 60%, left atrial enlargement, mildly thickened aortic and mitral valves   History of radiation therapy 8/4-21-07/24/20  Pelvis IMRT and HDR, Dr. Antony Blackbird   History of thyroid cancer    Hypertension    Incarcerated ventral hernia 10/07/2013   Obesity, Class III, BMI 40-49.9 (morbid obesity) (HCC) 10/09/2013   Osteopenia    PONV (postoperative nausea and vomiting)    Sleep apnea    Umbilical hernia    Unspecified essential  hypertension 10/09/2013   Unspecified hypothyroidism 10/09/2013    Past Surgical History:  Procedure Laterality Date   BONE MARROW BIOPSY     CARDIAC CATHETERIZATION  2005   CHOLECYSTECTOMY  2003   COLONOSCOPY  2008   COLONOSCOPY  08/17/2022   ROBOTIC ASSISTED TOTAL HYSTERECTOMY WITH BILATERAL SALPINGO OOPHERECTOMY N/A 10/25/2019   Procedure: XI ROBOTIC ASSISTED TOTAL HYSTERECTOMY WITH BILATERAL SALPINGO OOPHORECTOMY;  Surgeon: Carver Fila, MD;  Location: [facility];  Service: Gynecology;  Laterality: N/A;   SENTINEL NODE BIOPSY N/A 10/25/2019   Procedure: SENTINEL LYMPH NODE BIOPSY;  Surgeon: Carver Fila, MD;  Location: [facility];  Service: Gynecology;  Laterality: N/A;   THYROID SURGERY     partial thyroidectomy   TOTAL THYROIDECTOMY  2009   VENTRAL HERNIA REPAIR N/A 10/07/2013   Procedure: HERNIA REPAIR VENTRAL ADULT;  Surgeon: Cherylynn Ridges, MD;  Location: [facility] OR;  Service: General;  Laterality: N/A;    Family History  Problem Relation Age of Onset   Hypertension Mother    Pancreatic cancer Mother    Lung cancer Father    Breast cancer Maternal Aunt    Ovarian cancer Neg Hx    Endometrial cancer Neg Hx    Colon cancer Neg Hx    Rectal cancer Neg Hx    Stomach cancer Neg Hx     Social History   Socioeconomic History   Marital status: Divorced    Spouse name: Not on file   Number of children: 1   Years of education: Not on file   Highest education level: Not on file  Occupational History   Not on file  Tobacco Use   Smoking status: Never   Smokeless tobacco: Never  Vaping Use   Vaping status: Never Used  Substance and Sexual Activity   Alcohol use: No   Drug use: No   Sexual activity: Not Currently  Other Topics Concern   Not on file  Social History Narrative   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[employer], is on her feet for 8-hour shifts   Social Determinants of Health   Financial Resource Strain: Low Risk  (10/28/2022)   Received from [facility], [facility]   Overall Financial Resource Strain (CARDIA)    Difficulty of Paying Living Expenses: Not hard at all  Food Insecurity: No Food Insecurity (10/28/2022)   Received from [facility], [facility]   Hunger Vital Sign    Worried About Running Out of Food in the Last Year: Never true    Ran Out of Food in the Last Year: Never true  Transportation Needs: No Transportation Needs (10/28/2022)   Received from [facility], [facility]   PRAPARE - Transportation    Lack of Transportation (Medical): No    Lack of Transportation (Non-Medical): No  Physical Activity: Not on file  Stress: Not on file  Social Connections: Unknown (03/03/2022)   [facility] from [facility], [facility]   Social Network    Social Network: Not on file    Current Medications:  Current Outpatient Medications:    albuterol (PROVENTIL HFA;VENTOLIN HFA) 108 (90 Base)  MCG/ACT inhaler, Inhale 2 puffs into the lungs every 4 (four) hours as needed for wheezing or shortness of breath., Disp: 1 Inhaler, Rfl: 0   Calcium Carbonate-Vitamin D (CALCIUM 600+D PO), Take 1 tablet by mouth 2 (two) times daily., Disp: , Rfl:    FeFum-FePoly-FA-B Cmp-C-Biot (INTEGRA PLUS) CAPS, Take 1 capsule by mouth daily., Disp: , Rfl:    levothyroxine (SYNTHROID) 125 MCG tablet, Take 150 mcg by mouth., Disp: , Rfl:    loratadine (CLARITIN) 10 MG tablet, Take 10 mg by mouth daily., Disp: , Rfl:    metolazone (ZAROXOLYN) 2.5 MG tablet, Take 1 tablet (2.5 mg total) by mouth as directed. Tuesday, Thursday, Saturday, Disp: 90 tablet, Rfl: 3   olopatadine (PATANOL) 0.1 % ophthalmic solution, Place 1 drop into both eyes 2 (two) times daily. , Disp: , Rfl:    rosuvastatin (CRESTOR) 10 MG tablet, Take 10 mg by mouth at bedtime., Disp: , Rfl:    senna-docusate (SENOKOT-S) 8.6-50 MG tablet, Take 1 tablet by mouth 2 (two) times daily as needed for moderate constipation., Disp:  , Rfl:    sodium chloride (OCEAN) 0.65 % SOLN nasal spray,  Place 1 spray into both nostrils as needed for congestion., Disp: , Rfl:    torsemide (DEMADEX) 20 MG tablet, Take 1 tablet (20 mg total) by mouth 2 (two) times daily. 1-2 pills daily as needed for leg edema, Disp: 180 tablet, Rfl: 3   valsartan (DIOVAN) 40 MG tablet, Take 1 tablet by mouth daily., Disp: , Rfl:   Review of Systems: + shortness of breath, urinary frequency, easy bruising/bleeding Denies appetite changes, fevers, chills, fatigue, unexplained weight changes. Denies hearing loss, neck lumps or masses, mouth sores, ringing in ears or voice changes. Denies cough or wheezing.  Denies chest pain or palpitations. Denies leg swelling. Denies abdominal distention, pain, blood in stools, constipation, diarrhea, nausea, vomiting, or early satiety. Denies pain with intercourse, dysuria, hematuria or incontinence. Denies hot flashes, pelvic pain, vaginal bleeding or vaginal discharge.   Denies joint pain, back pain or muscle pain/cramps. Denies itching, rash, or wounds. Denies dizziness, headaches, numbness or seizures. Denies swollen lymph nodes or glands. Denies anxiety, depression, confusion, or decreased concentration.  Physical Exam: BP 126/67 (BP Location: Right Arm, Patient Position: Sitting)   Pulse 79   Temp 98.9 F (37.2 C) (Oral)   Resp 16   Wt 255 lb 3.2 oz (115.8 kg)   SpO2 97%   BMI 43.80 kg/m  General: Alert, oriented, no acute distress. HEENT: Normocephalic, atraumatic, sclera anicteric. Chest: Unlabored breathing on room air. Cardiovascular: Regular rate and rhythm. Breast: Left breast there is an approximately 2 x 3 cm red macule, minimally raised, nontender to palpation. Abdomen: Obese, soft, nontender.  Normoactive bowel sounds.  No masses or hepatosplenomegaly appreciated.  Well-healed incisions. Extremities: Grossly normal range of motion.  Warm, well perfused.  Trace edema bilaterally. Skin: No rashes or lesions noted. Lymphatics: No cervical,  supraclavicular, or inguinal adenopathy. GU: Normal appearing external genitalia without erythema, excoriation, or lesions.  Speculum exam reveals continued small area of mild erythema (no change in size) along the anterior vaginal mucosa, previously biopsied.  Radiation changes noted.  Bimanual exam reveals cuff is smooth, no nodularity or masses, some narrowing of the upper vagina noted.  Rectovaginal exam confirms these findings.  Laboratory & Radiologic Studies: None new  Assessment & Plan: Bridget Lamb is a 67 y.o. woman with early stage uterine cancer who presented in July 2021 with vaginal bleeding  found to have a recurrence at the vaginal cuff treated with RT completed in 06/2020.   Patient continues to do well.  She is NED on exam today.   There continues to be evidence of mild agglutination at the apex of the vagina.  I have encouraged her to continue using her vaginal dilator on a semiregular basis.  Given urinary symptoms, discussed getting her analysis and culture today.  I do not see anything physically on exam that would explain her symptoms (no obvious irritation or lesions).  Discussed using a squirt bottle to rinse off the urine after she urinates and using something like Vaseline or Aquaphor as a barrier.   Patient is now 3 years out from completing treatment for her recurrence. We will continue surveillance visits every 6 months alternating between my office and Dr. Roselind Messier.  She will call my office over the summer nest year to schedule her next visit with me in September. We reviewed signs and symptoms that would be concerning for cancer recurrence and I stressed the importance of calling if she develops any of these before her next visit.   22 minutes of total time was spent for this patient encounter, including preparation, face-to-face counseling with the patient and coordination of care, and documentation of the encounter.  Eugene Garnet, MD  Division of  Gynecologic Oncology  Department of Obstetrics and Gynecology  [facility]

## 2023-07-07 NOTE — Patient Instructions (Signed)
It was good to see you today.  I do not see or feel any evidence of cancer recurrence on your exam.  Please call my office next summer to schedule follow-up.  I will see you for follow-up in a year.  My schedule for the fall will be out in May or June 2025.  As always, if you develop any new and concerning symptoms before your next visit, please call to see me sooner.

## 2023-07-09 ENCOUNTER — Other Ambulatory Visit: Payer: Self-pay | Admitting: Gynecologic Oncology

## 2023-07-09 DIAGNOSIS — N3000 Acute cystitis without hematuria: Secondary | ICD-10-CM

## 2023-07-09 LAB — URINE CULTURE: Culture: >=100000 — AB

## 2023-07-09 MED ORDER — SULFAMETHOXAZOLE-TRIMETHOPRIM 800-160 MG PO TABS
1.0000 | ORAL_TABLET | Freq: Two times a day (BID) | ORAL | 0 refills | Status: AC
Start: 2023-07-09 — End: 2023-07-14

## 2023-07-09 NOTE — Progress Notes (Signed)
Please add her to the list to call later next week to see if symptoms better with antibiotics. Thank you

## 2023-07-09 NOTE — Progress Notes (Signed)
UTI confirmed on culture. Given CKD, discussed with pharmacist best choice. Recommendation was Bactrim x 5 days or Cipro x3. Given poor kidney penetration, cefdinir not recommended. Called patient with update about UTI. Abx sent to her pharmacy on file.  Eugene Garnet MD Gynecologic Oncology

## 2023-07-13 ENCOUNTER — Telehealth: Payer: Self-pay | Admitting: *Deleted

## 2023-07-13 NOTE — Telephone Encounter (Signed)
Spoke with Bridget Lamb who states her symptoms are better, and no more irritation.  Pt states she was having like a small electric shock type pain when urinating but since taking the antibiotics it is gone. Pt denies fever, chills, urgency, frequency, discharge and odor. Pt states she has two more days left on antibiotics, encouraged her to continue to take them until gone. Pt verbalized understanding and advised to call the office with any concerns or questions.

## 2023-07-13 NOTE — Telephone Encounter (Signed)
-----   Message from Carver Fila sent at 07/09/2023  5:23 PM EDT ----- Please add her to the list to call later next week to see if symptoms better with antibiotics. Thank you

## 2023-07-20 DIAGNOSIS — H2512 Age-related nuclear cataract, left eye: Secondary | ICD-10-CM | POA: Diagnosis not present

## 2023-07-21 DIAGNOSIS — H2511 Age-related nuclear cataract, right eye: Secondary | ICD-10-CM | POA: Diagnosis not present

## 2023-07-21 DIAGNOSIS — H25041 Posterior subcapsular polar age-related cataract, right eye: Secondary | ICD-10-CM | POA: Diagnosis not present

## 2023-07-21 DIAGNOSIS — H25011 Cortical age-related cataract, right eye: Secondary | ICD-10-CM | POA: Diagnosis not present

## 2023-09-02 ENCOUNTER — Encounter (HOSPITAL_COMMUNITY): Payer: Self-pay

## 2023-09-02 ENCOUNTER — Ambulatory Visit (HOSPITAL_COMMUNITY): Payer: BC Managed Care – PPO

## 2023-09-02 ENCOUNTER — Ambulatory Visit (HOSPITAL_COMMUNITY)
Admission: EM | Admit: 2023-09-02 | Discharge: 2023-09-02 | Disposition: A | Discharge status: Home / Self Care | Payer: BC Managed Care – PPO | Attending: Internal Medicine | Admitting: Internal Medicine

## 2023-09-02 DIAGNOSIS — M109 Gout, unspecified: Secondary | ICD-10-CM

## 2023-09-02 MED ORDER — PREDNISONE 10 MG (21) PO TBPK: ORAL_TABLET | ORAL | 0 refills | Status: DC

## 2023-09-02 MED ORDER — PREDNISONE 20 MG PO TABS
60.0000 mg | ORAL_TABLET | Freq: Once | ORAL | Status: AC
  Administered 2023-09-02: 60 mg via ORAL

## 2023-09-02 MED ORDER — PREDNISONE 20 MG PO TABS
ORAL_TABLET | ORAL | Status: AC
  Filled 2023-09-02: qty 3

## 2023-09-02 NOTE — ED Provider Notes (Signed)
[facility]    CSN: [id number] Arrival date & time: 09/02/23  1626      History   Chief Complaint Chief Complaint  Patient presents with   Ankle Pain   Joint Swelling    HPI Bridget Lamb is a 67 y.o. female.   The history is provided by the patient.  Ankle Pain Associated symptoms: no fever   And swelling left ankle and left foot gradual onset 2 days ago getting worse.  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  Does not recall an injury.  Admits redness, warmth, stiffness.  Denies fever, chills, sweats, weakness, paresthesias. Past medical history includes congestive heart failure, hypertension, asthma, endometrial cancer, currently cancer free  Past Medical History:  Diagnosis Date   Allergic rhinitis    Anemia    hematology, Dr. Arbutus Ped, last 01/2011   Asthma    Cervix cancer (HCC)    CHF (congestive heart failure) (HCC) 2004   Chronic kidney disease    protein in urine   Complication of anesthesia    Dyslipidemia    Dyspnea    on excertion   Endometrial cancer (HCC)    H/O echocardiogram 01/07/04   mild LVH, nl LV systolic function, EF 60%, left atrial enlargement, mildly thickened aortic and mitral valves   History of radiation therapy 8/4-21-07/24/20   Pelvis IMRT and HDR, Dr. Antony Blackbird   History of thyroid cancer    Hypertension    Incarcerated ventral hernia 10/07/2013   Obesity, Class III, BMI 40-49.9 (morbid obesity) (HCC) 10/09/2013   Osteopenia    PONV (postoperative nausea and vomiting)    Sleep apnea    Umbilical hernia    Unspecified essential hypertension 10/09/2013   Unspecified hypothyroidism 10/09/2013    Patient Active Problem List   Diagnosis Date Noted   Acute on chronic congestive heart failure (HCC)    AKI (acute kidney injury) (HCC)    Benign hypertension with CKD (chronic kidney disease) stage III (HCC)    Mixed hyperlipidemia    Atherosclerosis of  aorta (HCC)    Hypoxia    Acute on chronic diastolic (congestive) heart failure (HCC) 02/27/2021   Vaginal lesion 05/01/2020   OSA (obstructive sleep apnea) 03/07/2020   Abnormal findings on diagnostic imaging of lung 03/07/2020   Acute pulmonary edema (HCC) 02/20/2020   Hypertensive urgency    Dyspnea    Endometrial cancer (HCC) 10/12/2019   Bilateral leg edema 12/20/2018   Chronic heart failure with preserved ejection fraction (HCC) 12/20/2018   Asthma, chronic, unspecified asthma severity, with acute exacerbation 08/29/2018   Acute respiratory failure with hypoxia and hypercapnia (HCC) 08/29/2018   Iron deficiency anemia 02/04/2015   Chronic diastolic CHF (congestive heart failure) (HCC) 12/24/2013   Obesity, Class III, BMI 40-49.9 (morbid obesity) (HCC) 10/09/2013   Essential hypertension, benign 01/31/2012   Dyslipidemia 01/31/2012   Hypothyroidism 01/31/2012   Anemia 01/31/2012    Past Surgical History:  Procedure Laterality Date   BONE MARROW BIOPSY     CARDIAC CATHETERIZATION  2005   CHOLECYSTECTOMY  2003   COLONOSCOPY  2008   COLONOSCOPY  08/17/2022   ROBOTIC ASSISTED TOTAL HYSTERECTOMY WITH BILATERAL SALPINGO OOPHERECTOMY N/A 10/25/2019   Procedure: XI ROBOTIC ASSISTED TOTAL HYSTERECTOMY WITH BILATERAL SALPINGO OOPHORECTOMY;  Surgeon: Carver Fila, MD;  Location: [facility];  Service: Gynecology;  Laterality: N/A;   SENTINEL NODE BIOPSY N/A 10/25/2019   Procedure: SENTINEL LYMPH  NODE BIOPSY;  Surgeon: Carver Fila, MD;  Location: [facility];  Service: Gynecology;  Laterality: N/A;   THYROID SURGERY     partial thyroidectomy   TOTAL THYROIDECTOMY  2009   VENTRAL HERNIA REPAIR N/A 10/07/2013   Procedure: HERNIA REPAIR VENTRAL ADULT;  Surgeon: Cherylynn Ridges, MD;  Location: [facility];  Service: General;  Laterality: N/A;    OB History     Gravida  3   Para  1   Term      Preterm      AB  2   Living  1      SAB      IAB      Ectopic      Multiple       Live Births           Obstetric Comments  Last mammogram: 12/25/2018 Last colonoscopy: 2012 Last Pap smear: 08/15/2019, negative, HPV negative History of abnormal Pap smears: No History of sexually transmitted diseases: Denies Age at menarche: 18          Home Medications    Prior to Admission medications   Medication Sig Start Date End Date Taking? Authorizing Provider  albuterol (PROVENTIL HFA;VENTOLIN HFA) 108 (90 Base) MCG/ACT inhaler Inhale 2 puffs into the lungs every 4 (four) hours as needed for wheezing or shortness of breath. 09/01/18   Glade Lloyd, MD  Calcium Carbonate-Vitamin D (CALCIUM 600+D PO) Take 1 tablet by mouth 2 (two) times daily.    [provider]  FeFum-FePoly-FA-B Cmp-C-Biot (INTEGRA PLUS) CAPS Take 1 capsule by mouth daily. 03/12/21   [provider]  levothyroxine (SYNTHROID) 125 MCG tablet Take 150 mcg by mouth. 05/25/21   [provider]  loratadine (CLARITIN) 10 MG tablet Take 10 mg by mouth daily.    [provider]  metolazone (ZAROXOLYN) 2.5 MG tablet Take 1 tablet (2.5 mg total) by mouth as directed. Tuesday, Thursday, Saturday 06/23/23 09/21/23  Patwardhan, Anabel Bene, MD  olopatadine (PATANOL) 0.1 % ophthalmic solution Place 1 drop into both eyes 2 (two) times daily.  03/26/20   [provider]  rosuvastatin (CRESTOR) 10 MG tablet Take 10 mg by mouth at bedtime. 11/28/17   [provider]  senna-docusate (SENOKOT-S) 8.6-50 MG tablet Take 1 tablet by mouth 2 (two) times daily as needed for moderate constipation. 02/26/20   Almon Hercules, MD  sodium chloride (OCEAN) 0.65 % SOLN nasal spray Place 1 spray into both nostrils as needed for congestion.    [provider]  torsemide (DEMADEX) 20 MG tablet Take 1 tablet (20 mg total) by mouth 2 (two) times daily. 1-2 pills daily as needed for leg edema 06/23/23   Patwardhan, Anabel Bene, MD  valsartan (DIOVAN) 40 MG tablet Take 1 tablet by mouth daily.  02/17/23   [provider]    Family History Family History  Problem Relation Age of Onset   Hypertension Mother    Pancreatic cancer Mother    Lung cancer Father    Breast cancer Maternal Aunt    Ovarian cancer Neg Hx    Endometrial cancer Neg Hx    Colon cancer Neg Hx    Rectal cancer Neg Hx    Stomach cancer Neg Hx     Social History Social History   Tobacco Use   Smoking status: Never   Smokeless tobacco: Never  Vaping Use   Vaping status: Never Used  Substance Use Topics   Alcohol use: No  Drug use: No     Allergies   Procardia [nifedipine]   Review of Systems Review of Systems  Constitutional:  Negative for fever.  Musculoskeletal:  Positive for arthralgias and gait problem.  Skin:  Positive for color change. Negative for pallor and wound.  Neurological:  Negative for weakness and numbness.     Physical Exam Triage Vital Signs ED Triage Vitals  Encounter Vitals Group     BP 09/02/23 1834 104/60     Systolic BP Percentile --      Diastolic BP Percentile --      Pulse Rate 09/02/23 1834 84     Resp 09/02/23 1834 20     Temp 09/02/23 1834 99.3 F (37.4 C)     Temp Source 09/02/23 1834 Oral     SpO2 09/02/23 1834 94 %     Weight 09/02/23 1837 255 lb (115.7 kg)     Height 09/02/23 1837 5\' 4"  (1.626 m)     Head Circumference --      Peak Flow --      Pain Score 09/02/23 1834 6     Pain Loc --      Pain Education --      Exclude from Growth Chart --    No data found.  Updated Vital Signs BP 104/60 (BP Location: Right Arm)   Pulse 84   Temp 99.3 F (37.4 C) (Oral)   Resp 20   Ht 5\' 4"  (1.626 m)   Wt 255 lb (115.7 kg)   LMP 10/22/2011   SpO2 94%   BMI 43.77 kg/m   Visual Acuity Right Eye Distance:   Left Eye Distance:   Bilateral Distance:    Right Eye Near:   Left Eye Near:    Bilateral Near:     Physical Exam Vitals and nursing note reviewed.  Constitutional:      Appearance: She is obese. She is not ill-appearing.   HENT:     Head: Normocephalic and atraumatic.  Pulmonary:     Effort: Pulmonary effort is normal. No respiratory distress.  Musculoskeletal:     Right lower leg: No tenderness.     Left lower leg: No tenderness.     Left ankle: Swelling (Diffuse swelling) present. No tenderness. Normal range of motion.     Left Achilles Tendon: Normal.     Left foot: Decreased range of motion. Swelling and tenderness present.       Legs:     Comments: Calf circumference relatively equal bilaterally No posterior calf tenderness, no palpable cord, bilateral varicosities noted  Skin:    General: Skin is dry.     Findings: Erythema (Erythema left foot first MTPJ, faint erythema anterior left lower leg) present.  Neurological:     Mental Status: She is alert.      UC Treatments / Results  Labs (all labs ordered are listed, but only abnormal results are displayed) Labs Reviewed - No data to display  EKG   Radiology No results found.  Procedures Procedures (including critical care time)  Medications Ordered in UC Medications  predniSONE (DELTASONE) tablet 60 mg (has no administration in time range)    Initial Impression / Assessment and Plan / UC Course  I have reviewed the triage vital signs and the nursing notes.  Pertinent labs & imaging results that were available during my care of the patient were reviewed by me and considered in my medical decision making (see chart for details).    Chart  reviewed, patient had similar symptoms 2 years ago. Discussed with patient suspect gout, recommend course of steroids.  If symptoms worsen should go to ED, pacifically for calf pain, worsening swelling, development of fever, chest pain or shortness of breath Final Clinical Impressions(s) / UC Diagnoses   Final diagnoses:  None   Discharge Instructions   None    ED Prescriptions   None    PDMP not reviewed this encounter.   Meliton Rattan, [place] 09/02/23 1901

## 2023-09-02 NOTE — ED Triage Notes (Signed)
Pt presents with swelling and pain in left ankle & foot since 11/5. Pt denies recent injury and denies taking medication for her pain. Pt reports "I can barely put any weight on it. I have tried putting ice on it and elevating it, seemed to be better yesterday but is unbearable today."

## 2023-09-02 NOTE — Discharge Instructions (Addendum)
See your doctor Monday for recheck, go to the emergency department for worsening symptoms new symptoms or concerns specifically for fever, calf pain or tenderness, worsening swelling, and intolerable pain chest pain or shortness of breath

## 2023-09-10 ENCOUNTER — Emergency Department (HOSPITAL_BASED_OUTPATIENT_CLINIC_OR_DEPARTMENT_OTHER): Payer: BC Managed Care – PPO

## 2023-09-10 ENCOUNTER — Encounter (HOSPITAL_BASED_OUTPATIENT_CLINIC_OR_DEPARTMENT_OTHER): Payer: Self-pay

## 2023-09-10 ENCOUNTER — Observation Stay (HOSPITAL_BASED_OUTPATIENT_CLINIC_OR_DEPARTMENT_OTHER)
Admission: EM | Admit: 2023-09-10 | Discharge: 2023-09-12 | Disposition: A | Discharge status: Home / Self Care | Payer: BC Managed Care – PPO | Attending: Internal Medicine | Admitting: Internal Medicine

## 2023-09-10 DIAGNOSIS — G4733 Obstructive sleep apnea (adult) (pediatric): Secondary | ICD-10-CM | POA: Insufficient documentation

## 2023-09-10 DIAGNOSIS — Z8541 Personal history of malignant neoplasm of cervix uteri: Secondary | ICD-10-CM | POA: Insufficient documentation

## 2023-09-10 DIAGNOSIS — Z79899 Other long term (current) drug therapy: Secondary | ICD-10-CM | POA: Insufficient documentation

## 2023-09-10 DIAGNOSIS — J4521 Mild intermittent asthma with (acute) exacerbation: Secondary | ICD-10-CM

## 2023-09-10 DIAGNOSIS — R1013 Epigastric pain: Secondary | ICD-10-CM

## 2023-09-10 DIAGNOSIS — Z1152 Encounter for screening for COVID-19: Secondary | ICD-10-CM | POA: Diagnosis not present

## 2023-09-10 DIAGNOSIS — N179 Acute kidney failure, unspecified: Secondary | ICD-10-CM | POA: Diagnosis not present

## 2023-09-10 DIAGNOSIS — R059 Cough, unspecified: Secondary | ICD-10-CM | POA: Insufficient documentation

## 2023-09-10 DIAGNOSIS — E785 Hyperlipidemia, unspecified: Secondary | ICD-10-CM | POA: Diagnosis not present

## 2023-09-10 DIAGNOSIS — R42 Dizziness and giddiness: Secondary | ICD-10-CM | POA: Diagnosis not present

## 2023-09-10 DIAGNOSIS — J189 Pneumonia, unspecified organism: Secondary | ICD-10-CM | POA: Insufficient documentation

## 2023-09-10 DIAGNOSIS — M545 Low back pain, unspecified: Secondary | ICD-10-CM | POA: Diagnosis not present

## 2023-09-10 DIAGNOSIS — I509 Heart failure, unspecified: Secondary | ICD-10-CM | POA: Diagnosis not present

## 2023-09-10 DIAGNOSIS — J45909 Unspecified asthma, uncomplicated: Secondary | ICD-10-CM | POA: Diagnosis not present

## 2023-09-10 DIAGNOSIS — J9601 Acute respiratory failure with hypoxia: Secondary | ICD-10-CM

## 2023-09-10 DIAGNOSIS — R748 Abnormal levels of other serum enzymes: Secondary | ICD-10-CM

## 2023-09-10 DIAGNOSIS — I130 Hypertensive heart and chronic kidney disease with heart failure and stage 1 through stage 4 chronic kidney disease, or unspecified chronic kidney disease: Secondary | ICD-10-CM | POA: Insufficient documentation

## 2023-09-10 DIAGNOSIS — M546 Pain in thoracic spine: Secondary | ICD-10-CM

## 2023-09-10 DIAGNOSIS — N1831 Chronic kidney disease, stage 3a: Secondary | ICD-10-CM | POA: Diagnosis not present

## 2023-09-10 DIAGNOSIS — E876 Hypokalemia: Secondary | ICD-10-CM | POA: Diagnosis not present

## 2023-09-10 LAB — CBC WITH DIFFERENTIAL/PLATELET
Abs Immature Granulocytes: 0.16 10*3/uL — ABNORMAL HIGH (ref 0.00–0.07)
Basophils Absolute: 0 10*3/uL (ref 0.0–0.1)
Basophils Relative: 0 %
Eosinophils Absolute: 0 10*3/uL (ref 0.0–0.5)
Eosinophils Relative: 0 %
HCT: 29.3 % — ABNORMAL LOW (ref 36.0–46.0)
Hemoglobin: 9.6 g/dL — ABNORMAL LOW (ref 12.0–15.0)
Immature Granulocytes: 1 %
Lymphocytes Relative: 5 %
Lymphs Abs: 0.8 10*3/uL (ref 0.7–4.0)
MCH: 31.2 pg (ref 26.0–34.0)
MCHC: 32.8 g/dL (ref 30.0–36.0)
MCV: 95.1 fL (ref 80.0–100.0)
Monocytes Absolute: 0.8 10*3/uL (ref 0.1–1.0)
Monocytes Relative: 5 %
Neutro Abs: 14.1 10*3/uL — ABNORMAL HIGH (ref 1.7–7.7)
Neutrophils Relative %: 89 %
Platelets: 234 10*3/uL (ref 150–400)
RBC: 3.08 MIL/uL — ABNORMAL LOW (ref 3.87–5.11)
RDW: 14.3 % (ref 11.5–15.5)
WBC: 15.8 10*3/uL — ABNORMAL HIGH (ref 4.0–10.5)
nRBC: 0 % (ref 0.0–0.2)

## 2023-09-10 LAB — TROPONIN I (HIGH SENSITIVITY)
Troponin I (High Sensitivity): 17 ng/L (ref <18)
Troponin I (High Sensitivity): 15 ng/L (ref <18)

## 2023-09-10 LAB — COMPREHENSIVE METABOLIC PANEL
ALT: 15 U/L (ref 0–44)
AST: 12 U/L — ABNORMAL LOW (ref 15–41)
Albumin: 4.1 g/dL (ref 3.5–5.0)
Alkaline Phosphatase: 61 U/L (ref 38–126)
Anion gap: 13 (ref 5–15)
BUN: 90 mg/dL — ABNORMAL HIGH (ref 8–23)
CO2: 33 mmol/L — ABNORMAL HIGH (ref 22–32)
Calcium: 8.6 mg/dL — ABNORMAL LOW (ref 8.9–10.3)
Chloride: 90 mmol/L — ABNORMAL LOW (ref 98–111)
Creatinine, Ser: 2.05 mg/dL — ABNORMAL HIGH (ref 0.44–1.00)
GFR, Estimated: 26 mL/min — ABNORMAL LOW (ref >60)
Glucose, Bld: 108 mg/dL — ABNORMAL HIGH (ref 70–99)
Potassium: 3.4 mmol/L — ABNORMAL LOW (ref 3.5–5.1)
Sodium: 136 mmol/L (ref 135–145)
Total Bilirubin: 0.8 mg/dL (ref <1.2)
Total Protein: 7.4 g/dL (ref 6.5–8.1)

## 2023-09-10 LAB — URINALYSIS, W/ REFLEX TO CULTURE (INFECTION SUSPECTED)
Bilirubin Urine: NEGATIVE
Glucose, UA: NEGATIVE mg/dL
Hgb urine dipstick: NEGATIVE
Ketones, ur: NEGATIVE mg/dL
Leukocytes,Ua: NEGATIVE
Nitrite: NEGATIVE
Protein, ur: NEGATIVE mg/dL
Specific Gravity, Urine: 1.013 (ref 1.005–1.030)
pH: 5 (ref 5.0–8.0)

## 2023-09-10 LAB — LACTIC ACID, PLASMA
Lactic Acid, Venous: 1.1 mmol/L (ref 0.5–1.9)
Lactic Acid, Venous: 0.7 mmol/L (ref 0.5–1.9)

## 2023-09-10 LAB — RESP PANEL BY RT-PCR (RSV, FLU A&B, COVID)  RVPGX2
Influenza A by PCR: NEGATIVE
Influenza B by PCR: NEGATIVE
Resp Syncytial Virus by PCR: NEGATIVE
SARS Coronavirus 2 by RT PCR: NEGATIVE

## 2023-09-10 LAB — LIPASE, BLOOD: Lipase: 123 U/L — ABNORMAL HIGH (ref 11–51)

## 2023-09-10 LAB — BRAIN NATRIURETIC PEPTIDE: B Natriuretic Peptide: 57.8 pg/mL (ref 0.0–100.0)

## 2023-09-10 MED ORDER — FENTANYL CITRATE PF 50 MCG/ML IJ SOSY
25.0000 ug | PREFILLED_SYRINGE | Freq: Once | INTRAMUSCULAR | Status: AC | PRN
  Administered 2023-09-10: 25 ug via INTRAVENOUS
  Filled 2023-09-10: qty 1

## 2023-09-10 MED ORDER — IPRATROPIUM-ALBUTEROL 0.5-2.5 (3) MG/3ML IN SOLN: 3.0000 mL | RESPIRATORY_TRACT | Status: DC | PRN

## 2023-09-10 MED ORDER — ONDANSETRON HCL 4 MG/2ML IJ SOLN: 4.0000 mg | Freq: Four times a day (QID) | INTRAMUSCULAR | Status: DC | PRN

## 2023-09-10 MED ORDER — OXYCODONE HCL 5 MG PO TABS
2.5000 mg | ORAL_TABLET | Freq: Four times a day (QID) | ORAL | Status: DC | PRN
  Administered 2023-09-11 (×2): 2.5 mg via ORAL
  Filled 2023-09-10 (×2): qty 1

## 2023-09-10 MED ORDER — FENTANYL CITRATE PF 50 MCG/ML IJ SOSY
50.0000 ug | PREFILLED_SYRINGE | Freq: Once | INTRAMUSCULAR | Status: AC
  Administered 2023-09-10: 50 ug via INTRAVENOUS
  Filled 2023-09-10: qty 1

## 2023-09-10 MED ORDER — SODIUM CHLORIDE 0.9 % IV SOLN
2.0000 g | Freq: Once | INTRAVENOUS | Status: AC
Start: 2023-09-10 — End: 2023-09-10
  Administered 2023-09-10: 2 g via INTRAVENOUS
  Filled 2023-09-10: qty 20

## 2023-09-10 MED ORDER — METHYLPREDNISOLONE SODIUM SUCC 125 MG IJ SOLR
125.0000 mg | Freq: Once | INTRAMUSCULAR | Status: AC
  Administered 2023-09-10: 125 mg via INTRAVENOUS
  Filled 2023-09-10: qty 2

## 2023-09-10 MED ORDER — NALOXONE HCL 0.4 MG/ML IJ SOLN: 0.4000 mg | INTRAMUSCULAR | Status: DC | PRN

## 2023-09-10 MED ORDER — IOHEXOL 350 MG/ML SOLN
100.0000 mL | Freq: Once | INTRAVENOUS | Status: AC | PRN
  Administered 2023-09-10: 100 mL via INTRAVENOUS

## 2023-09-10 MED ORDER — DEXTROSE 5 % IV SOLN
500.0000 mg | Freq: Once | INTRAVENOUS | Status: AC
  Administered 2023-09-10: 500 mg via INTRAVENOUS
  Filled 2023-09-10: qty 5

## 2023-09-10 MED ORDER — HEPARIN SODIUM (PORCINE) 5000 UNIT/ML IJ SOLN
5000.0000 [IU] | Freq: Three times a day (TID) | INTRAMUSCULAR | Status: DC
  Administered 2023-09-11 – 2023-09-12 (×5): 5000 [IU] via SUBCUTANEOUS
  Filled 2023-09-10 (×5): qty 1

## 2023-09-10 MED ORDER — SODIUM CHLORIDE 0.9 % IV SOLN
1.0000 g | INTRAVENOUS | Status: DC
  Administered 2023-09-11 – 2023-09-12 (×2): 1 g via INTRAVENOUS
  Filled 2023-09-10 (×2): qty 10

## 2023-09-10 MED ORDER — ACETAMINOPHEN 650 MG RE SUPP: 650.0000 mg | Freq: Four times a day (QID) | RECTAL | Status: DC | PRN

## 2023-09-10 MED ORDER — ONDANSETRON HCL 4 MG PO TABS: 4.0000 mg | ORAL_TABLET | Freq: Four times a day (QID) | ORAL | Status: DC | PRN

## 2023-09-10 MED ORDER — IPRATROPIUM-ALBUTEROL 0.5-2.5 (3) MG/3ML IN SOLN
3.0000 mL | RESPIRATORY_TRACT | Status: AC
  Administered 2023-09-10 (×3): 3 mL via RESPIRATORY_TRACT
  Filled 2023-09-10 (×3): qty 3

## 2023-09-10 MED ORDER — DOCUSATE SODIUM 100 MG PO CAPS: 100.0000 mg | ORAL_CAPSULE | Freq: Two times a day (BID) | ORAL | Status: DC

## 2023-09-10 MED ORDER — ACETAMINOPHEN 325 MG PO TABS
650.0000 mg | ORAL_TABLET | Freq: Four times a day (QID) | ORAL | Status: DC | PRN
  Administered 2023-09-11 – 2023-09-12 (×3): 650 mg via ORAL
  Filled 2023-09-10 (×3): qty 2

## 2023-09-10 MED ORDER — AZITHROMYCIN 250 MG PO TABS
500.0000 mg | ORAL_TABLET | Freq: Every day | ORAL | Status: DC
  Administered 2023-09-11 – 2023-09-12 (×2): 500 mg via ORAL
  Filled 2023-09-10 (×2): qty 2

## 2023-09-10 MED ORDER — SORBITOL 70 % SOLN: 30.0000 mL | Freq: Every day | Status: DC | PRN

## 2023-09-10 MED ORDER — POTASSIUM CHLORIDE 10 MEQ/100ML IV SOLN
10.0000 meq | INTRAVENOUS | Status: AC
Start: 2023-09-11 — End: 2023-09-11
  Administered 2023-09-11 (×2): 10 meq via INTRAVENOUS
  Filled 2023-09-10 (×2): qty 100

## 2023-09-10 NOTE — H&P (Signed)
History and Physical    Patient: Bridget Lamb DOB: 1956-04-29 DOA: 09/10/2023 DOS: the patient was seen and examined on 09/10/2023 PCP: Dani Gobble, PA-C  Patient coming from: Home  Chief Complaint:  Chief Complaint  Patient presents with   Back Pain   Dizziness   HPI: Bridget Lamb is a 67 y.o. female with medical history significant endometrial adenocarcinoma grade 1, OSA on CPAP, obesity, asthma, hyperlipidemia, hypertension, and CKD stage IIIa who presented to the emergency department today with his severe back pain.  4 days ago the patient had a violent sneeze.  After that time she had a severe In her right side.  The following day the pain moved into her upper back across her shoulder blades and then it settled into her lower back.  The pain moved around but it was very severe and the patient was having trouble standing up and walking.  She started to feel dizzy with standing.  She had the severe pain when trying to get up and then she got really dizzy.  She was worried so she came into the emergency department.   In the emergency department the patient actually had a CT of her chest abdomen and pelvis to rule out dissection.  No dissection was found no pneumonia or any other significant findings were found.  It is felt that perhaps she just pulled a muscle after her cough.  She may have a bronchitis as well. She did require narcotics for her back pain.  She will be admitted to the hospitalist service for pain control.  Her postural dizziness is felt to be secondary to some mild orthostatic hypotension as the patient was being aggressively diuresed as an outpatient.  She did have mild acute kidney injury so she will be given some fluids overnight as she did receive contrast with her CT.   Echocardiogram 11/17/2022:  Poor echo window. Wall motion abnormality at reduced sensitivity.  Left ventricle cavity is normal in size. Normal left ventricular wall   thickness. Hypokinetic global wall motion. Doppler evidence of grade II  (pseudonormal) diastolic dysfunction, elevated LAP. Normal LV systolic  function with visual EF 55-60%.  Left atrial cavity is moderately dilated.  Structurally normal mitral valve.  Mild (Grade I) mitral regurgitation.  E-wave dominant mitral inflow.  Structurally normal tricuspid valve.  Mild tricuspid regurgitation.  Moderate pulmonary hypertension. RVSP measures 59 mmHg.  IVC is dilated with poor inspiration collapse consistent with elevated  right atrial pressure.  Compared to the study done on 03/01/2021, no significant change.     Review of Systems: As mentioned in the history of present illness. All other systems reviewed and are negative. Past Medical History:  Diagnosis Date   Allergic rhinitis    Anemia    hematology, Dr. Arbutus Ped, last 01/2011   Asthma    Cervix cancer (HCC)    CHF (congestive heart failure) (HCC) 2004   Chronic kidney disease    protein in urine   Complication of anesthesia    Dyslipidemia    Dyspnea    on excertion   Endometrial cancer (HCC)    H/O echocardiogram 01/07/04   mild LVH, nl LV systolic function, EF 60%, left atrial enlargement, mildly thickened aortic and mitral valves   History of radiation therapy 8/4-21-07/24/20   Pelvis IMRT and HDR, Dr. Antony Blackbird   History of thyroid cancer    Hypertension    Incarcerated ventral hernia 10/07/2013   Obesity, Class III, BMI 40-49.9 (  morbid obesity) (HCC) 10/09/2013   Osteopenia    PONV (postoperative nausea and vomiting)    Sleep apnea    Umbilical hernia    Unspecified essential hypertension 10/09/2013   Unspecified hypothyroidism 10/09/2013   Past Surgical History:  Procedure Laterality Date   BONE MARROW BIOPSY     CARDIAC CATHETERIZATION  2005   CHOLECYSTECTOMY  2003   COLONOSCOPY  2008   COLONOSCOPY  08/17/2022   ROBOTIC ASSISTED TOTAL HYSTERECTOMY WITH BILATERAL SALPINGO OOPHERECTOMY N/A 10/25/2019    Procedure: XI ROBOTIC ASSISTED TOTAL HYSTERECTOMY WITH BILATERAL SALPINGO OOPHORECTOMY;  Surgeon: Carver Fila, MD;  Location: [facility];  Service: Gynecology;  Laterality: N/A;   SENTINEL NODE BIOPSY N/A 10/25/2019   Procedure: SENTINEL LYMPH NODE BIOPSY;  Surgeon: Carver Fila, MD;  Location: [facility];  Service: Gynecology;  Laterality: N/A;   THYROID SURGERY     partial thyroidectomy   TOTAL THYROIDECTOMY  2009   VENTRAL HERNIA REPAIR N/A 10/07/2013   Procedure: HERNIA REPAIR VENTRAL ADULT;  Surgeon: Cherylynn Ridges, MD;  Location: [facility] OR;  Service: General;  Laterality: N/A;   Social History:  reports that she has never smoked. She has never used smokeless tobacco. She reports that she does not drink alcohol and does not use drugs.  Allergies  Allergen Reactions   Procardia [Nifedipine] Other (See Comments)    Ineffective     Family History  Problem Relation Age of Onset   Hypertension Mother    Pancreatic cancer Mother    Lung cancer Father    Breast cancer Maternal Aunt    Ovarian cancer Neg Hx    Endometrial cancer Neg Hx    Colon cancer Neg Hx    Rectal cancer Neg Hx    Stomach cancer Neg Hx     Prior to Admission medications   Medication Sig Start Date End Date Taking? Authorizing Provider  albuterol (PROVENTIL HFA;VENTOLIN HFA) 108 (90 Base) MCG/ACT inhaler Inhale 2 puffs into the lungs every 4 (four) hours as needed for wheezing or shortness of breath. 09/01/18   Glade Lloyd, MD  Calcium Carbonate-Vitamin D (CALCIUM 600+D PO) Take 1 tablet by mouth 2 (two) times daily.    [provider]  FeFum-FePoly-FA-B Cmp-C-Biot (INTEGRA PLUS) CAPS Take 1 capsule by mouth daily. 03/12/21   [provider]  levothyroxine (SYNTHROID) 125 MCG tablet Take 150 mcg by mouth. 05/25/21   [provider]  loratadine (CLARITIN) 10 MG tablet Take 10 mg by mouth daily.    [provider]  metolazone (ZAROXOLYN) 2.5 MG tablet Take 1 tablet (2.5 mg  total) by mouth as directed. Tuesday, Thursday, Saturday 06/23/23 09/21/23  Patwardhan, Anabel Bene, MD  olopatadine (PATANOL) 0.1 % ophthalmic solution Place 1 drop into both eyes 2 (two) times daily.  03/26/20   [provider]  predniSONE (STERAPRED UNI-PAK 21 TAB) 10 MG (21) TBPK tablet Take as directed 09/02/23   Meliton Rattan, PA  rosuvastatin (CRESTOR) 10 MG tablet Take 10 mg by mouth at bedtime. 11/28/17   [provider]  senna-docusate (SENOKOT-S) 8.6-50 MG tablet Take 1 tablet by mouth 2 (two) times daily as needed for moderate constipation. 02/26/20   Almon Hercules, MD  sodium chloride (OCEAN) 0.65 % SOLN nasal spray Place 1 spray into both nostrils as needed for congestion.    [provider]  torsemide (DEMADEX) 20 MG tablet Take 1 tablet (20 mg total) by mouth 2 (two) times daily. 1-2 pills daily as needed  for leg edema 06/23/23   Patwardhan, Anabel Bene, MD  valsartan (DIOVAN) 40 MG tablet Take 1 tablet by mouth daily. 02/17/23   [provider]    Physical Exam: Vitals:   09/10/23 1715 09/10/23 1930 09/10/23 2000 09/10/23 2226  BP: 94/79 (!) 122/58 (!) 143/57 (!) 136/56  Pulse: 79 89 93 78  Resp: 20 17 15 18   Temp:   98 F (36.7 C) 98.2 F (36.8 C)  TempSrc:   Oral Oral  SpO2: 92% 97% 93% 95%  Weight:      Height:       Physical Exam:  General: No acute distress, well developed, well nourished, obese HEENT: Normocephalic, atraumatic, PERRL Cardiovascular: Normal rate and rhythm. Distal pulses intact. Pulmonary: Normal pulmonary effort, mild insp andexp wheezing bilateally Gastrointestinal: Nondistended abdomen, soft, non-tender, normoactive bowel sounds Musculoskeletal:Normal ROM, no lower ext edema Skin: Skin is warm and dry. Neuro: No focal deficits noted, AAOx3. PSYCH: Attentive and cooperative  Data Reviewed:  Results for orders placed or performed during the hospital encounter of 09/10/23 (from the past 24 hour(s))  Lactic acid,  plasma     Status: None   Collection Time: 09/10/23  1:42 PM  Result Value Ref Range   Lactic Acid, Venous 1.1 0.5 - 1.9 mmol/L  CBC with Differential     Status: Abnormal   Collection Time: 09/10/23  1:42 PM  Result Value Ref Range   WBC 15.8 (H) 4.0 - 10.5 K/uL   RBC 3.08 (L) 3.87 - 5.11 MIL/uL   Hemoglobin 9.6 (L) 12.0 - 15.0 g/dL   HCT 16.1 (L) 09.6 - 04.5 %   MCV 95.1 80.0 - 100.0 fL   MCH 31.2 26.0 - 34.0 pg   MCHC 32.8 30.0 - 36.0 g/dL   RDW 40.9 81.1 - 91.4 %   Platelets 234 150 - 400 K/uL   nRBC 0.0 0.0 - 0.2 %   Neutrophils Relative % 89 %   Neutro Abs 14.1 (H) 1.7 - 7.7 K/uL   Lymphocytes Relative 5 %   Lymphs Abs 0.8 0.7 - 4.0 K/uL   Monocytes Relative 5 %   Monocytes Absolute 0.8 0.1 - 1.0 K/uL   Eosinophils Relative 0 %   Eosinophils Absolute 0.0 0.0 - 0.5 K/uL   Basophils Relative 0 %   Basophils Absolute 0.0 0.0 - 0.1 K/uL   Immature Granulocytes 1 %   Abs Immature Granulocytes 0.16 (H) 0.00 - 0.07 K/uL  Comprehensive metabolic panel     Status: Abnormal   Collection Time: 09/10/23  1:42 PM  Result Value Ref Range   Sodium 136 135 - 145 mmol/L   Potassium 3.4 (L) 3.5 - 5.1 mmol/L   Chloride 90 (L) 98 - 111 mmol/L   CO2 33 (H) 22 - 32 mmol/L   Glucose, Bld 108 (H) 70 - 99 mg/dL   BUN 90 (H) 8 - 23 mg/dL   Creatinine, Ser 7.82 (H) 0.44 - 1.00 mg/dL   Calcium 8.6 (L) 8.9 - 10.3 mg/dL   Total Protein 7.4 6.5 - 8.1 g/dL   Albumin 4.1 3.5 - 5.0 g/dL   AST 12 (L) 15 - 41 U/L   ALT 15 0 - 44 U/L   Alkaline Phosphatase 61 38 - 126 U/L   Total Bilirubin 0.8 <1.2 mg/dL   GFR, Estimated 26 (L) >60 mL/min   Anion gap 13 5 - 15  Lipase, blood     Status: Abnormal   Collection Time: 09/10/23  1:42  PM  Result Value Ref Range   Lipase 123 (H) 11 - 51 U/L  Troponin I (High Sensitivity)     Status: None   Collection Time: 09/10/23  1:42 PM  Result Value Ref Range   Troponin I (High Sensitivity) 17 <18 ng/L  Brain natriuretic peptide     Status: None   Collection  Time: 09/10/23  1:42 PM  Result Value Ref Range   B Natriuretic Peptide 57.8 0.0 - 100.0 pg/mL  Lactic acid, plasma     Status: None   Collection Time: 09/10/23  3:28 PM  Result Value Ref Range   Lactic Acid, Venous 0.7 0.5 - 1.9 mmol/L  Urinalysis, w/ Reflex to Culture (Infection Suspected) -Urine, Unspecified Source     Status: Abnormal   Collection Time: 09/10/23  5:02 PM  Result Value Ref Range   Specimen Source URINE, UNSPE    Color, Urine YELLOW YELLOW   APPearance CLEAR CLEAR   Specific Gravity, Urine 1.013 1.005 - 1.030   pH 5.0 5.0 - 8.0   Glucose, UA NEGATIVE NEGATIVE mg/dL   Hgb urine dipstick NEGATIVE NEGATIVE   Bilirubin Urine NEGATIVE NEGATIVE   Ketones, ur NEGATIVE NEGATIVE mg/dL   Protein, ur NEGATIVE NEGATIVE mg/dL   Nitrite NEGATIVE NEGATIVE   Leukocytes,Ua NEGATIVE NEGATIVE   RBC / HPF 0-5 0 - 5 RBC/hpf   WBC, UA 0-5 0 - 5 WBC/hpf   Bacteria, UA RARE (A) NONE SEEN   Squamous Epithelial / HPF 0-5 0 - 5 /HPF  Troponin I (High Sensitivity)     Status: None   Collection Time: 09/10/23  7:28 PM  Result Value Ref Range   Troponin I (High Sensitivity) 15 <18 ng/L  Resp panel by RT-PCR (RSV, Flu A&B, Covid) Anterior Nasal Swab     Status: None   Collection Time: 09/10/23  7:28 PM   Specimen: Anterior Nasal Swab  Result Value Ref Range   SARS Coronavirus 2 by RT PCR NEGATIVE NEGATIVE   Influenza A by PCR NEGATIVE NEGATIVE   Influenza B by PCR NEGATIVE NEGATIVE   Resp Syncytial Virus by PCR NEGATIVE NEGATIVE     Assessment and Plan: Cough - CT revealed no evidence of infection or fluid but she is wheezing on exam.  Will order nebs.  2. Back Pain - Symptomatic pain control   3. AKI - Hold metolazone and torsemide for tonight.  Will probably restart her torsemide without the metolazone at the time of discharge.  4. OSA - Continue CPAP   Advance Care Planning:   Code Status: Full Code the patient names her grandson Enid Derry as her surrogate decision-maker and she  wants to be full code.  Consults: None  Family Communication: None  Severity of Illness: The appropriate patient status for this patient is OBSERVATION. Observation status is judged to be reasonable and necessary in order to provide the required intensity of service to ensure the patient's safety. The patient's presenting symptoms, physical exam findings, and initial radiographic and laboratory data in the context of their medical condition is felt to place them at decreased risk for further clinical deterioration. Furthermore, it is anticipated that the patient will be medically stable for discharge from the hospital within 2 midnights of admission.   Author: Buena Irish, MD 09/10/2023 11:59 PM  For on call review [email or web address].

## 2023-09-10 NOTE — ED Provider Notes (Signed)
  Physical Exam  BP (!) 105/44   Pulse 65   Temp 98.4 F (36.9 C)   Resp 12   Ht 5\' 4"  (1.626 m)   Wt 115.7 kg   LMP 10/22/2011   SpO2 99%   BMI 43.77 kg/m     Procedures  Procedures  ED Course / MDM   Clinical Course as of 09/10/23 1924  Sat Sep 10, 2023  1637 Creatinine(!): 2.05 [JL]  1637 BUN(!): 90 [JL]  1637 Hemoglobin(!): 9.6 [JL]  1637 WBC(!): 15.8 [JL]  1637 Lipase(!): 123 [JL]    Clinical Course User Index [JL] Ernie Avena, MD   Medical Decision Making Amount and/or Complexity of Data Reviewed Labs: ordered. Decision-making details documented in ED Course. Radiology: ordered.  Risk Prescription drug management. Decision regarding hospitalization.   67 year old female presenting to the [facility] with back pain, history of CHF and CKD, found to have an AKI on CKD with a creatinine of 2.05 from a recent baseline of 1.5-1.8.  I evaluated the patient bedside.  Her CT without contrast did not reveal any acute abnormalities.  She endorses shortness of breath and was found to be hypoxic and was placed on oxygen via nasal cannula.  She does states she has a history of asthma and she found found to be diffusely wheezing on exam.  Will be administering albuterol in addition to Solu-Medrol for likely asthma exacerbation in the setting of suspected URI.  Additionally, the patient has epigastric pain and tenderness as well as back pain with a slightly elevated lipase to 123.  Patient describes symptoms coming on after she sneezed violently on Thursday, symptoms worse with twisting and movement consistent with potential musculoskeletal strain.  Patient symptoms could be consistent with developing pancreatitis as well however given the migratory nature of the patient's pain in her back, radiating down to her low back and abdomen, cannot fully rule out aortic dissection.  The patient's EKG revealed sinus rhythm with PACs, ventricular rate 69, nonspecific T wave changes in  the inferior, anterior leads, QTc prolonged at 486.  Patient does have worsening renal function with a GFR of 26 and a likely AKI on CKD with a creatinine of 2.05 and a BUN of 90 however given the patient's migratory pain, cannot fully rule out aortic dissection, discussed further evaluation with CTA dissection study which the patient consented to. Discussed with Dr. Roda Shutters of [facility] radiology who also agreed with plan for CTA imaging.   CTA Dissection: IMPRESSION:  No evidence of aortic dissection or aneurysmal dilatation.    The remainder of the exam is stable from the noncontrast study  obtained 3 hours previous.    Section ruled out, suspect early developing pancreatitis given the patient's tenderness on exam, elevated lipase, also given the patient's AKI, creatinine 2.05, BUN 98, recommended hospitalist admission for further monitoring.  Dr. Margo Aye was consulted and accepted the patient in admission.      Ernie Avena, MD 09/10/23 [phone]

## 2023-09-10 NOTE — ED Triage Notes (Signed)
States sneezed last night and developed acute back pain right lower back that radiates upwards to across upper back and across lower back.  Present in wheel chair.

## 2023-09-10 NOTE — ED Triage Notes (Signed)
States gets dizzy when stands up.  Noted BP lower than her normal.

## 2023-09-10 NOTE — ED Provider Notes (Signed)
[facility] AT [facility] Provider Note   CSN: [id number] Arrival date & time: 09/10/23  1208     History  Chief Complaint  Patient presents with   Back Pain   Dizziness    Bridget Lamb is a 67 y.o. female.   Back Pain Dizziness Patient presents with back pain and dizziness.  States last night she sneezed and developed pain in her back.  Went to right arm and then down to her lower back.  No numbness or weakness.  Also has had a cough with occasional sputum production.  No fevers.  Has not had pains like this before but states she does get back hands at times.  Patient states she has now felt dizzy when she stands up.  Had initial blood pressure of 90.  Does wear CPAP at night.   Past Medical History:  Diagnosis Date   Allergic rhinitis    Anemia    hematology, Dr. Arbutus Ped, last 01/2011   Asthma    Cervix cancer (HCC)    CHF (congestive heart failure) (HCC) 2004   Chronic kidney disease    protein in urine   Complication of anesthesia    Dyslipidemia    Dyspnea    on excertion   Endometrial cancer (HCC)    H/O echocardiogram 01/07/04   mild LVH, nl LV systolic function, EF 60%, left atrial enlargement, mildly thickened aortic and mitral valves   History of radiation therapy 8/4-21-07/24/20   Pelvis IMRT and HDR, Dr. Antony Blackbird   History of thyroid cancer    Hypertension    Incarcerated ventral hernia 10/07/2013   Obesity, Class III, BMI 40-49.9 (morbid obesity) (HCC) 10/09/2013   Osteopenia    PONV (postoperative nausea and vomiting)    Sleep apnea    Umbilical hernia    Unspecified essential hypertension 10/09/2013   Unspecified hypothyroidism 10/09/2013    Home Medications Prior to Admission medications   Medication Sig Start Date End Date Taking? Authorizing Provider  albuterol (PROVENTIL HFA;VENTOLIN HFA) 108 (90 Base) MCG/ACT inhaler Inhale 2 puffs into the lungs every 4 (four) hours as needed for wheezing or  shortness of breath. 09/01/18   Glade Lloyd, MD  Calcium Carbonate-Vitamin D (CALCIUM 600+D PO) Take 1 tablet by mouth 2 (two) times daily.    [provider]  FeFum-FePoly-FA-B Cmp-C-Biot (INTEGRA PLUS) CAPS Take 1 capsule by mouth daily. 03/12/21   [provider]  levothyroxine (SYNTHROID) 125 MCG tablet Take 150 mcg by mouth. 05/25/21   [provider]  loratadine (CLARITIN) 10 MG tablet Take 10 mg by mouth daily.    [provider]  metolazone (ZAROXOLYN) 2.5 MG tablet Take 1 tablet (2.5 mg total) by mouth as directed. Tuesday, Thursday, Saturday 06/23/23 09/21/23  Patwardhan, Anabel Bene, MD  olopatadine (PATANOL) 0.1 % ophthalmic solution Place 1 drop into both eyes 2 (two) times daily.  03/26/20   [provider]  predniSONE (STERAPRED UNI-PAK 21 TAB) 10 MG (21) TBPK tablet Take as directed 09/02/23   Meliton Rattan, PA  rosuvastatin (CRESTOR) 10 MG tablet Take 10 mg by mouth at bedtime. 11/28/17   [provider]  senna-docusate (SENOKOT-S) 8.6-50 MG tablet Take 1 tablet by mouth 2 (two) times daily as needed for moderate constipation. 02/26/20   Almon Hercules, MD  sodium chloride (OCEAN) 0.65 % SOLN nasal spray Place 1 spray into both nostrils as needed for congestion.    [provider]  torsemide (DEMADEX) 20 MG  tablet Take 1 tablet (20 mg total) by mouth 2 (two) times daily. 1-2 pills daily as needed for leg edema 06/23/23   Patwardhan, Anabel Bene, MD  valsartan (DIOVAN) 40 MG tablet Take 1 tablet by mouth daily. 02/17/23   [provider]      Allergies    Procardia [nifedipine]    Review of Systems   Review of Systems  Musculoskeletal:  Positive for back pain.  Neurological:  Positive for dizziness.    Physical Exam Updated Vital Signs BP (!) 105/44   Pulse 65   Temp 98.4 F (36.9 C)   Resp 12   Ht 5\' 4"  (1.626 m)   Wt 115.7 kg   LMP 10/22/2011   SpO2 99%   BMI 43.77 kg/m  Physical Exam Vitals and nursing  note reviewed.  HENT:     Head: Normocephalic.  Cardiovascular:     Rate and Rhythm: Regular rhythm.  Chest:     Chest wall: No tenderness.  Abdominal:     Tenderness: There is no abdominal tenderness.  Musculoskeletal:     Comments: Some lumbar tenderness.  Skin:    General: Skin is warm.     Capillary Refill: Capillary refill takes less than 2 seconds.  Neurological:     Mental Status: She is alert and oriented to person, place, and time.     ED Results / Procedures / Treatments   Labs (all labs ordered are listed, but only abnormal results are displayed) Labs Reviewed  CBC WITH DIFFERENTIAL/PLATELET - Abnormal; Notable for the following components:      Result Value   WBC 15.8 (*)    RBC 3.08 (*)    Hemoglobin 9.6 (*)    HCT 29.3 (*)    Neutro Abs 14.1 (*)    Abs Immature Granulocytes 0.16 (*)    All other components within normal limits  COMPREHENSIVE METABOLIC PANEL - Abnormal; Notable for the following components:   Potassium 3.4 (*)    Chloride 90 (*)    CO2 33 (*)    Glucose, Bld 108 (*)    BUN 90 (*)    Creatinine, Ser 2.05 (*)    Calcium 8.6 (*)    AST 12 (*)    GFR, Estimated 26 (*)    All other components within normal limits  LIPASE, BLOOD - Abnormal; Notable for the following components:   Lipase 123 (*)    All other components within normal limits  LACTIC ACID, PLASMA  LACTIC ACID, PLASMA    EKG None  Radiology DG Chest Portable 1 View  Result Date: 09/10/2023 CLINICAL DATA:  Hypoxia and hypotension EXAM: PORTABLE CHEST 1 VIEW COMPARISON:  Chest radiograph dated 02/27/2021 FINDINGS: Surgical clips project over the neck. Normal lung volumes. Bibasilar patchy opacities. Trace blunting of the bilateral costophrenic angles. No pneumothorax. Similar mildly enlarged cardiomediastinal silhouette. No acute osseous abnormality. IMPRESSION: 1. Bibasilar patchy opacities, which may represent atelectasis, aspiration, or pneumonia. 2. Trace blunting of the  bilateral costophrenic angles, which may reflect pleural effusions. 3. Similar mild cardiomegaly. Electronically Signed   By: Agustin Cree M.D.   On: 09/10/2023 14:06    Procedures Procedures    Medications Ordered in ED Medications  cefTRIAXone (ROCEPHIN) 2 g in sodium chloride 0.9 % 100 mL IVPB (has no administration in time range)  azithromycin (ZITHROMAX) 500 mg in dextrose 5 % 250 mL IVPB (has no administration in time range)    ED Course/ Medical Decision Making/ A&P  Medical Decision Making Amount and/or Complexity of Data Reviewed Labs: ordered. Radiology: ordered.   Patient presented with hypotension.  Had cough last night developed back pain that went from right arm and down to low back.  Has had a little bit of a cough.  Has had some mild hypoxia.  X-ray showed potential pneumonia.  With cough we will treat with antibiotics.  However other causes of back pain such as pancreatitis considered.  Lipase is elevated.  Also thoughts of aortic dissection since pain started on the upper back and went to the lower back.  Has had some mildly low blood pressure that is improved.  History of CHF.  CT scan without contrast to be done care turned over to Dr. Karene Fry.  Lactic acid is normal.  Creatinine just slightly above baseline.  With history of CHF I am going to somewhat limit fluid at this time.        Final Clinical Impression(s) / ED Diagnoses Final diagnoses:  Community acquired pneumonia, unspecified laterality    Rx / DC Orders ED Discharge Orders     None         Benjiman Core, MD 09/10/23 1505

## 2023-09-10 NOTE — ED Notes (Signed)
Patient transported to CT 

## 2023-09-10 NOTE — H&P (Incomplete)
History and Physical    Patient: Bridget Lamb ZSW:[id number] DOB: 07-Dec-1955 DOA: 09/10/2023 DOS: the patient was seen and examined on 09/10/2023 PCP: Dani Gobble, PA-C  Patient coming from: Home  Chief Complaint:  Chief Complaint  Patient presents with  . Back Pain  . Dizziness   HPI: Bridget Lamb is a 67 y.o. female with medical history significant endometrial adenocarcinoma grade 1, OSA on CPAP, obesity, asthma, hyperlipidemia, hypertension, and CKD stage IIIa     Review of Systems: As mentioned in the history of present illness. All other systems reviewed and are negative. Past Medical History:  Diagnosis Date  . Allergic rhinitis   . Anemia    hematology, Dr. Arbutus Ped, last 01/2011  . Asthma   . Cervix cancer (HCC)   . CHF (congestive heart failure) (HCC) 2004  . Chronic kidney disease    protein in urine  . Complication of anesthesia   . Dyslipidemia   . Dyspnea    on excertion  . Endometrial cancer (HCC)   . H/O echocardiogram 01/07/04   mild LVH, nl LV systolic function, EF 60%, left atrial enlargement, mildly thickened aortic and mitral valves  . History of radiation therapy 8/4-21-07/24/20   Pelvis IMRT and HDR, Dr. Antony Blackbird  . History of thyroid cancer   . Hypertension   . Incarcerated ventral hernia 10/07/2013  . Obesity, Class III, BMI 40-49.9 (morbid obesity) (HCC) 10/09/2013  . Osteopenia   . PONV (postoperative nausea and vomiting)   . Sleep apnea   . Umbilical hernia   . Unspecified essential hypertension 10/09/2013  . Unspecified hypothyroidism 10/09/2013   Past Surgical History:  Procedure Laterality Date  . BONE MARROW BIOPSY    . CARDIAC CATHETERIZATION  2005  . CHOLECYSTECTOMY  2003  . COLONOSCOPY  2008  . COLONOSCOPY  08/17/2022  . ROBOTIC ASSISTED TOTAL HYSTERECTOMY WITH BILATERAL SALPINGO OOPHERECTOMY N/A 10/25/2019   Procedure: XI ROBOTIC ASSISTED TOTAL HYSTERECTOMY WITH BILATERAL SALPINGO OOPHORECTOMY;   Surgeon: Carver Fila, MD;  Location: [facility];  Service: Gynecology;  Laterality: N/A;  . SENTINEL NODE BIOPSY N/A 10/25/2019   Procedure: SENTINEL LYMPH NODE BIOPSY;  Surgeon: Carver Fila, MD;  Location: [facility];  Service: Gynecology;  Laterality: N/A;  . THYROID SURGERY     partial thyroidectomy  . TOTAL THYROIDECTOMY  2009  . VENTRAL HERNIA REPAIR N/A 10/07/2013   Procedure: HERNIA REPAIR VENTRAL ADULT;  Surgeon: Cherylynn Ridges, MD;  Location: [facility];  Service: General;  Laterality: N/A;   Social History:  reports that she has never smoked. She has never used smokeless tobacco. She reports that she does not drink alcohol and does not use drugs.  Allergies  Allergen Reactions  . Procardia [Nifedipine] Other (See Comments)    Ineffective     Family History  Problem Relation Age of Onset  . Hypertension Mother   . Pancreatic cancer Mother   . Lung cancer Father   . Breast cancer Maternal Aunt   . Ovarian cancer Neg Hx   . Endometrial cancer Neg Hx   . Colon cancer Neg Hx   . Rectal cancer Neg Hx   . Stomach cancer Neg Hx     Prior to Admission medications   Medication Sig Start Date End Date Taking? Authorizing Provider  albuterol (PROVENTIL HFA;VENTOLIN HFA) 108 (90 Base) MCG/ACT inhaler Inhale 2 puffs into the lungs every 4 (four) hours as needed for wheezing or shortness of breath. 09/01/18   Hanley Ben,  Kshitiz, MD  Calcium Carbonate-Vitamin D (CALCIUM 600+D PO) Take 1 tablet by mouth 2 (two) times daily.    [provider]  FeFum-FePoly-FA-B Cmp-C-Biot (INTEGRA PLUS) CAPS Take 1 capsule by mouth daily. 03/12/21   [provider]  levothyroxine (SYNTHROID) 125 MCG tablet Take 150 mcg by mouth. 05/25/21   [provider]  loratadine (CLARITIN) 10 MG tablet Take 10 mg by mouth daily.    [provider]  metolazone (ZAROXOLYN) 2.5 MG tablet Take 1 tablet (2.5 mg total) by mouth as directed. Tuesday, Thursday, Saturday 06/23/23 09/21/23   Patwardhan, Anabel Bene, MD  olopatadine (PATANOL) 0.1 % ophthalmic solution Place 1 drop into both eyes 2 (two) times daily.  03/26/20   [provider]  predniSONE (STERAPRED UNI-PAK 21 TAB) 10 MG (21) TBPK tablet Take as directed 09/02/23   Meliton Rattan, PA  rosuvastatin (CRESTOR) 10 MG tablet Take 10 mg by mouth at bedtime. 11/28/17   [provider]  senna-docusate (SENOKOT-S) 8.6-50 MG tablet Take 1 tablet by mouth 2 (two) times daily as needed for moderate constipation. 02/26/20   Almon Hercules, MD  sodium chloride (OCEAN) 0.65 % SOLN nasal spray Place 1 spray into both nostrils as needed for congestion.    [provider]  torsemide (DEMADEX) 20 MG tablet Take 1 tablet (20 mg total) by mouth 2 (two) times daily. 1-2 pills daily as needed for leg edema 06/23/23   Patwardhan, Anabel Bene, MD  valsartan (DIOVAN) 40 MG tablet Take 1 tablet by mouth daily. 02/17/23   [provider]    Physical Exam: Vitals:   09/10/23 1715 09/10/23 1930 09/10/23 2000 09/10/23 2226  BP: 94/79 (!) 122/58 (!) 143/57 (!) 136/56  Pulse: 79 89 93 78  Resp: 20 17 15 18   Temp:   98 F (36.7 C) 98.2 F (36.8 C)  TempSrc:   Oral Oral  SpO2: 92% 97% 93% 95%  Weight:      Height:       *** Data Reviewed: {Tip this will not be part of the note when signed- Document your independent interpretation of telemetry tracing, EKG, lab, Radiology test or any other diagnostic tests. Add any new diagnostic test ordered today. (Optional):[id number]} Results for orders placed or performed during the hospital encounter of 09/10/23 (from the past 24 hour(s))  Lactic acid, plasma     Status: None   Collection Time: 09/10/23  1:42 PM  Result Value Ref Range   Lactic Acid, Venous 1.1 0.5 - 1.9 mmol/L  CBC with Differential     Status: Abnormal   Collection Time: 09/10/23  1:42 PM  Result Value Ref Range   WBC 15.8 (H) 4.0 - 10.5 K/uL   RBC 3.08 (L) 3.87 - 5.11 MIL/uL   Hemoglobin 9.6 (L) 12.0 - 15.0  g/dL   HCT 16.1 (L) 09.6 - 04.5 %   MCV 95.1 80.0 - 100.0 fL   MCH 31.2 26.0 - 34.0 pg   MCHC 32.8 30.0 - 36.0 g/dL   RDW 40.9 81.1 - 91.4 %   Platelets 234 150 - 400 K/uL   nRBC 0.0 0.0 - 0.2 %   Neutrophils Relative % 89 %   Neutro Abs 14.1 (H) 1.7 - 7.7 K/uL   Lymphocytes Relative 5 %   Lymphs Abs 0.8 0.7 - 4.0 K/uL   Monocytes Relative 5 %   Monocytes Absolute 0.8 0.1 - 1.0 K/uL   Eosinophils Relative 0 %   Eosinophils Absolute 0.0 0.0 -  0.5 K/uL   Basophils Relative 0 %   Basophils Absolute 0.0 0.0 - 0.1 K/uL   Immature Granulocytes 1 %   Abs Immature Granulocytes 0.16 (H) 0.00 - 0.07 K/uL  Comprehensive metabolic panel     Status: Abnormal   Collection Time: 09/10/23  1:42 PM  Result Value Ref Range   Sodium 136 135 - 145 mmol/L   Potassium 3.4 (L) 3.5 - 5.1 mmol/L   Chloride 90 (L) 98 - 111 mmol/L   CO2 33 (H) 22 - 32 mmol/L   Glucose, Bld 108 (H) 70 - 99 mg/dL   BUN 90 (H) 8 - 23 mg/dL   Creatinine, Ser 1.61 (H) 0.44 - 1.00 mg/dL   Calcium 8.6 (L) 8.9 - 10.3 mg/dL   Total Protein 7.4 6.5 - 8.1 g/dL   Albumin 4.1 3.5 - 5.0 g/dL   AST 12 (L) 15 - 41 U/L   ALT 15 0 - 44 U/L   Alkaline Phosphatase 61 38 - 126 U/L   Total Bilirubin 0.8 <1.2 mg/dL   GFR, Estimated 26 (L) >60 mL/min   Anion gap 13 5 - 15  Lipase, blood     Status: Abnormal   Collection Time: 09/10/23  1:42 PM  Result Value Ref Range   Lipase 123 (H) 11 - 51 U/L  Troponin I (High Sensitivity)     Status: None   Collection Time: 09/10/23  1:42 PM  Result Value Ref Range   Troponin I (High Sensitivity) 17 <18 ng/L  Brain natriuretic peptide     Status: None   Collection Time: 09/10/23  1:42 PM  Result Value Ref Range   B Natriuretic Peptide 57.8 0.0 - 100.0 pg/mL  Lactic acid, plasma     Status: None   Collection Time: 09/10/23  3:28 PM  Result Value Ref Range   Lactic Acid, Venous 0.7 0.5 - 1.9 mmol/L  Urinalysis, w/ Reflex to Culture (Infection Suspected) -Urine, Unspecified Source     Status:  Abnormal   Collection Time: 09/10/23  5:02 PM  Result Value Ref Range   Specimen Source URINE, UNSPE    Color, Urine YELLOW YELLOW   APPearance CLEAR CLEAR   Specific Gravity, Urine 1.013 1.005 - 1.030   pH 5.0 5.0 - 8.0   Glucose, UA NEGATIVE NEGATIVE mg/dL   Hgb urine dipstick NEGATIVE NEGATIVE   Bilirubin Urine NEGATIVE NEGATIVE   Ketones, ur NEGATIVE NEGATIVE mg/dL   Protein, ur NEGATIVE NEGATIVE mg/dL   Nitrite NEGATIVE NEGATIVE   Leukocytes,Ua NEGATIVE NEGATIVE   RBC / HPF 0-5 0 - 5 RBC/hpf   WBC, UA 0-5 0 - 5 WBC/hpf   Bacteria, UA RARE (A) NONE SEEN   Squamous Epithelial / HPF 0-5 0 - 5 /HPF  Troponin I (High Sensitivity)     Status: None   Collection Time: 09/10/23  7:28 PM  Result Value Ref Range   Troponin I (High Sensitivity) 15 <18 ng/L  Resp panel by RT-PCR (RSV, Flu A&B, Covid) Anterior Nasal Swab     Status: None   Collection Time: 09/10/23  7:28 PM   Specimen: Anterior Nasal Swab  Result Value Ref Range   SARS Coronavirus 2 by RT PCR NEGATIVE NEGATIVE   Influenza A by PCR NEGATIVE NEGATIVE   Influenza B by PCR NEGATIVE NEGATIVE   Resp Syncytial Virus by PCR NEGATIVE NEGATIVE     Assessment and Plan: Cough  2. Back Pain -     Advance Care Planning:  Code Status: Full Code the patient names her grandson Enid Derry as her Social research officer, government and she wants to be full code.  Consults: None  Family Communication: None  Severity of Illness: The appropriate patient status for this patient is OBSERVATION. Observation status is judged to be reasonable and necessary in order to provide the required intensity of service to ensure the patient's safety. The patient's presenting symptoms, physical exam findings, and initial radiographic and laboratory data in the context of their medical condition is felt to place them at decreased risk for further clinical deterioration. Furthermore, it is anticipated that the patient will be medically stable for discharge from the  hospital within 2 midnights of admission.   Author: Buena Irish, MD 09/10/2023 11:59 PM  For on call review [email or web address].

## 2023-09-10 NOTE — ED Notes (Signed)
At 1310 pt was sleeping and oxygen sat 82, pt is osa pt and wears cpap at night. 2 liters oxygen applied

## 2023-09-11 ENCOUNTER — Other Ambulatory Visit: Payer: Self-pay

## 2023-09-11 DIAGNOSIS — R059 Cough, unspecified: Secondary | ICD-10-CM | POA: Diagnosis not present

## 2023-09-11 DIAGNOSIS — M549 Dorsalgia, unspecified: Secondary | ICD-10-CM | POA: Diagnosis not present

## 2023-09-11 DIAGNOSIS — N179 Acute kidney failure, unspecified: Secondary | ICD-10-CM | POA: Diagnosis not present

## 2023-09-11 LAB — MAGNESIUM: Magnesium: 2.2 mg/dL (ref 1.7–2.4)

## 2023-09-11 LAB — BASIC METABOLIC PANEL
Anion gap: 13 (ref 5–15)
BUN: 87 mg/dL — ABNORMAL HIGH (ref 8–23)
CO2: 30 mmol/L (ref 22–32)
Calcium: 8.7 mg/dL — ABNORMAL LOW (ref 8.9–10.3)
Chloride: 94 mmol/L — ABNORMAL LOW (ref 98–111)
Creatinine, Ser: 1.88 mg/dL — ABNORMAL HIGH (ref 0.44–1.00)
GFR, Estimated: 29 mL/min — ABNORMAL LOW (ref >60)
Glucose, Bld: 171 mg/dL — ABNORMAL HIGH (ref 70–99)
Potassium: 3.1 mmol/L — ABNORMAL LOW (ref 3.5–5.1)
Sodium: 137 mmol/L (ref 135–145)

## 2023-09-11 LAB — PHOSPHORUS: Phosphorus: 4.4 mg/dL (ref 2.5–4.6)

## 2023-09-11 LAB — CBC
HCT: 31.3 % — ABNORMAL LOW (ref 36.0–46.0)
Hemoglobin: 10.1 g/dL — ABNORMAL LOW (ref 12.0–15.0)
MCH: 31.5 pg (ref 26.0–34.0)
MCHC: 32.3 g/dL (ref 30.0–36.0)
MCV: 97.5 fL (ref 80.0–100.0)
Platelets: 224 10*3/uL (ref 150–400)
RBC: 3.21 MIL/uL — ABNORMAL LOW (ref 3.87–5.11)
RDW: 13.7 % (ref 11.5–15.5)
WBC: 11.3 10*3/uL — ABNORMAL HIGH (ref 4.0–10.5)
nRBC: 0 % (ref 0.0–0.2)

## 2023-09-11 LAB — TSH: TSH: 0.469 u[IU]/mL (ref 0.350–4.500)

## 2023-09-11 LAB — HIV ANTIBODY (ROUTINE TESTING W REFLEX): HIV Screen 4th Generation wRfx: NONREACTIVE

## 2023-09-11 MED ORDER — IPRATROPIUM-ALBUTEROL 0.5-2.5 (3) MG/3ML IN SOLN
3.0000 mL | Freq: Once | RESPIRATORY_TRACT | Status: AC
  Administered 2023-09-11: 3 mL via RESPIRATORY_TRACT
  Filled 2023-09-11: qty 3

## 2023-09-11 MED ORDER — POTASSIUM CHLORIDE CRYS ER 20 MEQ PO TBCR
40.0000 meq | EXTENDED_RELEASE_TABLET | Freq: Two times a day (BID) | ORAL | Status: AC
  Administered 2023-09-11 (×2): 40 meq via ORAL
  Filled 2023-09-11 (×2): qty 2

## 2023-09-11 MED ORDER — IPRATROPIUM-ALBUTEROL 0.5-2.5 (3) MG/3ML IN SOLN
3.0000 mL | Freq: Four times a day (QID) | RESPIRATORY_TRACT | Status: DC
  Administered 2023-09-11: 3 mL via RESPIRATORY_TRACT
  Filled 2023-09-11: qty 3

## 2023-09-11 MED ORDER — GUAIFENESIN ER 600 MG PO TB12
1200.0000 mg | ORAL_TABLET | Freq: Two times a day (BID) | ORAL | Status: DC
  Administered 2023-09-11 – 2023-09-12 (×2): 1200 mg via ORAL
  Filled 2023-09-11 (×2): qty 2

## 2023-09-11 MED ORDER — KCL IN DEXTROSE-NACL 40-5-0.45 MEQ/L-%-% IV SOLN
INTRAVENOUS | Status: DC
  Filled 2023-09-11 (×2): qty 1000

## 2023-09-11 MED ORDER — IPRATROPIUM-ALBUTEROL 0.5-2.5 (3) MG/3ML IN SOLN
3.0000 mL | Freq: Two times a day (BID) | RESPIRATORY_TRACT | Status: DC
  Administered 2023-09-11 – 2023-09-12 (×2): 3 mL via RESPIRATORY_TRACT
  Filled 2023-09-11 (×3): qty 3

## 2023-09-11 MED ORDER — LIDOCAINE 5 % EX PTCH
1.0000 | MEDICATED_PATCH | CUTANEOUS | Status: DC
  Administered 2023-09-11: 1 via TRANSDERMAL
  Filled 2023-09-11: qty 1

## 2023-09-11 MED ORDER — FENTANYL CITRATE PF 50 MCG/ML IJ SOSY: 25.0000 ug | PREFILLED_SYRINGE | INTRAMUSCULAR | Status: DC | PRN

## 2023-09-11 NOTE — Plan of Care (Signed)

## 2023-09-11 NOTE — Progress Notes (Signed)
PROGRESS NOTE    Bridget Lamb  UXL:[id number] DOB: 10-Jul-1956 DOA: 09/10/2023 PCP: Dani Gobble, PA-C   Brief Narrative:  HPI per Dr. Buena Irish on 09/10/23  Bridget Lamb Bridget Lamb is a 67 y.o. female with medical history significant endometrial adenocarcinoma grade 1, OSA on CPAP, obesity, asthma, hyperlipidemia, hypertension, and CKD stage IIIa who presented to the emergency department today with his severe back pain.  4 days ago the patient had a violent sneeze.  After that time she had a severe In her right side.  The following day the pain moved into her upper back across her shoulder blades and then it settled into her lower back.  The pain moved around but it was very severe and the patient was having trouble standing up and walking.  She started to feel dizzy with standing.  She had the severe pain when trying to get up and then she got really dizzy.  She was worried so she came into the emergency department.   In the emergency department the patient actually had a CT of her chest abdomen and pelvis to rule out dissection.  No dissection was found no pneumonia or any other significant findings were found.  It is felt that perhaps she just pulled a muscle after her cough.  She may have a bronchitis as well. She did require narcotics for her back pain.  She will be admitted to the hospitalist service for pain control.  Her postural dizziness is felt to be secondary to some mild orthostatic hypotension as the patient was being aggressively diuresed as an outpatient.  She did have mild acute kidney injury so she will be given some fluids overnight as she did receive contrast with her CT.  **Interim History  Will resume low-dose IV fluids and repeat orthostatic vital signs in the morning.  Will treat IV antibiotics and continue monitor renal function carefully.  Have added more pain control with lidocaine patch, IV fentanyl and K-pad.  Anticipating discharge in next 24 to 48  hours if she going fairly well.  Assessment and Plan:  Cough, with Pneumonia ruled out but suspect Bronchitis  -Placed in Obs Telemetry -CXR done and showed "Bibasilar patchy opacities, which may represent atelectasis, aspiration, or pneumonia. Trace blunting of the bilateral costophrenic angles, which may reflect pleural effusions. Similar mild cardiomegaly." -CT Chest/Abd/Pelvis done and showed "No aortic aneurysm. Unable to evaluate for dissection on this noncontrast study. No acute intrathoracic abnormality. Colonic diverticulosis with no acute diverticulitis. Aortic Atherosclerosis (ICD10-I70.0) including four-vessel coronary calcification." And CTA Chest/Abd/Pelvis done and showed "No evidence of aortic dissection or aneurysmal dilatation. The remainder of the exam is stable from the noncontrast study obtained 3 hours previous." -CT Scan's mentioned no Pneumonia and Aortic Dissection ruled out -COVID and -WBC Trend: Recent Labs  Lab 09/10/23 1342 09/11/23 0547  WBC 15.8* 11.3*  -C/w IV Ceftriaxone and po Azithromycin -C/w DuoNeb q4hprn Wheezing and SOB and add Guaifensin 1200 mg po BID, Flutter Valve, and Incentive Spirometry  -Repeat CXR in the AM   Severe Back Upper Back Pain -C/w Analgesics with as needed oxycodone 2.5 mg every 6 as needed for moderate pain and also add back IV fentanyl 25 mcg every 2 hours as needed severe pain -Also add lidocaine patch 1 patch transdermally q. 24 and add K-pad as well -Add pain that moved in her upper back across her shoulder blades and then subsequently radiated to her lower back  Dizziness -Likely in setting of pain but  will need to check orthostatics -Did have some possible dizziness thought to the setting of orthostatic hypotension with mild given that she had been aggressively diuresed in outpatient setting -Check orthostatics in the a.m. -Resume IVF with D5 1/2 NS at 40 mL/hr -TSH Was 0.469  Grade 1 Endometrial Adenocarcinoma   -Follow up with Oncology in the outpatient setting   AKI on CKD Stage 3a -BUN/Cr Trend: Recent Labs  Lab 09/10/23 1342 09/11/23 0547  BUN 90* 87*  CREATININE 2.05* 1.88*  -IVF now resumed and continue holding Metolazone and Torsemide currently -Avoid Nephrotoxic Medications, Contrast Dyes, Hypotension and Dehydration to Ensure Adequate Renal Perfusion and will need to Renally Adjust Meds -Continue to Monitor and Trend Renal Function carefully and repeat CMP in the AM   Normocytic Anemia -Hgb/Hct Trend: Recent Labs  Lab 09/10/23 1342 09/11/23 0547  HGB 9.6* 10.1*  HCT 29.3* 31.3*  MCV 95.1 97.5  -Check Anemia Panel in the AM -Continue to Monitor for S/Sx of bleeding; No overt bleeding noted -Repeat CBC in the AM   Hypokalemia -Patient's K+ Level Trend: Recent Labs  Lab 09/10/23 1342 09/11/23 0547  K 3.4* 3.1*  -Replete with IV Kcl 20 mEQ and po KCL 40 mEQ BID x -Continue to Monitor and Replete as Necessary -Repeat CMP in the AM   OSA -C/w CPAP with home nasal pillows  Morbid Obesity -Complicates overall prognosis and care -Estimated body mass index is 43.77 kg/m as calculated from the following:   Height as of this encounter: 5\' 4"  (1.626 m).   Weight as of this encounter: 115.7 kg.  -Weight Loss and Dietary Counseling given   DVT prophylaxis: heparin injection 5,000 Units Start: 09/11/23 0600    Code Status: Full Code Family Communication: No family present at bedside  Disposition Plan:  Level of care: Telemetry Status is: Observation The patient remains OBS appropriate and will d/c before 2 midnights.   Consultants:  None  Procedures:  As delineated as above  Antimicrobials:  Anti-infectives (From admission, onward)    Start     Dose/Rate Route Frequency Ordered Stop   09/11/23 1000  azithromycin (ZITHROMAX) tablet 500 mg        500 mg Oral Daily 09/10/23 2248     09/11/23 0900  cefTRIAXone (ROCEPHIN) 1 g in sodium chloride 0.9 % 100 mL IVPB         1 g 200 mL/hr over 30 Minutes Intravenous Every 24 hours 09/10/23 2248     09/10/23 1445  cefTRIAXone (ROCEPHIN) 2 g in sodium chloride 0.9 % 100 mL IVPB        2 g 200 mL/hr over 30 Minutes Intravenous  Once 09/10/23 1443 09/10/23 1704   09/10/23 1445  azithromycin (ZITHROMAX) 500 mg in dextrose 5 % 250 mL IVPB        500 mg 250 mL/hr over 60 Minutes Intravenous  Once 09/10/23 1443 09/10/23 1712       Subjective: Seen and examined at bedside and thinks she is doing little bit better.  States her back pain is improving but still has some now.  No nausea or vomiting.  Denies lightheadedness or dizziness.  No other concerns or complaints at this time.  Objective: Vitals:   09/11/23 0237 09/11/23 0639 09/11/23 0932 09/11/23 1230  BP: (!) 129/55 127/65 (!) 118/59 126/89  Pulse: 80 75 79 78  Resp: 18 18 17 18   Temp: 97.7 F (36.5 C) 98.1 F (36.7 C) 97.7 F (36.5 C) 97.8 F (  36.6 C)  TempSrc: Oral Oral Oral Oral  SpO2: 98% 97% 100% 100%  Weight:      Height:        Intake/Output Summary (Last 24 hours) at 09/11/2023 1925 Last data filed at 09/11/2023 1800 Gross per 24 hour  Intake 217.63 ml  Output --  Net 217.63 ml   Filed Weights   09/10/23 1228  Weight: 115.7 kg   Examination: Physical Exam:  Constitutional: WN/WD morbidly obese Caucasian female no acute distress Respiratory: Diminished to auscultation bilaterally, no wheezing, rales, rhonchi or crackles. Normal respiratory effort and patient is not tachypenic. No accessory muscle use.  Unlabored breathing Cardiovascular: RRR, no murmurs / rubs / gallops. S1 and S2 auscultated. No extremity edema.  Abdomen: Soft, non-tender, non-distended. Bowel sounds positive.  GU: Deferred. Musculoskeletal: No clubbing / cyanosis of digits/nails. No joint deformity upper and lower extremities.  Skin: No rashes, lesions, ulcers on limited skin evaluation. No induration; Warm and dry.  Neurologic: CN 2-12 grossly intact with  no focal deficits. Romberg sign and cerebellar reflexes not assessed.  Psychiatric: Normal judgment and insight. Alert and oriented x 3. Normal mood and appropriate affect.   Data Reviewed: I have personally reviewed following labs and imaging studies  CBC: Recent Labs  Lab 09/10/23 1342 09/11/23 0547  WBC 15.8* 11.3*  NEUTROABS 14.1*  --   HGB 9.6* 10.1*  HCT 29.3* 31.3*  MCV 95.1 97.5  PLT 234 224   Basic Metabolic Panel: Recent Labs  Lab 09/10/23 1342 09/11/23 0547  NA 136 137  K 3.4* 3.1*  CL 90* 94*  CO2 33* 30  GLUCOSE 108* 171*  BUN 90* 87*  CREATININE 2.05* 1.88*  CALCIUM 8.6* 8.7*  MG  --  2.2  PHOS  --  4.4   GFR: Estimated Creatinine Clearance: 35.8 mL/min (A) (by C-G formula based on SCr of 1.88 mg/dL (H)). Liver Function Tests: Recent Labs  Lab 09/10/23 1342  AST 12*  ALT 15  ALKPHOS 61  BILITOT 0.8  PROT 7.4  ALBUMIN 4.1   Recent Labs  Lab 09/10/23 1342  LIPASE 123*   No results for input(s): "AMMONIA" in the last 168 hours. Coagulation Profile: No results for input(s): "INR", "PROTIME" in the last 168 hours. Cardiac Enzymes: No results for input(s): "CKTOTAL", "CKMB", "CKMBINDEX", "TROPONINI" in the last 168 hours. BNP (last 3 results) Recent Labs    12/09/22 0823 03/30/23 0935 06/22/23 0837  PROBNP 1,133* 685* 379*   HbA1C: No results for input(s): "HGBA1C" in the last 72 hours. CBG: No results for input(s): "GLUCAP" in the last 168 hours. Lipid Profile: No results for input(s): "CHOL", "HDL", "LDLCALC", "TRIG", "CHOLHDL", "LDLDIRECT" in the last 72 hours. Thyroid Function Tests: Recent Labs    09/11/23 0547  TSH 0.469   Anemia Panel: No results for input(s): "VITAMINB12", "FOLATE", "FERRITIN", "TIBC", "IRON", "RETICCTPCT" in the last 72 hours. Sepsis Labs: Recent Labs  Lab 09/10/23 1342 09/10/23 1528  LATICACIDVEN 1.1 0.7   Recent Results (from the past 240 hour(s))  Resp panel by RT-PCR (RSV, Flu A&B, Covid)  Anterior Nasal Swab     Status: None   Collection Time: 09/10/23  7:28 PM   Specimen: Anterior Nasal Swab  Result Value Ref Range Status   SARS Coronavirus 2 by RT PCR NEGATIVE NEGATIVE Final    Comment: (NOTE) SARS-CoV-2 target nucleic acids are NOT DETECTED.  The SARS-CoV-2 RNA is generally detectable in upper respiratory specimens during the acute phase of infection. The lowest  concentration of SARS-CoV-2 viral copies this assay can detect is 138 copies/mL. A negative result does not preclude SARS-Cov-2 infection and should not be used as the sole basis for treatment or other patient management decisions. A negative result may occur with  improper specimen collection/handling, submission of specimen other than nasopharyngeal swab, presence of viral mutation(s) within the areas targeted by this assay, and inadequate number of viral copies(<138 copies/mL). A negative result must be combined with clinical observations, patient history, and epidemiological information. The expected result is Negative.  Fact Sheet for Patients:  BloggerCourse.com  Fact Sheet for Healthcare Providers:  SeriousBroker.it  This test is no t yet approved or cleared by the [place] FDA and  has been authorized for detection and/or diagnosis of SARS-CoV-2 by FDA under an Emergency Use Authorization (EUA). This EUA will remain  in effect (meaning this test can be used) for the duration of the COVID-19 declaration under Section 564(b)(1) of the Act, 21 U.S.C.section 360bbb-3(b)(1), unless the authorization is terminated  or revoked sooner.       Influenza A by PCR NEGATIVE NEGATIVE Final   Influenza B by PCR NEGATIVE NEGATIVE Final    Comment: (NOTE) The Xpert Xpress SARS-CoV-2/FLU/RSV plus assay is intended as an aid in the diagnosis of influenza from Nasopharyngeal swab specimens and should not be used as a sole basis for treatment. Nasal washings  and aspirates are unacceptable for Xpert Xpress SARS-CoV-2/FLU/RSV testing.  Fact Sheet for Patients: BloggerCourse.com  Fact Sheet for Healthcare Providers: SeriousBroker.it  This test is not yet approved or cleared by the [place] FDA and has been authorized for detection and/or diagnosis of SARS-CoV-2 by FDA under an Emergency Use Authorization (EUA). This EUA will remain in effect (meaning this test can be used) for the duration of the COVID-19 declaration under Section 564(b)(1) of the Act, 21 U.S.C. section 360bbb-3(b)(1), unless the authorization is terminated or revoked.     Resp Syncytial Virus by PCR NEGATIVE NEGATIVE Final    Comment: (NOTE) Fact Sheet for Patients: BloggerCourse.com  Fact Sheet for Healthcare Providers: SeriousBroker.it  This test is not yet approved or cleared by the [place] FDA and has been authorized for detection and/or diagnosis of SARS-CoV-2 by FDA under an Emergency Use Authorization (EUA). This EUA will remain in effect (meaning this test can be used) for the duration of the COVID-19 declaration under Section 564(b)(1) of the Act, 21 U.S.C. section 360bbb-3(b)(1), unless the authorization is terminated or revoked.  Performed at [facility], [address] [place], [place] [address]     Radiology Studies: CT Angio Chest/Abd/Pel for Dissection W and/or Wo Contrast  Result Date: 09/10/2023 CLINICAL DATA:  Chest pain following sneezing episode, initial encounter EXAM: CT ANGIOGRAPHY CHEST, ABDOMEN AND PELVIS TECHNIQUE: Non-contrast CT of the chest was initially obtained. Multidetector CT imaging through the chest, abdomen and pelvis was performed using the standard protocol during bolus administration of intravenous contrast. Multiplanar reconstructed images and MIPs were obtained and reviewed to evaluate the  vascular anatomy. RADIATION DOSE REDUCTION: This exam was performed according to the departmental dose-optimization program which includes automated exposure control, adjustment of the mA and/or kV according to patient size and/or use of iterative reconstruction technique. CONTRAST:  OMNIPAQUE IOHEXOL 350 MG/ML SOLN COMPARISON:  Noncontrast study obtained earlier in the same day. FINDINGS: CTA CHEST FINDINGS Cardiovascular: Initial precontrast study shows no hyperdense crescent to suggest acute aortic injury. Post-contrast images show mild atherosclerotic calcifications of the thoracic aorta. No aneurysmal  dilatation or dissection is seen. No cardiac enlargement is noted. Minimal coronary calcifications are seen. The pulmonary artery shows a normal branching pattern bilaterally. No filling defect to suggest pulmonary embolism is noted. Mediastinum/Nodes: Thoracic inlet is within normal limits. No hilar or mediastinal adenopathy is noted. The esophagus as visualized is within normal limits. Lungs/Pleura: The lungs are well aerated bilaterally. No focal infiltrate or effusion is seen. No parenchymal nodules are seen. Musculoskeletal: Degenerative changes of the thoracic spine are noted. No rib abnormality is noted. Review of the MIP images confirms the above findings. CTA ABDOMEN AND PELVIS FINDINGS VASCULAR Aorta: Abdominal aorta demonstrates atherosclerotic calcifications without aneurysmal dilatation or evidence of dissection. Celiac: Patent without evidence of aneurysm, dissection, vasculitis or significant stenosis. SMA: Patent without evidence of aneurysm, dissection, vasculitis or significant stenosis. Renals: Both renal arteries are patent without evidence of aneurysm, dissection, vasculitis, fibromuscular dysplasia or significant stenosis. IMA: Patent without evidence of aneurysm, dissection, vasculitis or significant stenosis. Inflow: Iliac show atherosclerotic calcification without aneurysmal  dilatation or stenosis. Veins: No specific venous abnormality is noted. Review of the MIP images confirms the above findings. NON-VASCULAR Hepatobiliary: No focal liver abnormality is seen. Status post cholecystectomy. No biliary dilatation. Pancreas: Unremarkable. No pancreatic ductal dilatation or surrounding inflammatory changes. Spleen: Normal in size without focal abnormality. Adrenals/Urinary Tract: Adrenal glands are unremarkable. Kidneys are normal, without renal calculi, focal lesion, or hydronephrosis. Bladder is unremarkable. Stomach/Bowel: Scattered diverticular changes noted within the sigmoid colon. No obstructive or inflammatory changes of the colon are noted. The appendix is not well visualized. No inflammatory changes to suggest appendicitis are noted. Small bowel and stomach are within normal limits. Lymphatic: No significant adenopathy is noted. Reproductive: Status post hysterectomy. No adnexal masses. Other: No abdominal wall hernia or abnormality. No abdominopelvic ascites. Musculoskeletal: No acute or significant osseous findings. Review of the MIP images confirms the above findings. IMPRESSION: No evidence of aortic dissection or aneurysmal dilatation. The remainder of the exam is stable from the noncontrast study obtained 3 hours previous. Electronically Signed   By: Alcide Clever M.D.   On: 09/10/2023 19:13   CT CHEST ABDOMEN PELVIS WO CONTRAST  Result Date: 09/10/2023 CLINICAL DATA:  Aortic aneurysm suspected Sneezed last night and felt pain across back. Continued pain, history of cholecystectomy, ventral hernia repair, CKD, CHF. EXAM: CT CHEST, ABDOMEN AND PELVIS WITHOUT CONTRAST TECHNIQUE: Multidetector CT imaging of the chest, abdomen and pelvis was performed following the standard protocol without IV contrast. RADIATION DOSE REDUCTION: This exam was performed according to the departmental dose-optimization program which includes automated exposure control, adjustment of the mA  and/or kV according to patient size and/or use of iterative reconstruction technique. COMPARISON:  None Available. FINDINGS: CT CHEST FINDINGS Cardiovascular: Normal heart size. No significant pericardial effusion. The thoracic aorta is normal in caliber. Moderate atherosclerotic plaque of the thoracic aorta. Three-vessel coronary artery calcifications. Mediastinum/Nodes: No gross hilar adenopathy, noting limited sensitivity for the detection of hilar adenopathy on this noncontrast study. No enlarged mediastinal or axillary lymph nodes. Thyroid gland, trachea, and esophagus demonstrate no significant findings. Lungs/Pleura: No focal consolidation. No pulmonary nodule. No pulmonary mass. No pleural effusion. No pneumothorax. Musculoskeletal: No chest wall abnormality. No suspicious lytic or blastic osseous lesions. No acute displaced fracture. Multilevel degenerative changes of the spine. CT ABDOMEN PELVIS FINDINGS Hepatobiliary: No focal liver abnormality. Status post cholecystectomy. No biliary dilatation. Pancreas: No focal lesion. Normal pancreatic contour. No surrounding inflammatory changes. No main pancreatic ductal dilatation. Spleen: Normal in size.  Subcentimeter hypodensity indeterminate. Adrenals/Urinary Tract: No adrenal nodule bilaterally. No nephrolithiasis and no hydronephrosis. No definite contour-deforming renal mass. No ureterolithiasis or hydroureter. The urinary bladder is unremarkable. Stomach/Bowel: Stomach is within normal limits. No evidence of bowel wall thickening or dilatation. Colonic diverticulosis. The appendix is not definitely identified with no inflammatory changes in the right lower quadrant to suggest acute appendicitis. Vascular/Lymphatic: No abdominal aorta or iliac aneurysm. Severe atherosclerotic plaque of the aorta and its branches. No abdominal, pelvic, or inguinal lymphadenopathy. Reproductive: Status post hysterectomy. No adnexal masses. Other: Trace nonspecific simple  intraperitoneal free fluid. No intraperitoneal free gas. No organized fluid collection. Musculoskeletal: Ventral wall hernia repair surgical changes. No abdominal wall hernia or abnormality. Diastasis rectus. No suspicious lytic or blastic osseous lesions. No acute displaced fracture. Multilevel degenerative changes of the spine. IMPRESSION: 1. No aortic aneurysm. Unable to evaluate for dissection on this noncontrast study. 2. No acute intrathoracic abnormality. 3. Colonic diverticulosis with no acute diverticulitis. 4. Aortic Atherosclerosis (ICD10-I70.0) including four-vessel coronary calcification. Electronically Signed   By: Tish Frederickson M.D.   On: 09/10/2023 15:41   DG Chest Portable 1 View  Result Date: 09/10/2023 CLINICAL DATA:  Hypoxia and hypotension EXAM: PORTABLE CHEST 1 VIEW COMPARISON:  Chest radiograph dated 02/27/2021 FINDINGS: Surgical clips project over the neck. Normal lung volumes. Bibasilar patchy opacities. Trace blunting of the bilateral costophrenic angles. No pneumothorax. Similar mildly enlarged cardiomediastinal silhouette. No acute osseous abnormality. IMPRESSION: 1. Bibasilar patchy opacities, which may represent atelectasis, aspiration, or pneumonia. 2. Trace blunting of the bilateral costophrenic angles, which may reflect pleural effusions. 3. Similar mild cardiomegaly. Electronically Signed   By: Agustin Cree M.D.   On: 09/10/2023 14:06    Scheduled Meds:  azithromycin  500 mg Oral Daily   guaiFENesin  1,200 mg Oral BID   heparin  5,000 Units Subcutaneous Q8H   ipratropium-albuterol  3 mL Nebulization BID   lidocaine  1 patch Transdermal Q24H   potassium chloride  40 mEq Oral BID   Continuous Infusions:  cefTRIAXone (ROCEPHIN)  IV Stopped (09/11/23 1000)   dextrose 5 % and 0.45 % NaCl with KCl 40 mEq/L      LOS: 0 days   Marguerita Merles, DO [facility] Available via Epic secure chat 7am-7pm After these hours, please refer to coverage provider listed on  [email or web address] 09/11/2023, 7:25 PM

## 2023-09-11 NOTE — Hospital Course (Addendum)
HPI per Dr. Buena Irish on 09/10/23  Bridget Lamb is a 67 y.o. female with medical history significant endometrial adenocarcinoma grade 1, OSA on CPAP, obesity, asthma, hyperlipidemia, hypertension, and CKD stage IIIa who presented to the emergency department today with his severe back pain.  4 days ago the patient had a violent sneeze.  After that time she had a severe In her right side.  The following day the pain moved into her upper back across her shoulder blades and then it settled into her lower back.  The pain moved around but it was very severe and the patient was having trouble standing up and walking.  She started to feel dizzy with standing.  She had the severe pain when trying to get up and then she got really dizzy.  She was worried so she came into the emergency department.   In the emergency department the patient actually had a CT of her chest abdomen and pelvis to rule out dissection.  No dissection was found no pneumonia or any other significant findings were found.  It is felt that perhaps she just pulled a muscle after her cough.  She may have a bronchitis as well. She did require narcotics for her back pain.  She will be admitted to the hospitalist service for pain control.  Her postural dizziness is felt to be secondary to some mild orthostatic hypotension as the patient was being aggressively diuresed as an outpatient.  She did have mild acute kidney injury so she will be given some fluids overnight as she did receive contrast with her CT.  **Interim History  Will resume low-dose IV fluids and repeat orthostatic vital signs in the morning.  Will treat IV antibiotics and continue monitor renal function carefully.  Have added more pain control with lidocaine patch, IV fentanyl and K-pad.  Anticipating discharge in next 24 to 48 hours if she going fairly well.  Assessment and Plan:  Cough, with Pneumonia ruled out but suspect Bronchitis  -Placed in Obs Telemetry -CXR  done and showed "Bibasilar patchy opacities, which may represent atelectasis, aspiration, or pneumonia. Trace blunting of the bilateral costophrenic angles, which may reflect pleural effusions. Similar mild cardiomegaly." -CT Chest/Abd/Pelvis done and showed "No aortic aneurysm. Unable to evaluate for dissection on this noncontrast study. No acute intrathoracic abnormality. Colonic diverticulosis with no acute diverticulitis. Aortic Atherosclerosis (ICD10-I70.0) including four-vessel coronary calcification." And CTA Chest/Abd/Pelvis done and showed "No evidence of aortic dissection or aneurysmal dilatation. The remainder of the exam is stable from the noncontrast study obtained 3 hours previous." -CT Scan's mentioned no Pneumonia and Aortic Dissection ruled out -COVID and -WBC Trend: Recent Labs  Lab 09/10/23 1342 09/11/23 0547 09/12/23 0529 09/12/23 1159  WBC 15.8* 11.3* 13.5* 13.7*  -C/w IV Ceftriaxone and po Azithromycin -C/w DuoNeb q4hprn Wheezing and SOB and add Guaifensin 1200 mg po BID, Flutter Valve, and Incentive Spirometry  -Repeat CXR in the AM   Severe Back Upper Back Pain -C/w Analgesics with as needed oxycodone 2.5 mg every 6 as needed for moderate pain and also add back IV fentanyl 25 mcg every 2 hours as needed severe pain -Also add lidocaine patch 1 patch transdermally q. 24 and add K-pad as well -Add pain that moved in her upper back across her shoulder blades and then subsequently radiated to her lower back  Dizziness -Likely in setting of pain but will need to check orthostatics -Did have some possible dizziness thought to the setting of orthostatic hypotension with  mild given that she had been aggressively diuresed in outpatient setting -Check orthostatics in the a.m. -No longer on Maintenance IVF and Fluids discontinued  -TSH Was 0.469  Grade 1 Endometrial Adenocarcinoma  -Follow up with Oncology in the outpatient setting   AKI on CKD Stage 3a -BUN/Cr  Trend: Recent Labs  Lab 09/10/23 1342 09/11/23 0547 09/12/23 0529  BUN 90* 87* 94*  CREATININE 2.05* 1.88* 2.08*  -IVF now discontinued; continue holding Metolazone and Torsemide for now -Avoid Nephrotoxic Medications, Contrast Dyes, Hypotension and Dehydration to Ensure Adequate Renal Perfusion and will need to Renally Adjust Meds -Continue to Monitor and Trend Renal Function carefully and repeat CMP in the AM   Normocytic Anemia -Hgb/Hct Trend: Recent Labs  Lab 09/10/23 1342 09/11/23 0547 09/12/23 0529 09/12/23 1159  HGB 9.6* 10.1* 9.7* 9.7*  HCT 29.3* 31.3* 31.9* 29.8*  MCV 95.1 97.5 102.2* 99.0  -Check Anemia Panel in the AM -Continue to Monitor for S/Sx of bleeding; No overt bleeding noted -Repeat CBC in the AM   Hypokalemia -Patient's K+ Level Trend: Recent Labs  Lab 09/10/23 1342 09/11/23 0547 09/12/23 0529  K 3.4* 3.1* 4.9  -Replete with IV Kcl 20 mEQ and po KCL 40 mEQ BID x -Continue to Monitor and Replete as Necessary -Repeat CMP in the AM   OSA -C/w CPAP with home nasal pillows  Morbid Obesity -Complicates overall prognosis and care -Estimated body mass index is 43.77 kg/m as calculated from the following:   Height as of this encounter: 5\' 4"  (1.626 m).   Weight as of this encounter: 115.7 kg.  -Weight Loss and Dietary Counseling given

## 2023-09-11 NOTE — Progress Notes (Signed)
   09/11/23 2033  BiPAP/CPAP/SIPAP  Reason BIPAP/CPAP not in use Non-compliant;Other(comment) (Pt states she doesnt want to use cpap at the hospital)

## 2023-09-11 NOTE — Progress Notes (Signed)
Patient does not want to wear Cpap tonight. Patient can't tolerate any mask on her face. Patient wears Nasal pillows. Encouraged patient to see if someone can bring her home mask. Patient is wearing 2 L

## 2023-09-11 NOTE — Plan of Care (Signed)
  Problem: Health Behavior/Discharge Planning: Goal: Ability to manage health-related needs will improve Outcome: Progressing   Problem: Clinical Measurements: Goal: Ability to maintain clinical measurements within normal limits will improve Outcome: Progressing Goal: Will remain free from infection Outcome: Progressing Goal: Respiratory complications will improve Outcome: Progressing   Problem: Nutrition: Goal: Adequate nutrition will be maintained Outcome: Progressing   Problem: Elimination: Goal: Will not experience complications related to bowel motility Outcome: Progressing Goal: Will not experience complications related to urinary retention Outcome: Progressing   Problem: Safety: Goal: Ability to remain free from injury will improve Outcome: Progressing

## 2023-09-12 ENCOUNTER — Observation Stay (HOSPITAL_COMMUNITY): Payer: BC Managed Care – PPO

## 2023-09-12 DIAGNOSIS — R051 Acute cough: Secondary | ICD-10-CM

## 2023-09-12 DIAGNOSIS — J40 Bronchitis, not specified as acute or chronic: Secondary | ICD-10-CM | POA: Diagnosis not present

## 2023-09-12 DIAGNOSIS — M545 Low back pain, unspecified: Secondary | ICD-10-CM

## 2023-09-12 LAB — CBC WITH DIFFERENTIAL/PLATELET
Abs Immature Granulocytes: 0.11 10*3/uL — ABNORMAL HIGH (ref 0.00–0.07)
Abs Immature Granulocytes: 0.1 10*3/uL — ABNORMAL HIGH (ref 0.00–0.07)
Basophils Absolute: 0 10*3/uL (ref 0.0–0.1)
Basophils Absolute: 0 10*3/uL (ref 0.0–0.1)
Basophils Relative: 0 %
Basophils Relative: 0 %
Eosinophils Absolute: 0 10*3/uL (ref 0.0–0.5)
Eosinophils Absolute: 0.1 10*3/uL (ref 0.0–0.5)
Eosinophils Relative: 0 %
Eosinophils Relative: 0 %
HCT: 31.9 % — ABNORMAL LOW (ref 36.0–46.0)
HCT: 29.8 % — ABNORMAL LOW (ref 36.0–46.0)
Hemoglobin: 9.7 g/dL — ABNORMAL LOW (ref 12.0–15.0)
Hemoglobin: 9.7 g/dL — ABNORMAL LOW (ref 12.0–15.0)
Immature Granulocytes: 1 %
Immature Granulocytes: 1 %
Lymphocytes Relative: 6 %
Lymphocytes Relative: 7 %
Lymphs Abs: 0.9 10*3/uL (ref 0.7–4.0)
Lymphs Abs: 1 10*3/uL (ref 0.7–4.0)
MCH: 31.1 pg (ref 26.0–34.0)
MCH: 32.2 pg (ref 26.0–34.0)
MCHC: 30.4 g/dL (ref 30.0–36.0)
MCHC: 32.6 g/dL (ref 30.0–36.0)
MCV: 102.2 fL — ABNORMAL HIGH (ref 80.0–100.0)
MCV: 99 fL (ref 80.0–100.0)
Monocytes Absolute: 0.6 10*3/uL (ref 0.1–1.0)
Monocytes Absolute: 0.7 10*3/uL (ref 0.1–1.0)
Monocytes Relative: 4 %
Monocytes Relative: 5 %
Neutro Abs: 11.9 10*3/uL — ABNORMAL HIGH (ref 1.7–7.7)
Neutro Abs: 11.8 10*3/uL — ABNORMAL HIGH (ref 1.7–7.7)
Neutrophils Relative %: 89 %
Neutrophils Relative %: 87 %
Platelets: 228 10*3/uL (ref 150–400)
Platelets: 240 10*3/uL (ref 150–400)
RBC: 3.12 MIL/uL — ABNORMAL LOW (ref 3.87–5.11)
RBC: 3.01 MIL/uL — ABNORMAL LOW (ref 3.87–5.11)
RDW: 14.1 % (ref 11.5–15.5)
RDW: 14.4 % (ref 11.5–15.5)
WBC: 13.5 10*3/uL — ABNORMAL HIGH (ref 4.0–10.5)
WBC: 13.7 10*3/uL — ABNORMAL HIGH (ref 4.0–10.5)
nRBC: 0 % (ref 0.0–0.2)
nRBC: 0 % (ref 0.0–0.2)

## 2023-09-12 LAB — COMPREHENSIVE METABOLIC PANEL
ALT: 20 U/L (ref 0–44)
AST: 18 U/L (ref 15–41)
Albumin: 3.1 g/dL — ABNORMAL LOW (ref 3.5–5.0)
Alkaline Phosphatase: 56 U/L (ref 38–126)
Anion gap: 11 (ref 5–15)
BUN: 94 mg/dL — ABNORMAL HIGH (ref 8–23)
CO2: 29 mmol/L (ref 22–32)
Calcium: 8.7 mg/dL — ABNORMAL LOW (ref 8.9–10.3)
Chloride: 96 mmol/L — ABNORMAL LOW (ref 98–111)
Creatinine, Ser: 2.08 mg/dL — ABNORMAL HIGH (ref 0.44–1.00)
GFR, Estimated: 25 mL/min — ABNORMAL LOW (ref >60)
Glucose, Bld: 109 mg/dL — ABNORMAL HIGH (ref 70–99)
Potassium: 4.9 mmol/L (ref 3.5–5.1)
Sodium: 136 mmol/L (ref 135–145)
Total Bilirubin: 0.4 mg/dL (ref <1.2)
Total Protein: 7.1 g/dL (ref 6.5–8.1)

## 2023-09-12 LAB — PHOSPHORUS: Phosphorus: 4.8 mg/dL — ABNORMAL HIGH (ref 2.5–4.6)

## 2023-09-12 LAB — MAGNESIUM: Magnesium: 2.3 mg/dL (ref 1.7–2.4)

## 2023-09-12 MED ORDER — DICLOFENAC SODIUM 1 % EX GEL: 2.0000 g | Freq: Four times a day (QID) | CUTANEOUS | 0 refills | Status: AC

## 2023-09-12 MED ORDER — ACETAMINOPHEN 325 MG PO TABS: 650.0000 mg | ORAL_TABLET | Freq: Four times a day (QID) | ORAL | 0 refills | Status: AC | PRN

## 2023-09-12 MED ORDER — GUAIFENESIN ER 600 MG PO TB12: 600.0000 mg | ORAL_TABLET | Freq: Two times a day (BID) | ORAL | 0 refills | Status: AC | PRN

## 2023-09-12 MED ORDER — DICLOFENAC SODIUM 1 % EX GEL
2.0000 g | Freq: Four times a day (QID) | CUTANEOUS | Status: DC
  Filled 2023-09-12: qty 100

## 2023-09-12 MED ORDER — OXYCODONE HCL 5 MG PO TABS
5.0000 mg | ORAL_TABLET | Freq: Four times a day (QID) | ORAL | Status: DC | PRN
  Administered 2023-09-12: 5 mg via ORAL
  Filled 2023-09-12: qty 1

## 2023-09-12 MED ORDER — LIDOCAINE 5 % EX PTCH: 1.0000 | MEDICATED_PATCH | CUTANEOUS | 0 refills | Status: DC

## 2023-09-12 MED ORDER — AZITHROMYCIN 500 MG PO TABS: 500.0000 mg | ORAL_TABLET | Freq: Every day | ORAL | 0 refills | Status: AC

## 2023-09-12 MED ORDER — OXYCODONE HCL 5 MG PO TABS: 5.0000 mg | ORAL_TABLET | Freq: Four times a day (QID) | ORAL | 0 refills | Status: AC | PRN

## 2023-09-12 MED ORDER — CEFDINIR 300 MG PO CAPS: 300.0000 mg | ORAL_CAPSULE | Freq: Two times a day (BID) | ORAL | 0 refills | Status: AC

## 2023-09-12 MED ORDER — ONDANSETRON HCL 4 MG PO TABS: 4.0000 mg | ORAL_TABLET | Freq: Four times a day (QID) | ORAL | 0 refills | Status: DC | PRN

## 2023-09-12 NOTE — Plan of Care (Signed)
Pt is A&O x 4. VSS, on room air. Sinus rhythm on the tele monitor. Ambulates to bathroom with stand by assist, steady gait noted. IV fluids initiated as ordered. C/o back pain- lidocaine patch applied and prn oxy given as ordered.  Educated on falls and safety precautions, plan of care, pt verbalizes understanding. Bed alarm on. Call bell in reach. Will continue to monitor.   Problem: Education: Goal: Knowledge of General Education information will improve Description: Including pain rating scale, medication(s)/side effects and non-pharmacologic comfort measures Outcome: Progressing   Problem: Health Behavior/Discharge Planning: Goal: Ability to manage health-related needs will improve Outcome: Progressing   Problem: Clinical Measurements: Goal: Ability to maintain clinical measurements within normal limits will improve Outcome: Progressing Goal: Will remain free from infection Outcome: Progressing Goal: Diagnostic test results will improve Outcome: Progressing Goal: Respiratory complications will improve Outcome: Progressing Goal: Cardiovascular complication will be avoided Outcome: Progressing   Problem: Activity: Goal: Risk for activity intolerance will decrease Outcome: Progressing   Problem: Nutrition: Goal: Adequate nutrition will be maintained Outcome: Progressing   Problem: Coping: Goal: Level of anxiety will decrease Outcome: Progressing   Problem: Elimination: Goal: Will not experience complications related to bowel motility Outcome: Progressing Goal: Will not experience complications related to urinary retention Outcome: Progressing   Problem: Pain Management: Goal: General experience of comfort will improve Outcome: Progressing   Problem: Safety: Goal: Ability to remain free from injury will improve Outcome: Progressing   Problem: Skin Integrity: Goal: Risk for impaired skin integrity will decrease Outcome: Progressing

## 2023-09-12 NOTE — Plan of Care (Signed)
  Problem: Education: Goal: Knowledge of General Education information will improve Description: Including pain rating scale, medication(s)/side effects and non-pharmacologic comfort measures Outcome: Progressing   Problem: Clinical Measurements: Goal: Ability to maintain clinical measurements within normal limits will improve Outcome: Progressing   Problem: Activity: Goal: Risk for activity intolerance will decrease Outcome: Progressing   Problem: Pain Management: Goal: General experience of comfort will improve Outcome: Progressing

## 2023-09-12 NOTE — Progress Notes (Signed)
   09/12/23 0853  TOC Brief Assessment  Insurance and Status Reviewed  Patient has primary care physician Yes  Home environment has been reviewed Single family home  Prior level of function: Independent/modified independent  Prior/Current Home Services No current home services  Social Determinants of Health Reivew SDOH reviewed no interventions necessary  Readmission risk has been reviewed Yes  Transition of care needs no transition of care needs at this time

## 2023-09-12 NOTE — Discharge Summary (Signed)
Physician Discharge Summary   Patient: Bridget Lamb MRN: [id number] DOB: [birth year]  Admit date:     09/10/2023  Discharge date: 09/12/23  Discharge Physician: Marguerita Merles, DO   PCP: Dani Gobble, PA-C   Recommendations at discharge:  {Tip this will not be part of the note when signed- Example include specific recommendations for outpatient follow-up, pending tests to follow-up on. (Optional):26781}  ***  Discharge Diagnoses: Principal Problem:   AKI (acute kidney injury) (HCC)  Resolved Problems:   * No resolved hospital problems. [facility] Course: HPI per Dr. Buena Irish on 09/10/23  Bridget Lamb is a 67 y.o. female with medical history significant endometrial adenocarcinoma grade 1, OSA on CPAP, obesity, asthma, hyperlipidemia, hypertension, and CKD stage IIIa who presented to the emergency department today with his severe back pain.  4 days ago the patient had a violent sneeze.  After that time she had a severe In her right side.  The following day the pain moved into her upper back across her shoulder blades and then it settled into her lower back.  The pain moved around but it was very severe and the patient was having trouble standing up and walking.  She started to feel dizzy with standing.  She had the severe pain when trying to get up and then she got really dizzy.  She was worried so she came into the emergency department.   In the emergency department the patient actually had a CT of her chest abdomen and pelvis to rule out dissection.  No dissection was found no pneumonia or any other significant findings were found.  It is felt that perhaps she just pulled a muscle after her cough.  She may have a bronchitis as well. She did require narcotics for her back pain.  She will be admitted to the hospitalist service for pain control.  Her postural dizziness is felt to be secondary to some mild orthostatic hypotension as the patient was being  aggressively diuresed as an outpatient.  She did have mild acute kidney injury so she will be given some fluids overnight as she did receive contrast with her CT.  **Interim History  Will resume low-dose IV fluids and repeat orthostatic vital signs in the morning.  Will treat IV antibiotics and continue monitor renal function carefully.  Have added more pain control with lidocaine patch, IV fentanyl and K-pad.  Anticipating discharge in next 24 to 48 hours if she going fairly well.  Assessment and Plan:  Cough, with Pneumonia ruled out but suspect Bronchitis  -Placed in Obs Telemetry -CXR done and showed "Bibasilar patchy opacities, which may represent atelectasis, aspiration, or pneumonia. Trace blunting of the bilateral costophrenic angles, which may reflect pleural effusions. Similar mild cardiomegaly." -CT Chest/Abd/Pelvis done and showed "No aortic aneurysm. Unable to evaluate for dissection on this noncontrast study. No acute intrathoracic abnormality. Colonic diverticulosis with no acute diverticulitis. Aortic Atherosclerosis (ICD10-I70.0) including four-vessel coronary calcification." And CTA Chest/Abd/Pelvis done and showed "No evidence of aortic dissection or aneurysmal dilatation. The remainder of the exam is stable from the noncontrast study obtained 3 hours previous." -CT Scan's mentioned no Pneumonia and Aortic Dissection ruled out -COVID and -WBC Trend: Recent Labs  Lab 09/10/23 1342 09/11/23 0547 09/12/23 0529 09/12/23 1159  WBC 15.8* 11.3* 13.5* 13.7*  -C/w IV Ceftriaxone and po Azithromycin -C/w DuoNeb q4hprn Wheezing and SOB and add Guaifensin 1200 mg po BID, Flutter Valve, and Incentive Spirometry  -Repeat CXR in the AM  Severe Back Upper Back Pain -C/w Analgesics with as needed oxycodone 2.5 mg every 6 as needed for moderate pain and also add back IV fentanyl 25 mcg every 2 hours as needed severe pain -Also add lidocaine patch 1 patch transdermally q. 24 and add  K-pad as well -Add pain that moved in her upper back across her shoulder blades and then subsequently radiated to her lower back  Dizziness -Likely in setting of pain but will need to check orthostatics -Did have some possible dizziness thought to the setting of orthostatic hypotension with mild given that she had been aggressively diuresed in outpatient setting -Check orthostatics in the a.m. -No longer on Maintenance IVF and Fluids discontinued  -TSH Was 0.469  Grade 1 Endometrial Adenocarcinoma  -Follow up with Oncology in the outpatient setting   AKI on CKD Stage 3a -BUN/Cr Trend: Recent Labs  Lab 09/10/23 1342 09/11/23 0547 09/12/23 0529  BUN 90* 87* 94*  CREATININE 2.05* 1.88* 2.08*  -IVF now discontinued; continue holding Metolazone and Torsemide for now -Avoid Nephrotoxic Medications, Contrast Dyes, Hypotension and Dehydration to Ensure Adequate Renal Perfusion and will need to Renally Adjust Meds -Continue to Monitor and Trend Renal Function carefully and repeat CMP in the AM   Normocytic Anemia -Hgb/Hct Trend: Recent Labs  Lab 09/10/23 1342 09/11/23 0547 09/12/23 0529 09/12/23 1159  HGB 9.6* 10.1* 9.7* 9.7*  HCT 29.3* 31.3* 31.9* 29.8*  MCV 95.1 97.5 102.2* 99.0  -Check Anemia Panel in the AM -Continue to Monitor for S/Sx of bleeding; No overt bleeding noted -Repeat CBC in the AM   Hypokalemia -Patient's K+ Level Trend: Recent Labs  Lab 09/10/23 1342 09/11/23 0547 09/12/23 0529  K 3.4* 3.1* 4.9  -Replete with IV Kcl 20 mEQ and po KCL 40 mEQ BID x -Continue to Monitor and Replete as Necessary -Repeat CMP in the AM   OSA -C/w CPAP with home nasal pillows  Morbid Obesity -Complicates overall prognosis and care -Estimated body mass index is 43.77 kg/m as calculated from the following:   Height as of this encounter: 5\' 4"  (1.626 m).   Weight as of this encounter: 115.7 kg.  -Weight Loss and Dietary Counseling given   Assessment and Plan: No notes  have been filed under this hospital service. Service: Hospitalist     {Tip this will not be part of the note when signed Body mass index is 43.77 kg/m. , ,  (Optional):26781}  {(NOTE) Pain control PDMP Statment (Optional):26782} Consultants: *** Procedures performed: ***  Disposition: {Plan; Disposition:26390} Diet recommendation:  Discharge Diet Orders (From admission, onward)     Start     Ordered   09/12/23 0000  Diet - low sodium heart healthy        09/12/23 1711           {Diet_Plan:26776} DISCHARGE MEDICATION: Allergies as of 09/12/2023       Reactions   Procardia [nifedipine] Other (See Comments)   Ineffective         Medication List     STOP taking these medications    predniSONE 10 MG (21) Tbpk tablet Commonly known as: STERAPRED UNI-PAK 21 TAB       TAKE these medications    acetaminophen 325 MG tablet Commonly known as: TYLENOL Take 2 tablets (650 mg total) by mouth every 6 (six) hours as needed for mild pain (pain score 1-3) (or Fever >/= 101).   albuterol 108 (90 Base) MCG/ACT inhaler Commonly known as: VENTOLIN HFA Inhale 2 puffs  into the lungs every 4 (four) hours as needed for wheezing or shortness of breath.   azithromycin 500 MG tablet Commonly known as: ZITHROMAX Take 1 tablet (500 mg total) by mouth daily for 5 days.   CALCIUM + D3 PO Take 1 tablet by mouth 2 (two) times daily.   cefdinir 300 MG capsule Commonly known as: OMNICEF Take 1 capsule (300 mg total) by mouth 2 (two) times daily for 5 days.   diclofenac Sodium 1 % Gel Commonly known as: VOLTAREN Apply 2 g topically 4 (four) times daily.   guaiFENesin 600 MG 12 hr tablet Commonly known as: MUCINEX Take 1 tablet (600 mg total) by mouth 2 (two) times daily as needed for up to 5 days.   Integra Plus Caps Take 1 capsule by mouth daily.   levothyroxine 125 MCG tablet Commonly known as: SYNTHROID Take 125 mcg by mouth daily before breakfast.   lidocaine 5  % Commonly known as: LIDODERM Place 1 patch onto the skin daily. Remove & Discard patch within 12 hours or as directed by MD   loratadine 10 MG tablet Commonly known as: CLARITIN Take 10 mg by mouth daily.   metolazone 2.5 MG tablet Commonly known as: ZAROXOLYN Take 1 tablet (2.5 mg total) by mouth as directed. Tuesday, Thursday, Saturday What changed:  when to take this additional instructions   olopatadine 0.1 % ophthalmic solution Commonly known as: PATANOL Place 1 drop into both eyes 2 (two) times daily.   ondansetron 4 MG tablet Commonly known as: ZOFRAN Take 1 tablet (4 mg total) by mouth every 6 (six) hours as needed for nausea.   oxyCODONE 5 MG immediate release tablet Commonly known as: Oxy IR/ROXICODONE Take 1 tablet (5 mg total) by mouth every 6 (six) hours as needed for up to 3 days for moderate pain (pain score 4-6).   rosuvastatin 10 MG tablet Commonly known as: CRESTOR Take 10 mg by mouth at bedtime.   senna-docusate 8.6-50 MG tablet Commonly known as: Senokot-S Take 1 tablet by mouth 2 (two) times daily as needed for moderate constipation.   sodium chloride 0.65 % Soln nasal spray Commonly known as: OCEAN Place 1 spray into both nostrils as needed for congestion.   torsemide 20 MG tablet Commonly known as: DEMADEX Take 1 tablet (20 mg total) by mouth 2 (two) times daily. 1-2 pills daily as needed for leg edema What changed:  when to take this additional instructions   valsartan 40 MG tablet Commonly known as: DIOVAN Take 40 mg by mouth daily.        Discharge Exam: Filed Weights   09/10/23 1228  Weight: 115.7 kg   ***  Condition at discharge: {DC Condition:26389}  The results of significant diagnostics from this hospitalization (including imaging, microbiology, ancillary and laboratory) are listed below for reference.   Imaging Studies: DG Lumbar Spine 2-3 Views  Result Date: 09/12/2023 CLINICAL DATA:  Back pain EXAM: LUMBAR SPINE -  2-3 VIEW COMPARISON:  09/10/2023 FINDINGS: There is no evidence of lumbar spine fracture. Minimal grade 1 anterolisthesis of L4 on L5. Intervertebral disc spaces are maintained. Mild lower lumbar facet arthropathy. Aortoiliac atherosclerosis. IMPRESSION: Mild lower lumbar facet arthropathy. No acute findings. Electronically Signed   By: Duanne Guess D.O.   On: 09/12/2023 16:59   DG CHEST PORT 1 VIEW  Result Date: 09/12/2023 CLINICAL DATA:  Shortness of breath. EXAM: PORTABLE CHEST 1 VIEW COMPARISON:  September 10, 2023. FINDINGS: Stable cardiomegaly. Both lungs are clear. The  visualized skeletal structures are unremarkable. IMPRESSION: No active disease. Electronically Signed   By: Lupita Raider M.D.   On: 09/12/2023 09:56   CT Angio Chest/Abd/Pel for Dissection W and/or Wo Contrast  Result Date: 09/10/2023 CLINICAL DATA:  Chest pain following sneezing episode, initial encounter EXAM: CT ANGIOGRAPHY CHEST, ABDOMEN AND PELVIS TECHNIQUE: Non-contrast CT of the chest was initially obtained. Multidetector CT imaging through the chest, abdomen and pelvis was performed using the standard protocol during bolus administration of intravenous contrast. Multiplanar reconstructed images and MIPs were obtained and reviewed to evaluate the vascular anatomy. RADIATION DOSE REDUCTION: This exam was performed according to the departmental dose-optimization program which includes automated exposure control, adjustment of the mA and/or kV according to patient size and/or use of iterative reconstruction technique. CONTRAST:  OMNIPAQUE IOHEXOL 350 MG/ML SOLN COMPARISON:  Noncontrast study obtained earlier in the same day. FINDINGS: CTA CHEST FINDINGS Cardiovascular: Initial precontrast study shows no hyperdense crescent to suggest acute aortic injury. Post-contrast images show mild atherosclerotic calcifications of the thoracic aorta. No aneurysmal dilatation or dissection is seen. No cardiac enlargement is noted.  Minimal coronary calcifications are seen. The pulmonary artery shows a normal branching pattern bilaterally. No filling defect to suggest pulmonary embolism is noted. Mediastinum/Nodes: Thoracic inlet is within normal limits. No hilar or mediastinal adenopathy is noted. The esophagus as visualized is within normal limits. Lungs/Pleura: The lungs are well aerated bilaterally. No focal infiltrate or effusion is seen. No parenchymal nodules are seen. Musculoskeletal: Degenerative changes of the thoracic spine are noted. No rib abnormality is noted. Review of the MIP images confirms the above findings. CTA ABDOMEN AND PELVIS FINDINGS VASCULAR Aorta: Abdominal aorta demonstrates atherosclerotic calcifications without aneurysmal dilatation or evidence of dissection. Celiac: Patent without evidence of aneurysm, dissection, vasculitis or significant stenosis. SMA: Patent without evidence of aneurysm, dissection, vasculitis or significant stenosis. Renals: Both renal arteries are patent without evidence of aneurysm, dissection, vasculitis, fibromuscular dysplasia or significant stenosis. IMA: Patent without evidence of aneurysm, dissection, vasculitis or significant stenosis. Inflow: Iliac show atherosclerotic calcification without aneurysmal dilatation or stenosis. Veins: No specific venous abnormality is noted. Review of the MIP images confirms the above findings. NON-VASCULAR Hepatobiliary: No focal liver abnormality is seen. Status post cholecystectomy. No biliary dilatation. Pancreas: Unremarkable. No pancreatic ductal dilatation or surrounding inflammatory changes. Spleen: Normal in size without focal abnormality. Adrenals/Urinary Tract: Adrenal glands are unremarkable. Kidneys are normal, without renal calculi, focal lesion, or hydronephrosis. Bladder is unremarkable. Stomach/Bowel: Scattered diverticular changes noted within the sigmoid colon. No obstructive or inflammatory changes of the colon are noted. The  appendix is not well visualized. No inflammatory changes to suggest appendicitis are noted. Small bowel and stomach are within normal limits. Lymphatic: No significant adenopathy is noted. Reproductive: Status post hysterectomy. No adnexal masses. Other: No abdominal wall hernia or abnormality. No abdominopelvic ascites. Musculoskeletal: No acute or significant osseous findings. Review of the MIP images confirms the above findings. IMPRESSION: No evidence of aortic dissection or aneurysmal dilatation. The remainder of the exam is stable from the noncontrast study obtained 3 hours previous. Electronically Signed   By: Alcide Clever M.D.   On: 09/10/2023 19:13   CT CHEST ABDOMEN PELVIS WO CONTRAST  Result Date: 09/10/2023 CLINICAL DATA:  Aortic aneurysm suspected Sneezed last night and felt pain across back. Continued pain, history of cholecystectomy, ventral hernia repair, CKD, CHF. EXAM: CT CHEST, ABDOMEN AND PELVIS WITHOUT CONTRAST TECHNIQUE: Multidetector CT imaging of the chest, abdomen and pelvis was performed following  the standard protocol without IV contrast. RADIATION DOSE REDUCTION: This exam was performed according to the departmental dose-optimization program which includes automated exposure control, adjustment of the mA and/or kV according to patient size and/or use of iterative reconstruction technique. COMPARISON:  None Available. FINDINGS: CT CHEST FINDINGS Cardiovascular: Normal heart size. No significant pericardial effusion. The thoracic aorta is normal in caliber. Moderate atherosclerotic plaque of the thoracic aorta. Three-vessel coronary artery calcifications. Mediastinum/Nodes: No gross hilar adenopathy, noting limited sensitivity for the detection of hilar adenopathy on this noncontrast study. No enlarged mediastinal or axillary lymph nodes. Thyroid gland, trachea, and esophagus demonstrate no significant findings. Lungs/Pleura: No focal consolidation. No pulmonary nodule. No pulmonary  mass. No pleural effusion. No pneumothorax. Musculoskeletal: No chest wall abnormality. No suspicious lytic or blastic osseous lesions. No acute displaced fracture. Multilevel degenerative changes of the spine. CT ABDOMEN PELVIS FINDINGS Hepatobiliary: No focal liver abnormality. Status post cholecystectomy. No biliary dilatation. Pancreas: No focal lesion. Normal pancreatic contour. No surrounding inflammatory changes. No main pancreatic ductal dilatation. Spleen: Normal in size.  Subcentimeter hypodensity indeterminate. Adrenals/Urinary Tract: No adrenal nodule bilaterally. No nephrolithiasis and no hydronephrosis. No definite contour-deforming renal mass. No ureterolithiasis or hydroureter. The urinary bladder is unremarkable. Stomach/Bowel: Stomach is within normal limits. No evidence of bowel wall thickening or dilatation. Colonic diverticulosis. The appendix is not definitely identified with no inflammatory changes in the right lower quadrant to suggest acute appendicitis. Vascular/Lymphatic: No abdominal aorta or iliac aneurysm. Severe atherosclerotic plaque of the aorta and its branches. No abdominal, pelvic, or inguinal lymphadenopathy. Reproductive: Status post hysterectomy. No adnexal masses. Other: Trace nonspecific simple intraperitoneal free fluid. No intraperitoneal free gas. No organized fluid collection. Musculoskeletal: Ventral wall hernia repair surgical changes. No abdominal wall hernia or abnormality. Diastasis rectus. No suspicious lytic or blastic osseous lesions. No acute displaced fracture. Multilevel degenerative changes of the spine. IMPRESSION: 1. No aortic aneurysm. Unable to evaluate for dissection on this noncontrast study. 2. No acute intrathoracic abnormality. 3. Colonic diverticulosis with no acute diverticulitis. 4. Aortic Atherosclerosis (ICD10-I70.0) including four-vessel coronary calcification. Electronically Signed   By: Tish Frederickson M.D.   On: 09/10/2023 15:41   DG Chest  Portable 1 View  Result Date: 09/10/2023 CLINICAL DATA:  Hypoxia and hypotension EXAM: PORTABLE CHEST 1 VIEW COMPARISON:  Chest radiograph dated 02/27/2021 FINDINGS: Surgical clips project over the neck. Normal lung volumes. Bibasilar patchy opacities. Trace blunting of the bilateral costophrenic angles. No pneumothorax. Similar mildly enlarged cardiomediastinal silhouette. No acute osseous abnormality. IMPRESSION: 1. Bibasilar patchy opacities, which may represent atelectasis, aspiration, or pneumonia. 2. Trace blunting of the bilateral costophrenic angles, which may reflect pleural effusions. 3. Similar mild cardiomegaly. Electronically Signed   By: Agustin Cree M.D.   On: 09/10/2023 14:06    Microbiology: Results for orders placed or performed during the hospital encounter of 09/10/23  Resp panel by RT-PCR (RSV, Flu A&B, Covid) Anterior Nasal Swab     Status: None   Collection Time: 09/10/23  7:28 PM   Specimen: Anterior Nasal Swab  Result Value Ref Range Status   SARS Coronavirus 2 by RT PCR NEGATIVE NEGATIVE Final    Comment: (NOTE) SARS-CoV-2 target nucleic acids are NOT DETECTED.  The SARS-CoV-2 RNA is generally detectable in upper respiratory specimens during the acute phase of infection. The lowest concentration of SARS-CoV-2 viral copies this assay can detect is 138 copies/mL. A negative result does not preclude SARS-Cov-2 infection and should not be used as the sole basis for treatment or  other patient management decisions. A negative result may occur with  improper specimen collection/handling, submission of specimen other than nasopharyngeal swab, presence of viral mutation(s) within the areas targeted by this assay, and inadequate number of viral copies(<138 copies/mL). A negative result must be combined with clinical observations, patient history, and epidemiological information. The expected result is Negative.  Fact Sheet for Patients:   BloggerCourse.com  Fact Sheet for Healthcare Providers:  SeriousBroker.it  This test is no t yet approved or cleared by the [place] FDA and  has been authorized for detection and/or diagnosis of SARS-CoV-2 by FDA under an Emergency Use Authorization (EUA). This EUA will remain  in effect (meaning this test can be used) for the duration of the COVID-19 declaration under Section 564(b)(1) of the Act, 21 U.S.C.section 360bbb-3(b)(1), unless the authorization is terminated  or revoked sooner.       Influenza A by PCR NEGATIVE NEGATIVE Final   Influenza B by PCR NEGATIVE NEGATIVE Final    Comment: (NOTE) The Xpert Xpress SARS-CoV-2/FLU/RSV plus assay is intended as an aid in the diagnosis of influenza from Nasopharyngeal swab specimens and should not be used as a sole basis for treatment. Nasal washings and aspirates are unacceptable for Xpert Xpress SARS-CoV-2/FLU/RSV testing.  Fact Sheet for Patients: BloggerCourse.com  Fact Sheet for Healthcare Providers: SeriousBroker.it  This test is not yet approved or cleared by the [place] FDA and has been authorized for detection and/or diagnosis of SARS-CoV-2 by FDA under an Emergency Use Authorization (EUA). This EUA will remain in effect (meaning this test can be used) for the duration of the COVID-19 declaration under Section 564(b)(1) of the Act, 21 U.S.C. section 360bbb-3(b)(1), unless the authorization is terminated or revoked.     Resp Syncytial Virus by PCR NEGATIVE NEGATIVE Final    Comment: (NOTE) Fact Sheet for Patients: BloggerCourse.com  Fact Sheet for Healthcare Providers: SeriousBroker.it  This test is not yet approved or cleared by the [place] FDA and has been authorized for detection and/or diagnosis of SARS-CoV-2 by FDA under an Emergency Use  Authorization (EUA). This EUA will remain in effect (meaning this test can be used) for the duration of the COVID-19 declaration under Section 564(b)(1) of the Act, 21 U.S.C. section 360bbb-3(b)(1), unless the authorization is terminated or revoked.  Performed at [facility], [address] [place], [place] [address]     Labs: CBC: Recent Labs  Lab 09/10/23 1342 09/11/23 0547 09/12/23 0529 09/12/23 1159  WBC 15.8* 11.3* 13.5* 13.7*  NEUTROABS 14.1*  --  11.9* 11.8*  HGB 9.6* 10.1* 9.7* 9.7*  HCT 29.3* 31.3* 31.9* 29.8*  MCV 95.1 97.5 102.2* 99.0  PLT 234 224 228 240   Basic Metabolic Panel: Recent Labs  Lab 09/10/23 1342 09/11/23 0547 09/12/23 0529  NA 136 137 136  K 3.4* 3.1* 4.9  CL 90* 94* 96*  CO2 33* 30 29  GLUCOSE 108* 171* 109*  BUN 90* 87* 94*  CREATININE 2.05* 1.88* 2.08*  CALCIUM 8.6* 8.7* 8.7*  MG  --  2.2 2.3  PHOS  --  4.4 4.8*   Liver Function Tests: Recent Labs  Lab 09/10/23 1342 09/12/23 0529  AST 12* 18  ALT 15 20  ALKPHOS 61 56  BILITOT 0.8 0.4  PROT 7.4 7.1  ALBUMIN 4.1 3.1*   CBG: No results for input(s): "GLUCAP" in the last 168 hours.  Discharge time spent: {LESS THAN/GREATER MVHQ:46962} 30 minutes.  Signed: Merlene Laughter, DO [facility] 09/12/2023

## 2023-10-06 ENCOUNTER — Encounter: Payer: Self-pay | Admitting: Cardiology

## 2023-10-06 ENCOUNTER — Ambulatory Visit: Payer: BC Managed Care – PPO | Attending: Cardiology | Admitting: Cardiology

## 2023-10-06 VITALS — BP 116/52 | HR 81 | Ht 64.0 in | Wt 249.2 lb

## 2023-10-06 DIAGNOSIS — I5032 Chronic diastolic (congestive) heart failure: Secondary | ICD-10-CM

## 2023-10-06 DIAGNOSIS — I10 Essential (primary) hypertension: Secondary | ICD-10-CM

## 2023-10-06 NOTE — Patient Instructions (Signed)
Medication Instructions:   Your physician recommends that you continue on your current medications as directed. Please refer to the Current Medication list given to you today.  *If you need a refill on your cardiac medications before your next appointment, please call your pharmacy*   Lab Work:  TODAY--BMET AND PRO-BNP--GO DOWNSTAIRS [facility] OF THIS BUILDING TO HAVE DRAWN (FIRST FLOOR)  If you have labs (blood work) drawn today and your tests are completely normal, you will receive your results only by: MyChart Message (if you have MyChart) OR A paper copy in the mail If you have any lab test that is abnormal or we need to change your treatment, we will call you to review the results.    Follow-Up:  3 MONTHS WITH DR. PATWARDHAN OR AN EXTENDER

## 2023-10-06 NOTE — Progress Notes (Signed)
  Cardiology Office Note:  .   Date:  10/06/2023  ID:  Bridget Lamb, DOB 15-Jul-1956, MRN [id number] PCP: Dani Gobble, PA-C  [facility] Providers Cardiologist:  Truett Mainland, MD PCP: Dani Gobble, PA-C  Chief Complaint  Patient presents with   HFpEF      History of Present Illness: .    Bridget Lamb is a 68 y.o. female with hypertension, hyperlipidemia, obesity, OSA, HFpEF, asthma, CKD stage 3a/b, h/o endometrial cancer   Patient was hospitalized 08/2023 with cough, pneumonia, AKI.  Breathing has improved since then.  Negative mild results under control.  She denies any chest pain.  Blood pressure is normal today.  Vitals:   10/06/23 0834  BP: (!) 116/52  Pulse: 81  SpO2: 99%     ROS:  Review of Systems  Cardiovascular:  Positive for dyspnea on exertion. Negative for chest pain, leg swelling, palpitations and syncope.     Studies Reviewed: Marland Kitchen        Independently interpreted 08/2023: Hb 9.7 Cr 2.08, eGFR 25   Physical Exam:   Physical Exam Vitals and nursing note reviewed.  Constitutional:      General: She is not in acute distress. Neck:     Vascular: No JVD.  Cardiovascular:     Rate and Rhythm: Normal rate and regular rhythm.     Heart sounds: Normal heart sounds. No murmur heard. Pulmonary:     Effort: Pulmonary effort is normal.     Breath sounds: Normal breath sounds. No wheezing or rales.  Musculoskeletal:     Right lower leg: Edema (Trace, prominent varicosities) present.     Left lower leg: Edema (Trace, prominent varicosities) present.      VISIT DIAGNOSES:   ICD-10-CM   1. Chronic heart failure with preserved ejection fraction (HCC)  I50.32 Basic metabolic panel    Pro b natriuretic peptide    Pro b natriuretic peptide    Basic metabolic panel    2. Primary hypertension  I10 Basic metabolic panel    Pro b natriuretic peptide    Pro b natriuretic peptide    Basic metabolic panel        ASSESSMENT AND PLAN: .    Bridget Lamb is a 67 y.o. female with hypertension, hyperlipidemia, obesity, OSA, HFpEF, asthma, CKD stage 3a/b, h/o endometrial cancer    HFpEF: EF 55-60%, grade II DD, RVSP 54 mmHg. NYHA II dyspnea symptoms, but improved since pneumonia hospitalization in 08/2023. Continue valsartan 40 mg daily, metolazone 2.5 mg every other day, torsemide 40 to 80 mg daily. Check BMP, proBNP today.  AKI/CKD: Noted during hospitalization in 08/2023. Check BMP today.  Hypertension: Well controlled.          No orders of the defined types were placed in this encounter.    F/u in 3 months  Signed, Elder Negus, MD

## 2023-10-07 LAB — BASIC METABOLIC PANEL
BUN/Creatinine Ratio: 31 — ABNORMAL HIGH (ref 12–28)
BUN: 65 mg/dL — ABNORMAL HIGH (ref 8–27)
CO2: 25 mmol/L (ref 20–29)
Calcium: 9.6 mg/dL (ref 8.7–10.3)
Chloride: 101 mmol/L (ref 96–106)
Creatinine, Ser: 2.12 mg/dL — ABNORMAL HIGH (ref 0.57–1.00)
Glucose: 101 mg/dL — ABNORMAL HIGH (ref 70–99)
Potassium: 4.4 mmol/L (ref 3.5–5.2)
Sodium: 144 mmol/L (ref 134–144)
eGFR: 25 mL/min/{1.73_m2} — ABNORMAL LOW (ref >59)

## 2023-10-07 LAB — PRO B NATRIURETIC PEPTIDE: NT-Pro BNP: 993 pg/mL — ABNORMAL HIGH (ref 0–301)

## 2023-10-07 NOTE — Progress Notes (Signed)
Kidney function and marker of congestive heart failure remained about the same. Given that creatinine is staying elevated, referred to nephrology, if not already seeing 1.  Thanks MJP

## 2024-01-04 NOTE — Progress Notes (Unsigned)
  Cardiology Office Note:  .   Date:  01/04/2024  ID:  Bridget Lamb, DOB April 04, 1956, MRN [id number] PCP: Dani Gobble, PA-C  [facility] Providers Cardiologist:  Truett Mainland, MD PCP: Dani Gobble, PA-C  No chief complaint on file.     History of Present Illness: .    Bridget Lamb is a 68 y.o. female with hypertension, hyperlipidemia, obesity, OSA, HFpEF, asthma, CKD stage 3a/b, h/o endometrial cancer   *** Patient was hospitalized 08/2023 with cough, pneumonia, AKI.  Breathing has improved since then.  Negative mild results under control.  She denies any chest pain.  Blood pressure is normal today.  There were no vitals filed for this visit.    ROS:  Review of Systems  Cardiovascular:  Positive for dyspnea on exertion. Negative for chest pain, leg swelling, palpitations and syncope.     Studies Reviewed: Marland Kitchen        Independently interpreted 08/2023: Hb 9.7 Cr 2.08, eGFR 25  Independently interpreted 09/2023: HbA1C ***% Hb 9.7 Cr 2.12, eGFR 25 NT-ProBNP 993 TSH 0.46  02/2021: Chol 117, TG 54, HDL 49, LDL 57  Echocardiogram 10/2022: Poor echo window. Wall motion abnormality at reduced sensitivity. Left ventricle cavity is normal in size. Normal left ventricular wall thickness. Hypokinetic global wall motion. Doppler evidence of grade II (pseudonormal) diastolic dysfunction, elevated LAP. Normal LV systolic function with visual EF 55-60%. Calculated EF 52%. Left atrial cavity is moderately dilated. Structurally normal mitral valve.  Mild (Grade I) mitral regurgitation. E-wave dominant mitral inflow. Structurally normal tricuspid valve.  Mild tricuspid regurgitation. Moderate pulmonary hypertension. RVSP measures 59 mmHg. IVC is dilated with poor inspiration collapse consistent with elevated right atrial pressure. Compared to the study done on 03/01/2021, no significant change.    Physical Exam:   Physical Exam Vitals  and nursing note reviewed.  Constitutional:      General: She is not in acute distress. Neck:     Vascular: No JVD.  Cardiovascular:     Rate and Rhythm: Normal rate and regular rhythm.     Heart sounds: Normal heart sounds. No murmur heard. Pulmonary:     Effort: Pulmonary effort is normal.     Breath sounds: Normal breath sounds. No wheezing or rales.  Musculoskeletal:     Right lower leg: Edema (Trace, prominent varicosities) present.     Left lower leg: Edema (Trace, prominent varicosities) present.      VISIT DIAGNOSES: No diagnosis found.    ASSESSMENT AND PLAN: .    Bridget Lamb is a 68 y.o. female with hypertension, hyperlipidemia, obesity, OSA, HFpEF, asthma, CKD stage 3a/b, h/o endometrial cancer   *** HFpEF: EF 55-60%, grade II DD, RVSP 54 mmHg. NYHA II dyspnea symptoms, but improved since pneumonia hospitalization in 08/2023. Continue valsartan 40 mg daily, metolazone 2.5 mg every other day, torsemide 40 to 80 mg daily. Check BMP, proBNP today.  *** AKI/CKD: Noted during hospitalization in 08/2023. Check BMP today.  *** Hypertension: Well controlled.          No orders of the defined types were placed in this encounter.    F/u in 3 months  Signed, Elder Negus, MD

## 2024-01-05 ENCOUNTER — Ambulatory Visit: Payer: BC Managed Care – PPO | Attending: Cardiology | Admitting: Cardiology

## 2024-01-05 ENCOUNTER — Encounter: Payer: Self-pay | Admitting: Cardiology

## 2024-01-05 VITALS — BP 120/70 | HR 75 | Ht 64.0 in | Wt 246.8 lb

## 2024-01-05 DIAGNOSIS — I10 Essential (primary) hypertension: Secondary | ICD-10-CM | POA: Diagnosis not present

## 2024-01-05 DIAGNOSIS — I5032 Chronic diastolic (congestive) heart failure: Secondary | ICD-10-CM

## 2024-01-05 MED ORDER — TORSEMIDE 20 MG PO TABS: 20.0000 mg | ORAL_TABLET | Freq: Every day | ORAL | 1 refills | Status: DC

## 2024-01-05 NOTE — Patient Instructions (Signed)
 Medication Instructions:   TAKE TORSEMIDE 20 MG BY MOUTH DAILY  *If you need a refill on your cardiac medications before your next appointment, please call your pharmacy*   Lab Work:  SAME DAY AS YOU COME IN FOR YOUR ECHO- AT A [facility]--BMET, CBC, AND PRO-BNP  If you have labs (blood work) drawn today and your tests are completely normal, you will receive your results only by: MyChart Message (if you have MyChart) OR A paper copy in the mail If you have any lab test that is abnormal or we need to change your treatment, we will call you to review the results.   Testing/Procedures:  Your physician has requested that you have an echocardiogram. Echocardiography is a painless test that uses sound waves to create images of your heart. It provides your doctor with information about the size and shape of your heart and how well your heart's chambers and valves are working. This procedure takes approximately one hour. There are no restrictions for this procedure.  HAVE LABS DONE SAME DAY AS THIS APPOINTMENT  Please do NOT wear cologne, perfume, aftershave, or lotions (deodorant is allowed). Please arrive 15 minutes prior to your appointment time.  Please note: We ask at that you not bring children with you during ultrasound (echo/ vascular) testing. Due to room size and safety concerns, children are not allowed in the ultrasound rooms during exams. Our front office staff cannot provide observation of children in our lobby area while testing is being conducted. An adult accompanying a patient to their appointment will only be allowed in the ultrasound room at the discretion of the ultrasound technician under special circumstances. We apologize for any inconvenience.    Follow-Up:  3 MONTHS WITH DR. PATWARDHAN  Other Instructions  [facility] Contact for Alternative locations and appointment scheduling   [email or web address]   [email or web address]  [phone]

## 2024-01-05 NOTE — Addendum Note (Signed)
 Addended by: Loa Socks on: 01/05/2024 09:33 AM   Modules accepted: Orders

## 2024-01-18 NOTE — Progress Notes (Signed)
 Radiation Oncology         [phone] ________________________________  Name: BARBIE CROSTON MRN: [id number]  Date: 01/19/2024  DOB: 1956-10-07  Follow-Up Visit Note  CC: Dani Gobble, PA-C  Dani Gobble, P*  No diagnosis found.  Diagnosis: Recurrent FIGO stage IA (cT1a, cN0, cM0) endometrial adenocarcinoma, grade 1   Interval Since Last Radiation:  3 years, 5 months, and 28 days    Intent: Curative  Radiation Treatment Dates: 05/28/2020 through 07/24/2020 Site Technique Total Dose (Gy) Dose per Fx (Gy) Completed Fx Beam Energies  Pelvis: Pelvis IMRT 45/45 1.8 25/25 6X  Vagina: Pelvis_Bst HDR-brachy 24/24 6 4/4 Ir-192     Narrative:  The patient returns today for routine follow-up. She was last seen in office on 01-20-23 for a routine follow up. She continued to follow up with her specialists to manage her chronic conditions.      Patient presented for a follow up with Dr. Pricilla Holm on 07-07-23 during which she reported feeling well overall and was noted NED on examination.    She was hospitalized from 11/16-11/18/24 with complains of severe lower back pain and dizziness. CT of her chest abdomen and pelvis showed no dissection, pneumonia or any other significant findings. CTA showed no evidence of aortic dissection or aneurysmal dilatation. Lumbar spine X-ray showed mild lower lumbar facet arthropathy with no acute findings. Chest x-ray showed no evidence of active disease.   No other significant oncologic interval history since the patient was last seen.                     Allergies:  is allergic to procardia [nifedipine].  Meds: Current Outpatient Medications  Medication Sig Dispense Refill   acetaminophen (TYLENOL) 325 MG tablet Take 2 tablets (650 mg total) by mouth every 6 (six) hours as needed for mild pain (pain score 1-3) (or Fever >/= 101). 20 tablet 0   albuterol (PROVENTIL HFA;VENTOLIN HFA) 108 (90 Base) MCG/ACT inhaler Inhale 2 puffs into the lungs  every 4 (four) hours as needed for wheezing or shortness of breath. 1 Inhaler 0   Calcium Carb-Cholecalciferol (CALCIUM + D3 PO) Take 1 tablet by mouth 2 (two) times daily.     diclofenac Sodium (VOLTAREN) 1 % GEL Apply 2 g topically 4 (four) times daily. 50 g 0   FeFum-FePoly-FA-B Cmp-C-Biot (INTEGRA PLUS) CAPS Take 1 capsule by mouth daily.     levothyroxine (SYNTHROID) 125 MCG tablet Take 125 mcg by mouth daily before breakfast.     lidocaine (LIDODERM) 5 % Place 1 patch onto the skin daily. Remove & Discard patch within 12 hours or as directed by MD 30 patch 0   loratadine (CLARITIN) 10 MG tablet Take 10 mg by mouth daily.     metolazone (ZAROXOLYN) 2.5 MG tablet Take 1 tablet (2.5 mg total) by mouth as directed. Tuesday, Thursday, Saturday (Patient taking differently: Take 2.5 mg by mouth See admin instructions. Take 2.5 mg by mouth on Tuesday, Thursday, and Saturday only) 90 tablet 3   olopatadine (PATANOL) 0.1 % ophthalmic solution Place 1 drop into both eyes 2 (two) times daily.      rosuvastatin (CRESTOR) 10 MG tablet Take 10 mg by mouth at bedtime.     senna-docusate (SENOKOT-S) 8.6-50 MG tablet Take 1 tablet by mouth 2 (two) times daily as needed for moderate constipation.     sodium chloride (OCEAN) 0.65 % SOLN nasal spray Place 1 spray into both nostrils as needed for  congestion.     torsemide (DEMADEX) 20 MG tablet Take 1 tablet (20 mg total) by mouth daily. 90 tablet 1   valsartan (DIOVAN) 40 MG tablet Take 40 mg by mouth daily.     No current facility-administered medications for this encounter.    Physical Findings: The patient is in no acute distress. Patient is alert and oriented.  vitals were not taken for this visit. .  No significant changes. Lungs are clear to auscultation bilaterally. Heart has regular rate and rhythm. No palpable cervical, supraclavicular, or axillary adenopathy. Abdomen soft, non-tender, normal bowel sounds.   Lab Findings: Lab Results  Component  Value Date   WBC 13.7 (H) 09/12/2023   HGB 9.7 (L) 09/12/2023   HCT 29.8 (L) 09/12/2023   MCV 99.0 09/12/2023   PLT 240 09/12/2023    Radiographic Findings: No results found.  Impression:  Recurrent FIGO stage IA (cT1a, cN0, cM0) endometrial adenocarcinoma, grade 1   The patient is recovering from the effects of radiation.  ***  Plan:  ***   *** minutes of total time was spent for this patient encounter, including preparation, face-to-face counseling with the patient and coordination of care, physical exam, and documentation of the encounter. ____________________________________  Billie Lade, PhD, MD  This document serves as a record of services personally performed by Antony Blackbird, MD. It was created on his behalf by Herbie Saxon, a trained medical scribe. The creation of this record is based on the scribe's personal observations and the provider's statements to them. This document has been checked and approved by the attending provider.

## 2024-01-19 ENCOUNTER — Ambulatory Visit
Admission: RE | Admit: 2024-01-19 | Discharge: 2024-01-19 | Disposition: A | Discharge status: Home / Self Care | Payer: Self-pay | Source: Ambulatory Visit | Attending: Radiation Oncology | Admitting: Radiation Oncology

## 2024-01-19 ENCOUNTER — Encounter: Payer: Self-pay | Admitting: Radiation Oncology

## 2024-01-19 VITALS — BP 143/73 | HR 86 | Temp 97.3°F | Resp 18 | Ht 64.0 in | Wt 247.1 lb

## 2024-01-19 DIAGNOSIS — C541 Malignant neoplasm of endometrium: Secondary | ICD-10-CM

## 2024-01-19 HISTORY — DX: Unspecified osteoarthritis, unspecified site: M19.90

## 2024-01-19 NOTE — Progress Notes (Signed)
 Bridget Lamb is here today for follow up post radiation to the pelvic.  They completed their radiation on: 07/24/2020  Does the patient complain of any of the following:  Pain: She reports arthritis in her vertebrae Abdominal bloating: Denies Diarrhea/Constipation: Denies Nausea/Vomiting: Denies Vaginal Discharge: Denies Blood in Urine or Stool: Denies Urinary Issues (dysuria/incomplete emptying/ incontinence/ increased frequency/urgency): Frequency Does patient report using vaginal dilator 2-3 times a week and/or sexually active 2-3 weeks: She reports not using them often. She reports using the dilators a few weeks ago. Post radiation skin changes: Denies  BP (!) 143/73 (BP Location: Right Leg, Patient Position: Sitting)   Pulse 86   Temp (!) 97.3 F (36.3 C) (Temporal)   Resp 18   Ht 5\' 4"  (1.626 m)   Wt 247 lb 2 oz (112.1 kg)   LMP 10/22/2011   SpO2 100%   BMI 42.42 kg/m     Additional comments if applicable:

## 2024-01-26 LAB — PRO B NATRIURETIC PEPTIDE: NT-Pro BNP: 1671 pg/mL — ABNORMAL HIGH (ref 0–301)

## 2024-01-27 ENCOUNTER — Other Ambulatory Visit: Payer: Self-pay

## 2024-01-27 DIAGNOSIS — I5032 Chronic diastolic (congestive) heart failure: Secondary | ICD-10-CM

## 2024-01-27 LAB — BASIC METABOLIC PANEL WITH GFR
BUN/Creatinine Ratio: 36 — ABNORMAL HIGH (ref 12–28)
BUN: 50 mg/dL — ABNORMAL HIGH (ref 8–27)
CO2: 27 mmol/L (ref 20–29)
Calcium: 9.8 mg/dL (ref 8.7–10.3)
Chloride: 99 mmol/L (ref 96–106)
Creatinine, Ser: 1.4 mg/dL — ABNORMAL HIGH (ref 0.57–1.00)
Glucose: 104 mg/dL — ABNORMAL HIGH (ref 70–99)
Potassium: 4 mmol/L (ref 3.5–5.2)
Sodium: 145 mmol/L — ABNORMAL HIGH (ref 134–144)
eGFR: 41 mL/min/{1.73_m2} — ABNORMAL LOW (ref >59)

## 2024-01-27 LAB — CBC
Hematocrit: 32.2 % — ABNORMAL LOW (ref 34.0–46.6)
Hemoglobin: 10.1 g/dL — ABNORMAL LOW (ref 11.1–15.9)
MCH: 31.2 pg (ref 26.6–33.0)
MCHC: 31.4 g/dL — ABNORMAL LOW (ref 31.5–35.7)
MCV: 99 fL — ABNORMAL HIGH (ref 79–97)
Platelets: 220 10*3/uL (ref 150–450)
RBC: 3.24 x10E6/uL — ABNORMAL LOW (ref 3.77–5.28)
RDW: 13.1 % (ref 11.7–15.4)
WBC: 8.1 10*3/uL (ref 3.4–10.8)

## 2024-01-27 NOTE — Progress Notes (Signed)
 Creatinine has significantly improved, down from 2.1-1.4. proBNP, which is marker of congestion, is increased from 903 months ago, to 1600 now. Recommend increasing torsemide to 40 mg in the morning, 20 mg in the afternoon, continue metolazone 2.5 mg every other day. Continue salt restriction. Repeat BMP and proBNP in 2 weeks.  Thanks MJP

## 2024-02-02 ENCOUNTER — Ambulatory Visit (HOSPITAL_COMMUNITY): Attending: Internal Medicine

## 2024-02-02 DIAGNOSIS — I5032 Chronic diastolic (congestive) heart failure: Secondary | ICD-10-CM | POA: Insufficient documentation

## 2024-02-02 LAB — ECHOCARDIOGRAM COMPLETE
Area-P 1/2: 3.42 cm2
S' Lateral: 3 cm

## 2024-02-03 ENCOUNTER — Encounter: Payer: Self-pay | Admitting: Cardiology

## 2024-03-01 ENCOUNTER — Other Ambulatory Visit: Payer: Self-pay

## 2024-03-01 ENCOUNTER — Emergency Department (HOSPITAL_BASED_OUTPATIENT_CLINIC_OR_DEPARTMENT_OTHER)
Admission: EM | Admit: 2024-03-01 | Discharge: 2024-03-01 | Disposition: A | Discharge status: Home / Self Care | Attending: Emergency Medicine | Admitting: Emergency Medicine

## 2024-03-01 ENCOUNTER — Emergency Department (HOSPITAL_BASED_OUTPATIENT_CLINIC_OR_DEPARTMENT_OTHER)

## 2024-03-01 ENCOUNTER — Emergency Department (HOSPITAL_BASED_OUTPATIENT_CLINIC_OR_DEPARTMENT_OTHER): Admitting: Radiology

## 2024-03-01 ENCOUNTER — Encounter (HOSPITAL_BASED_OUTPATIENT_CLINIC_OR_DEPARTMENT_OTHER): Payer: Self-pay

## 2024-03-01 DIAGNOSIS — M546 Pain in thoracic spine: Secondary | ICD-10-CM | POA: Diagnosis present

## 2024-03-01 DIAGNOSIS — R109 Unspecified abdominal pain: Secondary | ICD-10-CM | POA: Diagnosis not present

## 2024-03-01 DIAGNOSIS — I130 Hypertensive heart and chronic kidney disease with heart failure and stage 1 through stage 4 chronic kidney disease, or unspecified chronic kidney disease: Secondary | ICD-10-CM | POA: Insufficient documentation

## 2024-03-01 DIAGNOSIS — R42 Dizziness and giddiness: Secondary | ICD-10-CM | POA: Diagnosis not present

## 2024-03-01 DIAGNOSIS — Z79899 Other long term (current) drug therapy: Secondary | ICD-10-CM | POA: Insufficient documentation

## 2024-03-01 DIAGNOSIS — I509 Heart failure, unspecified: Secondary | ICD-10-CM | POA: Insufficient documentation

## 2024-03-01 DIAGNOSIS — N189 Chronic kidney disease, unspecified: Secondary | ICD-10-CM | POA: Insufficient documentation

## 2024-03-01 DIAGNOSIS — M549 Dorsalgia, unspecified: Secondary | ICD-10-CM

## 2024-03-01 LAB — CBC
HCT: 29.3 % — ABNORMAL LOW (ref 36.0–46.0)
Hemoglobin: 9.5 g/dL — ABNORMAL LOW (ref 12.0–15.0)
MCH: 31.6 pg (ref 26.0–34.0)
MCHC: 32.4 g/dL (ref 30.0–36.0)
MCV: 97.3 fL (ref 80.0–100.0)
Platelets: 204 10*3/uL (ref 150–400)
RBC: 3.01 MIL/uL — ABNORMAL LOW (ref 3.87–5.11)
RDW: 13.8 % (ref 11.5–15.5)
WBC: 8.8 10*3/uL (ref 4.0–10.5)
nRBC: 0 % (ref 0.0–0.2)

## 2024-03-01 LAB — COMPREHENSIVE METABOLIC PANEL WITH GFR
ALT: 10 U/L (ref 0–44)
AST: 15 U/L (ref 15–41)
Albumin: 4 g/dL (ref 3.5–5.0)
Alkaline Phosphatase: 83 U/L (ref 38–126)
Anion gap: 13 (ref 5–15)
BUN: 67 mg/dL — ABNORMAL HIGH (ref 8–23)
CO2: 30 mmol/L (ref 22–32)
Calcium: 9.6 mg/dL (ref 8.9–10.3)
Chloride: 98 mmol/L (ref 98–111)
Creatinine, Ser: 1.5 mg/dL — ABNORMAL HIGH (ref 0.44–1.00)
GFR, Estimated: 38 mL/min — ABNORMAL LOW (ref >60)
Glucose, Bld: 129 mg/dL — ABNORMAL HIGH (ref 70–99)
Potassium: 3.7 mmol/L (ref 3.5–5.1)
Sodium: 141 mmol/L (ref 135–145)
Total Bilirubin: 0.3 mg/dL (ref 0.0–1.2)
Total Protein: 7.3 g/dL (ref 6.5–8.1)

## 2024-03-01 LAB — D-DIMER, QUANTITATIVE: D-Dimer, Quant: 0.9 ug{FEU}/mL — ABNORMAL HIGH (ref 0.00–0.50)

## 2024-03-01 LAB — PRO BRAIN NATRIURETIC PEPTIDE: Pro Brain Natriuretic Peptide: 874 pg/mL — ABNORMAL HIGH (ref <300.0)

## 2024-03-01 LAB — TROPONIN T, HIGH SENSITIVITY
Troponin T High Sensitivity: 23 ng/L — ABNORMAL HIGH (ref <19)
Troponin T High Sensitivity: 22 ng/L — ABNORMAL HIGH (ref <19)

## 2024-03-01 MED ORDER — METHOCARBAMOL 500 MG PO TABS: 500.0000 mg | ORAL_TABLET | Freq: Two times a day (BID) | ORAL | 0 refills | Status: DC

## 2024-03-01 MED ORDER — METHOCARBAMOL 500 MG PO TABS
500.0000 mg | ORAL_TABLET | Freq: Once | ORAL | Status: AC
  Administered 2024-03-01: 500 mg via ORAL
  Filled 2024-03-01: qty 1

## 2024-03-01 MED ORDER — IOHEXOL 350 MG/ML SOLN
75.0000 mL | Freq: Once | INTRAVENOUS | Status: AC | PRN
  Administered 2024-03-01: 75 mL via INTRAVENOUS

## 2024-03-01 MED ORDER — LIDOCAINE 5 % EX PTCH: 1.0000 | MEDICATED_PATCH | CUTANEOUS | 0 refills | Status: AC

## 2024-03-01 MED ORDER — LIDOCAINE 5 % EX PTCH
1.0000 | MEDICATED_PATCH | CUTANEOUS | Status: DC
  Administered 2024-03-01: 1 via TRANSDERMAL
  Filled 2024-03-01: qty 1

## 2024-03-01 NOTE — ED Triage Notes (Signed)
 In for eval of right flank/rib pain onset yesterday and worsened. Slight relief with ice pack. Denies nausea or vomiting.

## 2024-03-01 NOTE — Discharge Instructions (Addendum)
 As we discussed, your workup in the ER today was reassuring for acute findings.  Laboratory evaluation and CT imaging did not reveal any emergent cause of your symptoms.  Given this, I suspect that your pain is muscular in nature.  I have given you a prescription for a few doses of a muscle relaxer for you to take as prescribed as needed for management of breakthrough pain.  Do not drive or operate machinery while taking this medication as it can be sedating.  I have also given you a lidocaine  patch to wear over the area that is sore.  You can also take Tylenol  as needed.  Follow-up closely with your primary provider.  Return if development of any new or worsening symptoms.

## 2024-03-01 NOTE — ED Provider Notes (Signed)
 [place] EMERGENCY DEPARTMENT AT [facility] Provider Note   CSN: [id number] Arrival date & time: 03/01/24  1654     History  Chief Complaint  Patient presents with   Flank Pain    Bridget Lamb is a 68 y.o. female.  Patient with history of CHF, CKD, hyperlipidemia, hypertension, obesity presents today with complaints of right upper back pain. She states that same began yesterday and has worsened since. She states that she works at [employer] and did do some heavy lifting recently and therefore assumed that she had a pulled muscle.  However, today she states the pain was worse and felt like it was radiating into her right side which concerned her.  She also notes that she was walking and felt like she was more dyspneic than normal.  She also had an episode of lightheadedness.  She did not lose consciousness.  This was also concerning to her.  She presents with concern for same.  She is on diuresis for her CHF, states she is compliant with her regimen and does not feel like she is fluid overloaded.  Denies any cough or congestion.  No recent travel or surgeries.  No leg pain or leg swelling.  No nausea or vomiting.  No urinary symptoms.  The history is provided by the patient. No language interpreter was used.  Flank Pain       Home Medications Prior to Admission medications   Medication Sig Start Date End Date Taking? Authorizing Provider  acetaminophen  (TYLENOL ) 325 MG tablet Take 2 tablets (650 mg total) by mouth every 6 (six) hours as needed for mild pain (pain score 1-3) (or Fever >/= 101). 09/12/23   Aura Leeds Latif, DO  albuterol  (PROVENTIL  HFA;VENTOLIN  HFA) 108 (90 Base) MCG/ACT inhaler Inhale 2 puffs into the lungs every 4 (four) hours as needed for wheezing or shortness of breath. 09/01/18   Audria Leather, MD  Calcium  Carb-Cholecalciferol (CALCIUM  + D3 PO) Take 1 tablet by mouth 2 (two) times daily.    [provider]  diclofenac  Sodium (VOLTAREN ) 1  % GEL Apply 2 g topically 4 (four) times daily. 09/12/23   Aura Leeds Latif, DO  FeFum-FePoly-FA-B Cmp-C-Biot (INTEGRA PLUS ) CAPS Take 1 capsule by mouth daily. 03/12/21   [provider]  levothyroxine  (SYNTHROID ) 125 MCG tablet Take 125 mcg by mouth daily before breakfast. 05/25/21   [provider]  lidocaine  (LIDODERM ) 5 % Place 1 patch onto the skin daily. Remove & Discard patch within 12 hours or as directed by MD 09/12/23   Sheikh, Omair Latif, DO  loratadine  (CLARITIN ) 10 MG tablet Take 10 mg by mouth daily.    [provider]  metolazone  (ZAROXOLYN ) 2.5 MG tablet Take 1 tablet (2.5 mg total) by mouth as directed. Tuesday, Thursday, Saturday Patient taking differently: Take 2.5 mg by mouth See admin instructions. Take 2.5 mg by mouth on Tuesday, Thursday, and Saturday only 06/23/23 09/21/23  Patwardhan, Kaye Parsons, MD  olopatadine  (PATANOL) 0.1 % ophthalmic solution Place 1 drop into both eyes 2 (two) times daily.  03/26/20   [provider]  rosuvastatin  (CRESTOR ) 10 MG tablet Take 10 mg by mouth at bedtime. 11/28/17   [provider]  senna-docusate (SENOKOT-S) 8.6-50 MG tablet Take 1 tablet by mouth 2 (two) times daily as needed for moderate constipation. 02/26/20   Gonfa, Taye T, MD  sodium chloride  (OCEAN) 0.65 % SOLN nasal spray Place 1 spray into both nostrils as needed for congestion.  [provider]  torsemide  (DEMADEX ) 20 MG tablet Take 1 tablet (20 mg total) by mouth daily. 01/05/24   Patwardhan, Kaye Parsons, MD  valsartan  (DIOVAN ) 40 MG tablet Take 40 mg by mouth daily. 02/17/23   [provider]      Allergies    Procardia  [nifedipine ]    Review of Systems   Review of Systems  Genitourinary:  Positive for flank pain.  All other systems reviewed and are negative.   Physical Exam Updated Vital Signs BP (!) 136/112   Pulse 73   Temp 98.5 F (36.9 C)   Resp 14   Ht 5\' 4"  (1.626 m)   Wt 113.4 kg   LMP 10/22/2011    SpO2 97%   BMI 42.91 kg/m  Physical Exam Vitals and nursing note reviewed.  Constitutional:      General: She is not in acute distress.    Appearance: Normal appearance. She is normal weight. She is not ill-appearing, toxic-appearing or diaphoretic.  HENT:     Head: Normocephalic and atraumatic.  Cardiovascular:     Rate and Rhythm: Normal rate and regular rhythm.     Heart sounds: Normal heart sounds.  Pulmonary:     Effort: Pulmonary effort is normal. No respiratory distress.     Breath sounds: Normal breath sounds.  Abdominal:     General: Abdomen is flat.     Palpations: Abdomen is soft.     Tenderness: There is no abdominal tenderness.  Musculoskeletal:        General: Normal range of motion.     Cervical back: Normal range of motion.       Back:     Comments: Area of tenderness. Worsened with ROM. No overlying skin changes or deformity.  Skin:    General: Skin is warm and dry.  Neurological:     General: No focal deficit present.     Mental Status: She is alert.  Psychiatric:        Mood and Affect: Mood normal.        Behavior: Behavior normal.     ED Results / Procedures / Treatments   Labs (all labs ordered are listed, but only abnormal results are displayed) Labs Reviewed  CBC - Abnormal; Notable for the following components:      Result Value   RBC 3.01 (*)    Hemoglobin 9.5 (*)    HCT 29.3 (*)    All other components within normal limits  COMPREHENSIVE METABOLIC PANEL WITH GFR - Abnormal; Notable for the following components:   Glucose, Bld 129 (*)    BUN 67 (*)    Creatinine, Ser 1.50 (*)    GFR, Estimated 38 (*)    All other components within normal limits  PRO BRAIN NATRIURETIC PEPTIDE - Abnormal; Notable for the following components:   Pro Brain Natriuretic Peptide 874.0 (*)    All other components within normal limits  D-DIMER, QUANTITATIVE - Abnormal; Notable for the following components:   D-Dimer, Quant 0.90 (*)    All other components  within normal limits  TROPONIN T, HIGH SENSITIVITY - Abnormal; Notable for the following components:   Troponin T High Sensitivity 23 (*)    All other components within normal limits    EKG None  Radiology CT Angio Chest PE W and/or Wo Contrast Result Date: 03/01/2024 CLINICAL DATA:  Positive D-dimer and difficulty breathing EXAM: CT ANGIOGRAPHY CHEST WITH CONTRAST TECHNIQUE: Multidetector CT imaging of the chest was performed using the  standard protocol during bolus administration of intravenous contrast. Multiplanar CT image reconstructions and MIPs were obtained to evaluate the vascular anatomy. RADIATION DOSE REDUCTION: This exam was performed according to the departmental dose-optimization program which includes automated exposure control, adjustment of the mA and/or kV according to patient size and/or use of iterative reconstruction technique. CONTRAST:  75mL OMNIPAQUE  IOHEXOL  350 MG/ML SOLN COMPARISON:  09/10/2023 FINDINGS: Cardiovascular: Thoracic aorta demonstrates atherosclerotic calcifications without aneurysmal dilatation or dissection. The pulmonary artery shows a normal branching pattern bilaterally. No intraluminal filling defect to suggest pulmonary embolism is noted. Coronary calcifications are noted. Heart is at the upper limits of normal size. Mediastinum/Nodes: Thoracic inlet is within normal limits. No hilar or mediastinal adenopathy is noted. The esophagus as visualized is within normal limits. Lungs/Pleura: Lungs are clear. No pleural effusion or pneumothorax. Upper Abdomen: No acute abnormality. Musculoskeletal: No chest wall abnormality. No acute or significant osseous findings. Review of the MIP images confirms the above findings. IMPRESSION: No evidence of pulmonary emboli. No acute abnormality noted. Aortic Atherosclerosis (ICD10-I70.0). Electronically Signed   By: Violeta Grey M.D.   On: 03/01/2024 19:05    Procedures Procedures    Medications Ordered in ED Medications   lidocaine  (LIDODERM ) 5 % 1 patch (1 patch Transdermal Patch Applied 03/01/24 2003)  iohexol  (OMNIPAQUE ) 350 MG/ML injection 75 mL (75 mLs Intravenous Contrast Given 03/01/24 1814)    ED Course/ Medical Decision Making/ A&P                                 Medical Decision Making Amount and/or Complexity of Data Reviewed Labs: ordered. Radiology: ordered.  Risk Prescription drug management.   This patient is a 68 y.o. female who presents to the ED for concern of right upper back pain, this involves an extensive number of treatment options, and is a complaint that carries with it a high risk of complications and morbidity. The emergent differential diagnosis prior to evaluation includes, but is not limited to,  ACS/PE, AAA, MSK, pneumothorax, pneumonia, CHF . This is not an exhaustive differential.   Past Medical History / Co-morbidities / Social History:  has a past medical history of Allergic rhinitis, Anemia, Arthritis, Asthma, Cervix cancer (HCC), CHF (congestive heart failure) (HCC) (2004), Chronic kidney disease, Complication of anesthesia, Dyslipidemia, Dyspnea, Endometrial cancer (HCC), H/O echocardiogram (01/07/2004), History of radiation therapy (8/4-21-07/24/20), History of thyroid  cancer, Hypertension, Incarcerated ventral hernia (10/07/2013), Obesity, Class III, BMI 40-49.9 (morbid obesity) (10/09/2013), Osteopenia, PONV (postoperative nausea and vomiting), Sleep apnea, Umbilical hernia, Unspecified essential hypertension (10/09/2013), and Unspecified hypothyroidism (10/09/2013).  Additional history: Chart reviewed.  Physical Exam: Physical exam performed. The pertinent findings include: TTP right upper back area with no obvious deformity or overlying skin changes.  No other acute physical exam abnormalities  Lab Tests: I ordered, and personally interpreted labs.  The pertinent results include:  Hgb 9.5 consistent with baseline, troponin 23 --> 22. Creatinine 1.50 consistent with  baseline. BNP 874, d dimer 0.90   Imaging Studies: I ordered imaging studies including CTA PE. I independently visualized and interpreted imaging which showed   No evidence of pulmonary emboli.   No acute abnormality noted.   Aortic Atherosclerosis  I agree with the radiologist interpretation.   Medications: I ordered medication including robaxin , lidocaine  patch  for pain. Reevaluation of the patient after these medicines showed that the patient improved. I have reviewed the patients home medicines and have made adjustments  as needed.   Disposition: After consideration of the diagnostic results and the patients response to treatment, I feel that emergency department workup does not suggest an emergent condition requiring admission or immediate intervention beyond what has been performed at this time. The plan is: Discharge with Robaxin , close outpatient follow-up and return precautions.  I have advised patient not to drive or operate heavy machinery while taking Robaxin .  After above interventions, patient is feeling better.  She is able to walk around without any lightheadedness or dyspnea.  I do suspect her pain is due to a muscle strain.  Her workup is benign. Doubt AAA. Evaluation and diagnostic testing in the emergency department does not suggest an emergent condition requiring admission or immediate intervention beyond what has been performed at this time.  Plan for discharge with close PCP follow-up.  Patient is understanding and amenable with plan, educated on red flag symptoms that would prompt immediate return.  Patient discharged in stable condition.   I discussed this case with my attending physician Dr. Leighton Punches who cosigned this note including patient's presenting symptoms, physical exam, and planned diagnostics and interventions. Attending physician stated agreement with plan or made changes to plan which were implemented.    Final Clinical Impression(s) / ED Diagnoses Final  diagnoses:  Upper back pain on right side    Rx / DC Orders ED Discharge Orders          Ordered    methocarbamol  (ROBAXIN ) 500 MG tablet  2 times daily        03/01/24 2047    lidocaine  (LIDODERM ) 5 %  Every 24 hours        03/01/24 2047          An After Visit Summary was printed and given to the patient.     Fredna Jasper 03/01/24 2049    Lind Repine, MD 03/02/24 2238

## 2024-03-26 ENCOUNTER — Other Ambulatory Visit: Payer: Self-pay

## 2024-03-26 ENCOUNTER — Emergency Department (HOSPITAL_BASED_OUTPATIENT_CLINIC_OR_DEPARTMENT_OTHER): Admitting: Radiology

## 2024-03-26 ENCOUNTER — Emergency Department (HOSPITAL_BASED_OUTPATIENT_CLINIC_OR_DEPARTMENT_OTHER)
Admission: EM | Admit: 2024-03-26 | Discharge: 2024-03-26 | Disposition: A | Discharge status: Home / Self Care | Attending: Emergency Medicine | Admitting: Emergency Medicine

## 2024-03-26 ENCOUNTER — Encounter (HOSPITAL_BASED_OUTPATIENT_CLINIC_OR_DEPARTMENT_OTHER): Payer: Self-pay

## 2024-03-26 DIAGNOSIS — E039 Hypothyroidism, unspecified: Secondary | ICD-10-CM | POA: Insufficient documentation

## 2024-03-26 DIAGNOSIS — Z85850 Personal history of malignant neoplasm of thyroid: Secondary | ICD-10-CM | POA: Diagnosis not present

## 2024-03-26 DIAGNOSIS — M25512 Pain in left shoulder: Secondary | ICD-10-CM | POA: Insufficient documentation

## 2024-03-26 DIAGNOSIS — N189 Chronic kidney disease, unspecified: Secondary | ICD-10-CM | POA: Diagnosis not present

## 2024-03-26 DIAGNOSIS — Z8542 Personal history of malignant neoplasm of other parts of uterus: Secondary | ICD-10-CM | POA: Insufficient documentation

## 2024-03-26 DIAGNOSIS — I509 Heart failure, unspecified: Secondary | ICD-10-CM | POA: Diagnosis not present

## 2024-03-26 DIAGNOSIS — Z8541 Personal history of malignant neoplasm of cervix uteri: Secondary | ICD-10-CM | POA: Diagnosis not present

## 2024-03-26 DIAGNOSIS — J45909 Unspecified asthma, uncomplicated: Secondary | ICD-10-CM | POA: Diagnosis not present

## 2024-03-26 DIAGNOSIS — I130 Hypertensive heart and chronic kidney disease with heart failure and stage 1 through stage 4 chronic kidney disease, or unspecified chronic kidney disease: Secondary | ICD-10-CM | POA: Insufficient documentation

## 2024-03-26 MED ORDER — OXYCODONE HCL 5 MG PO TABS: 5.0000 mg | ORAL_TABLET | ORAL | 0 refills | Status: AC | PRN

## 2024-03-26 MED ORDER — FENTANYL CITRATE PF 50 MCG/ML IJ SOSY
25.0000 ug | PREFILLED_SYRINGE | Freq: Once | INTRAMUSCULAR | Status: AC
  Administered 2024-03-26: 25 ug via INTRAMUSCULAR
  Filled 2024-03-26: qty 1

## 2024-03-26 NOTE — ED Provider Notes (Signed)
 [place] EMERGENCY DEPARTMENT AT [facility] Provider Note   CSN: [id number] Arrival date & time: 03/26/24  6948     History  Chief Complaint  Patient presents with   Shoulder Pain    Bridget Lamb is a 68 y.o. female.  68 year old female presents today for left shoulder pain since Thursday.  Denies any injury.  No chest pain, shortness of breath, nausea, lightheadedness.  She has taken some leftover oxycodone  without any relief.  States she noticed some swelling to her left hand but denies any other complaints.  Compliant with all of her home medicines.  The history is provided by the patient. No language interpreter was used.       Home Medications Prior to Admission medications   Medication Sig Start Date End Date Taking? Authorizing Provider  acetaminophen  (TYLENOL ) 325 MG tablet Take 2 tablets (650 mg total) by mouth every 6 (six) hours as needed for mild pain (pain score 1-3) (or Fever >/= 101). 09/12/23   Aura Leeds Latif, DO  albuterol  (PROVENTIL  HFA;VENTOLIN  HFA) 108 (90 Base) MCG/ACT inhaler Inhale 2 puffs into the lungs every 4 (four) hours as needed for wheezing or shortness of breath. 09/01/18   Audria Leather, MD  Calcium  Carb-Cholecalciferol (CALCIUM  + D3 PO) Take 1 tablet by mouth 2 (two) times daily.    [provider]  diclofenac  Sodium (VOLTAREN ) 1 % GEL Apply 2 g topically 4 (four) times daily. 09/12/23   Aura Leeds Latif, DO  FeFum-FePoly-FA-B Cmp-C-Biot (INTEGRA PLUS ) CAPS Take 1 capsule by mouth daily. 03/12/21   [provider]  levothyroxine  (SYNTHROID ) 125 MCG tablet Take 125 mcg by mouth daily before breakfast. 05/25/21   [provider]  lidocaine  (LIDODERM ) 5 % Place 1 patch onto the skin daily. Remove & Discard patch within 12 hours or as directed by MD 03/01/24   Smoot, Genevive Ket, PA-C  loratadine  (CLARITIN ) 10 MG tablet Take 10 mg by mouth daily.    [provider]  methocarbamol  (ROBAXIN ) 500 MG  tablet Take 1 tablet (500 mg total) by mouth 2 (two) times daily. 03/01/24   Smoot, Genevive Ket, PA-C  metolazone  (ZAROXOLYN ) 2.5 MG tablet Take 1 tablet (2.5 mg total) by mouth as directed. Tuesday, Thursday, Saturday Patient taking differently: Take 2.5 mg by mouth See admin instructions. Take 2.5 mg by mouth on Tuesday, Thursday, and Saturday only 06/23/23 09/21/23  Patwardhan, Kaye Parsons, MD  olopatadine  (PATANOL) 0.1 % ophthalmic solution Place 1 drop into both eyes 2 (two) times daily.  03/26/20   [provider]  rosuvastatin  (CRESTOR ) 10 MG tablet Take 10 mg by mouth at bedtime. 11/28/17   [provider]  senna-docusate (SENOKOT-S) 8.6-50 MG tablet Take 1 tablet by mouth 2 (two) times daily as needed for moderate constipation. 02/26/20   Gonfa, Taye T, MD  sodium chloride  (OCEAN) 0.65 % SOLN nasal spray Place 1 spray into both nostrils as needed for congestion.    [provider]  torsemide  (DEMADEX ) 20 MG tablet Take 1 tablet (20 mg total) by mouth daily. 01/05/24   Patwardhan, Kaye Parsons, MD  valsartan  (DIOVAN ) 40 MG tablet Take 40 mg by mouth daily. 02/17/23   [provider]      Allergies    Procardia  [nifedipine ]    Review of Systems   Review of Systems  Constitutional:  Negative for fever.  Respiratory:  Negative for shortness of breath.   Cardiovascular:  Negative for chest pain, palpitations and leg swelling.  Gastrointestinal:  Negative for abdominal pain.  Musculoskeletal:  Positive for arthralgias.  Neurological:  Negative for light-headedness.  All other systems reviewed and are negative.   Physical Exam Updated Vital Signs BP (!) 129/42 (BP Location: Right Arm)   Pulse 73   Temp 98.8 F (37.1 C) (Oral)   Resp 18   Ht 5\' 4"  (1.626 m)   Wt 113.4 kg   LMP 10/22/2011   SpO2 99%   BMI 42.91 kg/m  Physical Exam Vitals and nursing note reviewed.  Constitutional:      General: She is not in acute distress.    Appearance: Normal appearance. She  is not ill-appearing.  HENT:     Head: Normocephalic and atraumatic.     Nose: Nose normal.  Eyes:     Conjunctiva/sclera: Conjunctivae normal.  Pulmonary:     Effort: Pulmonary effort is normal. No respiratory distress.  Musculoskeletal:        General: No deformity. Normal range of motion.     Comments: Cervical spine without tenderness to palpation.  There is tenderness palpation over the left shoulder joint.  Left elbow with good range of motion and without tenderness to palpation.  Left wrist with good range of motion and without tenderness.  All digits with good range of motion and sensation. Certain range of motion of the left shoulder do reproduce the pain such as flexion, abduction, extension.  No overlying erythema.  Skin:    Findings: No rash.  Neurological:     Mental Status: She is alert.     ED Results / Procedures / Treatments   Labs (all labs ordered are listed, but only abnormal results are displayed) Labs Reviewed - No data to display  EKG None  Radiology No results found.  Procedures Procedures    Medications Ordered in ED Medications  fentaNYL  (SUBLIMAZE ) injection 25 mcg (25 mcg Intramuscular Given 03/26/24 6295)    ED Course/ Medical Decision Making/ A&P                                 Medical Decision Making Amount and/or Complexity of Data Reviewed Radiology: ordered.  Risk Prescription drug management.   Medical Decision Making / ED Course   This patient presents to the ED for concern of left shoulder pain, this involves an extensive number of treatment options, and is a complaint that carries with it a high risk of complications and morbidity.  The differential diagnosis includes arthritis, fracture, strain, atypical ACS  MDM: 68 year old female presents today for concern of left shoulder pain since Thursday.  No other anginal symptoms.  EKG obtained without evidence of acute ischemic change. Left shoulder x-ray shows arthritis  changes. Sling provided along with orthopedic referral and a short course of pain medicine. No suspicion for septic joint at this time.  Has good range of motion able to tolerate palpation without tachycardia and afebrile. Discharged in stable condition. Strict return precautions discussed. Patient voices understanding and is in agreement with plan. Lab Tests: -I ordered, reviewed, and interpreted labs.   The pertinent results include:   Labs Reviewed - No data to display    EKG  EKG Interpretation Date/Time:    Ventricular Rate:    PR Interval:    QRS Duration:    QT Interval:    QTC Calculation:   R Axis:      Text Interpretation:  Imaging Studies ordered: I ordered imaging studies including left shoulder x-ray I independently visualized and interpreted imaging. I agree with the radiologist interpretation   Medicines ordered and prescription drug management: Meds ordered this encounter  Medications   fentaNYL  (SUBLIMAZE ) injection 25 mcg    -I have reviewed the patients home medicines and have made adjustments as needed  Reevaluation: After the interventions noted above, I reevaluated the patient and found that they have :improved  Co morbidities that complicate the patient evaluation  Past Medical History:  Diagnosis Date   Allergic rhinitis    Anemia    hematology, Dr. Marguerita Shih, last 01/2011   Arthritis    Asthma    Cervix cancer (HCC)    CHF (congestive heart failure) (HCC) 2004   Chronic kidney disease    protein in urine   Complication of anesthesia    Dyslipidemia    Dyspnea    on excertion   Endometrial cancer (HCC)    H/O echocardiogram 01/07/2004   mild LVH, nl LV systolic function, EF 60%, left atrial enlargement, mildly thickened aortic and mitral valves   History of radiation therapy 8/4-21-07/24/20   Pelvis IMRT and HDR, Dr. Retta Caster   History of thyroid  cancer    Hypertension    Incarcerated ventral hernia 10/07/2013    Obesity, Class III, BMI 40-49.9 (morbid obesity) 10/09/2013   Osteopenia    PONV (postoperative nausea and vomiting)    Sleep apnea    Umbilical hernia    Unspecified essential hypertension 10/09/2013   Unspecified hypothyroidism 10/09/2013      Dispostion: Discharged in stable condition.  Return precaution discussed.  Patient voices understanding and is in agreement with plan.  Final Clinical Impression(s) / ED Diagnoses Final diagnoses:  Acute pain of left shoulder    Rx / DC Orders ED Discharge Orders          Ordered    oxyCODONE  (ROXICODONE ) 5 MG immediate release tablet  Every 4 hours PRN        03/26/24 1155              Lucina Sabal, PA-C 03/26/24 1158    Lind Repine, MD 03/26/24 [phone]

## 2024-03-26 NOTE — ED Notes (Signed)
Pt ambulated to bathroom with no acute distress

## 2024-03-26 NOTE — Discharge Instructions (Signed)
 Your x-ray did not show any concerns.  EKG was without any evidence of heart attack.  You did not have any other symptoms of heart attack.  You were given a sling, pain medication, and orthopedic referral.  Follow-up with your orthopedist.  Return for any emergent symptoms.  Take 1000 mg of Tylenol  every 6 hours.

## 2024-03-26 NOTE — ED Triage Notes (Signed)
 Patient comes in with left shoulder pain since Thursday. No known injury. She also reports she cannot raise it up or back. She took a leftover oxycodone  from a prior surgery and she states it didn't help. She reports some numbness and a sore pointer finger. Patient says she has CHF, but denies hx of MI.

## 2024-03-27 ENCOUNTER — Other Ambulatory Visit: Payer: Self-pay | Admitting: Cardiology

## 2024-03-27 DIAGNOSIS — I5032 Chronic diastolic (congestive) heart failure: Secondary | ICD-10-CM

## 2024-04-04 ENCOUNTER — Encounter: Payer: Self-pay | Admitting: Cardiology

## 2024-04-04 ENCOUNTER — Ambulatory Visit: Attending: Cardiology | Admitting: Cardiology

## 2024-04-04 VITALS — BP 137/84 | HR 76 | Ht 64.0 in | Wt 238.0 lb

## 2024-04-04 DIAGNOSIS — I5032 Chronic diastolic (congestive) heart failure: Secondary | ICD-10-CM

## 2024-04-04 DIAGNOSIS — I10 Essential (primary) hypertension: Secondary | ICD-10-CM | POA: Diagnosis not present

## 2024-04-04 NOTE — Patient Instructions (Signed)
 Follow-Up: At [facility], you and your health needs are our priority.  As part of our continuing mission to provide you with exceptional heart care, our providers are all part of one team.  This team includes your primary Cardiologist (physician) and Advanced Practice Providers or APPs (Physician Assistants and Nurse Practitioners) who all work together to provide you with the care you need, when you need it.  Your next appointment:   6 month(s)  Provider:   One of our Advanced Practice Providers (APPs): Melita Springer, PA-C  Friddie Jetty, NP Evaline Hill, NP  Theotis Flake, PA-C Lawana Pray, NP  Willis Harter, PA-C Lovette Rud, PA-C  [place], PA-C Ernest Dick, NP  Marlana Silvan, NP Marcie Sever, PA-C  Laquita Plant, PA-C    Dayna Dunn, PA-C  Scott Weaver, PA-C Palmer Bobo, NP Katlyn West, NP Callie Goodrich, PA-C  Evan Williams, PA-C Sheng Haley, PA-C  Xika Zhao, NP Kathleen Johnson, PA-C   Then, Cody Das, MD will plan to see you again in 1 year(s).

## 2024-04-04 NOTE — Progress Notes (Signed)
 Cardiology Office Note:  .   Date:  04/04/2024  ID:  Bridget Lamb, DOB Nov 01, 1955, MRN [id number] PCP: Jearlean Mince, PA-C  [facility] Providers Cardiologist:  Fransico Ivy, MD PCP: Jearlean Mince, PA-C  Chief Complaint  Patient presents with   Congestive Heart Failure   Follow-up      History of Present Illness: .    Bridget Lamb is a 68 y.o. female with hypertension, hyperlipidemia, obesity, OSA, HFpEF, asthma, CKD stage 3a/b, h/o endometrial cancer   Patient is doing fairly well.  She has had 2 ER visits with muscle and flank pain.  Workup during last ER visit showed creatinine at 1.5, improved proBNP.  High sensitive troponin was mildly elevated, but there was no chest pain.  Swelling is stable.  Dyspnea states that he had NYHA class II.  Vitals:   04/04/24 0841  BP: 137/84  Pulse: 76  SpO2: 95%      ROS:  Review of Systems  Cardiovascular:  Positive for dyspnea on exertion. Negative for chest pain, leg swelling, palpitations and syncope.     Studies Reviewed: Aaron Aas        EKG 01/05/2024: Sinus rhythm with 1st degree A-V block with occasional Premature ventricular complexes When compared with ECG of 10-Sep-2023 12:35, Premature ventricular complexes are now Present Premature atrial complexes are no longer Present PR interval has increased Questionable change in QRS axis Nonspecific T wave abnormality no longer evident in Inferior leads Nonspecific T wave abnormality, improved in Anterior leads  Labs 5/8//2025: Cr 1.5 ProBNP 874 Trop HS 23, 22  Labs 01/26/2024: Cr 1.4 ProBNP 1671   Echocardiogram 01/2024:  1. Left ventricular ejection fraction, by estimation, is 60 to 65%. The  left ventricle has normal function. The left ventricle has no regional  wall motion abnormalities. Left ventricular diastolic parameters are  consistent with Grade I diastolic  dysfunction (impaired relaxation). The average left ventricular  global  longitudinal strain is -20.9 %. The global longitudinal strain is normal.   2. Right ventricular systolic function is normal. The right ventricular  size is normal.   3. Left atrial size was mild to moderately dilated.   4. Right atrial size was mild to moderately dilated.   5. The mitral valve is normal in structure. Trivial mitral valve  regurgitation. No evidence of mitral stenosis.   6. The aortic valve is tricuspid. There is mild calcification of the  aortic valve. Aortic valve regurgitation is not visualized. No aortic  stenosis is present.   7. The inferior vena cava is normal in size with greater than 50%  respiratory variability, suggesting right atrial pressure of 3 mmHg.      Physical Exam:   Physical Exam Vitals and nursing note reviewed.  Constitutional:      General: She is not in acute distress. Neck:     Vascular: No JVD.  Cardiovascular:     Rate and Rhythm: Normal rate and regular rhythm.     Heart sounds: Normal heart sounds. No murmur heard. Pulmonary:     Effort: Pulmonary effort is normal.     Breath sounds: Normal breath sounds. No wheezing or rales.  Musculoskeletal:     Right lower leg: Edema (Trace, prominent varicosities) present.     Left lower leg: Edema (Trace, prominent varicosities) present.      VISIT DIAGNOSES:   ICD-10-CM   1. Chronic heart failure with preserved ejection fraction (HCC)  I50.32     2.  Primary hypertension  I10          ASSESSMENT AND PLAN: .    Bridget Lamb is a 68 y.o. female with hypertension, hyperlipidemia, obesity, OSA, HFpEF, asthma, CKD stage 3a/b, h/o endometrial cancer   HFpEF: EF 55-60%, grade II DD, RVSP 54 mmHg (echocardiogram 10/2022) NYHA II dyspnea symptoms Continue valsartan  40 mg daily, metolazone  2.5 mg every other day, torsemide -takes 20 mg daily.   Hypertension: Well controlled.         F/u in 3 months  Signed, Cody Das, MD

## 2024-07-11 ENCOUNTER — Encounter: Payer: Self-pay | Admitting: Gynecologic Oncology

## 2024-07-13 ENCOUNTER — Inpatient Hospital Stay: Payer: BC Managed Care – PPO | Attending: Gynecologic Oncology | Admitting: Gynecologic Oncology

## 2024-07-13 ENCOUNTER — Telehealth: Payer: Self-pay | Admitting: *Deleted

## 2024-07-13 ENCOUNTER — Encounter: Payer: Self-pay | Admitting: Oncology

## 2024-07-13 ENCOUNTER — Encounter: Payer: Self-pay | Admitting: Gynecologic Oncology

## 2024-07-13 VITALS — BP 115/47 | HR 76 | Temp 98.4°F | Resp 20 | Wt 239.2 lb

## 2024-07-13 DIAGNOSIS — C541 Malignant neoplasm of endometrium: Secondary | ICD-10-CM

## 2024-07-13 DIAGNOSIS — Z923 Personal history of irradiation: Secondary | ICD-10-CM | POA: Insufficient documentation

## 2024-07-13 DIAGNOSIS — Z8542 Personal history of malignant neoplasm of other parts of uterus: Secondary | ICD-10-CM | POA: Insufficient documentation

## 2024-07-13 DIAGNOSIS — Z08 Encounter for follow-up examination after completed treatment for malignant neoplasm: Secondary | ICD-10-CM | POA: Insufficient documentation

## 2024-07-13 DIAGNOSIS — Z90710 Acquired absence of both cervix and uterus: Secondary | ICD-10-CM | POA: Insufficient documentation

## 2024-07-13 DIAGNOSIS — Z9079 Acquired absence of other genital organ(s): Secondary | ICD-10-CM | POA: Diagnosis not present

## 2024-07-13 DIAGNOSIS — Z90722 Acquired absence of ovaries, bilateral: Secondary | ICD-10-CM | POA: Insufficient documentation

## 2024-07-13 NOTE — Telephone Encounter (Signed)
 Per Dr Viktoria MRI ordered on 10/1 at 12:30 pm. Patient aware of appt date/time

## 2024-07-13 NOTE — Progress Notes (Signed)
 Requested p53 and MLH1 promoter hypermethylation on accession [id number] with [facility] pathology per Dr. Viktoria.

## 2024-07-13 NOTE — Patient Instructions (Signed)
 Plan to follow up in one year with Bridget Gallant NP or sooner if needed.  Symptoms to report to your health care team include vaginal bleeding, rectal bleeding, bloating, weight loss without effort, new and persistent pain, new and  persistent fatigue, new leg swelling, new masses (i.e., bumps in your neck or groin), new and persistent cough, new and persistent nausea and vomiting, change in bowel or bladder habits, and any other concerns.

## 2024-07-13 NOTE — Progress Notes (Signed)
 Gynecologic Oncology Return Clinic Visit  07/13/24  Reason for Visit: surveillance in the setting of a history of endometrial cancer   Treatment History: Oncology History Overview Note  CA-125  10/12/19: 48.9  MSI stable   Endometrial cancer (HCC)  09/19/2019 Initial Diagnosis   Endometrial cancer (HCC)   09/19/2019 Initial Biopsy   EMB: EMCA with clear cell features   10/17/2019 Imaging   CT A/P: Mild endometrial thickening in this patient with known endometrial cancer. No evidence of metastatic disease in the abdomen/pelvis.  CXR 12/28: No evidence of acute cardiopulmonary abnormality.   10/24/2019 Cancer Staging   Staging form: Corpus Uteri - Carcinoma and Carcinosarcoma, AJCC 8th Edition - Clinical stage from 10/24/2019: FIGO Stage IA (cT1a, cN0(sn), cM0) - Signed by Viktoria Comer SAUNDERS, MD on 05/01/2020   10/25/2019 Surgery   TRH/BSO, bil SLN, repair of sulcal tear Findings: On EUA, small cervix, moderately mobile uterus, no adnexal masses.  On intra-abdominal entry, adhesions from the omentum to the anterior wall at the patient's prior hernia repair as well as to the lateral anterior wall bilaterally.  Right aspect of the liver and diaphragm of secured by adhesions.  Left liver edge, diaphragm and stomach normal in appearance.  Uterus 10 cm in bulbous.  Posterior cul-de-sac completely obliterated and bilateral fallopian tubes and adnexa adherent to posterior uterus and sigmoid mesentery.  Fallopian tubes and left ovary somewhat concerning for involvement by tumor versus prior history of pelvic infection or endometriosis.  Mapping successful to bilateral external iliac sentinel lymph nodes.  Small and large bowel normal appearing.  No intra-abdominal evidence of disease.  Significant right sulcal tear noted after delivery of specimen through the vagina, repaired robotically as well as vaginally.   10/25/2019 Cancer Staging   Endometrial cancer, Stage IA, grade 1  endometrioid MI <50%, no LVSI, negative SLNs   05/01/2020 Relapse/Recurrence   A. VAGINAL CUFF, BIOPSY:  - Adenocarcinoma, see comment.   COMMENT:   The adenocarcinoma is similar in appearance to the patient's prior  endometrioid adenocarcinoma ([id number], reviewed).    05/05/2020 Imaging   CT C/A/P: No acute intra-abdominal or pelvic pathology.   05/28/2020 - 07/24/2020 Radiation Therapy    Radiation Treatment Dates: 05/28/2020 through 07/24/2020 Site Technique Total Dose (Gy) Dose per Fx (Gy) Completed Fx Beam Energies  Pelvis: Pelvis IMRT 45/45 1.8 25/25 6X  Vagina: Pelvis_Bst HDR-brachy 24/24 6 4/4 Ir-192        Interval History: Doing well.  Having some arthritis checked in multiple joints.  Denies any vaginal bleeding.  Denies any abdominal or pelvic pain.  Reports baseline bowel bladder function.  Past Medical/Surgical History: Past Medical History:  Diagnosis Date   Allergic rhinitis    Anemia    hematology, Dr. Sherrod, last 01/2011   Arthritis    Asthma    Cervix cancer (HCC)    CHF (congestive heart failure) (HCC) 2004   Chronic kidney disease    protein in urine   Complication of anesthesia    Dyslipidemia    Dyspnea    on excertion   Endometrial cancer (HCC)    H/O echocardiogram 01/07/2004   mild LVH, nl LV systolic function, EF 60%, left atrial enlargement, mildly thickened aortic and mitral valves   History of radiation therapy 8/4-21-07/24/20   Pelvis IMRT and HDR, Dr. Lynwood Nasuti   History of thyroid  cancer    Hypertension    Incarcerated ventral hernia 10/07/2013   Obesity, Class III, BMI 40-49.9 (morbid obesity) 10/09/2013  Osteopenia    PONV (postoperative nausea and vomiting)    Sleep apnea    Umbilical hernia    Unspecified essential hypertension 10/09/2013   Unspecified hypothyroidism 10/09/2013    Past Surgical History:  Procedure Laterality Date   BONE MARROW BIOPSY     CARDIAC CATHETERIZATION  2005   CHOLECYSTECTOMY  2003    COLONOSCOPY  2008   COLONOSCOPY  08/17/2022   ROBOTIC ASSISTED TOTAL HYSTERECTOMY WITH BILATERAL SALPINGO OOPHERECTOMY N/A 10/25/2019   Procedure: XI ROBOTIC ASSISTED TOTAL HYSTERECTOMY WITH BILATERAL SALPINGO OOPHORECTOMY;  Surgeon: Viktoria Comer SAUNDERS, MD;  Location: [facility];  Service: Gynecology;  Laterality: N/A;   SENTINEL NODE BIOPSY N/A 10/25/2019   Procedure: SENTINEL LYMPH NODE BIOPSY;  Surgeon: Viktoria Comer SAUNDERS, MD;  Location: [facility];  Service: Gynecology;  Laterality: N/A;   THYROID  SURGERY     partial thyroidectomy   TOTAL THYROIDECTOMY  2009   VENTRAL HERNIA REPAIR N/A 10/07/2013   Procedure: HERNIA REPAIR VENTRAL ADULT;  Surgeon: Lynwood MALVA Pina, MD;  Location: [facility] OR;  Service: General;  Laterality: N/A;    Family History  Problem Relation Age of Onset   Hypertension Mother    Pancreatic cancer Mother    Lung cancer Father    Breast cancer Maternal Aunt    Ovarian cancer Neg Hx    Endometrial cancer Neg Hx    Colon cancer Neg Hx    Rectal cancer Neg Hx    Stomach cancer Neg Hx     Social History   Socioeconomic History   Marital status: Divorced    Spouse name: Not on file   Number of children: 1   Years of education: Not on file   Highest education level: Not on file  Occupational History   Not on file  Tobacco Use   Smoking status: Never   Smokeless tobacco: Never  Vaping Use   Vaping status: Never Used  Substance and Sexual Activity   Alcohol use: No   Drug use: No   Sexual activity: Not Currently  Other Topics Concern   Not on file  Social History Narrative   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[employer], is on her feet for 8-hour shifts   Social Drivers of Corporate investment banker Strain: Low Risk  (12/06/2023)   Received from [facility]   Overall Financial Resource Strain (CARDIA)    Difficulty of Paying Living Expenses: Not very hard  Food Insecurity: No Food Insecurity (12/06/2023)   Received from [facility]   Hunger Vital Sign     Within the past 12 months, you worried that your food would run out before you got the money to buy more.: Never true    Within the past 12 months, the food you bought just didn't last and you didn't have money to get more.: Never true  Transportation Needs: No Transportation Needs (12/06/2023)   Received from [facility] - Transportation    Lack of Transportation (Medical): No    Lack of Transportation (Non-Medical): No  Physical Activity: Unknown (12/06/2023)   Received from [facility]   Exercise Vital Sign    On average, how many days per week do you engage in moderate to strenuous exercise (like a brisk walk)?: 0 days    Minutes of Exercise per Session: Not on file  Stress: No Stress Concern Present (12/06/2023)   Received from [facility] - Occupational Stress Questionnaire  Feeling of Stress : Only a little  Social Connections: Moderately Integrated (12/06/2023)   Received from [facility]   Social Network    How would you rate your social network (family, work, friends)?: Adequate participation with social networks    Current Medications:  Current Outpatient Medications:    acetaminophen  (TYLENOL ) 325 MG tablet, Take 2 tablets (650 mg total) by mouth every 6 (six) hours as needed for mild pain (pain score 1-3) (or Fever >/= 101)., Disp: 20 tablet, Rfl: 0   albuterol  (PROVENTIL  HFA;VENTOLIN  HFA) 108 (90 Base) MCG/ACT inhaler, Inhale 2 puffs into the lungs every 4 (four) hours as needed for wheezing or shortness of breath., Disp: 1 Inhaler, Rfl: 0   Calcium  Carb-Cholecalciferol (CALCIUM  + D3 PO), Take 1 tablet by mouth 2 (two) times daily., Disp: , Rfl:    diclofenac  Sodium (VOLTAREN ) 1 % GEL, Apply 2 g topically 4 (four) times daily., Disp: 50 g, Rfl: 0   FeFum-FePoly-FA-B Cmp-C-Biot (INTEGRA PLUS ) CAPS, Take 1 capsule by mouth daily., Disp: , Rfl:    levothyroxine  (SYNTHROID ) 125 MCG tablet, Take 125 mcg by mouth daily  before breakfast., Disp: , Rfl:    lidocaine  (LIDODERM ) 5 %, Place 1 patch onto the skin daily. Remove & Discard patch within 12 hours or as directed by MD (Patient taking differently: Place 1 patch onto the skin daily as needed. Remove & Discard patch within 12 hours or as directed by MD), Disp: 30 patch, Rfl: 0   loratadine  (CLARITIN ) 10 MG tablet, Take 10 mg by mouth daily., Disp: , Rfl:    metolazone  (ZAROXOLYN ) 2.5 MG tablet, TAKE 1 TABLET BY MOUTH ONCE DAILY ON  TUESDAY,  THURSDAY,  AND  SATURDAY (Patient taking differently: Three times a week, Tuesday, Thursday, Saturday), Disp: 36 tablet, Rfl: 3   olopatadine  (PATANOL) 0.1 % ophthalmic solution, Place 1 drop into both eyes 2 (two) times daily. , Disp: , Rfl:    oxyCODONE  (ROXICODONE ) 5 MG immediate release tablet, Take 1 tablet (5 mg total) by mouth every 4 (four) hours as needed for severe pain (pain score 7-10)., Disp: 10 tablet, Rfl: 0   rosuvastatin  (CRESTOR ) 10 MG tablet, Take 10 mg by mouth at bedtime., Disp: , Rfl:    senna-docusate (SENOKOT-S) 8.6-50 MG tablet, Take 1 tablet by mouth 2 (two) times daily as needed for moderate constipation., Disp:  , Rfl:    sodium chloride  (OCEAN) 0.65 % SOLN nasal spray, Place 1 spray into both nostrils as needed for congestion., Disp: , Rfl:    torsemide  (DEMADEX ) 20 MG tablet, Take 1 tablet (20 mg total) by mouth daily., Disp: 90 tablet, Rfl: 1   valsartan  (DIOVAN ) 40 MG tablet, Take 40 mg by mouth daily., Disp: , Rfl:   Review of Systems: + Shortness of breath (baseline), joint pain, back pain, numbness, easy bruising/bleeding Denies appetite changes, fevers, chills, fatigue, unexplained weight changes. Denies hearing loss, neck lumps or masses, mouth sores, ringing in ears or voice changes. Denies cough or wheezing.  Denies chest pain or palpitations. Denies leg swelling. Denies abdominal distention, pain, blood in stools, constipation, diarrhea, nausea, vomiting, or early satiety. Denies pain  with intercourse, dysuria, frequency, hematuria or incontinence. Denies hot flashes, pelvic pain, vaginal bleeding or vaginal discharge.   Denies muscle pain/cramps. Denies itching, rash, or wounds. Denies dizziness, headaches or seizures. Denies swollen lymph nodes or glands. Denies anxiety, depression, confusion, or decreased concentration.  Physical Exam: BP (!) 115/47 (BP Location: Right Arm, Patient Position: Sitting)  Pulse 76   Temp 98.4 F (36.9 C) (Oral)   Resp 20   Wt 239 lb 3.2 oz (108.5 kg)   LMP 10/22/2011   SpO2 95%   BMI 41.06 kg/m  General: Alert, oriented, no acute distress. HEENT: Normocephalic, atraumatic, sclera anicteric. Chest: Unlabored breathing on room air. Cardiovascular: Regular rate and rhythm. Breast: Left breast there is an approximately 2 x 3 cm red macule, minimally raised, nontender to palpation. Abdomen: Obese, soft, nontender.  Normoactive bowel sounds.  No masses or hepatosplenomegaly appreciated.  Well-healed incisions. Extremities: Grossly normal range of motion.  Warm, well perfused.  Trace edema bilaterally. Skin: No rashes or lesions noted. Lymphatics: No cervical, supraclavicular, or inguinal adenopathy. GU: Normal appearing external genitalia without erythema, excoriation, or lesions.  Speculum exam reveals continued small area of mild erythema (no change in size) along the anterior vaginal mucosa, previously biopsied.  Radiation changes noted.  Bimanual exam reveals cuff is smooth, some narrowing of the upper vagina noted.  On rectovaginal exam, there is some mild thickening along the posterior vagina distally.  This area was again examined on speculum exam and looks consistent with normal vaginal rugation.  Laboratory & Radiologic Studies: None new  Assessment & Plan: Bridget Lamb is a 68 y.o. woman with early stage uterine cancer who presented in July 2021 with vaginal bleeding found to have a recurrence at the vaginal cuff  treated with RT completed in 06/2020. MMRd (loss MLH1 and PMS2)   Patient continues to do well.  Very mild thickening along the posterior vagina that visually is consistent with normal vaginal rugation.  Given history of recurrent cancer, discussed getting pelvic MRI to rule out any evidence of recurrent disease (despite no visual abnormalities).   I encouraged her to continue using her vaginal dilator on a semiregular basis given some mild agglutination at vaginal apex.   We will continued with visits every 6 months alternating between our office and Dr. Colton. We will see her back in 06/2025. We reviewed signs and symptoms that would be concerning for cancer recurrence and I stressed the importance of calling if she develops any of these before her next visit.    22 minutes of total time was spent for this patient encounter, including preparation, face-to-face counseling with the patient and coordination of care, and documentation of the encounter.  Comer Dollar, MD  Division of Gynecologic Oncology  Department of Obstetrics and Gynecology  [facility]

## 2024-07-15 LAB — SURGICAL PATHOLOGY

## 2024-07-24 ENCOUNTER — Telehealth: Payer: Self-pay | Admitting: Oncology

## 2024-07-24 ENCOUNTER — Encounter (HOSPITAL_COMMUNITY): Payer: Self-pay | Admitting: Gynecologic Oncology

## 2024-07-24 NOTE — Telephone Encounter (Signed)
 Called Bridget Lamb regarding her call about the MLH1 Promoter Hypermethylation results.  Advised it was done on her surgery from 10/25/2019 due to abnormal MMR results.  Discussed the MLH1 results are detected which mean that she does not need genetic testing. She verbalized understanding.

## 2024-07-25 ENCOUNTER — Ambulatory Visit (HOSPITAL_COMMUNITY)
Admission: RE | Admit: 2024-07-25 | Discharge: 2024-07-25 | Disposition: A | Discharge status: Home / Self Care | Source: Ambulatory Visit | Attending: Gynecologic Oncology | Admitting: Gynecologic Oncology

## 2024-07-25 DIAGNOSIS — C541 Malignant neoplasm of endometrium: Secondary | ICD-10-CM | POA: Insufficient documentation

## 2024-07-25 MED ORDER — GADOBUTROL 1 MMOL/ML IV SOLN
10.0000 mL | Freq: Once | INTRAVENOUS | Status: AC | PRN
  Administered 2024-07-25: 10 mL via INTRAVENOUS

## 2024-07-26 ENCOUNTER — Ambulatory Visit: Payer: Self-pay | Admitting: Gynecologic Oncology

## 2024-08-02 ENCOUNTER — Other Ambulatory Visit: Payer: Self-pay | Admitting: Cardiology

## 2024-08-02 DIAGNOSIS — I5032 Chronic diastolic (congestive) heart failure: Secondary | ICD-10-CM

## 2024-08-02 DIAGNOSIS — I10 Essential (primary) hypertension: Secondary | ICD-10-CM

## 2025-01-17 ENCOUNTER — Ambulatory Visit: Admitting: Radiation Oncology

## 2025-07-23 ENCOUNTER — Ambulatory Visit: Admitting: Gynecologic Oncology
